# Patient Record
Sex: Female | Born: 1935
Health system: Southern US, Community
[De-identification: ages and names within clinical notes are randomized; demographics above are authoritative.]

## PROBLEM LIST (undated history)

## (undated) DIAGNOSIS — K219 Gastro-esophageal reflux disease without esophagitis: Secondary | ICD-10-CM

## (undated) DIAGNOSIS — C801 Malignant (primary) neoplasm, unspecified: Secondary | ICD-10-CM

## (undated) DIAGNOSIS — Z8619 Personal history of other infectious and parasitic diseases: Secondary | ICD-10-CM

## (undated) DIAGNOSIS — R0602 Shortness of breath: Secondary | ICD-10-CM

## (undated) DIAGNOSIS — Z8669 Personal history of other diseases of the nervous system and sense organs: Secondary | ICD-10-CM

## (undated) DIAGNOSIS — M2540 Effusion, unspecified joint: Secondary | ICD-10-CM

## (undated) DIAGNOSIS — M792 Neuralgia and neuritis, unspecified: Secondary | ICD-10-CM

## (undated) DIAGNOSIS — R51 Headache: Secondary | ICD-10-CM

## (undated) DIAGNOSIS — M2550 Pain in unspecified joint: Secondary | ICD-10-CM

## (undated) DIAGNOSIS — L853 Xerosis cutis: Secondary | ICD-10-CM

## (undated) DIAGNOSIS — M1990 Unspecified osteoarthritis, unspecified site: Secondary | ICD-10-CM

## (undated) DIAGNOSIS — G629 Polyneuropathy, unspecified: Secondary | ICD-10-CM

## (undated) DIAGNOSIS — T884XXA Failed or difficult intubation, initial encounter: Secondary | ICD-10-CM

## (undated) DIAGNOSIS — D649 Anemia, unspecified: Secondary | ICD-10-CM

## (undated) DIAGNOSIS — E079 Disorder of thyroid, unspecified: Secondary | ICD-10-CM

## (undated) DIAGNOSIS — Z9889 Other specified postprocedural states: Secondary | ICD-10-CM

## (undated) DIAGNOSIS — R112 Nausea with vomiting, unspecified: Secondary | ICD-10-CM

## (undated) DIAGNOSIS — I00 Rheumatic fever without heart involvement: Secondary | ICD-10-CM

## (undated) DIAGNOSIS — K589 Irritable bowel syndrome without diarrhea: Secondary | ICD-10-CM

## (undated) DIAGNOSIS — I341 Nonrheumatic mitral (valve) prolapse: Secondary | ICD-10-CM

## (undated) DIAGNOSIS — R011 Cardiac murmur, unspecified: Secondary | ICD-10-CM

## (undated) HISTORY — DX: Cardiac murmur, unspecified: R01.1

## (undated) HISTORY — DX: Malignant (primary) neoplasm, unspecified: C80.1

## (undated) HISTORY — DX: Unspecified osteoarthritis, unspecified site: M19.90

## (undated) HISTORY — DX: Disorder of thyroid, unspecified: E07.9

## (undated) HISTORY — PX: TRAPEZIUM RESECTION: SHX2576

## (undated) HISTORY — DX: Gastro-esophageal reflux disease without esophagitis: K21.9

## (undated) HISTORY — PX: ESOPHAGOGASTRODUODENOSCOPY: SHX1529

## (undated) HISTORY — PX: COLONOSCOPY: SHX174

## (undated) HISTORY — PX: REVISION TOTAL KNEE ARTHROPLASTY: SUR1280

## (undated) HISTORY — PX: TUBAL LIGATION: SHX77

## (undated) HISTORY — PX: MANDIBLE FRACTURE SURGERY: SHX706

## (undated) HISTORY — PX: BREAST SURGERY: SHX581

---
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
## 1960-08-03 HISTORY — PX: CYSTECTOMY: SUR359

## 1960-08-03 HISTORY — PX: OTHER SURGICAL HISTORY: SHX169

## 1973-08-03 HISTORY — PX: OTHER SURGICAL HISTORY: SHX169

## 1973-08-03 HISTORY — PX: BREAST EXCISIONAL BIOPSY: SUR124

## 1982-08-03 HISTORY — PX: TEMPOROMANDIBULAR JOINT SURGERY: SHX35

## 1982-08-03 HISTORY — PX: DILATION AND CURETTAGE OF UTERUS: SHX78

## 1991-08-04 HISTORY — PX: SIGMOIDOSCOPY: SUR1295

## 1997-08-03 HISTORY — PX: OTHER SURGICAL HISTORY: SHX169

## 1998-02-18 ENCOUNTER — Other Ambulatory Visit: Admission: RE | Admit: 1998-02-18 | Discharge: 1998-02-18 | Payer: Self-pay | Admitting: *Deleted

## 1999-02-25 ENCOUNTER — Ambulatory Visit (HOSPITAL_BASED_OUTPATIENT_CLINIC_OR_DEPARTMENT_OTHER): Admission: RE | Admit: 1999-02-25 | Discharge: 1999-02-25 | Payer: Self-pay | Admitting: Orthopedic Surgery

## 2000-07-20 ENCOUNTER — Encounter: Payer: Self-pay | Admitting: *Deleted

## 2000-07-20 ENCOUNTER — Ambulatory Visit (HOSPITAL_COMMUNITY): Admission: RE | Admit: 2000-07-20 | Discharge: 2000-07-20 | Payer: Self-pay | Admitting: *Deleted

## 2000-08-03 HISTORY — PX: OTHER SURGICAL HISTORY: SHX169

## 2001-03-10 ENCOUNTER — Other Ambulatory Visit: Admission: RE | Admit: 2001-03-10 | Discharge: 2001-03-10 | Payer: Self-pay | Admitting: Family Medicine

## 2002-09-11 ENCOUNTER — Ambulatory Visit (HOSPITAL_COMMUNITY): Admission: RE | Admit: 2002-09-11 | Discharge: 2002-09-11 | Payer: Self-pay | Admitting: Gastroenterology

## 2002-09-11 ENCOUNTER — Encounter: Payer: Self-pay | Admitting: Gastroenterology

## 2003-06-13 ENCOUNTER — Ambulatory Visit (HOSPITAL_COMMUNITY): Admission: RE | Admit: 2003-06-13 | Discharge: 2003-06-13 | Payer: Self-pay | Admitting: Orthopedic Surgery

## 2003-06-21 ENCOUNTER — Ambulatory Visit (HOSPITAL_BASED_OUTPATIENT_CLINIC_OR_DEPARTMENT_OTHER): Admission: RE | Admit: 2003-06-21 | Discharge: 2003-06-21 | Payer: Self-pay | Admitting: Orthopedic Surgery

## 2004-03-17 ENCOUNTER — Ambulatory Visit (HOSPITAL_COMMUNITY): Admission: RE | Admit: 2004-03-17 | Discharge: 2004-03-17 | Payer: Self-pay | Admitting: Cardiovascular Disease

## 2004-06-02 ENCOUNTER — Ambulatory Visit (HOSPITAL_COMMUNITY): Admission: RE | Admit: 2004-06-02 | Discharge: 2004-06-02 | Payer: Self-pay | Admitting: Gastroenterology

## 2004-07-08 ENCOUNTER — Ambulatory Visit (HOSPITAL_COMMUNITY): Admission: RE | Admit: 2004-07-08 | Discharge: 2004-07-08 | Payer: Self-pay | Admitting: Gastroenterology

## 2004-07-08 ENCOUNTER — Ambulatory Visit: Payer: Self-pay | Admitting: Gastroenterology

## 2004-08-03 HISTORY — PX: THYROIDECTOMY: SHX17

## 2004-10-29 ENCOUNTER — Ambulatory Visit: Payer: Self-pay | Admitting: Otolaryngology

## 2004-11-20 ENCOUNTER — Ambulatory Visit: Payer: Self-pay | Admitting: Otolaryngology

## 2005-07-08 ENCOUNTER — Ambulatory Visit: Payer: Self-pay | Admitting: Gastroenterology

## 2006-08-03 HISTORY — PX: SQUAMOUS CELL CARCINOMA EXCISION: SHX2433

## 2006-08-03 HISTORY — PX: OTHER SURGICAL HISTORY: SHX169

## 2006-11-10 ENCOUNTER — Encounter: Payer: Self-pay | Admitting: Orthopaedic Surgery

## 2006-12-02 ENCOUNTER — Encounter: Payer: Self-pay | Admitting: Orthopaedic Surgery

## 2007-03-07 ENCOUNTER — Other Ambulatory Visit: Payer: Self-pay

## 2007-03-07 ENCOUNTER — Ambulatory Visit: Payer: Self-pay | Admitting: Unknown Physician Specialty

## 2007-03-10 ENCOUNTER — Ambulatory Visit: Payer: Self-pay | Admitting: Unknown Physician Specialty

## 2007-07-07 ENCOUNTER — Ambulatory Visit: Payer: Self-pay | Admitting: Otolaryngology

## 2007-08-04 HISTORY — PX: ABDOMINAL HYSTERECTOMY: SHX81

## 2007-08-08 ENCOUNTER — Ambulatory Visit: Payer: Self-pay | Admitting: Unknown Physician Specialty

## 2007-08-17 ENCOUNTER — Inpatient Hospital Stay: Payer: Self-pay | Admitting: Unknown Physician Specialty

## 2008-08-03 HISTORY — PX: BREAST EXCISIONAL BIOPSY: SUR124

## 2008-09-21 ENCOUNTER — Encounter: Admission: RE | Admit: 2008-09-21 | Discharge: 2008-09-21 | Payer: Self-pay | Admitting: General Surgery

## 2008-09-24 ENCOUNTER — Encounter (INDEPENDENT_AMBULATORY_CARE_PROVIDER_SITE_OTHER): Payer: Self-pay | Admitting: General Surgery

## 2008-09-24 ENCOUNTER — Ambulatory Visit (HOSPITAL_COMMUNITY): Admission: RE | Admit: 2008-09-24 | Discharge: 2008-09-24 | Payer: Self-pay | Admitting: General Surgery

## 2009-08-21 ENCOUNTER — Encounter: Payer: Self-pay | Admitting: Family Medicine

## 2009-09-03 ENCOUNTER — Encounter: Payer: Self-pay | Admitting: Family Medicine

## 2010-11-18 LAB — BASIC METABOLIC PANEL
BUN: 16 mg/dL (ref 6–23)
Calcium: 9.6 mg/dL (ref 8.4–10.5)
GFR calc non Af Amer: >60 mL/min (ref >60)
Glucose, Bld: 78 mg/dL (ref 70–99)
Sodium: 137 mEq/L (ref 135–145)

## 2010-11-18 LAB — CBC
Hemoglobin: 13.1 g/dL (ref 12.0–15.0)
Platelets: 193 10*3/uL (ref 150–400)
RDW: 12.6 % (ref 11.5–15.5)
WBC: 4.4 10*3/uL (ref 4.0–10.5)

## 2010-11-18 LAB — DIFFERENTIAL
Basophils Absolute: 0 10*3/uL (ref 0.0–0.1)
Lymphocytes Relative: 32 % (ref 12–46)
Lymphs Abs: 1.4 10*3/uL (ref 0.7–4.0)
Neutro Abs: 2.6 10*3/uL (ref 1.7–7.7)

## 2010-12-16 NOTE — Op Note (Signed)
NAME:  Wendy Patton, Wendy Patton              ACCOUNT NO.:  [id number]   MEDICAL RECORD NO.:  [id number]          PATIENT TYPE:  AMB   LOCATION:  DAY                          FACILITY:  [facility]   PHYSICIAN:  Lennie Muckle, MD      DATE OF BIRTH:  January 13, 1936   DATE OF PROCEDURE:  09/24/2008  DATE OF DISCHARGE:                               OPERATIVE REPORT   PREOPERATIVE DIAGNOSES:  Bloody nipple discharge and palpable mass, left  breast.   POSTOPERATIVE DIAGNOSES:  Bloody nipple discharge and palpable mass,  left breast.   PROCEDURE:  Excisional biopsy, left breast x2.   ASSISTANT:  None.    IV sedation and local anesthetic.   INDICATIONS FOR PROCEDURE:  Wendy Patton is a 75 year old female who had  spontaneous bloody nipple discharge on her left breast.  Imaging did not  reveal a mass, however ductogram did reveal a filling defect in the left  breast approximately 1 to 2.5 cm below the nipple.  She had a presumed  papilloma.  I discussed with her the low chance of having an associated  carcinoma.  We had also talked about performing excisional biopsy of the  palpable mass in the left breast.  Informed consent was obtained prior  to procedure.   DETAILS OF THE PROCEDURE:  Wendy Patton was identified in the preoperative  holding area.  Due to her previous surgeries for her TMJ, she was a  difficult airway.  We decided to perform the surgery under IV sedation  and local anesthetic.  She was taken to the operating room.  Once in the  supine position, IV sedation was initiated.  The left breast was prepped  and draped in the usual sterile fashion.  A time-out procedure  indicating patient and procedure was performed.  I used a solution of 20  mL of 1% lidocaine and 0.25 Marcaine with epinephrine.  This was used to  anesthetize the skin around the left nipple.  I was able to elicit the  spontaneous discharge around 4 to 5 o'clock.  I placed an incision  around the areola in this vicinity.  Using  electrocautery, I performed  excisional biopsy.  The specimen was marked with long lateral, short  superior, and 2 single short deep.  I then performed excisional biopsy  of the deeper mass at around 6 o'clock.  This was also oriented long  lateral, short superior.  The area was irrigated.  A small amount of  oozing was controlled with electrocautery.  Final inspection did not  reveal any bleeding.  I then closed the defect in layers using 3-0  Vicryl in the deeper tissues and the dermis, 4-0 Monocryl on  the skin.  Steri-Strips were placed as final dressing, __________  fluffs, and the patient was wrapped in an Ace wrap.  She was then taken  to the postanesthesia care unit in stable condition.  I will call her  with her final pathology once I have this.      Lennie Muckle, MD  Electronically Signed     ALA/MEDQ  D:  09/24/2008  T:  09/24/2008  Job:  [id number]   cc:   Lorie Phenix  Fax: [phone]   Chelsea Primus, M.D.

## 2010-12-19 NOTE — Op Note (Signed)
NAME:  Wendy Patton, Wendy Patton                        ACCOUNT NO.:  [id number]   MEDICAL RECORD NO.:  [id number]                   PATIENT TYPE:  AMB   LOCATION:  DSC                                  FACILITY:  [facility]   PHYSICIAN:  Katy Fitch. Naaman Plummer., M.D.          DATE OF BIRTH:  1936-07-16   DATE OF PROCEDURE:  06/21/2003  DATE OF DISCHARGE:                                 OPERATIVE REPORT   PREOPERATIVE DIAGNOSIS:  1. Eaton Stage IV CMC arthritis, right thumb carpometacarpal joint.  2. Chronic type 1Z collapse, right thumb with 40 degrees hyperextension at     right thumb metacarpophalangeal joint.  3. Positive bone scan with uptake overlying proximal carpal row with     clinical evidence of internal derangement of radiocarpal or ulnocarpal     articulation.   POSTOPERATIVE DIAGNOSES:  1. Eaton Stage IV CMC arthritis, right thumb carpometacarpal joint.  2. Chronic type 1Z collapse, right thumb with 40 degrees hyperextension at     right thumb metacarpophalangeal joint.  3. Positive bone scan with uptake overlying proximal carpal row with     clinical evidence of internal derangement of radiocarpal or ulnocarpal     articulation.  4. Arthroscopically proven chondromalacia of lunate and triquetrum, as well     as synovitis within the radiocarpal and ulnocarpal joints.   PROCEDURE:  1. Diagnostic arthroscopy, right wrist, confirming synovitis and grade 2     chondromalacia of radiolunate articulation and ulnar-sided synovitis.  2. Right trapezium excision and synovectomy of carpometacarpal joint.  3. Abductor pollicis longus suspensionplasty in the manner of Thompson.  4. Volar plate plication of right thumb metacarpophalangeal joint with     excision of ulnar sesamoid, and volar plate capsulodesis.   SURGEON:  Katy Fitch. Sypher, M.D.   ASSISTANT:  Marveen Reeks. Dasnoit, P.A.-C.   ANESTHESIA:  General via LMA.   SUPERVISING ANESTHESIOLOGIST:  Maren Beach, M.D.   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  She is status post reconstruction of her  left thumb CMC joint with trapezium excision and soft tissue  suspensionplasty in July 2000.  She is quite pleased with the pain relief  she experienced on the left side and requested a similar surgery on the  right.   During her preoperative screening examination, she was noted to have  significant wrist pain, loss of full palmar flexion of her wrist, and  advanced CMC arthrosis with a type 1Z collapse of her thumb.   A bone scan was obtained, looking for signs of osteoarthrosis of the  radiocarpal articulation, ulnocarpal articulation and a possible hook of  hamate fracture.  Her bone scan was remarkable for extreme uptake in the  region of her carpometacarpal joint at a site of known arthritis, and rather  remarkable uptake in the region  of her radiolunate articulation.   We recommended diagnostic arthroscopy of her wrist at this time prior to  proceeding with [facility] reconstruction.   DESCRIPTION OF PROCEDURE:  Wendy Patton was brought to the operating room  and placed in the supine position upon the operating table.  Following  induction of general anesthesia by LMA, the right arm was prepped with  Betadine soap solution and sterilely draped.  Ancef 1 g was administered as  an IV prophylactic antibiotic.  The right arm was exsanguinated with an  Esmarch bandage and arterial tourniquet on the proximal brachium inflated to  220 mmHg.   The procedure commenced with distraction of the right wrist in a tower  designed for wrist arthroscopy, utilizing metal finger traps on the index  and long fingers, and counter-traction on the forearm. Ten pounds traction  was applied.  The wrist joint was sounded with an 18 gauge needle, distended  with saline.  A 2.9 mm arthroscope was placed  with blunt technique through a  3-4 dorsal portal.   Diagnostic arthroscopy of the radiocarpal and ulnocarpal joints revealed  intact hyaline articular cartilage on the scaphoid, the scaphoid facet of  the radius, the lunate facet of the radius and most of the lunate. There was  a grade 2 chondromalacia on the ulnar aspect of the lunate. The triangular  fibrocartilage was intact. The volar radiocarpal ligaments were intact, the  ulnocarpal ligaments were intact, and moderate synovitis was noted along the  ulnocarpal articulation and peripheral triangular fibrocartilage.  There was  a large flap of degenerative cartilage noted on the dorsal surface of the  lunate, adjacent to the capsule reflection.   A 6-R portal was created and a 2.9 mm suction shaver was placed, followed by  debridement of the synovitis in the ulnocarpal joint, and debridement of the  lunate chondromalacia.  The scope was then placed in the 6-R portal and  extensive debridement of dorsal synovitis was accomplished.   It appeared that Ms. Staheli was beginning to develop early radiolunate  arthritis, and had significant synovitis as part of her painful  presentation.   The arthroscopic equipment was removed, followed by initiation of the thumb  reconstruction.  A curvilinear incision was fashioned in the manner of  Wagner, followed by exposure of the interval between the abductor pollicis  longus and extensor pollicis brevis tendons.  The radial sensory branches  were gently retracted.  Capsule of the [facility] joint was incised longitudinally  and the capsule reflected with the periosteum overlying the entire  trapezium.  The trapezium was morcellized with a 4 mm osteotome to remove  piecemeal with rongeurs.   A complete synovectomy of the [facility] joint was accomplished.  Drill holes were  created through the base of the thumb metacarpal at a 45 degrees angle, exiting the mid-portion of the articular surface proximally, from  the dorsal  surface of the metacarpal, 1 cm distal to the proximal articular surface.  The drill hole was enlarged with sequential drilling to 5/32nd diameter.  A  second drill hole was created across the base of the index metacarpal,  distal to the facet for articulation of the thumb metacarpal. This was  enlarged to 5/32nd with sequential drilling.  Then 80% of the dorsal slip of  the abductor pollicis longus was harvested at the musculotendinous junction,  and brought distally with a Carroll tendon passing forceps.  The tendon was  then placed through the drill hole at the base of the  metacarpal, from  dorsal distal to proximal palmar.  This was then brought through the hole in  the index metacarpal and approximately tensioned, creating a very  satisfactory suspensionplasty. The tendon tail was woven into the extensor  carpi radialis brevis with a 3 pass Pulvertaft weave, and corner sutures of  3-0 Ethibond.   The MP joint was noted to have 40 degrees of residual hyperextension,  following suspensionplasty. Therefore, a volar, Brunner zigzag incision was  used to expose the flexor sheath. The A1 pulley was released, and the  proximal synovium dissected.  The radial digital nerve was carefully  protected, as a rectangular block of the volar plate was excised, and the  ulnar sesamoid was excised.   A 2 mm absorbable anchor was placed in the neck of the metacarpal, after  drilling with a 1.8 mm pilot hole.  The anchor was placed without  difficulty, followed by repair of the distal volar plate to the anchor  point, creating a plication of the volar plate with a mattress suture of 2-0  fiber wire, followed by closure of the rectangular defect in the volar  plate, advancing the volar plate proximally with a second mattress suture of  2-0 fiber wire. The MP joint was stabilized in 20 degrees of flexion with a  nice, firm endpoint.   The wounds were then irrigated and closed with  intradermal 3-0 Prolene and  Steri-Strips.  Marcaine was infiltrated for postoperative analgesia. There  were no apparent complications.  Ms. Kalp tolerated surgery and anesthesia  well.  The patient was transferred to the recovery room with stable signs.   [facility] will [facility] for observation of her  vital signs and appropriate analgesics in the form of IV PCA morphine and IV  Dilaudid. She will be given Ancef 1 g IV q.8h. x24 hours, as a prophylactic  antibiotic.                                               Katy Fitch Naaman Plummer., M.D.    RVS/MEDQ  D:  06/21/2003  T:  06/22/2003  Job:  [id number]

## 2011-03-27 ENCOUNTER — Ambulatory Visit: Payer: Self-pay | Admitting: Family Medicine

## 2011-06-11 ENCOUNTER — Ambulatory Visit (INDEPENDENT_AMBULATORY_CARE_PROVIDER_SITE_OTHER): Payer: Self-pay | Admitting: General Surgery

## 2011-07-01 ENCOUNTER — Encounter (INDEPENDENT_AMBULATORY_CARE_PROVIDER_SITE_OTHER): Payer: Self-pay | Admitting: General Surgery

## 2011-07-01 ENCOUNTER — Ambulatory Visit (INDEPENDENT_AMBULATORY_CARE_PROVIDER_SITE_OTHER): Payer: Medicare Other | Admitting: General Surgery

## 2011-07-01 VITALS — BP 128/84 | HR 72 | Temp 97.2°F | Resp 16 | Ht 69.0 in | Wt 138.0 lb

## 2011-07-01 DIAGNOSIS — R1032 Left lower quadrant pain: Secondary | ICD-10-CM

## 2011-07-01 NOTE — Progress Notes (Signed)
Subjective:    Wendy Patton is a 75 y.o. female who was referred by [facility] for evaluation of left groin pain. Symptoms were first noted 2 years ago. Symptoms did not start at work. Pain is sharp, intermittent, no radiation, lasting 3 hours. Lump is not seen. Patient has no symptoms of  chronic constipation, chronic cough, difficulty urinating. Patient does not have previous hx of groin surgery.   Past Medical History  Diagnosis Date  . Arthritis   . Cancer   . COPD (chronic obstructive pulmonary disease)   . GERD (gastroesophageal reflux disease)   . Heart murmur   . Osteopenia   . Thyroid disease      The following portions of the patient's history were reviewed and updated as appropriate: allergies, current medications, past family history, past medical history, past social history, past surgical history and problem list.  Review of Systems Pertinent items are noted in HPI.     Objective:    General :  alert and cooperative  Neck:  no asymmetry, masses, or scars  Lungs:  clear to auscultation bilaterally  Heart:  regular rate and rhythm, S1, S2 normal, no murmur, click, rub or gallop  Abdomen: soft, non-tender; bowel sounds normal; no masses,  no organomegaly  Genitalia:   No scarring.  Hernia is not palpable on bilateral side with Valsalva.     Assessment:    Left groin pain.No evidence of inguinal or femoral hernia on exam with valsalva.     Plan:    Since I do not appreciate any hernia on exam.  This certainly could be a small inguinal or femoral hernia but since I do not feel one on exam,I recommended dynamic [place] with valsalva.  If there is any evidence of hernia on this exam, I would consider groin exploration.  She will f/u in 3 weeks after [place]. Follow up: 3 weeks.

## 2011-07-08 ENCOUNTER — Other Ambulatory Visit (INDEPENDENT_AMBULATORY_CARE_PROVIDER_SITE_OTHER): Payer: Self-pay

## 2011-07-08 DIAGNOSIS — R1030 Lower abdominal pain, unspecified: Secondary | ICD-10-CM

## 2011-07-13 ENCOUNTER — Ambulatory Visit
Admission: RE | Admit: 2011-07-13 | Discharge: 2011-07-13 | Disposition: A | Discharge status: Home / Self Care | Payer: Medicare Other | Source: Ambulatory Visit | Attending: General Surgery | Admitting: General Surgery

## 2011-07-13 DIAGNOSIS — R1030 Lower abdominal pain, unspecified: Secondary | ICD-10-CM

## 2011-07-22 ENCOUNTER — Telehealth (INDEPENDENT_AMBULATORY_CARE_PROVIDER_SITE_OTHER): Payer: Self-pay

## 2011-07-22 NOTE — Telephone Encounter (Signed)
Patient aware of U/S results, patient will follow up on prn basis.

## 2011-07-24 ENCOUNTER — Encounter (INDEPENDENT_AMBULATORY_CARE_PROVIDER_SITE_OTHER): Payer: Medicare Other | Admitting: General Surgery

## 2012-06-27 ENCOUNTER — Ambulatory Visit: Payer: Self-pay | Admitting: Neurology

## 2012-06-27 LAB — CREATININE, SERUM
Creatinine: 0.9 mg/dL (ref 0.60–1.30)
EGFR (African American): >60

## 2012-09-09 ENCOUNTER — Ambulatory Visit: Payer: Self-pay

## 2013-08-03 HISTORY — PX: BREAST EXCISIONAL BIOPSY: SUR124

## 2013-08-16 ENCOUNTER — Other Ambulatory Visit (HOSPITAL_COMMUNITY): Payer: Medicare Other

## 2013-08-28 ENCOUNTER — Other Ambulatory Visit: Payer: Self-pay | Admitting: Orthopaedic Surgery

## 2013-08-28 ENCOUNTER — Encounter (HOSPITAL_COMMUNITY): Payer: Self-pay | Admitting: Pharmacy Technician

## 2013-09-01 ENCOUNTER — Encounter (HOSPITAL_COMMUNITY): Payer: Self-pay

## 2013-09-01 ENCOUNTER — Other Ambulatory Visit: Payer: Self-pay | Admitting: Orthopaedic Surgery

## 2013-09-01 ENCOUNTER — Encounter (HOSPITAL_COMMUNITY)
Admission: RE | Admit: 2013-09-01 | Discharge: 2013-09-01 | Disposition: A | Discharge status: Home / Self Care | Payer: Medicare HMO | Source: Ambulatory Visit | Attending: Orthopaedic Surgery | Admitting: Orthopaedic Surgery

## 2013-09-01 ENCOUNTER — Ambulatory Visit (HOSPITAL_COMMUNITY)
Admission: RE | Admit: 2013-09-01 | Discharge: 2013-09-01 | Disposition: A | Discharge status: Home / Self Care | Payer: Medicare HMO | Source: Ambulatory Visit | Attending: Orthopaedic Surgery | Admitting: Orthopaedic Surgery

## 2013-09-01 DIAGNOSIS — J449 Chronic obstructive pulmonary disease, unspecified: Secondary | ICD-10-CM | POA: Insufficient documentation

## 2013-09-01 DIAGNOSIS — Z01810 Encounter for preprocedural cardiovascular examination: Secondary | ICD-10-CM | POA: Insufficient documentation

## 2013-09-01 DIAGNOSIS — Z01818 Encounter for other preprocedural examination: Secondary | ICD-10-CM | POA: Insufficient documentation

## 2013-09-01 DIAGNOSIS — J4489 Other specified chronic obstructive pulmonary disease: Secondary | ICD-10-CM | POA: Insufficient documentation

## 2013-09-01 HISTORY — DX: Personal history of other infectious and parasitic diseases: Z86.19

## 2013-09-01 HISTORY — DX: Effusion, unspecified joint: M25.40

## 2013-09-01 HISTORY — DX: Other specified postprocedural states: R11.2

## 2013-09-01 HISTORY — DX: Failed or difficult intubation, initial encounter: T88.4XXA

## 2013-09-01 HISTORY — DX: Xerosis cutis: L85.3

## 2013-09-01 HISTORY — DX: Personal history of other diseases of the nervous system and sense organs: Z86.69

## 2013-09-01 HISTORY — DX: Neuralgia and neuritis, unspecified: M79.2

## 2013-09-01 HISTORY — DX: Anemia, unspecified: D64.9

## 2013-09-01 HISTORY — DX: Rheumatic fever without heart involvement: I00

## 2013-09-01 HISTORY — DX: Pain in unspecified joint: M25.50

## 2013-09-01 HISTORY — DX: Polyneuropathy, unspecified: G62.9

## 2013-09-01 HISTORY — DX: Gastro-esophageal reflux disease without esophagitis: K21.9

## 2013-09-01 HISTORY — DX: Other specified postprocedural states: Z98.890

## 2013-09-01 HISTORY — DX: Irritable bowel syndrome, unspecified: K58.9

## 2013-09-01 HISTORY — DX: Shortness of breath: R06.02

## 2013-09-01 LAB — BASIC METABOLIC PANEL
BUN: 24 mg/dL — ABNORMAL HIGH (ref 6–23)
CALCIUM: 9.3 mg/dL (ref 8.4–10.5)
CO2: 27 mEq/L (ref 19–32)
Chloride: 98 mEq/L (ref 96–112)
Creatinine, Ser: 0.92 mg/dL (ref 0.50–1.10)
GFR calc Af Amer: 69 mL/min — ABNORMAL LOW (ref >90)
GFR, EST NON AFRICAN AMERICAN: 59 mL/min — AB (ref >90)
Glucose, Bld: 77 mg/dL (ref 70–99)
Potassium: 3.9 mEq/L (ref 3.7–5.3)
Sodium: 137 mEq/L (ref 137–147)

## 2013-09-01 LAB — CBC WITH DIFFERENTIAL/PLATELET
Basophils Absolute: 0 K/uL (ref 0.0–0.1)
Basophils Relative: 0 % (ref 0–1)
Eosinophils Absolute: 0.2 K/uL (ref 0.0–0.7)
Eosinophils Relative: 3 % (ref 0–5)
HCT: 39.8 % (ref 36.0–46.0)
Hemoglobin: 13.6 g/dL (ref 12.0–15.0)
Lymphocytes Relative: 32 % (ref 12–46)
Lymphs Abs: 1.8 K/uL (ref 0.7–4.0)
MCH: 29 pg (ref 26.0–34.0)
MCHC: 34.2 g/dL (ref 30.0–36.0)
MCV: 84.9 fL (ref 78.0–100.0)
Monocytes Absolute: 0.4 K/uL (ref 0.1–1.0)
Monocytes Relative: 7 % (ref 3–12)
Neutro Abs: 3.2 K/uL (ref 1.7–7.7)
Neutrophils Relative %: 58 % (ref 43–77)
Platelets: 233 K/uL (ref 150–400)
RBC: 4.69 MIL/uL (ref 3.87–5.11)
RDW: 14.3 % (ref 11.5–15.5)
WBC: 5.6 K/uL (ref 4.0–10.5)

## 2013-09-01 LAB — SURGICAL PCR SCREEN
MRSA, PCR: NEGATIVE
Staphylococcus aureus: POSITIVE — AB

## 2013-09-01 LAB — URINALYSIS, ROUTINE W REFLEX MICROSCOPIC
Bilirubin Urine: NEGATIVE
Glucose, UA: NEGATIVE mg/dL
Hgb urine dipstick: NEGATIVE
Ketones, ur: NEGATIVE mg/dL
Leukocytes, UA: NEGATIVE
Nitrite: NEGATIVE
Protein, ur: NEGATIVE mg/dL
Specific Gravity, Urine: 1.019 (ref 1.005–1.030)
Urobilinogen, UA: 0.2 mg/dL (ref 0.0–1.0)
pH: 6.5 (ref 5.0–8.0)

## 2013-09-01 LAB — APTT: aPTT: 34 s (ref 24–37)

## 2013-09-01 LAB — TYPE AND SCREEN
ABO/RH(D): A NEG
Antibody Screen: NEGATIVE

## 2013-09-01 LAB — PROTIME-INR
INR: 1.05 (ref 0.00–1.49)
Prothrombin Time: 13.5 s (ref 11.6–15.2)

## 2013-09-01 LAB — ABO/RH: ABO/RH(D): A NEG

## 2013-09-01 NOTE — Anesthesia Preprocedure Evaluation (Addendum)
Anesthesia Evaluation  Patient identified by MRN, date of birth, ID band Patient awake    Reviewed: Allergy & Precautions, H&P , NPO status , Patient's Chart, lab work & pertinent test results  History of Anesthesia Complications (+) PONV, DIFFICULT AIRWAY and history of anesthetic complications  Airway Mallampati: III    Mouth opening: Limited Mouth Opening  Dental  (+) Teeth Intact,    Pulmonary shortness of breath, COPD breath sounds clear to auscultation        Cardiovascular Rhythm:Regular Rate:Normal     Neuro/Psych    GI/Hepatic GERD-  Medicated and Controlled,  Endo/Other    Renal/GU      Musculoskeletal   Abdominal   Peds  Hematology   Anesthesia Other Findings   Reproductive/Obstetrics                         Anesthesia Physical Anesthesia Plan  ASA: III  Anesthesia Plan: Spinal   Post-op Pain Management:    Induction:   Airway Management Planned: Natural Airway  Additional Equipment:   Intra-op Plan:   Post-operative Plan:   Informed Consent: I have reviewed the patients History and Physical, chart, labs and discussed the procedure including the risks, benefits and alternatives for the proposed anesthesia with the patient or authorized representative who has indicated his/her understanding and acceptance.   Dental advisory given  Plan Discussed with: CRNA  Anesthesia Plan Comments: (See my anesthesia note regarding reflux and potential difficult intubation issues.  Myra Gianotti, PA-C)       Anesthesia Quick Evaluation

## 2013-09-01 NOTE — Progress Notes (Addendum)
Anesthesia PAT Evaluation:  Patient is a 78 year old female scheduled for left THA, anterior approach on 09/12/13 by Dr. Rhona Raider.  Case is posted for choice anesthesia.  History includes non-smoker, COPD, Rheumatic fever with history of murmur (no problem), GERD, anemia, IBS, skin cancer (SCC), chronic DOE, thyroidectomy with secondary hypothyroidism, post-operative N/V.  She has had at least six TMJ surgeries with replacement of fossa and condyles. She numbness along her lower mouth/chin and has muscle weakness on the left lower mouth/chin.  She is a potential for DIFFICULT INTUBATION because she has very limited mouth opening (approximately 1 1/2 finger breadths).  She may have had an awake intubation in the past--she's note sure.  She also has issues with laryngopharyngeal reflux. She is asymptomatic of her reflux.  She was actually initially being treated for what was thought to be asthma but later was found to be misdiagnosed. She is now on Aciphex and has to sleep with her bed elevated four inches. GI is Dr. Roselyn Reef in [place].  ENT is with [facility].  PCP is Dr. Margarita Rana with [facility].    She denies chest pain, SOB at rest, edema, significant palpitations, syncope, history of CHF.  She has not had an echo in years.  Her murmur is not often heard, and hasn't caused her any known difficulty. She does have chronic DOE with activities such as walking up stairs, but this is stable.  Exam shows a pleasant female in NAD.  Lungs clear, heart RRR.  I did not appreciated a murmur.  No carotid bruits or LE edema noted.  Limited mouth opening as above.  EKG on 09/01/13 showed NSR with occasional PACs, right BBB.  Preoperative labs noted.   CXR on 09/01/13 showed no acute abnormality noted.  I did discuss that due to her limited mouth opening there was a possibility of an awake intubation.  I would at least anticipate that a glidescope or fiberoptic scope would be needed.  LMA could be considered but  may not be the best option with herl laryngopharyngeal reflux.  She will take Aciphex that morning.  We also discussed the possibility of spinal anesthesia.  She is a first case (she requested due to concerns of hypoglycemia). She is aware if an awake intubation is needed then her case may start slightly later than the other first cases.  She feels comfortable with doing whatever method her anesthesiologist feels is the safest.  I also reviewed with anesthesiologist Dr. Tobias Alexander.  Definitive anesthesia plan on the day of surgery after evaluation by her assigned anesthesiologist.  I have requested her last anesthesia records from [facility] and [facility]. (Update: On 08/16/07 she had a hysterectomy at [facility]. Rapid sequence oral intubation was performed with successful intubation with a 6.0 ETT after two attempts using a Miller 3 blade, stylet, and tooth guard. Last anesthesia records from [facility] and [facility] are also on her chart. Staff at [facility] state that she was not intubated for her colonoscopy there.)  George Hugh [facility] Short Stay Center/Anesthesiology Phone [phone] 09/01/2013 5:45 PM

## 2013-09-01 NOTE — Progress Notes (Signed)
Saw a cardiologist about 73yrs ago  Stress test done about 22yrs ago  Denies ever having an echo or heart cath  Medical MD is Dr.Nancy Venia Minks  Denies Ekg or CXR in  Past yr

## 2013-09-01 NOTE — Pre-Procedure Instructions (Signed)
JANAN BOGIE  09/01/2013   Your procedure is scheduled on:  Tues, Feb 10 @ 7:30 AM  Report to [facility] Stay Entrance A  at 5:30 AM.  Call this number if you have problems the morning of surgery: [phone]   Remember:   Do not eat food or drink liquids after midnight.   Take these medicines the morning of surgery with A SIP OF WATER: Aciphex,Gabapentin(Neurontin)              Stop taking your Vit E. No Goody's,BC's,Aleve,Aspirin,Ibuprofen,Fish Oil,or any Herbal Medications   Do not wear jewelry, make-up or nail polish.  Do not wear lotions, powders, or perfumes. You may wear deodorant.  Do not shave 48 hours prior to surgery.   Do not bring valuables to the hospital.  [facility] is not responsible                  for any belongings or valuables.               Contacts, dentures or bridgework may not be worn into surgery.  Leave suitcase in the car. After surgery it may be brought to your room.  For patients admitted to the hospital, discharge time is determined by your                treatment team.                Special Instructions: Shower using CHG 2 nights before surgery and the night before surgery.  If you shower the day of surgery use CHG.  Use special wash - you have one bottle of CHG for all showers.  You should use approximately 1/3 of the bottle for each shower.   Please read over the following fact sheets that you were given: Pain Booklet, Coughing and Deep Breathing, Blood Transfusion Information, MRSA Information and Surgical Site Infection Prevention

## 2013-09-01 NOTE — Progress Notes (Addendum)
Larynx is irritated and acid is spilling into the lungs which then causes her to have trouble getting air

## 2013-09-05 ENCOUNTER — Other Ambulatory Visit (HOSPITAL_COMMUNITY): Payer: Medicare Other

## 2013-09-06 NOTE — H&P (Signed)
TOTAL HIP ADMISSION H&P  Patient is admitted for left total hip arthroplasty.  Subjective:  Chief Complaint: left hip pain  HPI: Wendy Patton, 78 y.o. female, has a history of pain and functional disability in the left hip(s) due to arthritis and patient has failed non-surgical conservative treatments for greater than 12 weeks to include NSAID's and/or analgesics, corticosteriod injections, flexibility and strengthening excercises, supervised PT with diminished ADL's post treatment and activity modification.  Onset of symptoms was gradual starting 6 years ago with gradually worsening course since that time.The patient noted no past surgery on the left hip(s).  Patient currently rates pain in the left hip at 9 out of 10 with activity. Patient has night pain, worsening of pain with activity and weight bearing, trendelenberg gait, pain that interfers with activities of daily living and pain with passive range of motion. Patient has evidence of subchondral cysts, subchondral sclerosis, periarticular osteophytes and joint space narrowing by imaging studies. This condition presents safety issues increasing the risk of falls. This patient has had previous cortisone injection.  There is no current active infection.  There are no active problems to display for this patient.  Past Medical History  Diagnosis Date  . Arthritis   . COPD (chronic obstructive pulmonary disease)   . Heart murmur   . Osteopenia   . Thyroid disease   . GERD (gastroesophageal reflux disease)     takes Aciphex daily  . PONV (postoperative nausea and vomiting)     TMJ surgery several times  . Rheumatic fever     as a child  . Shortness of breath     with exertion  . History of migraine     last one Aug 2014 and takes Imitrex daily as needed  . Neuralgia   . Neuropathy     takes gabapentin daily  . Joint pain   . Joint swelling   . IBS (irritable bowel syndrome)   . Anemia     after jaw surgery was on iron for  short period of time  . Cancer     squamous cell  . History of staph infection   . Dry skin   . Laryngopharyngeal reflux   . Difficult intubation     potential for difficult airway due to limited mouth opening    Past Surgical History  Procedure Laterality Date  . Tubal ligation    . Cystectomy  1962    vaginal cyst removal  . Cesarean section  1967  . Breat excision  1975    benign breast tumor excision  . Cervial polyps  1999  . Trapezium resection  2000, 2004    removal right & left hand trapezium  . Granuloma  2002    chin granuloma removed  . Squamous cell carcinoma excision  2008    left ankle/skin graft  . Uterine polyps  2008  . Abdominal hysterectomy  2009  . Breast surgery  1975, 2010    benign breast tumor  . Dilation and curettage of uterus  1984  . Thyroidectomy  2006    left  . Mandible fracture surgery  1980, 1984, 1986, 1993, 2002, 2004    prosthesis, condyles implants, fossa replaced  . Tonsillectomy and adenoidectomy  1944  . Vaginal cyst removed  1962  . Temporomandibular joint surgery  1984  . Sigmoidoscopy  1993  . Colonoscopy    . Esophagogastroduodenoscopy      No prescriptions prior to admission   Allergies  Allergen  Reactions  . Avelox [Moxifloxacin Hcl In Nacl]     C-diff  . Entex   . Nitrofurantoin Monohyd Macro   . Percocet [Oxycodone-Acetaminophen]   . Pulmicort Turbuhaler [Budesonide]   . Tavist-D [[employer]ber[employer]ons Dayhist-D]   . Tizanidine   . Witch Hazel   . Amoxicillin Rash  . Chlorhexidine Rash  . Clindamycin/Lincomycin Rash  . Doxycycline Rash  . Hydrocodone Rash    History  Substance Use Topics  . Smoking status: Never Smoker   . Smokeless tobacco: Not on file  . Alcohol Use: No    No family history on file.   Review of Systems  Constitutional: Negative.   HENT: Negative.   Eyes: Negative.   Respiratory: Negative.   Cardiovascular: Negative.   Gastrointestinal: Negative.   Genitourinary: Negative.    Musculoskeletal: Positive for joint pain.  Skin: Negative.   Neurological: Negative.   Endo/Heme/Allergies: Negative.   Psychiatric/Behavioral: Negative.     Objective:  Physical Exam  Constitutional: She appears well-developed.  HENT:  Head: Normocephalic.  Eyes: Pupils are equal, round, and reactive to light.  Neck: Normal range of motion.  Cardiovascular: Normal rate.   Respiratory: Effort normal.  GI: Soft.  Musculoskeletal:  Left hip exam.  Tender to rotation and any rotation is very painful.  Leg lengths equal.  Walks with an altered gait.  Good neurovascular status.  Neurological: She is alert.  Skin: Skin is warm.  Psychiatric: She has a normal mood and affect.    Vital signs in last 24 hours:    Labs:   Estimated body mass index is 20.37 kg/(m^2) as calculated from the following:   Height as of 07/01/11: 5\' 9"  (1.753 m).   Weight as of 07/01/11: 62.596 kg (138 lb).   Imaging Review Plain radiographs demonstrate severe degenerative joint disease of the left hip(s). The bone quality appears to be good for age and reported activity level.  Assessment/Plan:  End stage arthritis, left hip(s)  The patient history, physical examination, clinical judgement of the provider and imaging studies are consistent with end stage degenerative joint disease of the left hip(s) and total hip arthroplasty is deemed medically necessary. The treatment options including medical management, injection therapy, arthroscopy and arthroplasty were discussed at length. The risks and benefits of total hip arthroplasty were presented and reviewed. The risks due to aseptic loosening, infection, stiffness, dislocation/subluxation,  thromboembolic complications and other imponderables were discussed.  The patient acknowledged the explanation, agreed to proceed with the plan and consent was signed. Patient is being admitted for inpatient treatment for surgery, pain control, PT, OT, prophylactic  antibiotics, VTE prophylaxis, progressive ambulation and ADL's and discharge planning.The patient is planning to be discharged home with home health services

## 2013-09-11 MED ORDER — POVIDONE-IODINE 7.5 % EX SOLN
Freq: Once | CUTANEOUS | Status: DC
  Filled 2013-09-11: qty 118

## 2013-09-11 MED ORDER — LACTATED RINGERS IV SOLN
INTRAVENOUS | Status: DC
  Administered 2013-09-12 (×2): via INTRAVENOUS

## 2013-09-11 MED ORDER — VANCOMYCIN HCL IN DEXTROSE 1-5 GM/200ML-% IV SOLN
1000.0000 mg | INTRAVENOUS | Status: AC
  Administered 2013-09-12: 1000 mg via INTRAVENOUS
  Filled 2013-09-11: qty 200

## 2013-09-12 ENCOUNTER — Encounter (HOSPITAL_COMMUNITY)
Admission: RE | Disposition: A | Discharge status: Home Health | Payer: Self-pay | Source: Ambulatory Visit | Attending: Orthopaedic Surgery

## 2013-09-12 ENCOUNTER — Inpatient Hospital Stay (HOSPITAL_COMMUNITY): Payer: Medicare PPO | Admitting: Anesthesiology

## 2013-09-12 ENCOUNTER — Inpatient Hospital Stay (HOSPITAL_COMMUNITY): Payer: Medicare PPO

## 2013-09-12 ENCOUNTER — Encounter (HOSPITAL_COMMUNITY): Payer: Medicare PPO | Admitting: Vascular Surgery

## 2013-09-12 ENCOUNTER — Encounter (HOSPITAL_COMMUNITY): Payer: Self-pay | Admitting: *Deleted

## 2013-09-12 ENCOUNTER — Inpatient Hospital Stay (HOSPITAL_COMMUNITY)
Admission: RE | Admit: 2013-09-12 | Discharge: 2013-09-14 | DRG: 470 | Disposition: A | Discharge status: Home Health | Payer: Medicare PPO | Source: Ambulatory Visit | Attending: Orthopaedic Surgery | Admitting: Orthopaedic Surgery

## 2013-09-12 DIAGNOSIS — J387 Other diseases of larynx: Secondary | ICD-10-CM | POA: Diagnosis present

## 2013-09-12 DIAGNOSIS — K219 Gastro-esophageal reflux disease without esophagitis: Secondary | ICD-10-CM | POA: Diagnosis present

## 2013-09-12 DIAGNOSIS — Z889 Allergy status to unspecified drugs, medicaments and biological substances status: Secondary | ICD-10-CM

## 2013-09-12 DIAGNOSIS — M1610 Unilateral primary osteoarthritis, unspecified hip: Principal | ICD-10-CM | POA: Diagnosis present

## 2013-09-12 DIAGNOSIS — J4489 Other specified chronic obstructive pulmonary disease: Secondary | ICD-10-CM | POA: Diagnosis present

## 2013-09-12 DIAGNOSIS — G43909 Migraine, unspecified, not intractable, without status migrainosus: Secondary | ICD-10-CM | POA: Diagnosis present

## 2013-09-12 DIAGNOSIS — J449 Chronic obstructive pulmonary disease, unspecified: Secondary | ICD-10-CM | POA: Diagnosis present

## 2013-09-12 DIAGNOSIS — Z880 Allergy status to penicillin: Secondary | ICD-10-CM

## 2013-09-12 DIAGNOSIS — M169 Osteoarthritis of hip, unspecified: Principal | ICD-10-CM | POA: Diagnosis present

## 2013-09-12 DIAGNOSIS — K589 Irritable bowel syndrome without diarrhea: Secondary | ICD-10-CM | POA: Diagnosis present

## 2013-09-12 DIAGNOSIS — Z96649 Presence of unspecified artificial hip joint: Secondary | ICD-10-CM

## 2013-09-12 DIAGNOSIS — M949 Disorder of cartilage, unspecified: Secondary | ICD-10-CM

## 2013-09-12 DIAGNOSIS — G589 Mononeuropathy, unspecified: Secondary | ICD-10-CM | POA: Diagnosis present

## 2013-09-12 DIAGNOSIS — M899 Disorder of bone, unspecified: Secondary | ICD-10-CM | POA: Diagnosis present

## 2013-09-12 HISTORY — PX: TOTAL HIP ARTHROPLASTY: SHX124

## 2013-09-12 LAB — GLUCOSE, CAPILLARY: GLUCOSE-CAPILLARY: 84 mg/dL (ref 70–99)

## 2013-09-12 SURGERY — ARTHROPLASTY, HIP, TOTAL, ANTERIOR APPROACH
Anesthesia: Spinal | Site: Hip | Laterality: Left

## 2013-09-12 MED ORDER — HYDROMORPHONE HCL PF 1 MG/ML IJ SOLN
INTRAMUSCULAR | Status: AC
  Filled 2013-09-12: qty 1

## 2013-09-12 MED ORDER — FENTANYL CITRATE 0.05 MG/ML IJ SOLN
INTRAMUSCULAR | Status: DC | PRN
  Administered 2013-09-12: 50 ug via INTRAVENOUS

## 2013-09-12 MED ORDER — PHENYLEPHRINE 40 MCG/ML (10ML) SYRINGE FOR IV PUSH (FOR BLOOD PRESSURE SUPPORT)
PREFILLED_SYRINGE | INTRAVENOUS | Status: AC
  Filled 2013-09-12: qty 10

## 2013-09-12 MED ORDER — VANCOMYCIN HCL IN DEXTROSE 1-5 GM/200ML-% IV SOLN
1000.0000 mg | Freq: Two times a day (BID) | INTRAVENOUS | Status: AC
  Administered 2013-09-12: 1000 mg via INTRAVENOUS
  Filled 2013-09-12: qty 200

## 2013-09-12 MED ORDER — SUMATRIPTAN SUCCINATE 25 MG PO TABS
25.0000 mg | ORAL_TABLET | ORAL | Status: DC | PRN
  Filled 2013-09-12: qty 1

## 2013-09-12 MED ORDER — PROPOFOL 10 MG/ML IV BOLUS
INTRAVENOUS | Status: AC
  Filled 2013-09-12: qty 20

## 2013-09-12 MED ORDER — LIDOCAINE HCL (CARDIAC) 20 MG/ML IV SOLN
INTRAVENOUS | Status: AC
  Filled 2013-09-12: qty 5

## 2013-09-12 MED ORDER — PROPOFOL INFUSION 10 MG/ML OPTIME
INTRAVENOUS | Status: DC | PRN
  Administered 2013-09-12: 75 ug/kg/min via INTRAVENOUS

## 2013-09-12 MED ORDER — FENTANYL CITRATE 0.05 MG/ML IJ SOLN
INTRAMUSCULAR | Status: AC
  Filled 2013-09-12: qty 5

## 2013-09-12 MED ORDER — HYDROMORPHONE HCL 2 MG PO TABS
1.0000 mg | ORAL_TABLET | ORAL | Status: DC | PRN
  Administered 2013-09-12 – 2013-09-14 (×6): 1 mg via ORAL
  Filled 2013-09-12 (×7): qty 1

## 2013-09-12 MED ORDER — ONDANSETRON HCL 4 MG/2ML IJ SOLN
INTRAMUSCULAR | Status: AC
  Filled 2013-09-12: qty 2

## 2013-09-12 MED ORDER — TRANEXAMIC ACID 100 MG/ML IV SOLN
1000.0000 mg | INTRAVENOUS | Status: DC
  Administered 2013-09-12: 1000 mg via INTRAVENOUS

## 2013-09-12 MED ORDER — PANTOPRAZOLE SODIUM 40 MG PO TBEC
40.0000 mg | DELAYED_RELEASE_TABLET | Freq: Every day | ORAL | Status: DC
  Administered 2013-09-13 – 2013-09-14 (×2): 40 mg via ORAL
  Filled 2013-09-12 (×2): qty 1

## 2013-09-12 MED ORDER — METOCLOPRAMIDE HCL 5 MG/ML IJ SOLN: 5.0000 mg | Freq: Three times a day (TID) | INTRAMUSCULAR | Status: DC | PRN

## 2013-09-12 MED ORDER — FERROUS SULFATE 325 (65 FE) MG PO TABS
325.0000 mg | ORAL_TABLET | Freq: Two times a day (BID) | ORAL | Status: DC
  Administered 2013-09-12 – 2013-09-14 (×4): 325 mg via ORAL
  Filled 2013-09-12 (×6): qty 1

## 2013-09-12 MED ORDER — HYDROMORPHONE HCL PF 1 MG/ML IJ SOLN
0.2500 mg | INTRAMUSCULAR | Status: DC | PRN
  Administered 2013-09-12 (×2): 0.5 mg via INTRAVENOUS

## 2013-09-12 MED ORDER — PROMETHAZINE HCL 25 MG/ML IJ SOLN: 6.2500 mg | INTRAMUSCULAR | Status: DC | PRN

## 2013-09-12 MED ORDER — VITAMIN B-12 1000 MCG PO TABS
1000.0000 ug | ORAL_TABLET | Freq: Every day | ORAL | Status: DC
  Administered 2013-09-13 – 2013-09-14 (×2): 1000 ug via ORAL
  Filled 2013-09-12 (×3): qty 1

## 2013-09-12 MED ORDER — ROCURONIUM BROMIDE 50 MG/5ML IV SOLN
INTRAVENOUS | Status: AC
  Filled 2013-09-12: qty 1

## 2013-09-12 MED ORDER — MIDAZOLAM HCL 2 MG/2ML IJ SOLN
INTRAMUSCULAR | Status: AC
  Filled 2013-09-12: qty 2

## 2013-09-12 MED ORDER — DOCUSATE SODIUM 100 MG PO CAPS
100.0000 mg | ORAL_CAPSULE | Freq: Two times a day (BID) | ORAL | Status: DC
  Administered 2013-09-12 – 2013-09-14 (×4): 100 mg via ORAL
  Filled 2013-09-12 (×4): qty 1

## 2013-09-12 MED ORDER — CALCIUM CARBONATE 1250 (500 CA) MG PO TABS
1.0000 | ORAL_TABLET | Freq: Two times a day (BID) | ORAL | Status: DC
  Administered 2013-09-13 – 2013-09-14 (×3): 500 mg via ORAL
  Filled 2013-09-12 (×6): qty 1

## 2013-09-12 MED ORDER — ONDANSETRON HCL 4 MG PO TABS: 4.0000 mg | ORAL_TABLET | Freq: Four times a day (QID) | ORAL | Status: DC | PRN

## 2013-09-12 MED ORDER — OXYCODONE HCL 5 MG PO TABS: 5.0000 mg | ORAL_TABLET | Freq: Once | ORAL | Status: DC | PRN

## 2013-09-12 MED ORDER — DIPHENHYDRAMINE HCL 12.5 MG/5ML PO ELIX: 12.5000 mg | ORAL_SOLUTION | ORAL | Status: DC | PRN

## 2013-09-12 MED ORDER — PHENOL 1.4 % MT LIQD: 1.0000 | OROMUCOSAL | Status: DC | PRN

## 2013-09-12 MED ORDER — ONDANSETRON HCL 4 MG/2ML IJ SOLN: 4.0000 mg | Freq: Four times a day (QID) | INTRAMUSCULAR | Status: DC | PRN

## 2013-09-12 MED ORDER — FERROUS SULFATE 325 (65 FE) MG PO TABS
325.0000 mg | ORAL_TABLET | Freq: Three times a day (TID) | ORAL | Status: DC
  Filled 2013-09-12 (×3): qty 1

## 2013-09-12 MED ORDER — BUPIVACAINE HCL (PF) 0.75 % IJ SOLN
INTRAMUSCULAR | Status: DC | PRN
  Administered 2013-09-12: 12 mg via INTRATHECAL

## 2013-09-12 MED ORDER — OXYCODONE HCL 5 MG/5ML PO SOLN: 5.0000 mg | Freq: Once | ORAL | Status: DC | PRN

## 2013-09-12 MED ORDER — ALUM & MAG HYDROXIDE-SIMETH 200-200-20 MG/5ML PO SUSP
30.0000 mL | ORAL | Status: DC | PRN
Start: 2013-09-12 — End: 2013-09-14

## 2013-09-12 MED ORDER — ONDANSETRON HCL 4 MG/2ML IJ SOLN
INTRAMUSCULAR | Status: DC | PRN
Start: 2013-09-12 — End: 2013-09-12
  Administered 2013-09-12: 4 mg via INTRAVENOUS

## 2013-09-12 MED ORDER — ASPIRIN EC 325 MG PO TBEC
325.0000 mg | DELAYED_RELEASE_TABLET | Freq: Two times a day (BID) | ORAL | Status: DC
Start: 2013-09-12 — End: 2013-09-14
  Administered 2013-09-12 – 2013-09-14 (×4): 325 mg via ORAL
  Filled 2013-09-12 (×6): qty 1

## 2013-09-12 MED ORDER — VITAMIN D3 25 MCG (1000 UNIT) PO TABS
1000.0000 [IU] | ORAL_TABLET | Freq: Every day | ORAL | Status: DC
  Administered 2013-09-13 – 2013-09-14 (×2): 1000 [IU] via ORAL
  Filled 2013-09-12 (×3): qty 1

## 2013-09-12 MED ORDER — HYDROMORPHONE HCL PF 1 MG/ML IJ SOLN
0.5000 mg | INTRAMUSCULAR | Status: DC | PRN
  Administered 2013-09-12 – 2013-09-13 (×4): 0.5 mg via INTRAVENOUS
  Filled 2013-09-12 (×3): qty 1

## 2013-09-12 MED ORDER — GABAPENTIN 300 MG PO CAPS
300.0000 mg | ORAL_CAPSULE | Freq: Three times a day (TID) | ORAL | Status: DC
  Administered 2013-09-12 – 2013-09-14 (×6): 300 mg via ORAL
  Filled 2013-09-12 (×8): qty 1

## 2013-09-12 MED ORDER — 0.9 % SODIUM CHLORIDE (POUR BTL) OPTIME
TOPICAL | Status: DC | PRN
  Administered 2013-09-12: 1000 mL

## 2013-09-12 MED ORDER — MIDAZOLAM HCL 5 MG/5ML IJ SOLN
INTRAMUSCULAR | Status: DC | PRN
  Administered 2013-09-12: 1 mg via INTRAVENOUS

## 2013-09-12 MED ORDER — METHOCARBAMOL 500 MG PO TABS
500.0000 mg | ORAL_TABLET | Freq: Four times a day (QID) | ORAL | Status: DC | PRN
  Administered 2013-09-12 – 2013-09-14 (×6): 500 mg via ORAL
  Filled 2013-09-12 (×6): qty 1

## 2013-09-12 MED ORDER — ACETAMINOPHEN 650 MG RE SUPP: 650.0000 mg | Freq: Four times a day (QID) | RECTAL | Status: DC | PRN

## 2013-09-12 MED ORDER — TEMAZEPAM 15 MG PO CAPS
15.0000 mg | ORAL_CAPSULE | Freq: Every evening | ORAL | Status: DC | PRN
  Administered 2013-09-13 (×2): 15 mg via ORAL
  Filled 2013-09-12 (×2): qty 1

## 2013-09-12 MED ORDER — ESTRADIOL 1 MG PO TABS
1.0000 mg | ORAL_TABLET | Freq: Every day | ORAL | Status: DC
  Administered 2013-09-13 – 2013-09-14 (×2): 1 mg via ORAL
  Filled 2013-09-12 (×3): qty 1

## 2013-09-12 MED ORDER — MENTHOL 3 MG MT LOZG: 1.0000 | LOZENGE | OROMUCOSAL | Status: DC | PRN

## 2013-09-12 MED ORDER — METOCLOPRAMIDE HCL 5 MG PO TABS
5.0000 mg | ORAL_TABLET | Freq: Three times a day (TID) | ORAL | Status: DC | PRN
  Filled 2013-09-12: qty 2

## 2013-09-12 MED ORDER — DEXTROSE IN LACTATED RINGERS 5 % IV SOLN
75.0000 mL/h | INTRAVENOUS | Status: DC
  Administered 2013-09-12: 75 mL/h via INTRAVENOUS

## 2013-09-12 MED ORDER — EPHEDRINE SULFATE 50 MG/ML IJ SOLN
INTRAMUSCULAR | Status: DC | PRN
  Administered 2013-09-12: 5 mg via INTRAVENOUS
  Administered 2013-09-12: 10 mg via INTRAVENOUS

## 2013-09-12 MED ORDER — PHENYLEPHRINE HCL 10 MG/ML IJ SOLN
INTRAMUSCULAR | Status: DC | PRN
  Administered 2013-09-12 (×8): 80 ug via INTRAVENOUS
  Administered 2013-09-12: 120 ug via INTRAVENOUS

## 2013-09-12 MED ORDER — TRANEXAMIC ACID 100 MG/ML IV SOLN
1000.0000 mg | INTRAVENOUS | Status: DC
  Filled 2013-09-12: qty 10

## 2013-09-12 MED ORDER — ACETAMINOPHEN 325 MG PO TABS: 650.0000 mg | ORAL_TABLET | Freq: Four times a day (QID) | ORAL | Status: DC | PRN

## 2013-09-12 SURGICAL SUPPLY — 46 items
BLADE SAW SGTL 18X1.27X75 (BLADE) ×2 IMPLANT
BLADE SAW SGTL 18X1.27X75MM (BLADE) ×1
BLADE SURG ROTATE 9660 (MISCELLANEOUS) IMPLANT
CAPT HIP PF MOP ×3 IMPLANT
CELLS DAT CNTRL 66122 CELL SVR (MISCELLANEOUS) ×1 IMPLANT
COVER SURGICAL LIGHT HANDLE (MISCELLANEOUS) ×3 IMPLANT
DRAPE C-ARM 42X72 X-RAY (DRAPES) ×3 IMPLANT
DRAPE STERI IOBAN 125X83 (DRAPES) ×3 IMPLANT
DRAPE U-SHAPE 47X51 STRL (DRAPES) ×9 IMPLANT
DRSG AQUACEL AG ADV 3.5X10 (GAUZE/BANDAGES/DRESSINGS) ×3 IMPLANT
DURAPREP 26ML APPLICATOR (WOUND CARE) ×3 IMPLANT
ELECT BLADE 4.0 EZ CLEAN MEGAD (MISCELLANEOUS)
ELECT CAUTERY BLADE 6.4 (BLADE) ×3 IMPLANT
ELECT REM PT RETURN 9FT ADLT (ELECTROSURGICAL) ×3
ELECTRODE BLDE 4.0 EZ CLN MEGD (MISCELLANEOUS) IMPLANT
ELECTRODE REM PT RTRN 9FT ADLT (ELECTROSURGICAL) ×1 IMPLANT
FACESHIELD LNG OPTICON STERILE (SAFETY) ×6 IMPLANT
GLOVE BIO SURGEON STRL SZ8 (GLOVE) ×3 IMPLANT
GLOVE BIO SURGEON STRL SZ8.5 (GLOVE) ×3 IMPLANT
GLOVE BIOGEL PI IND STRL 8 (GLOVE) ×1 IMPLANT
GLOVE BIOGEL PI IND STRL 8.5 (GLOVE) ×1 IMPLANT
GLOVE BIOGEL PI INDICATOR 8 (GLOVE) ×2
GLOVE BIOGEL PI INDICATOR 8.5 (GLOVE) ×2
GOWN PREVENTION PLUS LG XLONG (DISPOSABLE) IMPLANT
GOWN STRL NON-REIN LRG LVL3 (GOWN DISPOSABLE) ×6 IMPLANT
GOWN STRL REIN XL XLG (GOWN DISPOSABLE) ×3 IMPLANT
KIT BASIN OR (CUSTOM PROCEDURE TRAY) ×3 IMPLANT
KIT ROOM TURNOVER OR (KITS) ×3 IMPLANT
MANIFOLD NEPTUNE II (INSTRUMENTS) ×3 IMPLANT
NS IRRIG 1000ML POUR BTL (IV SOLUTION) ×3 IMPLANT
PACK TOTAL JOINT (CUSTOM PROCEDURE TRAY) ×3 IMPLANT
PAD ARMBOARD 7.5X6 YLW CONV (MISCELLANEOUS) ×6 IMPLANT
RTRCTR WOUND ALEXIS 18CM MED (MISCELLANEOUS) ×3
STAPLER VISISTAT 35W (STAPLE) ×3 IMPLANT
SUT ETHIBOND NAB CT1 #1 30IN (SUTURE) ×6 IMPLANT
SUT VIC AB 0 CT1 27 (SUTURE)
SUT VIC AB 0 CT1 27XBRD ANBCTR (SUTURE) IMPLANT
SUT VIC AB 1 CT1 27 (SUTURE) ×2
SUT VIC AB 1 CT1 27XBRD ANBCTR (SUTURE) ×1 IMPLANT
SUT VIC AB 2-0 CT1 27 (SUTURE) ×2
SUT VIC AB 2-0 CT1 TAPERPNT 27 (SUTURE) ×1 IMPLANT
SUT VLOC 180 0 24IN GS25 (SUTURE) ×3 IMPLANT
TOWEL OR 17X24 6PK STRL BLUE (TOWEL DISPOSABLE) ×3 IMPLANT
TOWEL OR 17X26 10 PK STRL BLUE (TOWEL DISPOSABLE) ×6 IMPLANT
TRAY FOLEY CATH 14FR (SET/KITS/TRAYS/PACK) IMPLANT
WATER STERILE IRR 1000ML POUR (IV SOLUTION) ×6 IMPLANT

## 2013-09-12 NOTE — Plan of Care (Signed)
Problem: Consults Goal: Diagnosis- Total Joint Replacement Primary Total Hip Left     

## 2013-09-12 NOTE — Evaluation (Signed)
Physical Therapy Evaluation Patient Details Name: Wendy Patton MRN: [id number] DOB: 09-25-1935 Today's Date: 09/12/2013 Time: 9924-2683 PT Time Calculation (min): 37 min  PT Assessment / Plan / Recommendation History of Present Illness  s/p LTHA; of note, pt with bil wrist pain premorbidly  Clinical Impression  Pt is s/p THA resulting in the deficits listed below (see PT Problem List).  Pt will benefit from skilled PT to increase their independence and safety with mobility to allow discharge to the venue listed below.     Pt reported dizziness after transfers, steps, and time on [facility] -- she described it as feeling like her blood sugar was low; RN notified, and she advised [place] to give pt juice, which we did (2 cups apple juice), and pt reported she felt a bit better, and wanted to resume her visit with her preacher (and husband)     PT Assessment  Patient needs continued PT services    Follow Up Recommendations  Home health PT;Supervision/Assistance - 24 hour    Does the patient have the potential to tolerate intense rehabilitation      Barriers to Discharge        Equipment Recommendations  Rolling walker with 5" wheels;3in1 (PT)  If pt's bilateral wrist pain becomes intolerable with RW, will consider Bil platform RW or possibly [place] crutches   Recommendations for Other Services OT consult   Frequency 7X/week    Precautions / Restrictions Precautions Precautions: Fall Restrictions LLE Weight Bearing: Weight bearing as tolerated   Pertinent Vitals/Pain Noted grimace with movement, and pt reported being uncomfortable on BSC; When asked to rate her pain, she did not give a rating; patient repositioned for comfort  Pt reported dizziness after transfers, steps, and time on BSC; Vital signs were stable      Mobility  Bed Mobility Overal bed mobility: Needs Assistance Bed Mobility: Supine to Sit Supine to sit: Min assist General bed mobility comments: Cues for  technique and to self-monitor for activity tolerance Transfers Overall transfer level: Needs assistance Equipment used: Rolling walker (2 wheeled) Transfers: Sit to/from Stand Sit to Stand: Min assist General transfer comment: Cues for technique, safety and hand placement; Pt began to leak urine at initial stand, so took pivot steps to [facility] Ambulation/Gait Ambulation/Gait assistance: Min guard;+2 safety/equipment Ambulation Distance (Feet): 5 Feet (pivot steps bed to BSC, then BSC to recliner) Assistive device: Rolling walker (2 wheeled) Gait Pattern/deviations: Step-to pattern General Gait Details: cues for gait sequence, and to self-monitor for activity tolerance    Exercises     PT Diagnosis: Difficulty walking;Acute pain  PT Problem List: Decreased strength;Decreased range of motion;Decreased activity tolerance;Decreased balance;Decreased mobility;Decreased knowledge of use of DME;Decreased knowledge of precautions;Pain PT Treatment Interventions: DME instruction;Gait training;Stair training;Functional mobility training;Therapeutic activities;Therapeutic exercise;Patient/family education;Balance training     PT Goals(Current goals can be found in the care plan section) Acute Rehab PT Goals Patient Stated Goal: back to walk dogs and going on therapy dog visits; she also mentioned dancing PT Goal Formulation: With patient Time For Goal Achievement: 09/19/13 Potential to Achieve Goals: Good  Visit Information  Last PT Received On: 09/12/13 Assistance Needed: +1 History of Present Illness: s/p LTHA; of note, pt with bil wrist pain premorbidly       Prior [place] expects to be discharged to:: Private residence Living Arrangements: Spouse/significant other Available Help at Discharge: Family;Available 24 hours/day Type of Home: House Home Access: Stairs to enter [employer] of Steps: 3 (2+1  side door; front: 4 down from where she  parks, then 6) Entrance Stairs-Rails: None Home Layout: One level Home Equipment: None Prior Function Level of Independence: Independent Communication Communication: No difficulties    Cognition  Cognition Arousal/Alertness: Awake/alert Behavior During Therapy: WFL for tasks assessed/performed Overall Cognitive Status: Within Functional Limits for tasks assessed    Extremity/Trunk Assessment Upper Extremity Assessment Upper Extremity Assessment: Defer to OT evaluation (Reports bil wrist pain) Lower Extremity Assessment Lower Extremity Assessment: LLE deficits/detail LLE Deficits / Details: Grossly decr AROM and strength, limited by pain postop LLE: Unable to fully assess due to pain   Balance Balance Overall balance assessment: Needs assistance Sitting-balance support: Bilateral upper extremity supported;Feet supported Sitting balance-Leahy Scale: Fair Sitting balance - Comments: Dizzy initially in upright position, which subsided with time Standing balance support: Bilateral upper extremity supported Standing balance-Leahy Scale: Good  End of Session PT - End of Session Activity Tolerance: Patient tolerated treatment well;Patient limited by fatigue (quite lightheaded once in recliner) Patient left: in chair;with call bell/phone within reach;with family/visitor present (reclined) Nurse Communication: Mobility status;Other (comment) (Request to check pt's blood sugar, and assess pt's IV)  GP     Quin Hoop Wadley, [place]  09/12/2013, 5:03 PM

## 2013-09-12 NOTE — Anesthesia Procedure Notes (Signed)
Spinal  Patient location during procedure: OR Start time: 09/12/2013 7:30 AM End time: 09/12/2013 7:41 AM Staffing Anesthesiologist: Vanesa Renier Performed by: anesthesiologist  Preanesthetic Checklist Completed: patient identified, site marked, surgical consent, pre-op evaluation, timeout performed, IV checked, risks and benefits discussed and monitors and equipment checked Spinal Block Patient position: sitting Prep: Betadine and site prepped and draped Patient monitoring: heart rate, cardiac monitor, continuous pulse ox and blood pressure Approach: midline Location: L3-4 Injection technique: single-shot Needle Needle type: Tuohy  Needle gauge: 22 G Needle length: 5 cm Needle insertion depth: 2 cm Assessment Sensory level: T6 Additional Notes Tolerated well

## 2013-09-12 NOTE — Op Note (Signed)
PRE-OP DIAGNOSIS:  LEFT HIP DEGENERATIVE JOINT DISEASE POST-OP DIAGNOSIS: same PROCEDURE:  LEFT TOTAL HIP ARTHROPLASTY ANTERIOR APPROACH ANESTHESIA:  Spinal and MAC SURGEON:  Melrose Nakayama MD ASSISTANT:  Cleotis Nipper OPA-C and April Green RNFA   INDICATIONS FOR PROCEDURE:  The patient is a 78 y.o. female with a long history of a painful hip.  This has persisted despite multiple conservative measures.  The patient has persisted with pain and dysfunction making rest and activity difficult.  A total hip replacement is offered as surgical treatment.  Informed operative consent was obtained after discussion of possible complications including reaction to anesthesia, infection, neurovascular injury, dislocation, DVT, PE, and death.  The importance of the postoperative rehab program to optimize result was stressed with the patient.  SUMMARY OF FINDINGS AND PROCEDURE:  Under general anesthesia through a anterior approach an the Hana table a left THR was performed.  The patient had severe degenerative change and good bone quality.  We used DePuy components to replace the hip and these were size KLA 11 Corail femur capped with a +1 61mm hip ball.  On the acetabular side we used a size 50 Gription shell with a plus 4 neutral polyethylene liner.  We did use a hole eliminator.  Cleotis Nipper and April Green assisted throughout and were invaluable to the completion of the case in that he helped position and retract while I performed the procedure.  He also closed simultaneously to help minimize OR time.  I used fluoroscopy throughout the case to check position of components and leg lengths and read all these views myself.  DESCRIPTION OF PROCEDURE:  The patient was taken to the OR suite where general anesthetic was applied.  The patient was then positioned on the Hana table supine.  All bony prominences were appropriately padded.  Prep and drape was then performed in normal sterile fashion.  The patient was  given vancomycin preoperative antibiotic and an appropriate time out was performed.  We then took an anterior approach to the right hip.  Dissection was taken through adipose to the tensor fascia lata fascia.  This structure was incised longitudinally and we dissected in the intermuscular interval just medial to this muscle.  Cobra retractors were placed superior and inferior to the femoral neck superficial to the capsule.  A capsular incision was then made and the retractors were placed along the femoral neck.  Xray was brought in to get a good level for the femoral neck cut which was made with an oscillating saw and osteotome.  The femoral head was removed with a corkscrew.  The acetabulum was exposed and some labral tissues were excised. Reaming was taken to the inside wall of the pelvis and sequentially up to 1 mm smaller than the actual component.  A trial of components was done and then the aforementioned acetabular shell was placed in appropriate tilt and anteversion confirmed by fluoroscopy. The liner was placed along with the hole eliminator and attention was turned to the femur.  The leg was brought down and over into adduction and the elevator bar was used to raise the femur up gently in the wound.  The piriformis was released with care taken to preserve the obturator internus attachment and all of the posterior capsule. The femur was reamed and then broached to the appropriate size.  A trial reduction was done and the aforementioned head and neck assembly gave [place] the best stability in extension with external rotation.  Leg lengths were felt to be  about equal by fluoroscopic exam.  The trial components were removed and the wound irrigated.  We then placed the femoral component in appropriate anteversion.  The head was applied to a dry stem neck and the hip again reduced.  It was again stable in the aforementioned position.  The would was irrigated again followed by re-approximation of anterior capsule  with ethibond suture. Tensor fascia was repaired with V-loc suture  followed by subcutaneous closure with #O and #2 undyed vicryl.  Skin was closed with staples followed by a sterile dressing.  EBL and IOF can be obtained from anesthesia records.  DISPOSITION:  The patient was extubated in the OR and taken to PACU in stable condition to be admitted to the Orthopedic Surgery for appropriate post-op care to include perioperative antibiotics and DVT prophylaxis.

## 2013-09-12 NOTE — Transfer of Care (Signed)
Immediate Anesthesia Transfer of Care Note  Patient: Wendy Patton  Procedure(s) Performed: Procedure(s): LEFT TOTAL HIP ARTHROPLASTY ANTERIOR APPROACH (Left)  Patient Location: PACU  Anesthesia Type:Spinal  Level of Consciousness: awake, alert  and oriented  Airway & Oxygen Therapy: Patient Spontanous Breathing and Patient connected to face mask oxygen  Post-op Assessment: Report given to PACU RN, Post -op Vital signs reviewed and stable and Patient moving all extremities X 4  Post vital signs: Reviewed and stable  Complications: No apparent anesthesia complications

## 2013-09-12 NOTE — Anesthesia Postprocedure Evaluation (Signed)
  Anesthesia Post-op Note  Patient: Wendy Patton  Procedure(s) Performed: Procedure(s): LEFT TOTAL HIP ARTHROPLASTY ANTERIOR APPROACH (Left)  Patient Location: PACU  Anesthesia Type:Spinal  Level of Consciousness: awake and alert   Airway and Oxygen Therapy: Patient Spontanous Breathing  Post-op Pain: none  Post-op Assessment: Post-op Vital signs reviewed  Post-op Vital Signs: stable  Complications: No apparent anesthesia complications

## 2013-09-12 NOTE — Interval H&P Note (Signed)
History and Physical Interval Note:  09/12/2013 7:24 AM  Wendy Patton  has presented today for surgery, with the diagnosis of LEFT HIP DEGENERATIVE JOINT DISEASE  The various methods of treatment have been discussed with the patient and family. After consideration of risks, benefits and other options for treatment, the patient has consented to  Procedure(s): LEFT TOTAL HIP ARTHROPLASTY ANTERIOR APPROACH (Left) as a surgical intervention .  The patient's history has been reviewed, patient examined, no change in status, stable for surgery.  I have reviewed the patient's chart and labs.  Questions were answered to the patient's satisfaction.     Oniel Meleski G

## 2013-09-13 LAB — CBC
HCT: 36.5 % (ref 36.0–46.0)
Hemoglobin: 12.4 g/dL (ref 12.0–15.0)
MCH: 28.6 pg (ref 26.0–34.0)
MCHC: 34 g/dL (ref 30.0–36.0)
MCV: 84.1 fL (ref 78.0–100.0)
Platelets: 229 10*3/uL (ref 150–400)
RBC: 4.34 MIL/uL (ref 3.87–5.11)
RDW: 14 % (ref 11.5–15.5)
WBC: 7.5 10*3/uL (ref 4.0–10.5)

## 2013-09-13 LAB — BASIC METABOLIC PANEL
BUN: 15 mg/dL (ref 6–23)
CHLORIDE: 97 meq/L (ref 96–112)
CO2: 24 meq/L (ref 19–32)
Calcium: 8.9 mg/dL (ref 8.4–10.5)
Creatinine, Ser: 0.74 mg/dL (ref 0.50–1.10)
GFR calc Af Amer: >90 mL/min (ref >90)
GFR, EST NON AFRICAN AMERICAN: 81 mL/min — AB (ref >90)
Glucose, Bld: 128 mg/dL — ABNORMAL HIGH (ref 70–99)
POTASSIUM: 4.1 meq/L (ref 3.7–5.3)
SODIUM: 134 meq/L — AB (ref 137–147)

## 2013-09-13 NOTE — Progress Notes (Signed)
Subjective: 1 Day Post-Op Procedure(s) (LRB): LEFT TOTAL HIP ARTHROPLASTY ANTERIOR APPROACH (Left) No restrictions, may be weightbearing as tolerated left side. Plan is to go home tomorrow if cleared by therapy Activity level:  Weightbearing as tolerated Diet tolerance:  ok Voiding:  ok Patient reports pain as 2 on 0-10 scale.    Objective: Vital signs in last 24 hours: Temp:  [96 F (35.6 C)-98.5 F (36.9 C)] 98.5 F (36.9 C) (02/11 0628) Pulse Rate:  [52-76] 76 (02/11 0628) Resp:  [10-20] 16 (02/11 0628) BP: (91-158)/(51-76) 149/60 mmHg (02/11 0628) SpO2:  [97 %-100 %] 99 % (02/11 0628) Weight:  [64.864 kg (143 lb)] 64.864 kg (143 lb) (02/11 0300)  Labs:  Recent Labs  09/13/13 0430  HGB 12.4    Recent Labs  09/13/13 0430  WBC 7.5  RBC 4.34  HCT 36.5  PLT 229    Recent Labs  09/13/13 0430  NA 134*  K 4.1  CL 97  CO2 24  BUN 15  CREATININE 0.74  GLUCOSE 128*  CALCIUM 8.9   No results found for this basename: LABPT, INR,  in the last 72 hours  Physical Exam:  Neurologically intact ABD soft Neurovascular intact Sensation intact distally Intact pulses distally Dorsiflexion/Plantar flexion intact No cellulitis present Compartment soft  Assessment/Plan:  1 Day Post-Op Procedure(s) (LRB): LEFT TOTAL HIP ARTHROPLASTY ANTERIOR APPROACH (Left) Advance diet Up with therapy Plan for discharge tomorrow if cleared by therapy ASA 3251 pill twice a day x4 weeks. Continue incentive spirometry and SCDs.    Wendy Patton R 09/13/2013, 8:20 AM

## 2013-09-13 NOTE — Progress Notes (Signed)
Physical Therapy Treatment Patient Details Name: Wendy Patton MRN: [id number] DOB: 10/06/1935 Today's Date: 09/13/2013 Time: 1500-1520 PT Time Calculation (min): 20 min  PT Assessment / Plan / Recommendation  History of Present Illness s/p LTHA; of note, pt with bil wrist pain premorbidly   PT Comments   Good participation in therex -- reported each movement hurt less the more reps she performed; Patient needs to practice stairs next session.   On track for dc home tomorrow   Follow Up Recommendations  Home health PT;Supervision/Assistance - 24 hour     Does the patient have the potential to tolerate intense rehabilitation     Barriers to Discharge        Equipment Recommendations  Rolling walker with 5" wheels;3in1 (PT)    Recommendations for Other Services    Frequency 7X/week   Progress towards PT Goals Progress towards PT goals: Progressing toward goals  Plan Current plan remains appropriate    Precautions / Restrictions Precautions Precautions: Fall Restrictions LLE Weight Bearing: Weight bearing as tolerated   Pertinent Vitals/Pain 6/10 L hip with heel slides, but subsided quickly, and improved with reps patient repositioned for comfort     Mobility       Exercises Total Joint Exercises Ankle Circles/Pumps: AROM;Both;20 reps Gluteal Sets: AROM;Both;15 reps Towel Squeeze: AROM;Both;15 reps Heel Slides: AAROM;Left;15 reps Hip ABduction/ADduction: AAROM;Left;15 reps Bridges: AROM;Both;15 reps   PT Diagnosis:    PT Problem List:   PT Treatment Interventions:     PT Goals (current goals can now be found in the care plan section) Acute Rehab PT Goals Patient Stated Goal: back to walk dogs and going on therapy dog visits; she also mentioned dancing PT Goal Formulation: With patient Time For Goal Achievement: 09/19/13 Potential to Achieve Goals: Good  Visit Information  Last PT Received On: 09/13/13 Assistance Needed: +1 History of Present Illness:  s/p LTHA; of note, pt with bil wrist pain premorbidly    Subjective Data  Subjective: Exhausted late in the day; requesting to do therex and save stair training for next session Patient Stated Goal: back to walk dogs and going on therapy dog visits; she also mentioned dancing   Cognition  Cognition Arousal/Alertness: Awake/alert Behavior During Therapy: WFL for tasks assessed/performed Overall Cognitive Status: Within Functional Limits for tasks assessed    Balance     End of Session PT - End of Session Activity Tolerance: Patient tolerated treatment well Patient left: in bed;with call bell/phone within reach   [place], Amelia 09/13/2013, 9:57 PM

## 2013-09-13 NOTE — Progress Notes (Signed)
OT Cancellation Note  Patient Details Name: DYANI BABEL MRN: [id number] DOB: [birth year]   Cancelled Treatment:    Reason Eval/Treat Not Completed: Fatigue/lethargy limiting ability to participate.  Pt back in bed and too tired to practice tub bench transfer.  Will reattempt tomorrow if possible  Elantra Caprara 09/13/2013, 2:52 PM Lesle Chris, OTR/L [phone] 09/13/2013

## 2013-09-13 NOTE — Progress Notes (Signed)
Physical Therapy Treatment Patient Details Name: Wendy Patton MRN: [id number] DOB: 13-May-1936 Today's Date: 09/13/2013 Time: 1101-1130 PT Time Calculation (min): 29 min  PT Assessment / Plan / Recommendation  History of Present Illness s/p LTHA; of note, pt with bil wrist pain premorbidly   PT Comments   Good progress with mobiltiy  And activity tolerance; On track for dc home tomorrow  Follow Up Recommendations  Home health PT;Supervision/Assistance - 24 hour     Does the patient have the potential to tolerate intense rehabilitation     Barriers to Discharge        Equipment Recommendations  Rolling walker with 5" wheels;3in1 (PT)    Recommendations for Other Services    Frequency 7X/week   Progress towards PT Goals Progress towards PT goals: Progressing toward goals  Plan Current plan remains appropriate    Precautions / Restrictions Precautions Precautions: Fall Restrictions LLE Weight Bearing: Weight bearing as tolerated   Pertinent Vitals/Pain 6/10 pain with transitions; reports is relatively comfortable without moving patient repositioned for comfort     Mobility  Bed Mobility Overal bed mobility: Needs Assistance Bed Mobility: Supine to Sit Supine to sit: Min assist General bed mobility comments: Cues for technique and to self-monitor for activity tolerance; able to manage without physical assist Transfers Overall transfer level: Needs assistance Equipment used: Rolling walker (2 wheeled) Transfers: Sit to/from Stand Sit to Stand: Min guard General transfer comment: cues to extend LLE for comfort Ambulation/Gait Ambulation/Gait assistance: Min guard Ambulation Distance (Feet): 110 Feet Assistive device: Rolling walker (2 wheeled) Gait Pattern/deviations: Step-to pattern Gait velocity: decr General Gait Details: cues for gait sequence, and to self-monitor for activity tolerance    Exercises     PT Diagnosis:    PT Problem List:   PT Treatment  Interventions:     PT Goals (current goals can now be found in the care plan section) Acute Rehab PT Goals Patient Stated Goal: back to walk dogs and going on therapy dog visits; she also mentioned dancing PT Goal Formulation: With patient Time For Goal Achievement: 09/19/13 Potential to Achieve Goals: Good  Visit Information  Last PT Received On: 09/13/13 Assistance Needed: +1 History of Present Illness: s/p LTHA; of note, pt with bil wrist pain premorbidly    Subjective Data  Subjective: Has been up already Patient Stated Goal: back to walk dogs and going on therapy dog visits; she also mentioned dancing   Cognition  Cognition Arousal/Alertness: Awake/alert Behavior During Therapy: WFL for tasks assessed/performed Overall Cognitive Status: Within Functional Limits for tasks assessed    Balance     End of Session PT - End of Session Activity Tolerance: Patient tolerated treatment well Patient left: in chair;with call bell/phone within reach Nurse Communication: Mobility status   GP     Roney Marion [facility] [place], [place]  09/13/2013, 4:22 PM

## 2013-09-13 NOTE — Care Management Note (Signed)
CARE MANAGEMENT NOTE 09/13/2013  Patient:  Wendy Patton, Wendy Patton   Account Number:  [id number]  Date Initiated:  09/13/2013  Documentation initiated by:  Ricki Miller  Subjective/Objective Assessment:   78 yr old female s/p left total hip arthroplasty.     Action/Plan:   Case manager spoke with patient and family concerning home health and DME needs at discharge. Choice offered. Referral made to [facility].liasion.and 3in1 have been delivered.Patient requests a tub bench.Has family support at D/C.   Anticipated DC Date:  09/14/2013   Anticipated DC Plan:  [place] [facility]  CM consult      [place]   Choice offered to / List presented to:  C-1 Patient   DME arranged  WALKER - ROLLING  3-N-1  TUB BENCH      DME agency  [facility] arranged  [place].   Status of service:  Completed, signed off

## 2013-09-13 NOTE — Evaluation (Signed)
Occupational Therapy Evaluation Patient Details Name: Wendy Patton MRN: [id number] DOB: 11-27-1935 Today's Date: 09/13/2013 Time: 3016-0109 OT Time Calculation (min): 35 min  OT Assessment / Plan / Recommendation History of present illness s/p LTHA; of note, pt with bil wrist pain premorbidly   Clinical Impression   Pt was admitted for the above surgery.  She will benefit from skilled OT in acute to continue to educate and promote independence with bathroom transfers in addition to recommending tub DME.  Husband will assist with adls at home, and he is a CNA.      OT Assessment  Patient needs continued OT Services    Follow Up Recommendations  No OT follow up    Barriers to Discharge      Equipment Recommendations  3 in 1 bedside comode    Recommendations for Other Services    Frequency  Min 2X/week    Precautions / Restrictions Precautions Precautions: Fall Restrictions Weight Bearing Restrictions: Yes LLE Weight Bearing: Weight bearing as tolerated   Pertinent Vitals/Pain 8/10 LLE.  RN brought pain meds and repositioned in bed with ice    ADL  Grooming: Wash/dry hands;Wash/dry face;Supervision/safety Where Assessed - Grooming: Supported standing Upper Body Bathing: Supervision/safety Where Assessed - Upper Body Bathing: Supported standing Lower Body Bathing: Moderate assistance Where Assessed - Lower Body Bathing: Supported sit to stand Upper Body Dressing: Minimal assistance Where Assessed - Upper Body Dressing: Supported standing Lower Body Dressing: Maximal assistance Where Assessed - Lower Body Dressing: Supported sit to Lobbyist: Magazine features editor Method: Sit to Loss adjuster, chartered: Therapist, occupational and Hygiene: Supervision/safety Where Assessed - Best boy and Hygiene: Sit to stand from 3-in-1 or toilet Equipment Used: Rolling walker Transfers/Ambulation Related to  ADLs: ambulated to bathroom with min guard ADL Comments: Pt performed adl at sink, mostly standing.  She has a Secondary school teacher at home but husband will also help.  Showed photo of tub bench.  She would like to try this on next visit.  She would also benefit from 3:1 at home to use armrests to push up from    OT Diagnosis: Generalized weakness  OT Problem List: Decreased strength;Decreased activity tolerance;Decreased knowledge of use of DME or AE;Pain OT Treatment Interventions: Self-care/ADL training;DME and/or AE instruction;Patient/family education   OT Goals(Current goals can be found in the care plan section) Acute Rehab OT Goals Patient Stated Goal: back to walk dogs and going on therapy dog visits; she also mentioned dancing OT Goal Formulation: With patient Time For Goal Achievement: 09/20/13 Potential to Achieve Goals: Good ADL Goals Pt Will Transfer to Toilet: with supervision;bedside commode;ambulating Pt Will Perform Tub/Shower Transfer: with min assist;ambulating;tub bench  Visit Information  Last OT Received On: 09/13/13 Assistance Needed: +1 History of Present Illness: s/p LTHA; of note, pt with bil wrist pain premorbidly       Prior [place] expects to be discharged to:: Private residence Living Arrangements: Spouse/significant other Available Help at Discharge: Family;Available 24 hours/day Home Equipment: None Additional Comments: husband is a cna.  They have a reacher at home Prior Function Level of Independence: Independent Communication Communication: No difficulties         Vision/Perception     Cognition  Cognition Arousal/Alertness: Awake/alert Behavior During Therapy: WFL for tasks assessed/performed Overall Cognitive Status: Within Functional Limits for tasks assessed    Extremity/Trunk Assessment Upper Extremity Assessment Upper Extremity Assessment: Generalized weakness (c/o pulling muscle  in R shoulder and bil  wrists hurt)     Mobility Bed Mobility Sit to supine: Min assist General bed mobility comments: assist for LLE Transfers Transfers: Sit to/from Stand Sit to Stand: Min guard General transfer comment: cues to extend LLE for comfort     Exercise     Balance     End of Session OT - End of Session Activity Tolerance: Patient tolerated treatment well Patient left: in bed;with call bell/phone within reach;with family/visitor present;with nursing/sitter in room Nurse Communication: Patient requests pain meds  GO     Jimma Ortman 09/13/2013, 11:09 AM Lesle Chris, OTR/L [phone] 09/13/2013

## 2013-09-14 ENCOUNTER — Encounter (HOSPITAL_COMMUNITY): Payer: Self-pay | Admitting: Orthopaedic Surgery

## 2013-09-14 LAB — CBC
HCT: 34.1 % — ABNORMAL LOW (ref 36.0–46.0)
Hemoglobin: 11.5 g/dL — ABNORMAL LOW (ref 12.0–15.0)
MCH: 28.1 pg (ref 26.0–34.0)
MCHC: 33.7 g/dL (ref 30.0–36.0)
MCV: 83.4 fL (ref 78.0–100.0)
Platelets: 212 10*3/uL (ref 150–400)
RBC: 4.09 MIL/uL (ref 3.87–5.11)
RDW: 14.1 % (ref 11.5–15.5)
WBC: 7.2 10*3/uL (ref 4.0–10.5)

## 2013-09-14 MED ORDER — METHOCARBAMOL 500 MG PO TABS: 500.0000 mg | ORAL_TABLET | Freq: Four times a day (QID) | ORAL | Status: DC | PRN

## 2013-09-14 MED ORDER — TRAMADOL HCL 50 MG PO TABS: 50.0000 mg | ORAL_TABLET | Freq: Four times a day (QID) | ORAL | Status: DC | PRN

## 2013-09-14 MED ORDER — ASPIRIN 325 MG PO TBEC: 325.0000 mg | DELAYED_RELEASE_TABLET | Freq: Two times a day (BID) | ORAL | Status: DC

## 2013-09-14 NOTE — Progress Notes (Signed)
Occupational Therapy Treatment Patient Details Name: Wendy Patton MRN: [id number] DOB: 1936/06/06 Today's Date: 09/14/2013 Time: 1008-1050 OT Time Calculation (min): 42 min  OT Assessment / Plan / Recommendation  History of present illness s/p LTHA; of note, pt with bil wrist pain premorbidly   OT comments  Patient in bed with husband at her side upon arrival.  Engaged in tub bench transfer to include bed mobility, bed><recliner, ambulate to include sidesteps to simulate narrow bathroom.  Husband present and independent assisting PRN secondary to patient with occasional LLE pain with some mobility/activity.  Goals met/education completed, patient discharged from OT   Follow Up Recommendations  No OT follow up    Equipment Recommendations   (3 in 1 BSC delivered to room today, already has tub bench)    Progress towards OT Goals Goals met/education completed, patient discharged from [place] All goals met and education completed, patient discharged from OT services;Discharge plan remains appropriate    Precautions / Restrictions Precautions Precautions: Fall Restrictions Weight Bearing Restrictions: Yes LLE Weight Bearing: Weight bearing as tolerated   Pertinent Vitals/Pain 0/10 when not moving, up to 10/10 LLE pain with some mobility/activity.  Rest, repositioned and RN notified to provide pain medication    ADL  Tub/Shower Transfer: Performed;Minimal assistance Tub/Shower Transfer Method: Ambulating Tub/Shower Transfer Equipment: IT consultant Used: Rolling walker Transfers/Ambulation Related to ADLs: Assist with LLE management out of and back into bed due to severe pain.  Patient able to put LLE into and out of tub while seated on tub bench however with severe pain.  Performed sides steps with RW to access tub bench to simulate narrow bathroom ADL Comments: Patient reports that she has not taken pain medicine since last night only muscle relaxers.  Encouraged  patient to consider taking some type of pain medicine on a scheduled basis to keep pain at [place].  Patient reports, "it only hursts when I move".    OT Goals(current goals can now be found in the care plan section) Acute Rehab OT Goals Patient Stated Goal: back to walk dogs and going on therapy dog visits; she also mentioned dancing  Visit Information  Last OT Received On: 09/14/13 Assistance Needed: +1 History of Present Illness: s/p LTHA; of note, pt with bil wrist pain premorbidly    Prior [place] expects to be discharged to:: Private residence Living Arrangements: Spouse/significant other    Cognition  Cognition Arousal/Alertness: Awake/alert Behavior During Therapy: WFL for tasks assessed/performed Overall Cognitive Status: Within Functional Limits for tasks assessed    Mobility  Bed Mobility Overal bed mobility: Needs Assistance Bed Mobility: Supine to Sit Supine to sit: Supervision Sit to supine: Min assist General bed mobility comments: pt with incr difficulty advancing Lt LE to/off EOB; cues and educated on technique to hook long sheet around foot and use UEs.  Patient is limitied by pain. Transfers Overall transfer level: Needs assistance Equipment used: Rolling walker (2 wheeled) Transfers: Sit to/from Stand Sit to Stand: Min guard;From elevated surface (and non elevated surface.  Heavily relys on armrests) General transfer comment: pt with incr difficulty with sit to stand from lower surfaced bed; progressed to supervision with transfer from [facility] due to elevated surface and armrests; cues for hand placement and sequencing     End of Session OT - End of Session Equipment Utilized During Treatment: Rolling walker Activity Tolerance: Patient limited by pain Patient left: in bed;with family/visitor present;with call bell/phone within reach Nurse Communication:  Patient requests pain meds  GO     Wendy Patton, [place] 09/14/2013, 11:09  AM

## 2013-09-14 NOTE — Progress Notes (Signed)
Physical Therapy Treatment Patient Details Name: JAYLYNE BREESE MRN: [id number] DOB: [birth year] Today's Date: 09/14/2013 Time: 1062-6948 PT Time Calculation (min): 13 min  PT Assessment / Plan / Recommendation  History of Present Illness s/p LTHA; of note, pt with bil wrist pain premorbidly   PT Comments   Pt seen for additional session today to educate pt and husband on proper stair ambulation technique. Pt and husband given handout and educated thoroughly on proper technique. Pt and husband able to demo good teach back technique. Pt hopeful for D/C today. Will cont to follow until D/C.   Follow Up Recommendations  Home health PT;Supervision/Assistance - 24 hour     Does the patient have the potential to tolerate intense rehabilitation     Barriers to Discharge        Equipment Recommendations  Rolling walker with 5" wheels;3in1 (PT)    Recommendations for Other Services OT consult  Frequency 7X/week   Progress towards PT Goals Progress towards PT goals: Progressing toward goals  Plan Current plan remains appropriate    Precautions / Restrictions Precautions Precautions: Fall Restrictions Weight Bearing Restrictions: Yes LLE Weight Bearing: Weight bearing as tolerated   Pertinent Vitals/Pain 4-5/10 with activity.     Mobility  Bed Mobility Overal bed mobility: Needs Assistance Bed Mobility: Supine to Sit Supine to sit: Supervision Sit to supine: Min assist General bed mobility comments: not assesed Transfers Overall transfer level: Needs assistance Equipment used: Rolling walker (2 wheeled) Transfers: Sit to/from Stand Sit to Stand: Supervision General transfer comment: cues for hand placement and sequencing for transfers  Ambulation/Gait Ambulation/Gait assistance: Supervision Ambulation Distance (Feet): 10 Feet Assistive device: Rolling walker (2 wheeled) Gait Pattern/deviations: Antalgic;Decreased stance time - left;Decreased step length - right;Step-through  pattern;Narrow base of support Gait velocity: decreased; cues to incr Gait velocity interpretation: Below normal speed for age/gender General Gait Details: ambulated short distance from chair to steps to practice; pt fatigued from earlier walk  Stairs: Yes Stairs assistance: Min assist Stair Management: No rails;Backwards;With walker;Step to pattern Number of Stairs: 2 (x2) General stair comments: pt given demo and handout on technique; with first trail; PT educated pt and husband on technique including guard technique; 2nd trial; the husband (A) with stair ambulation and demo teachback technique with min cues          PT Diagnosis:    PT Problem List:   PT Treatment Interventions:     PT Goals (current goals can now be found in the care plan section) Acute Rehab PT Goals Patient Stated Goal: home today PT Goal Formulation: With patient Time For Goal Achievement: 09/19/13 Potential to Achieve Goals: Good  Visit Information  Last PT Received On: 09/14/13 Assistance Needed: +1 History of Present Illness: s/p LTHA; of note, pt with bil wrist pain premorbidly    Subjective Data  Subjective: husband present and both pt and husband ready for stair training Patient Stated Goal: home today   Cognition  Cognition Arousal/Alertness: Awake/alert Behavior During Therapy: WFL for tasks assessed/performed Overall Cognitive Status: Within Functional Limits for tasks assessed    Balance  Balance Overall balance assessment: Needs assistance Standing balance support: During functional activity;Bilateral upper extremity supported Standing balance-Leahy Scale: Fair Standing balance comment: bil UEs supported by RW  End of Session PT - End of Session Equipment Utilized During Treatment: Gait belt Activity Tolerance: Patient tolerated treatment well Patient left: in chair;with call bell/phone within reach;with family/visitor present Nurse Communication: Mobility status   GP  Elie Confer [place], [place] 09/14/2013, 1:51 PM

## 2013-09-14 NOTE — Discharge Summary (Signed)
Patient ID: ZOII KALIN MRN: [id number] DOB/AGE: [birth year] 78 y.o.  Admit date: 09/12/2013 Discharge date: 09/14/2013  Admission Diagnoses:  Active Problems:   S/P total hip arthroplasty   Discharge Diagnoses:  Same  Past Medical History  Diagnosis Date  . Arthritis   . COPD (chronic obstructive pulmonary disease)   . Heart murmur   . Osteopenia   . Thyroid disease   . GERD (gastroesophageal reflux disease)     takes Aciphex daily  . PONV (postoperative nausea and vomiting)     TMJ surgery several times  . Rheumatic fever     as a child  . Shortness of breath     with exertion  . History of migraine     last one Aug 2014 and takes Imitrex daily as needed  . Neuralgia   . Neuropathy     takes gabapentin daily  . Joint pain   . Joint swelling   . IBS (irritable bowel syndrome)   . Anemia     after jaw surgery was on iron for short period of time  . Cancer     squamous cell  . History of staph infection   . Dry skin   . Laryngopharyngeal reflux   . Difficult intubation     potential for difficult airway due to limited mouth opening    Surgeries: Procedure(s): LEFT TOTAL HIP ARTHROPLASTY ANTERIOR APPROACH on 09/12/2013   Consultants:    Discharged Condition: Improved  Hospital Course: KEELEE HANKERSON is an 78 y.o. female who was admitted 09/12/2013 for operative treatment of<principal problem not specified>. Patient has severe unremitting pain that affects sleep, daily activities, and work/hobbies. After pre-op clearance the patient was taken to the operating room on 09/12/2013 and underwent  Procedure(s): LEFT TOTAL HIP ARTHROPLASTY ANTERIOR APPROACH.    Patient was given perioperative antibiotics: Anti-infectives   Start     Dose/Rate Route Frequency Ordered Stop   09/12/13 1930  vancomycin (VANCOCIN) IVPB 1000 mg/200 mL premix     1,000 mg 200 mL/hr over 60 Minutes Intravenous Every 12 hours 09/12/13 1215 09/12/13 1936   09/12/13 0600  vancomycin  (VANCOCIN) IVPB 1000 mg/200 mL premix     1,000 mg 200 mL/hr over 60 Minutes Intravenous On call to O.R. 09/11/13 1419 09/12/13 0735       Patient was given sequential compression devices, early ambulation, and chemoprophylaxis to prevent DVT.  Patient benefited maximally from hospital stay and there were no complications.    Recent vital signs: Patient Vitals for the past 24 hrs:  BP Temp Temp src Pulse Resp SpO2  09/14/13 1127 - - - - 16 98 %  09/14/13 0800 - - - - 18 98 %  09/14/13 0546 134/65 mmHg 98.7 F (37.1 C) Oral 70 16 96 %  09/13/13 1946 143/56 mmHg 98.2 F (36.8 C) Oral 92 16 97 %  09/13/13 1333 148/51 mmHg 98.6 F (37 C) Oral 78 17 98 %  09/13/13 1200 - - - - 17 98 %     Recent laboratory studies:  Recent Labs  09/13/13 0430 09/14/13 0411  WBC 7.5 7.2  HGB 12.4 11.5*  HCT 36.5 34.1*  PLT 229 212  NA 134*  --   K 4.1  --   CL 97  --   CO2 24  --   BUN 15  --   CREATININE 0.74  --   GLUCOSE 128*  --   CALCIUM 8.9  --  Discharge Medications:     Medication List         ACIPHEX 20 MG tablet  Generic drug:  RABEprazole  Take 20-40 mg by mouth 2 (two) times daily. Takes 1 tablet in am and 2 tablets in pm     aspirin 325 MG EC tablet  Take 1 tablet (325 mg total) by mouth 2 (two) times daily after a meal.     CALCIUM 1200 PO  Take 1 tablet by mouth.     calcium carbonate 1250 MG tablet  Commonly known as:  OS-CAL - dosed in mg of elemental calcium  Take 1 tablet by mouth 2 (two) times daily with a meal.     cholecalciferol 1000 UNITS tablet  Commonly known as:  VITAMIN D  Take 1,000 Units by mouth daily.     estradiol 1 MG tablet  Commonly known as:  ESTRACE  Take 1 mg by mouth daily.     gabapentin 300 MG capsule  Commonly known as:  NEURONTIN  Take 300 mg by mouth 3 (three) times daily.     glucosamine-chondroitin 500-400 MG tablet  Take 2,000 tablets by mouth 3 (three) times daily.     methocarbamol 500 MG tablet  Commonly  known as:  ROBAXIN  Take 1 tablet (500 mg total) by mouth every 6 (six) hours as needed for muscle spasms.     SUMAtriptan 25 MG tablet  Commonly known as:  IMITREX  Take 25 mg by mouth every 2 (two) hours as needed for migraine.     traMADol 50 MG tablet  Commonly known as:  ULTRAM  Take 1 tablet (50 mg total) by mouth every 6 (six) hours as needed.     vitamin B-12 1000 MCG tablet  Commonly known as:  CYANOCOBALAMIN  Take 1,000 mcg by mouth daily.     vitamin E 1000 UNIT capsule  Take 1,000 Units by mouth daily.        Diagnostic Studies: Dg Chest 2 View  09/01/2013   CLINICAL DATA:  COPD, preoperative evaluation for hip replacement  EXAM: CHEST  2 VIEW  COMPARISON:  09/21/2008  FINDINGS: The cardiac shadow is within normal limits. A nodular density is again seen in the right upper lobe posteriorly stable from the previous CT examination. A calcified granuloma is noted in the left lung base also stable from the prior CT examination. No new focal abnormality is seen. Marland Kitchen  IMPRESSION: No acute abnormality noted.   Electronically Signed   By: Inez Catalina M.D.   On: 09/01/2013 16:16   Dg Hip Operative Left  09/12/2013   CLINICAL DATA:  Arthritis the left hip.  EXAM: OPERATIVE LEFT HIP  COMPARISON:  None.  FINDINGS: AP C-arm images demonstrate the patient has undergone left total hip prosthesis insertion. The prosthesis appears in excellent position in the AP projection with no fractures.  IMPRESSION: Satisfactory appearance of the left hip in the AP projection after total hip prosthesis insertion.  Signed rib   Electronically Signed   By: Rozetta Nunnery M.D.   On: 09/12/2013 09:34    Disposition: Final discharge disposition not confirmed      Discharge Orders   Future Orders Complete By Expires   Call MD / Call 911  As directed    Comments:     If you experience chest pain or shortness of breath, CALL 911 and be transported to the hospital emergency room.  If you develope a fever above  101 F,  pus (white drainage) or increased drainage or redness at the wound, or calf pain, call your surgeon's office.   Constipation Prevention  As directed    Comments:     Drink plenty of fluids.  Prune juice may be helpful.  You may use a stool softener, such as Colace (over the counter) 100 mg twice a day.  Use MiraLax (over the counter) for constipation as needed.   Diet - low sodium heart healthy  As directed    Increase activity slowly as tolerated  As directed       Follow-up Information   Follow up with DALLDORF,PETER G, MD. Call in 2 weeks.   Specialty:  Orthopedic Surgery   Contact information:   [place] [address] [phone]        Signed: Dione Housekeeper 09/14/2013, 11:44 AM

## 2013-09-14 NOTE — Discharge Instructions (Signed)
Continue ice and elevation. ASA 3251 pill twice a day x4 weeks. May change dressing as needed. Weightbearing as tolerated. Return to office in 2 weeks. [phone].  Home Health Physical therapy to be provided by [facility] professionals [phone]

## 2013-09-14 NOTE — Care Management Note (Signed)
09/14/13 [place], RN BSN Case Manager [phone] Case Manager received call stating patient was preoperatively setup with [facility], Case Manager notified [facility] and spoke with patient concerning same.

## 2013-09-14 NOTE — Progress Notes (Signed)
Subjective: 2 Days Post-Op Procedure(s) (LRB): LEFT TOTAL HIP ARTHROPLASTY ANTERIOR APPROACH (Left)  Activity level:  wbat Diet tolerance:  ok Voiding:  ok Patient reports pain as 1 on 0-10 scale.    Objective: Vital signs in last 24 hours: Temp:  [98.2 F (36.8 C)-98.7 F (37.1 C)] 98.7 F (37.1 C) (02/12 0546) Pulse Rate:  [70-92] 70 (02/12 0546) Resp:  [16-18] 16 (02/12 1127) BP: (134-148)/(51-65) 134/65 mmHg (02/12 0546) SpO2:  [96 %-98 %] 98 % (02/12 1127)  Labs:  Recent Labs  09/13/13 0430 09/14/13 0411  HGB 12.4 11.5*    Recent Labs  09/13/13 0430 09/14/13 0411  WBC 7.5 7.2  RBC 4.34 4.09  HCT 36.5 34.1*  PLT 229 212    Recent Labs  09/13/13 0430  NA 134*  K 4.1  CL 97  CO2 24  BUN 15  CREATININE 0.74  GLUCOSE 128*  CALCIUM 8.9   No results found for this basename: LABPT, INR,  in the last 72 hours  Physical Exam:  Neurologically intact ABD soft Neurovascular intact Sensation intact distally Intact pulses distally Dorsiflexion/Plantar flexion intact No cellulitis present Compartment soft  Assessment/Plan:  2 Days Post-Op Procedure(s) (LRB): LEFT TOTAL HIP ARTHROPLASTY ANTERIOR APPROACH (Left) Advance diet Up with therapy Discharge home with home health Continue ASA 3251 pill twice a day x4 weeks. Prescriptions are in chart. Return to office 2 weeks. [place] 09/14/2013, 11:39 AM

## 2013-09-14 NOTE — Progress Notes (Signed)
Physical Therapy Treatment Patient Details Name: Wendy Patton MRN: [id number] DOB: 12-Nov-1935 Today's Date: 09/14/2013 Time: 0093-8182 PT Time Calculation (min): 25 min  PT Assessment / Plan / Recommendation  History of Present Illness s/p LTHA; of note, pt with bil wrist pain premorbidly   PT Comments   Pt c/o having a "bad night" last night. C/o "burning" pain in Lt LE throughout the night. Was able to progress ambulation today, continues to have gt abnormalities secondary to pain. Will plan to address stair goal with husband present, RN notified. Plans to D/C home this afternoon, will see for an additional session prior to D/C.   Follow Up Recommendations  Home health PT;Supervision/Assistance - 24 hour     Does the patient have the potential to tolerate intense rehabilitation     Barriers to Discharge        Equipment Recommendations  Rolling walker with 5" wheels;3in1 (PT)    Recommendations for Other Services OT consult  Frequency 7X/week   Progress towards PT Goals Progress towards PT goals: Progressing toward goals  Plan Current plan remains appropriate    Precautions / Restrictions Precautions Precautions: Fall Restrictions Weight Bearing Restrictions: Yes LLE Weight Bearing: Weight bearing as tolerated   Pertinent Vitals/Pain 4/10 walking; 0/10 at rest; given ice pack     Mobility  Bed Mobility Overal bed mobility: Needs Assistance Bed Mobility: Supine to Sit Supine to sit: Supervision General bed mobility comments: pt with incr difficulty advancing Lt LE to/off EOB; cues and educated on technique to hook long sheet around foot and use UEs Transfers Overall transfer level: Needs assistance Equipment used: Rolling walker (2 wheeled) Transfers: Sit to/from Stand Sit to Stand: Min guard;From elevated surface (and non elevated surface) General transfer comment: pt with incr difficulty with sit to stand from lower surfaced bed; progressed to supervision with  transfer from [facility] due to elevated surface and armrests; cues for hand placement and sequencing  Ambulation/Gait Ambulation/Gait assistance: Min guard Ambulation Distance (Feet): 200 Feet Assistive device: Rolling walker (2 wheeled) Gait Pattern/deviations: Decreased stance time - left;Decreased step length - right;Antalgic;Narrow base of support Gait velocity: decreased; cues to incr Gait velocity interpretation: Below normal speed for age/gender General Gait Details: cues for gt sequencing and upright posture; pt looking down at feet throughout ambulation; (A) and demo to increase step length on rt LE  Stairs:  (will see when husband arrives for stair training )    Exercises Total Joint Exercises Quad Sets: AROM;Strengthening;Left;10 reps;Seated Heel Slides: AAROM;Left;10 reps;Seated Long Arc Quad: AROM;Strengthening;Left;10 reps;Seated Knee Flexion: AROM;5 reps;Left;Standing   PT Diagnosis:    PT Problem List:   PT Treatment Interventions:     PT Goals (current goals can now be found in the care plan section) Acute Rehab PT Goals Patient Stated Goal: back to walk dogs and going on therapy dog visits; she also mentioned dancing PT Goal Formulation: With patient Time For Goal Achievement: 09/19/13 Potential to Achieve Goals: Good  Visit Information  Last PT Received On: 09/14/13 Assistance Needed: +1 History of Present Illness: s/p LTHA; of note, pt with bil wrist pain premorbidly    Subjective Data  Subjective: "i had a horrible night. worst pain ive ever had. it was just burning." requesting to do steps in session with husband who is not present at the moment  Patient Stated Goal: back to walk dogs and going on therapy dog visits; she also mentioned dancing   Cognition  Cognition Arousal/Alertness: Awake/alert Behavior During Therapy: [facility]  for tasks assessed/performed Overall Cognitive Status: Within Functional Limits for tasks assessed    Balance  Balance Overall  balance assessment: Needs assistance Sitting-balance support: Feet supported;No upper extremity supported Sitting balance-Leahy Scale: Good Standing balance support: During functional activity;Bilateral upper extremity supported Standing balance-Leahy Scale: Fair  End of Session PT - End of Session Equipment Utilized During Treatment: Gait belt Activity Tolerance: Patient tolerated treatment well Patient left: in chair;with call bell/phone within reach Nurse Communication: Mobility status   GP     Gustavus Bryant, [place] 09/14/2013, 8:43 AM

## 2013-10-10 ENCOUNTER — Encounter: Payer: Self-pay | Admitting: Orthopaedic Surgery

## 2013-11-01 ENCOUNTER — Encounter: Payer: Self-pay | Admitting: Orthopaedic Surgery

## 2013-12-01 ENCOUNTER — Encounter: Payer: Self-pay | Admitting: Orthopaedic Surgery

## 2013-12-19 ENCOUNTER — Encounter (INDEPENDENT_AMBULATORY_CARE_PROVIDER_SITE_OTHER): Payer: Medicare Other | Admitting: General Surgery

## 2014-01-01 ENCOUNTER — Encounter (INDEPENDENT_AMBULATORY_CARE_PROVIDER_SITE_OTHER): Payer: Self-pay | Admitting: General Surgery

## 2014-01-01 ENCOUNTER — Ambulatory Visit (INDEPENDENT_AMBULATORY_CARE_PROVIDER_SITE_OTHER): Payer: 59 | Admitting: General Surgery

## 2014-01-01 VITALS — BP 130/80 | HR 60 | Temp 97.9°F | Resp 14 | Ht 69.0 in | Wt 142.6 lb

## 2014-01-01 DIAGNOSIS — N6452 Nipple discharge: Secondary | ICD-10-CM

## 2014-01-01 DIAGNOSIS — N6459 Other signs and symptoms in breast: Secondary | ICD-10-CM

## 2014-01-01 NOTE — Patient Instructions (Signed)
IF YOU ARE TAKING ASPIRIN, COUMADIN/WARFARIN, PLAVIX, OR OTHER BLOOD THINNER, PLEASE LET US KNOW IMMEDIATELY.  WE WILL NEED TO DISCUSS WITH THE PRESCRIBING PROVIDER IF THESE ARE SAFE TO STOP. IF THESE ARE NOT STOPPED AT THE APPROPRIATE TIME, THIS WILL RESULT IN A DELAY FOR YOUR SURGERY.  DO NOT TAKE THESE MEDICATIONS OR IBUPROFEN/NAPROXEN WITHIN A WEEK BEFORE SURGERY.   The main risks of surgery are bleeding, infection, damage to other structures, and seroma (accumulation of fluid) under the incision site(s).    These complications may lead to additional procedures such as drainage of seroma/infection.  If cancer is found, you may need other surgeries to obtain negative margins or to take more lymph nodes.   Most women do accumulate fluid in the breast cavity where the specimen was removed. We do not always have to drain this fluid.  If your breast is very tense, painful, or red, then we may need to numb the skin and use a needle to aspirate the fluid.  We do provide patients with a Breast Binder.  The purpose of this is to avoid the use of tape on the sensitive tissue of the breast and to provide some compression to minimize the risk of seroma.  If the binder is uncomfortable, you may find that a tank top with a built-in shelf bra or a loose sports bra works better for you.  I recommend wearing this around the clock for the first 1-2 weeks except in the shower.    You may remove your dressings and may shower 48 hours after surgery.    Many patients have some constipation in the week after surgery due to the narcotics and anesthesia.  You may need over the counter stool softeners or laxatives if you experience difficulty having bowel movements.    If the following occur, call our office at [phone]: If you have a fever over 101 or pain that is severe despite narcotics. If you have redness or drainage at the wound. If you develop persistent nausea or vomiting.  I will follow you back up in  1-4 weeks.    Please submit any paperwork about time off work/insurance forms to the front desk.      

## 2014-01-03 ENCOUNTER — Encounter (HOSPITAL_COMMUNITY): Payer: Self-pay | Admitting: Pharmacy Technician

## 2014-01-03 NOTE — Patient Instructions (Signed)
Wendy Patton  01/03/2014   Your procedure is scheduled on:  01/11/2014  0730-0830am  Report to [facility].  Follow the Signs to [place] at  0530      am  Call this number if you have problems the morning of surgery: [phone]   Remember:   Do not eat food or drink liquids after midnight.   Take these medicines the morning of surgery with A SIP OF WATER:    Do not wear jewelry, make-up or nail polish.  Do not wear lotions, powders, or perfumes. , deodorant   Do not shave 48 hours prior to surgery.   Do not bring valuables to the hospital.  Contacts, dentures or bridgework may not be worn into surgery.       Patients discharged the day of surgery will not be allowed to drive  home.  Name and phone number of your driver:     Please read over the following fact sheets that you were given: [facility] - Preparing for Surgery Before surgery, you can play an important role.  Because skin is not sterile, your skin needs to be as free of germs as possible.  You can reduce the number of germs on your skin by washing with CHG (chlorahexidine gluconate) soap before surgery.  CHG is an antiseptic cleaner which kills germs and bonds with the skin to continue killing germs even after washing. Please DO NOT use if you have an allergy to CHG or antibacterial soaps.  If your skin becomes reddened/irritated stop using the CHG and inform your nurse when you arrive at Short Stay. Do not shave (including legs and underarms) for at least 48 hours prior to the first CHG shower.  You may shave your face/neck. Please follow these instructions carefully:  1.  Shower with CHG Soap the night before surgery and the  morning of Surgery.  2.  If you choose to wash your hair, wash your hair first as usual with your  normal  shampoo.  3.  After you shampoo, rinse your hair and body thoroughly to remove the  shampoo.                           4.  Use CHG as you would any other liquid soap.  You  can apply chg directly  to the skin and wash                       Gently with a scrungie or clean washcloth.  5.  Apply the CHG Soap to your body ONLY FROM THE NECK DOWN.   Do not use on face/ open                           Wound or open sores. Avoid contact with eyes, ears mouth and genitals (private parts).                       Wash face,  Genitals (private parts) with your normal soap.             6.  Wash thoroughly, paying special attention to the area where your surgery  will be performed.  7.  Thoroughly rinse your body with warm water from the neck down.  8.  DO NOT shower/wash with your normal soap after using and rinsing off  the  CHG Soap.                9.  Pat yourself dry with a clean towel.            10.  Wear clean pajamas.            11.  Place clean sheets on your bed the night of your first shower and do not  sleep with pets. Day of Surgery : Do not apply any lotions/deodorants the morning of surgery.  Please wear clean clothes to the hospital/surgery center.  FAILURE TO FOLLOW THESE INSTRUCTIONS MAY RESULT IN THE CANCELLATION OF YOUR SURGERY PATIENT SIGNATURE_________________________________  NURSE SIGNATURE__________________________________  ________________________________________________________________________  coughing and deep breathing exercises, leg exercises

## 2014-01-04 ENCOUNTER — Encounter (HOSPITAL_COMMUNITY)
Admission: RE | Admit: 2014-01-04 | Discharge: 2014-01-04 | Disposition: A | Discharge status: Home / Self Care | Payer: Medicare PPO | Source: Ambulatory Visit | Attending: General Surgery | Admitting: General Surgery

## 2014-01-04 ENCOUNTER — Encounter (HOSPITAL_COMMUNITY): Payer: Self-pay

## 2014-01-04 DIAGNOSIS — Z01812 Encounter for preprocedural laboratory examination: Secondary | ICD-10-CM | POA: Insufficient documentation

## 2014-01-04 HISTORY — DX: Headache: R51

## 2014-01-04 LAB — CBC
HEMATOCRIT: 40.7 % (ref 36.0–46.0)
Hemoglobin: 13.3 g/dL (ref 12.0–15.0)
MCH: 26.2 pg (ref 26.0–34.0)
MCHC: 32.7 g/dL (ref 30.0–36.0)
MCV: 80.3 fL (ref 78.0–100.0)
Platelets: 269 10*3/uL (ref 150–400)
RBC: 5.07 MIL/uL (ref 3.87–5.11)
RDW: 14.7 % (ref 11.5–15.5)
WBC: 4.7 10*3/uL (ref 4.0–10.5)

## 2014-01-04 NOTE — Progress Notes (Signed)
Preop anesthesia consult done to due hx of small airway.  Completed by Dr Kalman Shan.

## 2014-01-04 NOTE — Anesthesia Preprocedure Evaluation (Addendum)
Anesthesia Evaluation  Patient identified by MRN, date of birth, ID band Patient awake    Reviewed: Allergy & Precautions, H&P , NPO status , Patient's Chart, lab work & pertinent test results  History of Anesthesia Complications (+) PONV and DIFFICULT AIRWAY  Airway Mallampati: IV TM Distance: >3 FB Neck ROM: Full  Mouth opening: Limited Mouth Opening  Dental  (+) Chipped, Dental Advisory Given Right upper front tooth chipped:   Pulmonary shortness of breath and with exertion, COPD Several TMJ surgeries breath sounds clear to auscultation  Pulmonary exam normal       Cardiovascular Exercise Tolerance: Good negative cardio ROS  Rhythm:Regular Rate:Normal  RBBB   Neuro/Psych negative neurological ROS  negative psych ROS   GI/Hepatic negative GI ROS, Neg liver ROS, GERD-  Medicated and Controlled,  Endo/Other  negative endocrine ROS  Renal/GU negative Renal ROS  negative genitourinary   Musculoskeletal negative musculoskeletal ROS (+)   Abdominal   Peds negative pediatric ROS (+)  Hematology negative hematology ROS (+)   Anesthesia Other Findings   Reproductive/Obstetrics negative OB ROS                        Anesthesia Physical Anesthesia Plan  ASA: III  Anesthesia Plan: General   Post-op Pain Management:    Induction: Intravenous  Airway Management Planned: LMA  Additional Equipment:   Intra-op Plan:   Post-operative Plan:   Informed Consent: I have reviewed the patients History and Physical, chart, labs and discussed the procedure including the risks, benefits and alternatives for the proposed anesthesia with the patient or authorized representative who has indicated his/her understanding and acceptance.   Dental advisory given  Plan Discussed with: CRNA and Surgeon  Anesthesia Plan Comments: (Discussed LMA vs AFOI with patient. Patient comfortable with either plan)       Anesthesia Quick Evaluation

## 2014-01-06 DIAGNOSIS — N6452 Nipple discharge: Secondary | ICD-10-CM | POA: Insufficient documentation

## 2014-01-06 NOTE — Progress Notes (Signed)
Chief Complaint  Patient presents with  . Nipple discharge    HISTORY: Pt is a 78 yo F who has had right nipple discharge on and off for 5-6 months.  It is clear in character.  She underwent mammogram/ultrasound, and no lesions were seen, but she had ductal ectasia behind the nipple.  The discharge resolved, but came back several months later.  Films were repeated and were negative, but this time a ductogram was performed. There was a fillign defect around 3x1 mm around 2.8 cm from nipple at 9 o'clock position.  This is suspected to be a intraductal papilloma.    Of note, she previously had bloody nipple discharge on the other breast and required surgery for that which was benign.  She has no family history of breast/ovarian/colon cancer.    Past Medical History  Diagnosis Date  . Arthritis   . COPD (chronic obstructive pulmonary disease)   . Heart murmur   . Osteopenia   . Thyroid disease   . GERD (gastroesophageal reflux disease)     takes Aciphex daily  . PONV (postoperative nausea and vomiting)     TMJ surgery several times  . Rheumatic fever     as a child  . Shortness of breath     with exertion  . History of migraine     last one Aug 2014 and takes Imitrex daily as needed  . Neuralgia   . Neuropathy     takes gabapentin daily  . Joint pain   . Joint swelling   . IBS (irritable bowel syndrome)   . Anemia     after jaw surgery was on iron for short period of time  . Cancer     squamous cell  . History of staph infection   . Dry skin   . Laryngopharyngeal reflux   . Difficult intubation     potential for difficult airway due to limited mouth opening  . Headache(784.0)     hx of migraines     Past Surgical History  Procedure Laterality Date  . Tubal ligation    . Cystectomy  1962    vaginal cyst removal  . Cesarean section  1967  . Breat excision  1975    benign breast tumor excision  . Cervial polyps  1999  . Trapezium resection  2000, 2004    removal right  & left hand trapezium  . Granuloma  2002    chin granuloma removed  . Squamous cell carcinoma excision  2008    left ankle/skin graft  . Uterine polyps  2008  . Abdominal hysterectomy  2009  . Breast surgery  1975, 2010    benign breast tumor  . Dilation and curettage of uterus  1984  . Thyroidectomy  2006    left  . Mandible fracture surgery  1980, 1984, 1986, 1993, 2002, 2004    prosthesis, condyles implants, fossa replaced  . Tonsillectomy and adenoidectomy  1944  . Vaginal cyst removed  1962  . Temporomandibular joint surgery  1984  . Sigmoidoscopy  1993  . Colonoscopy    . Esophagogastroduodenoscopy    . Total hip arthroplasty Left 09/12/2013    Procedure: LEFT TOTAL HIP ARTHROPLASTY ANTERIOR APPROACH;  Surgeon: [name], MD;  Location: [facility];  Service: Orthopedics;  Laterality: Left;    Current Outpatient Prescriptions  Medication Sig Dispense Refill  . ACIPHEX 20 MG tablet Take 20-40 mg by mouth 2 (two) times daily. Takes 2 tablet in  am and 1 tablets in pm      . cholecalciferol (VITAMIN D) 1000 UNITS tablet Take 1,000 Units by mouth daily.      Marland Kitchen estradiol (ESTRACE) 1 MG tablet Take 1 mg by mouth daily.       . SUMAtriptan (IMITREX) 25 MG tablet Take 25 mg by mouth every 2 (two) hours as needed for migraine.       . vitamin B-12 (CYANOCOBALAMIN) 1000 MCG tablet Take 1,000 mcg by mouth daily.      . vitamin E 1000 UNIT capsule Take 1,000 Units by mouth daily.        . Calcium Carb-Cholecalciferol (CALCIUM 600 + D PO) Take 1 tablet by mouth 2 (two) times daily.      Marland Kitchen gabapentin (NEURONTIN) 400 MG capsule Take 400 mg by mouth 3 (three) times daily.      . Glucosamine HCl 1500 MG TABS Take 2,000 mg by mouth daily.        No current facility-administered medications for this visit.     Allergies  Allergen Reactions  . Avelox [Moxifloxacin Hcl In Nacl]     C-diff  . Nitrofurantoin Monohyd Macro Hives  . Percocet [Oxycodone-Acetaminophen] Hives  . Pulmicort  Turbuhaler [Budesonide] Other (See Comments)    Blisters in mouth   . Amoxicillin Rash  . Chlorhexidine Rash  . Clindamycin/Lincomycin Rash  . Doxycycline Rash  . Entex Hives and Rash  . Hydrocodone Rash  . Tavist-D [Albertsons Dayhist-D] Rash  . Tizanidine Rash  . Witch Hazel Rash     No family history on file.   History   Social History  . Marital Status: Married    Spouse Name: N/A    Number of Children: N/A  . Years of Education: N/A   Social History Main Topics  . Smoking status: Never Smoker   . Smokeless tobacco: Never Used  . Alcohol Use: No  . Drug Use: No  . Sexual Activity: Yes    Birth Control/ Protection: Surgical   Other Topics Concern  . None   Social History Narrative  . None     REVIEW OF SYSTEMS - PERTINENT POSITIVES ONLY: 12 point review of systems negative other than HPI and PMH except for joint pain  EXAM: Filed Vitals:   01/01/14 1321  BP: 130/80  Pulse: 60  Temp: 97.9 F (36.6 C)  Resp: 14    Wt Readings from Last 3 Encounters:  01/04/14 140 lb (63.504 kg)  01/01/14 142 lb 9.6 oz (64.683 kg)  09/13/13 143 lb (64.864 kg)     Gen:  No acute distress.  Well nourished and well groomed.   Neurological: Alert and oriented to person, place, and time. Coordination normal.  Head: Normocephalic and atraumatic.  Eyes: Conjunctivae are normal. Pupils are equal, round, and reactive to light. No scleral icterus.  Neck: Normal range of motion. Neck supple. No tracheal deviation or thyromegaly present.  Cardiovascular: Normal rate, regular rhythm, normal heart sounds and intact distal pulses.  Exam reveals no gallop and no friction rub.  No murmur heard. Breast: clear discharge elicited with pressure on right breast.  No masses or LAD palpable.  No nipple retraction or skin dimpling.   Respiratory: Effort normal.  No respiratory distress. No chest wall tenderness. Breath sounds normal.  No wheezes, rales or rhonchi.  GI: Soft. Bowel sounds  are normal. The abdomen is soft and nontender.  There is no rebound and no guarding.  Musculoskeletal: Normal range of  motion. Extremities are nontender.  Lymphadenopathy: No cervical, preauricular, postauricular or axillary adenopathy is present Skin: Skin is warm and dry. No rash noted. No diaphoresis. No erythema. No pallor. No clubbing, cyanosis, or edema.   Psychiatric: Normal mood and affect. Behavior is normal. Judgment and thought content normal.    LABORATORY RESULTS: Available labs are reviewed   Recent Results (from the past 2160 hour(s))  CBC     Status: None   Collection Time    01/04/14  9:35 AM      Result Value Ref Range   WBC 4.7  4.0 - 10.5 K/uL   RBC 5.07  3.87 - 5.11 MIL/uL   Hemoglobin 13.3  12.0 - 15.0 g/dL   HCT 40.7  36.0 - 46.0 %   MCV 80.3  78.0 - 100.0 fL   MCH 26.2  26.0 - 34.0 pg   MCHC 32.7  30.0 - 36.0 g/dL   RDW 14.7  11.5 - 15.5 %   Platelets 269  150 - 400 K/uL     RADIOLOGY RESULTS: See E-Chart or I-Site for most recent results.  Images and reports are reviewed.  No results found.    ASSESSMENT AND PLAN: Nipple discharge This appears physiologic in color, but patient appears to have a papilloma and persistent discharge.  Will plan major duct excision.  Discussed surgery with patient.  Will plan outpt surgery.  Reviewed the process, risks, and benefits.    Reviewed post op recovery.       Milus Height MD Surgical Oncology[facility]      Visit Diagnoses: 1. Nipple discharge     Primary Care Physician: Margarita Rana, MD

## 2014-01-06 NOTE — Assessment & Plan Note (Signed)
This appears physiologic in color, but patient appears to have a papilloma and persistent discharge.  Will plan major duct excision.  Discussed surgery with patient.  Will plan outpt surgery.  Reviewed the process, risks, and benefits.    Reviewed post op recovery.

## 2014-01-11 ENCOUNTER — Ambulatory Visit (HOSPITAL_COMMUNITY): Payer: Medicare PPO | Admitting: Anesthesiology

## 2014-01-11 ENCOUNTER — Encounter (HOSPITAL_COMMUNITY): Payer: Self-pay | Admitting: *Deleted

## 2014-01-11 ENCOUNTER — Encounter (HOSPITAL_COMMUNITY): Payer: Medicare PPO | Admitting: Anesthesiology

## 2014-01-11 ENCOUNTER — Ambulatory Visit (HOSPITAL_COMMUNITY)
Admission: RE | Admit: 2014-01-11 | Discharge: 2014-01-11 | Disposition: A | Discharge status: Home / Self Care | Payer: Medicare PPO | Source: Ambulatory Visit | Attending: General Surgery | Admitting: General Surgery

## 2014-01-11 ENCOUNTER — Encounter (HOSPITAL_COMMUNITY)
Admission: RE | Disposition: A | Discharge status: Home / Self Care | Payer: Self-pay | Source: Ambulatory Visit | Attending: General Surgery

## 2014-01-11 DIAGNOSIS — D249 Benign neoplasm of unspecified breast: Secondary | ICD-10-CM | POA: Insufficient documentation

## 2014-01-11 DIAGNOSIS — G589 Mononeuropathy, unspecified: Secondary | ICD-10-CM | POA: Insufficient documentation

## 2014-01-11 DIAGNOSIS — Z8589 Personal history of malignant neoplasm of other organs and systems: Secondary | ICD-10-CM | POA: Insufficient documentation

## 2014-01-11 DIAGNOSIS — J4489 Other specified chronic obstructive pulmonary disease: Secondary | ICD-10-CM | POA: Insufficient documentation

## 2014-01-11 DIAGNOSIS — Z96649 Presence of unspecified artificial hip joint: Secondary | ICD-10-CM | POA: Insufficient documentation

## 2014-01-11 DIAGNOSIS — J449 Chronic obstructive pulmonary disease, unspecified: Secondary | ICD-10-CM | POA: Insufficient documentation

## 2014-01-11 DIAGNOSIS — M129 Arthropathy, unspecified: Secondary | ICD-10-CM | POA: Insufficient documentation

## 2014-01-11 DIAGNOSIS — G43909 Migraine, unspecified, not intractable, without status migrainosus: Secondary | ICD-10-CM | POA: Insufficient documentation

## 2014-01-11 DIAGNOSIS — K219 Gastro-esophageal reflux disease without esophagitis: Secondary | ICD-10-CM | POA: Insufficient documentation

## 2014-01-11 DIAGNOSIS — Z79899 Other long term (current) drug therapy: Secondary | ICD-10-CM | POA: Insufficient documentation

## 2014-01-11 DIAGNOSIS — N6019 Diffuse cystic mastopathy of unspecified breast: Secondary | ICD-10-CM

## 2014-01-11 DIAGNOSIS — N6089 Other benign mammary dysplasias of unspecified breast: Secondary | ICD-10-CM

## 2014-01-11 HISTORY — PX: BREAST DUCTAL SYSTEM EXCISION: SHX5242

## 2014-01-11 SURGERY — EXCISION DUCTAL SYSTEM BREAST
Anesthesia: General | Site: Breast | Laterality: Right

## 2014-01-11 MED ORDER — LIDOCAINE HCL (PF) 1 % IJ SOLN
INTRAMUSCULAR | Status: DC | PRN
  Administered 2014-01-11: 20 mL

## 2014-01-11 MED ORDER — SODIUM CHLORIDE 0.9 % IJ SOLN: 3.0000 mL | Freq: Two times a day (BID) | INTRAMUSCULAR | Status: DC

## 2014-01-11 MED ORDER — ONDANSETRON HCL 4 MG/2ML IJ SOLN
INTRAMUSCULAR | Status: AC
  Filled 2014-01-11: qty 2

## 2014-01-11 MED ORDER — BUPIVACAINE-EPINEPHRINE 0.25% -1:200000 IJ SOLN
INTRAMUSCULAR | Status: DC | PRN
  Administered 2014-01-11: 30 mL

## 2014-01-11 MED ORDER — VANCOMYCIN HCL IN DEXTROSE 1-5 GM/200ML-% IV SOLN
INTRAVENOUS | Status: AC
  Filled 2014-01-11: qty 200

## 2014-01-11 MED ORDER — FENTANYL CITRATE 0.05 MG/ML IJ SOLN
INTRAMUSCULAR | Status: DC | PRN
  Administered 2014-01-11: 100 ug via INTRAVENOUS

## 2014-01-11 MED ORDER — PROPOFOL 10 MG/ML IV BOLUS
INTRAVENOUS | Status: AC
  Filled 2014-01-11: qty 20

## 2014-01-11 MED ORDER — METOCLOPRAMIDE HCL 5 MG/ML IJ SOLN
INTRAMUSCULAR | Status: AC
  Filled 2014-01-11: qty 2

## 2014-01-11 MED ORDER — KETOROLAC TROMETHAMINE 15 MG/ML IJ SOLN
INTRAMUSCULAR | Status: AC
  Filled 2014-01-11: qty 1

## 2014-01-11 MED ORDER — LACTATED RINGERS IV SOLN
INTRAVENOUS | Status: DC | PRN
  Administered 2014-01-11: 07:00:00 via INTRAVENOUS

## 2014-01-11 MED ORDER — PROPOFOL 10 MG/ML IV BOLUS
INTRAVENOUS | Status: DC | PRN
  Administered 2014-01-11: 110 mg via INTRAVENOUS

## 2014-01-11 MED ORDER — DEXAMETHASONE SODIUM PHOSPHATE 10 MG/ML IJ SOLN
INTRAMUSCULAR | Status: AC
  Filled 2014-01-11: qty 1

## 2014-01-11 MED ORDER — FENTANYL CITRATE 0.05 MG/ML IJ SOLN: 25.0000 ug | INTRAMUSCULAR | Status: DC | PRN

## 2014-01-11 MED ORDER — LACTATED RINGERS IV SOLN: INTRAVENOUS | Status: DC

## 2014-01-11 MED ORDER — ONDANSETRON HCL 4 MG/2ML IJ SOLN
INTRAMUSCULAR | Status: DC | PRN
  Administered 2014-01-11: 4 mg via INTRAVENOUS

## 2014-01-11 MED ORDER — PHENYLEPHRINE HCL 10 MG/ML IJ SOLN
INTRAMUSCULAR | Status: DC | PRN
  Administered 2014-01-11: 60 ug via INTRAVENOUS

## 2014-01-11 MED ORDER — BUPIVACAINE-EPINEPHRINE (PF) 0.25% -1:200000 IJ SOLN
INTRAMUSCULAR | Status: AC
  Filled 2014-01-11: qty 30

## 2014-01-11 MED ORDER — LACTATED RINGERS IV SOLN
INTRAVENOUS | Status: DC
Start: 2014-01-11 — End: 2014-01-11

## 2014-01-11 MED ORDER — VANCOMYCIN HCL IN DEXTROSE 1-5 GM/200ML-% IV SOLN
1000.0000 mg | INTRAVENOUS | Status: AC
  Administered 2014-01-11: 1000 mg via INTRAVENOUS

## 2014-01-11 MED ORDER — KETOROLAC TROMETHAMINE 15 MG/ML IJ SOLN
15.0000 mg | Freq: Four times a day (QID) | INTRAMUSCULAR | Status: DC
  Administered 2014-01-11: 15 mg via INTRAVENOUS

## 2014-01-11 MED ORDER — SODIUM CHLORIDE 0.9 % IJ SOLN: 3.0000 mL | INTRAMUSCULAR | Status: DC | PRN

## 2014-01-11 MED ORDER — PHENYLEPHRINE 40 MCG/ML (10ML) SYRINGE FOR IV PUSH (FOR BLOOD PRESSURE SUPPORT)
PREFILLED_SYRINGE | INTRAVENOUS | Status: AC
  Filled 2014-01-11: qty 10

## 2014-01-11 MED ORDER — FENTANYL CITRATE 0.05 MG/ML IJ SOLN
INTRAMUSCULAR | Status: AC
  Filled 2014-01-11: qty 5

## 2014-01-11 MED ORDER — ACETAMINOPHEN 650 MG RE SUPP
650.0000 mg | RECTAL | Status: DC | PRN
  Filled 2014-01-11: qty 1

## 2014-01-11 MED ORDER — LIDOCAINE HCL 1 % IJ SOLN
INTRAMUSCULAR | Status: AC
  Filled 2014-01-11: qty 20

## 2014-01-11 MED ORDER — TRAMADOL HCL 50 MG PO TABS: 50.0000 mg | ORAL_TABLET | Freq: Four times a day (QID) | ORAL | Status: DC | PRN

## 2014-01-11 MED ORDER — SODIUM CHLORIDE 0.9 % IV SOLN: 250.0000 mL | INTRAVENOUS | Status: DC | PRN

## 2014-01-11 MED ORDER — METOCLOPRAMIDE HCL 5 MG/ML IJ SOLN
INTRAMUSCULAR | Status: DC | PRN
  Administered 2014-01-11: 10 mg via INTRAVENOUS

## 2014-01-11 MED ORDER — ACETAMINOPHEN 325 MG PO TABS: 650.0000 mg | ORAL_TABLET | ORAL | Status: DC | PRN

## 2014-01-11 SURGICAL SUPPLY — 49 items
BANDAGE ELASTIC 6 VELCRO ST LF (GAUZE/BANDAGES/DRESSINGS) IMPLANT
BINDER BREAST LRG (GAUZE/BANDAGES/DRESSINGS) ×3 IMPLANT
BLADE SURG 15 STRL LF DISP TIS (BLADE) ×1 IMPLANT
BLADE SURG 15 STRL SS (BLADE) ×2
BLADE SURG SZ10 CARB STEEL (BLADE) ×3 IMPLANT
BNDG COHESIVE 4X5 TAN STRL (GAUZE/BANDAGES/DRESSINGS) IMPLANT
BNDG GAUZE ELAST 4 BULKY (GAUZE/BANDAGES/DRESSINGS) IMPLANT
CANISTER SUCTION 2500CC (MISCELLANEOUS) IMPLANT
CLIP TI WIDE RED SMALL 6 (CLIP) IMPLANT
CONT SPEC 4OZ CLIKSEAL STRL BL (MISCELLANEOUS) IMPLANT
DECANTER SPIKE VIAL GLASS SM (MISCELLANEOUS) ×3 IMPLANT
DERMABOND ADVANCED (GAUZE/BANDAGES/DRESSINGS) ×2
DERMABOND ADVANCED .7 DNX12 (GAUZE/BANDAGES/DRESSINGS) ×1 IMPLANT
DRAIN CHANNEL RND F F (WOUND CARE) IMPLANT
ELECT REM PT RETURN 9FT ADLT (ELECTROSURGICAL) ×3
ELECTRODE REM PT RTRN 9FT ADLT (ELECTROSURGICAL) ×1 IMPLANT
EVACUATOR SILICONE 100CC (DRAIN) IMPLANT
GAUZE SPONGE 4X4 12PLY STRL (GAUZE/BANDAGES/DRESSINGS) IMPLANT
GAUZE SPONGE 4X4 16PLY XRAY LF (GAUZE/BANDAGES/DRESSINGS) IMPLANT
GLOVE BIO SURGEON STRL SZ 6 (GLOVE) ×6 IMPLANT
GLOVE BIOGEL PI IND STRL 6.5 (GLOVE) ×1 IMPLANT
GLOVE BIOGEL PI IND STRL 7.0 (GLOVE) ×1 IMPLANT
GLOVE BIOGEL PI INDICATOR 6.5 (GLOVE) ×2
GLOVE BIOGEL PI INDICATOR 7.0 (GLOVE) ×2
GLOVE INDICATOR 6.5 STRL GRN (GLOVE) ×6 IMPLANT
GOWN SPEC L4 XLG W/TWL (GOWN DISPOSABLE) ×3 IMPLANT
GOWN STRL REUS W/ TWL XL LVL3 (GOWN DISPOSABLE) IMPLANT
GOWN STRL REUS W/TWL 2XL LVL3 (GOWN DISPOSABLE) ×3 IMPLANT
GOWN STRL REUS W/TWL XL LVL3 (GOWN DISPOSABLE)
KIT BASIN OR (CUSTOM PROCEDURE TRAY) ×3 IMPLANT
MANIFOLD NEPTUNE II (INSTRUMENTS) ×3 IMPLANT
MARKER SKIN DUAL TIP RULER LAB (MISCELLANEOUS) IMPLANT
NEEDLE HYPO 22GX1.5 SAFETY (NEEDLE) IMPLANT
NEEDLE SPNL 22GX3.5 QUINCKE BK (NEEDLE) ×3 IMPLANT
NS IRRIG 1000ML POUR BTL (IV SOLUTION) ×3 IMPLANT
PACK GENERAL/GYN (CUSTOM PROCEDURE TRAY) ×3 IMPLANT
PACK UNIVERSAL I (CUSTOM PROCEDURE TRAY) ×3 IMPLANT
PAD ABD 8X10 STRL (GAUZE/BANDAGES/DRESSINGS) ×3 IMPLANT
SPONGE GAUZE 4X4 12PLY (GAUZE/BANDAGES/DRESSINGS) ×3 IMPLANT
SPONGE LAP 18X18 X RAY DECT (DISPOSABLE) IMPLANT
STAPLER VISISTAT 35W (STAPLE) IMPLANT
STOCKINETTE 8 INCH (MISCELLANEOUS) IMPLANT
SUT MNCRL AB 4-0 PS2 18 (SUTURE) ×3 IMPLANT
SUT VIC AB 3-0 SH 27 (SUTURE) ×2
SUT VIC AB 3-0 SH 27X BRD (SUTURE) ×1 IMPLANT
SUT VIC AB 3-0 SH 27XBRD (SUTURE) IMPLANT
SYR CONTROL 10ML LL (SYRINGE) ×3 IMPLANT
TOWEL OR 17X26 10 PK STRL BLUE (TOWEL DISPOSABLE) ×3 IMPLANT
TOWEL OR NON WOVEN STRL DISP B (DISPOSABLE) ×3 IMPLANT

## 2014-01-11 NOTE — H&P (View-Only) (Signed)
Chief Complaint  Patient presents with  . Nipple discharge    HISTORY: Pt is a 78 yo F who has had right nipple discharge on and off for 5-6 months.  It is clear in character.  She underwent mammogram/ultrasound, and no lesions were seen, but she had ductal ectasia behind the nipple.  The discharge resolved, but came back several months later.  Films were repeated and were negative, but this time a ductogram was performed. There was a fillign defect around 3x1 mm around 2.8 cm from nipple at 9 o'clock position.  This is suspected to be a intraductal papilloma.    Of note, she previously had bloody nipple discharge on the other breast and required surgery for that which was benign.  She has no family history of breast/ovarian/colon cancer.    Past Medical History  Diagnosis Date  . Arthritis   . COPD (chronic obstructive pulmonary disease)   . Heart murmur   . Osteopenia   . Thyroid disease   . GERD (gastroesophageal reflux disease)     takes Aciphex daily  . PONV (postoperative nausea and vomiting)     TMJ surgery several times  . Rheumatic fever     as a child  . Shortness of breath     with exertion  . History of migraine     last one Aug 2014 and takes Imitrex daily as needed  . Neuralgia   . Neuropathy     takes gabapentin daily  . Joint pain   . Joint swelling   . IBS (irritable bowel syndrome)   . Anemia     after jaw surgery was on iron for short period of time  . Cancer     squamous cell  . History of staph infection   . Dry skin   . Laryngopharyngeal reflux   . Difficult intubation     potential for difficult airway due to limited mouth opening  . Headache(784.0)     hx of migraines     Past Surgical History  Procedure Laterality Date  . Tubal ligation    . Cystectomy  1962    vaginal cyst removal  . Cesarean section  1967  . Breat excision  1975    benign breast tumor excision  . Cervial polyps  1999  . Trapezium resection  2000, 2004    removal right  & left hand trapezium  . Granuloma  2002    chin granuloma removed  . Squamous cell carcinoma excision  2008    left ankle/skin graft  . Uterine polyps  2008  . Abdominal hysterectomy  2009  . Breast surgery  1975, 2010    benign breast tumor  . Dilation and curettage of uterus  1984  . Thyroidectomy  2006    left  . Mandible fracture surgery  1980, 1984, 1986, 1993, 2002, 2004    prosthesis, condyles implants, fossa replaced  . Tonsillectomy and adenoidectomy  1944  . Vaginal cyst removed  1962  . Temporomandibular joint surgery  1984  . Sigmoidoscopy  1993  . Colonoscopy    . Esophagogastroduodenoscopy    . Total hip arthroplasty Left 09/12/2013    Procedure: LEFT TOTAL HIP ARTHROPLASTY ANTERIOR APPROACH;  Surgeon: [name], MD;  Location: [facility];  Service: Orthopedics;  Laterality: Left;    Current Outpatient Prescriptions  Medication Sig Dispense Refill  . ACIPHEX 20 MG tablet Take 20-40 mg by mouth 2 (two) times daily. Takes 2 tablet in  am and 1 tablets in pm      . cholecalciferol (VITAMIN D) 1000 UNITS tablet Take 1,000 Units by mouth daily.      Marland Kitchen estradiol (ESTRACE) 1 MG tablet Take 1 mg by mouth daily.       . SUMAtriptan (IMITREX) 25 MG tablet Take 25 mg by mouth every 2 (two) hours as needed for migraine.       . vitamin B-12 (CYANOCOBALAMIN) 1000 MCG tablet Take 1,000 mcg by mouth daily.      . vitamin E 1000 UNIT capsule Take 1,000 Units by mouth daily.        . Calcium Carb-Cholecalciferol (CALCIUM 600 + D PO) Take 1 tablet by mouth 2 (two) times daily.      Marland Kitchen gabapentin (NEURONTIN) 400 MG capsule Take 400 mg by mouth 3 (three) times daily.      . Glucosamine HCl 1500 MG TABS Take 2,000 mg by mouth daily.        No current facility-administered medications for this visit.     Allergies  Allergen Reactions  . Avelox [Moxifloxacin Hcl In Nacl]     C-diff  . Nitrofurantoin Monohyd Macro Hives  . Percocet [Oxycodone-Acetaminophen] Hives  . Pulmicort  Turbuhaler [Budesonide] Other (See Comments)    Blisters in mouth   . Amoxicillin Rash  . Chlorhexidine Rash  . Clindamycin/Lincomycin Rash  . Doxycycline Rash  . Entex Hives and Rash  . Hydrocodone Rash  . Tavist-D [Albertsons Dayhist-D] Rash  . Tizanidine Rash  . Witch Hazel Rash     No family history on file.   History   Social History  . Marital Status: Married    Spouse Name: N/A    Number of Children: N/A  . Years of Education: N/A   Social History Main Topics  . Smoking status: Never Smoker   . Smokeless tobacco: Never Used  . Alcohol Use: No  . Drug Use: No  . Sexual Activity: Yes    Birth Control/ Protection: Surgical   Other Topics Concern  . None   Social History Narrative  . None     REVIEW OF SYSTEMS - PERTINENT POSITIVES ONLY: 12 point review of systems negative other than HPI and PMH except for joint pain  EXAM: Filed Vitals:   01/01/14 1321  BP: 130/80  Pulse: 60  Temp: 97.9 F (36.6 C)  Resp: 14    Wt Readings from Last 3 Encounters:  01/04/14 140 lb (63.504 kg)  01/01/14 142 lb 9.6 oz (64.683 kg)  09/13/13 143 lb (64.864 kg)     Gen:  No acute distress.  Well nourished and well groomed.   Neurological: Alert and oriented to person, place, and time. Coordination normal.  Head: Normocephalic and atraumatic.  Eyes: Conjunctivae are normal. Pupils are equal, round, and reactive to light. No scleral icterus.  Neck: Normal range of motion. Neck supple. No tracheal deviation or thyromegaly present.  Cardiovascular: Normal rate, regular rhythm, normal heart sounds and intact distal pulses.  Exam reveals no gallop and no friction rub.  No murmur heard. Breast: clear discharge elicited with pressure on right breast.  No masses or LAD palpable.  No nipple retraction or skin dimpling.   Respiratory: Effort normal.  No respiratory distress. No chest wall tenderness. Breath sounds normal.  No wheezes, rales or rhonchi.  GI: Soft. Bowel sounds  are normal. The abdomen is soft and nontender.  There is no rebound and no guarding.  Musculoskeletal: Normal range of  motion. Extremities are nontender.  Lymphadenopathy: No cervical, preauricular, postauricular or axillary adenopathy is present Skin: Skin is warm and dry. No rash noted. No diaphoresis. No erythema. No pallor. No clubbing, cyanosis, or edema.   Psychiatric: Normal mood and affect. Behavior is normal. Judgment and thought content normal.    LABORATORY RESULTS: Available labs are reviewed   Recent Results (from the past 2160 hour(s))  CBC     Status: None   Collection Time    01/04/14  9:35 AM      Result Value Ref Range   WBC 4.7  4.0 - 10.5 K/uL   RBC 5.07  3.87 - 5.11 MIL/uL   Hemoglobin 13.3  12.0 - 15.0 g/dL   HCT 40.7  36.0 - 46.0 %   MCV 80.3  78.0 - 100.0 fL   MCH 26.2  26.0 - 34.0 pg   MCHC 32.7  30.0 - 36.0 g/dL   RDW 14.7  11.5 - 15.5 %   Platelets 269  150 - 400 K/uL     RADIOLOGY RESULTS: See E-Chart or I-Site for most recent results.  Images and reports are reviewed.  No results found.    ASSESSMENT AND PLAN: Nipple discharge This appears physiologic in color, but patient appears to have a papilloma and persistent discharge.  Will plan major duct excision.  Discussed surgery with patient.  Will plan outpt surgery.  Reviewed the process, risks, and benefits.    Reviewed post op recovery.       Milus Height MD Surgical Oncology, [facility]      Visit Diagnoses: 1. Nipple discharge     Primary Care Physician: Margarita Rana, MD

## 2014-01-11 NOTE — Progress Notes (Signed)
Notified OR of Dr. Marlowe Aschoff first case receiving Vancomycin.

## 2014-01-11 NOTE — Discharge Instructions (Signed)
[facility] °Office Phone Number [phone] ° °BREAST BIOPSY/ PARTIAL MASTECTOMY: POST OP INSTRUCTIONS ° °Always review your discharge instruction sheet given to you by the facility where your surgery was performed. ° °IF YOU HAVE DISABILITY OR FAMILY LEAVE FORMS, YOU MUST BRING THEM TO THE OFFICE FOR PROCESSING.  DO NOT GIVE THEM TO YOUR DOCTOR. ° °1. A prescription for pain medication may be given to you upon discharge.  Take your pain medication as prescribed, if needed.  If narcotic pain medicine is not needed, then you may take acetaminophen (Tylenol) or ibuprofen (Advil) as needed. °2. Take your usually prescribed medications unless otherwise directed °3. If you need a refill on your pain medication, please contact your pharmacy.  They will contact our office to request authorization.  Prescriptions will not be filled after 5pm or on week-ends. °4. You should eat very light the first 24 hours after surgery, such as soup, crackers, pudding, etc.  Resume your normal diet the day after surgery. °5. Most patients will experience some swelling and bruising in the breast.  Ice packs and a good support bra will help.  Swelling and bruising can take several days to resolve.  °6. It is common to experience some constipation if taking pain medication after surgery.  Increasing fluid intake and taking a stool softener will usually help or prevent this problem from occurring.  A mild laxative (Milk of Magnesia or Miralax) should be taken according to package directions if there are no bowel movements after 48 hours. °7. Unless discharge instructions indicate otherwise, you may remove your bandages 48 hours after surgery, and you may shower at that time.  You may have steri-strips (small skin tapes) in place directly over the incision.  These strips should be left on the skin for 7-10 days.   Any sutures or staples will be removed at the office during your follow-up visit. °8. ACTIVITIES:  You may resume  regular daily activities (gradually increasing) beginning the next day.  Wearing a good support bra or sports bra (or the breast binder) minimizes pain and swelling.  You may have sexual intercourse when it is comfortable. °a. You may drive when you no longer are taking prescription pain medication, you can comfortably wear a seatbelt, and you can safely maneuver your car and apply brakes. °b. RETURN TO WORK:  __________1 week_______________ °9. You should see your doctor in the office for a follow-up appointment approximately two weeks after your surgery.  Your doctor’s nurse will typically make your follow-up appointment when she calls you with your pathology report.  Expect your pathology report 2-3 business days after your surgery.  You may call to check if you do not hear from us after three days. ° ° °WHEN TO CALL YOUR DOCTOR: °1. Fever over 101.0 °2. Nausea and/or vomiting. °3. Extreme swelling or bruising. °4. Continued bleeding from incision. °5. Increased pain, redness, or drainage from the incision. ° °The clinic staff is available to answer your questions during regular business hours.  Please don’t hesitate to call and ask to speak to one of the nurses for clinical concerns.  If you have a medical emergency, go to the nearest emergency room or call 911.  A surgeon from [facility] is always on call at the hospital. ° °For further questions, please visit [email or web address]  ° °

## 2014-01-11 NOTE — Interval H&P Note (Signed)
History and Physical Interval Note:  01/11/2014 7:30 AM  Wendy Patton  has presented today for surgery, with the diagnosis of right nipple discharge  The various methods of treatment have been discussed with the patient and family. After consideration of risks, benefits and other options for treatment, the patient has consented to  Procedure(s): MAJOR DUCT EXCISION RIGHT BREAST (Right) as a surgical intervention .  The patient's history has been reviewed, patient examined, no change in status, stable for surgery.  I have reviewed the patient's chart and labs.  Questions were answered to the patient's satisfaction.     Gustie Bobb

## 2014-01-11 NOTE — Transfer of Care (Signed)
Immediate Anesthesia Transfer of Care Note  Patient: Wendy Patton  Procedure(s) Performed: Procedure(s): MAJOR DUCT EXCISION RIGHT BREAST (Right)  Patient Location: PACU  Anesthesia Type:General  Level of Consciousness: awake, alert , oriented and patient cooperative  Airway & Oxygen Therapy: Patient Spontanous Breathing and Patient connected to face mask oxygen  Post-op Assessment: Report given to PACU RN and Post -op Vital signs reviewed and stable  Post vital signs: Reviewed and stable  Complications: No apparent anesthesia complications

## 2014-01-11 NOTE — Anesthesia Postprocedure Evaluation (Signed)
  Anesthesia Post-op Note  Patient: Wendy Patton  Procedure(s) Performed: Procedure(s) (LRB): MAJOR DUCT EXCISION RIGHT BREAST (Right)  Patient Location: PACU  Anesthesia Type: General  Level of Consciousness: awake and alert   Airway and Oxygen Therapy: Patient Spontanous Breathing  Post-op Pain: mild  Post-op Assessment: Post-op Vital signs reviewed, Patient's Cardiovascular Status Stable, Respiratory Function Stable, Patent Airway and No signs of Nausea or vomiting  Last Vitals:  Filed Vitals:   01/11/14 0945  BP: 127/66  Pulse: 57  Temp: 36.2 C  Resp: 14    Post-op Vital Signs: stable   Complications: No apparent anesthesia complications

## 2014-01-11 NOTE — Op Note (Signed)
PRE-OPERATIVE DIAGNOSIS: right nipple discharge, abnormal ductogram  POST-OPERATIVE DIAGNOSIS:  Same  PROCEDURE:  Procedure(s): Right breast major duct excision  SURGEON:  Surgeon(s): Stark Klein, MD  ANESTHESIA:   local and general  DRAINS: none   LOCAL MEDICATIONS USED:  MARCAINE    and LIDOCAINE   SPECIMEN:  Source of Specimen:  right breast major duct excision  DISPOSITION OF SPECIMEN:  PATHOLOGY  COUNTS:  YES  DICTATION: .Dragon Dictation  PLAN OF CARE: Discharge to home after PACU  PATIENT DISPOSITION:  PACU - hemodynamically stable.  EBL:  Minimal  FINDINGS:  Duct terminated around 3 cm into breast   PROCEDURE:    Patient with probably area and taken to the operating room where she was placed supine on the operating room table. General anesthesia was induced.  Her right breast and chest were prepped and draped in sterile fashion. Technicare prep was used as the patient is allergic to chlorhexidine.  Timeout was performed according to the surgical safety checklist. When all was correct, we continued.  The right nipple was compressed in the location of the discharge was found. The #1 lacrimal duct probe was advanced into the breast easily through this duct. It terminated approximately 3 cm in. A lateral incision was made in the circumareolar incision after administration of local.  The tissue was taken off the back of the nipple with Metzenbaum scissors. The area with the probe was grasped with 2 Allis clamps. This was dissected out with a combination of cautery and sharp dissection. A small margin beyond where the probe truncated was taken. Once this was out the specimen was marked with sutures for orientation. The wound was inspected for hemostasis. The wound was also irrigated and reinspected for hemostasis.   The skin was then reapproximated using 3-0 Vicryl deep dermal and a ruptured sutures and 4-0 Monocryl running subcuticular suture. The bone was then cleaned,  dried, and dressed with Dermabond and soft sterile dressings. The patient was allowed to emerge from anesthesia and was taken to the PACU in stable condition. Needle, sponge, and instrument counts were correct x2.

## 2014-01-14 NOTE — Progress Notes (Signed)
Quick Note:  Please let patient know that pathology is benign. ______ 

## 2014-01-16 ENCOUNTER — Encounter (HOSPITAL_COMMUNITY): Payer: Self-pay | Admitting: General Surgery

## 2014-01-22 ENCOUNTER — Ambulatory Visit (INDEPENDENT_AMBULATORY_CARE_PROVIDER_SITE_OTHER): Payer: 59 | Admitting: General Surgery

## 2014-01-22 ENCOUNTER — Encounter (INDEPENDENT_AMBULATORY_CARE_PROVIDER_SITE_OTHER): Payer: Self-pay | Admitting: General Surgery

## 2014-01-22 VITALS — BP 126/74 | HR 69 | Temp 98.4°F | Ht 69.0 in | Wt 141.0 lb

## 2014-01-22 DIAGNOSIS — N6452 Nipple discharge: Secondary | ICD-10-CM

## 2014-01-22 DIAGNOSIS — N6459 Other signs and symptoms in breast: Secondary | ICD-10-CM

## 2014-01-22 NOTE — Progress Notes (Signed)
HISTORY: Patient is a 78 year old female who is status post major duct excision on the right for recurrent nipple discharge. She has been doing quite well. She states that she took 2 tramadol for pain and otherwise has taken Tylenol or nothing. She denies fevers and chills. She has not had any other health issues. She denies discomfort other than a little swelling.   EXAM: General:  Alert and oriented x3. No acute distress.  Incision:  healing well. No evidence of drainage or erythema. Small seroma.   PATHOLOGY: Diagnosis Breast, excision, right breast - INTRADUCTAL SCLEROSING PAPILLOMA WITH ASSOCIATED CALCIFICATIONS. - DUCT ECTASIA WITH ASSOCIATED CHRONIC INFLAMMATION. - BACKGROUND FIBROCYSTIC CHANGES INCLUDING APOCRINE METAPLASIA, USUAL DUCTAL HYPERPLASIA, DILATED CYST AND STROMAL FIBROSIS. - NO ATYPIA OR MALIGNANCY. - INKED MARGINS, NEGATIVE FOR ATYPIA OR MALIGNANCY.   ASSESSMENT AND PLAN:   Nipple discharge No evidence of surgical complications.   Path was papilloma  Follow up PRN.  Next mammogram is already scheduled.        Milus Height, MD Surgical Oncology, [facility], [place] Mart, MD Margarita Rana, MD

## 2014-01-22 NOTE — Patient Instructions (Signed)
Call for concerns.  Follow up PRN.

## 2014-01-22 NOTE — Assessment & Plan Note (Signed)
No evidence of surgical complications.   Path was papilloma  Follow up PRN.  Next mammogram is already scheduled.

## 2014-02-07 ENCOUNTER — Encounter (INDEPENDENT_AMBULATORY_CARE_PROVIDER_SITE_OTHER): Payer: Self-pay

## 2014-07-02 ENCOUNTER — Ambulatory Visit: Payer: Self-pay | Admitting: Gastroenterology

## 2014-10-11 ENCOUNTER — Ambulatory Visit: Payer: Self-pay | Admitting: Family Medicine

## 2014-10-16 ENCOUNTER — Encounter
Admit: 2014-10-16 | Disposition: A | Discharge status: Home / Self Care | Payer: Self-pay | Attending: Orthopaedic Surgery | Admitting: Orthopaedic Surgery

## 2014-11-02 ENCOUNTER — Encounter
Admit: 2014-11-02 | Disposition: A | Discharge status: Home / Self Care | Payer: Self-pay | Attending: Orthopaedic Surgery | Admitting: Orthopaedic Surgery

## 2015-01-10 ENCOUNTER — Ambulatory Visit (INDEPENDENT_AMBULATORY_CARE_PROVIDER_SITE_OTHER): Payer: Medicare PPO | Admitting: Urgent Care

## 2015-01-10 ENCOUNTER — Encounter: Payer: Self-pay | Admitting: Urgent Care

## 2015-01-10 VITALS — BP 144/82 | HR 66 | Temp 97.9°F | Ht 68.0 in | Wt 132.0 lb

## 2015-01-10 DIAGNOSIS — K219 Gastro-esophageal reflux disease without esophagitis: Secondary | ICD-10-CM | POA: Diagnosis not present

## 2015-01-10 MED ORDER — RABEPRAZOLE SODIUM 20 MG PO TBEC: 20.0000 mg | DELAYED_RELEASE_TABLET | Freq: Every day | ORAL | Status: DC

## 2015-01-10 NOTE — Assessment & Plan Note (Signed)
Well controlled on AcipHex 20 mg daily.  Hoarseness improved significantly. She will call she has any further problems otherwise will follow-up in 2 years.

## 2015-01-10 NOTE — Progress Notes (Signed)
Primary Care Physician: Margarita Rana, MD Primary Gastroenterologist:  Dr Allen Norris  Chief Complaint  Patient presents with  . Follow-up    GERD    HPI: Wendy Patton is a 79 y.o. female here for follow up of GERD.   She taking taking Aciphex 20mg  nightly.  Denies heartburn, indigestion, nausea, vomiting, dysphagia, odynophagia or anorexia.  Hoarseness has been better.  Denies abdominal pain.    Current Outpatient Prescriptions  Medication Sig Dispense Refill  . Calcium Carb-Cholecalciferol (CALCIUM 600 + D PO) Take 1 tablet by mouth 2 (two) times daily.    . cholecalciferol (VITAMIN D) 1000 UNITS tablet Take 1,000 Units by mouth daily.    Marland Kitchen estradiol (ESTRACE) 1 MG tablet Take 1 mg by mouth every other day.     . Glucosamine HCl 1500 MG TABS Take 2,000 mg by mouth daily.     . RABEprazole (ACIPHEX) 20 MG tablet Take 1 tablet (20 mg total) by mouth daily. Takes 2 tablet in am and 1 tablets in pm 31 tablet 11  . SUMAtriptan (IMITREX) 25 MG tablet Take 25 mg by mouth every 2 (two) hours as needed for migraine.     . vitamin B-12 (CYANOCOBALAMIN) 1000 MCG tablet Take 1,000 mcg by mouth daily.    . Vitamin D, Cholecalciferol, 1000 UNITS TABS Take 1 tablet by mouth daily.    . vitamin E 1000 UNIT capsule Take 1,000 Units by mouth daily.       No current facility-administered medications for this visit.    Allergies as of 01/10/2015 - Review Complete 01/10/2015  Allergen Reaction Noted  . Avelox [moxifloxacin hcl in nacl]  08/28/2013  . Neurontin [gabapentin] Diarrhea 01/10/2015  . Nitrofurantoin monohyd macro Hives 07/01/2011  . Percocet [oxycodone-acetaminophen] Hives 07/01/2011  . Pulmicort turbuhaler [budesonide] Other (See Comments) 07/01/2011  . Amoxicillin Rash 07/01/2011  . Chlorhexidine Rash 08/28/2013  . Ciprofloxacin hcl Rash 01/10/2015  . Clindamycin/lincomycin Rash 07/01/2011  . Doxycycline Rash 07/01/2011  . Entex Hives and Rash 07/01/2011  . Hydrocodone Rash  08/28/2013  . Tavist-d [albertsons dayhist-d] Rash 07/01/2011  . Tizanidine Rash 07/01/2011  . Witch hazel Rash 07/01/2011    Review of Systems: Gen: Denies any fever, chills, fatigue, weakness, malaise ENT: Negative for hoarseness, difficulty swallowing , nasal congestion CV: Denies chest pain, angina, palpitations, syncope, orthopnea, PND, peripheral edema, and claudication. Resp: Denies dyspnea at rest, dyspnea with exercise, cough, sputum, wheezing, coughing up blood, and pleurisy. GI: See HPI GU:  Negative for dysuria, hematuria, urinary incontinence, urinary frequency, nocturnal urination.  Endo: Negative for unusual weight change or sweats Derm: Denies jaundice, rash, itching, or unhealing ulcers.  Psych: Denies depression, anxiety, memory loss, suicidal ideation, hallucinations, paranoia, and confusion. Heme: Denies bruising, bleeding, and enlarged lymph nodes.   Physical Examination:  BP 144/82 mmHg  Pulse 66  Temp(Src) 97.9 F (36.6 C) (Oral)  Ht 5\' 8"  (1.727 m)  Wt 132 lb (59.875 kg)  BMI 20.08 kg/m2 Body mass index is 20.08 kg/(m^2). No LMP recorded. Patient has had a hysterectomy. General:   Alert,  Well-developed, well-nourished, pleasant and cooperative in NAD Head:  Normocephalic and atraumatic. Eyes:  Sclera clear, no icterus.   Conjunctiva pink. Mouth:  No deformity or lesions.  Oropharynx pink & moist. Neck:  Supple; no masses or thyromegaly. Heart:  Regular rate and rhythm; no murmurs, clicks, rubs,  or gallops. Abdomen:   Normal bowel sounds.  Soft, nontender and nondistended. No masses, hepatosplenomegaly or hernias noted. No  guarding or rebound tenderness.   Msk:  Symmetrical without gross deformities. Normal posture. Pulses:  Normal pulses noted. Extremities:  Without clubbing or edema. Neurologic:  Alert and  oriented x3;  grossly normal neurologically. Skin:  Intact without significant lesions or rashes. Cervical Nodes:  No significant cervical  adenopathy. Psych:  Alert and cooperative. Normal mood and affect.

## 2015-01-10 NOTE — Patient Instructions (Signed)
Continue AcipHex 20 mg daily Follow-up in 2 years or sooner if needed

## 2015-01-14 ENCOUNTER — Other Ambulatory Visit: Payer: Self-pay

## 2015-01-14 DIAGNOSIS — K219 Gastro-esophageal reflux disease without esophagitis: Secondary | ICD-10-CM

## 2015-01-14 MED ORDER — RABEPRAZOLE SODIUM 20 MG PO TBEC: 20.0000 mg | DELAYED_RELEASE_TABLET | Freq: Two times a day (BID) | ORAL | Status: DC

## 2015-01-15 ENCOUNTER — Other Ambulatory Visit: Payer: Self-pay

## 2015-01-15 DIAGNOSIS — K219 Gastro-esophageal reflux disease without esophagitis: Secondary | ICD-10-CM

## 2015-01-15 MED ORDER — RABEPRAZOLE SODIUM 20 MG PO TBEC: 20.0000 mg | DELAYED_RELEASE_TABLET | Freq: Two times a day (BID) | ORAL | Status: DC

## 2015-01-22 ENCOUNTER — Other Ambulatory Visit: Payer: Self-pay | Admitting: Family Medicine

## 2015-01-22 DIAGNOSIS — Z780 Asymptomatic menopausal state: Secondary | ICD-10-CM

## 2015-01-22 MED ORDER — ESTRADIOL 1 MG PO TABS: 1.0000 mg | ORAL_TABLET | ORAL | Status: DC

## 2015-02-06 ENCOUNTER — Telehealth: Payer: Self-pay

## 2015-02-06 NOTE — Telephone Encounter (Signed)
Left message to call back  

## 2015-02-06 NOTE — Telephone Encounter (Signed)
-----   Message from Carmon Ginsberg, [place] sent at 02/06/2015  1:06 PM EDT ----- Regarding: e mail question Let her know if her urinary sx are from lack of estrogen the phenazopyridine is ok. If there is an actual infection it may worsen/simmer and a urinalysis is appropriate to check it out.

## 2015-02-12 NOTE — Telephone Encounter (Signed)
Patient has been advised

## 2015-02-12 NOTE — Telephone Encounter (Signed)
LMTCB

## 2015-04-27 ENCOUNTER — Other Ambulatory Visit: Payer: Self-pay | Admitting: Family Medicine

## 2015-04-27 DIAGNOSIS — Z1239 Encounter for other screening for malignant neoplasm of breast: Secondary | ICD-10-CM

## 2015-05-01 ENCOUNTER — Encounter: Payer: Self-pay | Admitting: Family Medicine

## 2015-05-01 ENCOUNTER — Ambulatory Visit (INDEPENDENT_AMBULATORY_CARE_PROVIDER_SITE_OTHER): Payer: Medicare PPO | Admitting: Family Medicine

## 2015-05-01 VITALS — BP 92/68 | HR 68 | Temp 97.8°F | Resp 16 | Wt 137.8 lb

## 2015-05-01 DIAGNOSIS — Z8669 Personal history of other diseases of the nervous system and sense organs: Secondary | ICD-10-CM | POA: Diagnosis not present

## 2015-05-01 DIAGNOSIS — Z780 Asymptomatic menopausal state: Secondary | ICD-10-CM

## 2015-05-01 DIAGNOSIS — M940 Chondrocostal junction syndrome [Tietze]: Secondary | ICD-10-CM | POA: Insufficient documentation

## 2015-05-01 DIAGNOSIS — R350 Frequency of micturition: Secondary | ICD-10-CM | POA: Diagnosis not present

## 2015-05-01 DIAGNOSIS — I313 Pericardial effusion (noninflammatory): Secondary | ICD-10-CM | POA: Insufficient documentation

## 2015-05-01 DIAGNOSIS — K219 Gastro-esophageal reflux disease without esophagitis: Secondary | ICD-10-CM | POA: Insufficient documentation

## 2015-05-01 DIAGNOSIS — N952 Postmenopausal atrophic vaginitis: Secondary | ICD-10-CM

## 2015-05-01 DIAGNOSIS — M170 Bilateral primary osteoarthritis of knee: Secondary | ICD-10-CM | POA: Insufficient documentation

## 2015-05-01 DIAGNOSIS — Z1239 Encounter for other screening for malignant neoplasm of breast: Secondary | ICD-10-CM | POA: Diagnosis not present

## 2015-05-01 DIAGNOSIS — K589 Irritable bowel syndrome without diarrhea: Secondary | ICD-10-CM | POA: Insufficient documentation

## 2015-05-01 DIAGNOSIS — G629 Polyneuropathy, unspecified: Secondary | ICD-10-CM | POA: Insufficient documentation

## 2015-05-01 DIAGNOSIS — K529 Noninfective gastroenteritis and colitis, unspecified: Secondary | ICD-10-CM | POA: Insufficient documentation

## 2015-05-01 DIAGNOSIS — E7800 Pure hypercholesterolemia, unspecified: Secondary | ICD-10-CM | POA: Insufficient documentation

## 2015-05-01 DIAGNOSIS — IMO0002 Reserved for concepts with insufficient information to code with codable children: Secondary | ICD-10-CM | POA: Insufficient documentation

## 2015-05-01 DIAGNOSIS — I3139 Other pericardial effusion (noninflammatory): Secondary | ICD-10-CM | POA: Insufficient documentation

## 2015-05-01 DIAGNOSIS — N289 Disorder of kidney and ureter, unspecified: Secondary | ICD-10-CM | POA: Insufficient documentation

## 2015-05-01 LAB — POCT URINALYSIS DIPSTICK
BILIRUBIN UA: NEGATIVE
Glucose, UA: NEGATIVE
KETONES UA: NEGATIVE
Leukocytes, UA: NEGATIVE
Nitrite, UA: NEGATIVE
PH UA: 6
PROTEIN UA: NEGATIVE
RBC UA: NEGATIVE
SPEC GRAV UA: 1.01
Urobilinogen, UA: 0.2

## 2015-05-01 MED ORDER — ESTRADIOL 1 MG PO TABS: 1.0000 mg | ORAL_TABLET | Freq: Every day | ORAL | Status: DC

## 2015-05-01 NOTE — Patient Instructions (Signed)
We can consider a medication for overactive bladder if your symptoms do not improve with increased frequency of estradiol.

## 2015-05-01 NOTE — Progress Notes (Signed)
Subjective:     Patient ID: Wendy Patton, female   DOB: 20-Oct-1935, 79 y.o.   MRN: [id number]  HPI  Chief Complaint  Patient presents with  . Urinary Frequency    Patient comes in office with concerns of urinary frequency sine May. Patient states that she has noticed spasms of her urethra , frequency of urination and urgency.  States this has been ongoing for about 6 months. Reports since we decreased estradiol to every other day this has recurred. Has had prior urological evaluation and estrogen was recommended.   Review of Systems  Constitutional: Negative for fever and chills.  Genitourinary:       Denies urinary burning. Reports nocturia ever 2 hours.       Objective:   Physical Exam  Constitutional: She appears well-developed and well-nourished. No distress.       Assessment:    1. Urinary frequency - POCT urinalysis dipstick - estradiol (ESTRACE) 1 MG tablet; Take 1 tablet (1 mg total) by mouth daily.  Dispense: 90 tablet; Refill: 3  2. Atrophic vaginitis - estradiol (ESTRACE) 1 MG tablet; Take 1 tablet (1 mg total) by mouth daily.  Dispense: 90 tablet; Refill: 3  3. Breast cancer screening - MM DIGITAL SCREENING BILATERAL; Future  4. Menopause - estradiol (ESTRACE) 1 MG tablet; Take 1 tablet (1 mg total) by mouth daily.  Dispense: 90 tablet; Refill: 3    Plan:    Consider antispasmodic if not improved with daily dose of estradiol.

## 2015-06-12 ENCOUNTER — Other Ambulatory Visit: Payer: Self-pay | Admitting: *Deleted

## 2015-06-12 ENCOUNTER — Inpatient Hospital Stay
Admission: RE | Admit: 2015-06-12 | Discharge: 2015-06-12 | Disposition: A | Discharge status: Home / Self Care | Payer: Self-pay | Source: Ambulatory Visit | Attending: *Deleted | Admitting: *Deleted

## 2015-06-12 DIAGNOSIS — Z9289 Personal history of other medical treatment: Secondary | ICD-10-CM

## 2015-06-19 ENCOUNTER — Ambulatory Visit: Payer: Medicare PPO | Attending: Family Medicine

## 2015-07-04 ENCOUNTER — Ambulatory Visit
Admission: RE | Admit: 2015-07-04 | Discharge: 2015-07-04 | Disposition: A | Discharge status: Home / Self Care | Payer: Medicare PPO | Source: Ambulatory Visit | Attending: Family Medicine | Admitting: Family Medicine

## 2015-07-04 ENCOUNTER — Other Ambulatory Visit: Payer: Self-pay | Admitting: Family Medicine

## 2015-07-04 DIAGNOSIS — Z1239 Encounter for other screening for malignant neoplasm of breast: Secondary | ICD-10-CM

## 2015-07-04 DIAGNOSIS — Z1231 Encounter for screening mammogram for malignant neoplasm of breast: Secondary | ICD-10-CM | POA: Diagnosis present

## 2015-08-28 ENCOUNTER — Encounter: Payer: Self-pay | Admitting: Family Medicine

## 2015-11-07 ENCOUNTER — Encounter: Payer: Self-pay | Admitting: Family Medicine

## 2015-11-07 ENCOUNTER — Ambulatory Visit (INDEPENDENT_AMBULATORY_CARE_PROVIDER_SITE_OTHER): Payer: Medicare Other | Admitting: Family Medicine

## 2015-11-07 VITALS — BP 118/62 | HR 78 | Temp 98.2°F | Resp 16 | Wt 140.4 lb

## 2015-11-07 DIAGNOSIS — J0110 Acute frontal sinusitis, unspecified: Secondary | ICD-10-CM

## 2015-11-07 MED ORDER — CEFDINIR 300 MG PO CAPS: 300.0000 mg | ORAL_CAPSULE | Freq: Two times a day (BID) | ORAL | Status: DC

## 2015-11-07 NOTE — Patient Instructions (Signed)
Continue decongestants and expectorant like Mucinex.

## 2015-11-07 NOTE — Progress Notes (Signed)
Subjective:     Patient ID: Wendy Patton, female   DOB: [birth year], 80 y.o.   MRN: [id number]  HPI  Chief Complaint  Patient presents with  . Sinus Problem    Patient comes in office todya with concerns of sinus pain and pressure for the past 9 days. Associated symptoms include: fever yesterday high 100.6, congestion, headache, productive cough with yellow sputum and decreased hearing. Patient has tried taking otc: Aleve, Mucous relief, Benadryl, cough syrup, and nettie pot  Patient reports increased sinus pressure behind her eyes, purulent sinus drainage, post nasal drainage and accompanying cough. Accompanied by her husband today.   Review of Systems     Objective:   Physical Exam  Constitutional: She appears well-developed and well-nourished. No distress.  Ears: T.M's intact without inflammation Sinuses: mild frontal sinus tenderness Throat: no tonsillar enlargement or exudate Neck: no cervical adenopathy Lungs: clear     Assessment:    1. Acute frontal sinusitis, recurrence not specified - cefdinir (OMNICEF) 300 MG capsule; Take 1 capsule (300 mg total) by mouth 2 (two) times daily.  Dispense: 20 capsule; Refill: 0    Plan:    Continue decongestants and expectorants. Monitor for rash and continue Benadryl.

## 2015-11-26 ENCOUNTER — Encounter: Payer: Self-pay | Admitting: Family Medicine

## 2015-11-26 ENCOUNTER — Ambulatory Visit (INDEPENDENT_AMBULATORY_CARE_PROVIDER_SITE_OTHER): Payer: Medicare Other | Admitting: Family Medicine

## 2015-11-26 VITALS — BP 112/66 | HR 68 | Temp 97.9°F | Resp 16 | Wt 139.6 lb

## 2015-11-26 DIAGNOSIS — H6983 Other specified disorders of Eustachian tube, bilateral: Secondary | ICD-10-CM | POA: Diagnosis not present

## 2015-11-26 DIAGNOSIS — R05 Cough: Secondary | ICD-10-CM

## 2015-11-26 DIAGNOSIS — R0981 Nasal congestion: Secondary | ICD-10-CM

## 2015-11-26 DIAGNOSIS — R059 Cough, unspecified: Secondary | ICD-10-CM

## 2015-11-26 MED ORDER — BENZONATATE 100 MG PO CAPS: 100.0000 mg | ORAL_CAPSULE | Freq: Three times a day (TID) | ORAL | Status: DC | PRN

## 2015-11-26 MED ORDER — FLUTICASONE PROPIONATE 50 MCG/ACT NA SUSP: 2.0000 | Freq: Every day | NASAL | Status: DC

## 2015-11-26 NOTE — Patient Instructions (Signed)
If not improving call for ENT referral.

## 2015-11-26 NOTE — Progress Notes (Signed)
Subjective:     Patient ID: Wendy Patton, female   DOB: [birth year], 80 y.o.   MRN: [id number]  HPI  Chief Complaint  Patient presents with  . Sinusitis    Patient returns to office for follow up, patient was last seen on 11/07/15 and prescribed Omnicef 300mg . Patient reports that she no longer has fever but states that she has cough, headache, fatigue and congestion. Patient has been using her nettie pot for relief. Patient reports that on 4/19 she broke out in hives on her upper torso and head, she states that she was finished with antibiotic by the time he developed hives.   Reports she continues to have frontal sinus pressure associated with PND and cough at night: "My ears are gurgling." Drainage is clear and Neti pot irrigations are clear as well. No fever. States she tolerated cefdinir and believes the hives were unrelated as resolved easily with Benadryl. Reports she does not have environmental allergies confirmed by prior testing.   Review of Systems     Objective:   Physical Exam  Constitutional: She appears well-developed and well-nourished. No distress.  Ears: T.M's intact without inflammation Sinuses: mild frontal sinus tenderness Throat: not examined Neck: no cervical adenopathy Lungs: clear     Assessment:    1. Eustachian tube dysfunction, bilatera - fluticasone (FLONASE) 50 MCG/ACT nasal spray; Place 2 sprays into both nostrils daily.  Dispense: 16 g; Refill: 1  2. Sinus congestio - fluticasone (FLONASE) 50 MCG/ACT nasal spray; Place 2 sprays into both nostrils daily.  Dispense: 16 g; Refill: 1  3. Cough - benzonatate (TESSALON) 100 MG capsule; Take 1 capsule (100 mg total) by mouth 3 (three) times daily as needed for cough.  Dispense: 21 capsule; Refill: 0    Plan:    Will refer to ENT if not improving

## 2015-11-29 ENCOUNTER — Other Ambulatory Visit: Payer: Self-pay | Admitting: Family Medicine

## 2015-11-29 DIAGNOSIS — Z8669 Personal history of other diseases of the nervous system and sense organs: Secondary | ICD-10-CM

## 2015-11-29 MED ORDER — SUMATRIPTAN SUCCINATE 25 MG PO TABS: 25.0000 mg | ORAL_TABLET | ORAL | Status: DC | PRN

## 2016-01-08 ENCOUNTER — Encounter: Payer: Self-pay | Admitting: Family Medicine

## 2016-01-08 ENCOUNTER — Ambulatory Visit (INDEPENDENT_AMBULATORY_CARE_PROVIDER_SITE_OTHER): Payer: Medicare Other | Admitting: Family Medicine

## 2016-01-08 VITALS — BP 110/68 | HR 68 | Temp 98.0°F | Resp 16 | Wt 139.0 lb

## 2016-01-08 DIAGNOSIS — R21 Rash and other nonspecific skin eruption: Secondary | ICD-10-CM | POA: Diagnosis not present

## 2016-01-08 MED ORDER — HYDROXYZINE HCL 25 MG PO TABS: 25.0000 mg | ORAL_TABLET | Freq: Three times a day (TID) | ORAL | Status: DC | PRN

## 2016-01-08 NOTE — Patient Instructions (Addendum)
Discussed use of Claritin one daily and Zantac 150 mg. Daily. If not using hydroxyzine, continue with oral Benadryl.

## 2016-01-08 NOTE — Progress Notes (Signed)
Subjective:     Patient ID: ESVEIDY BON, female   DOB: 1936/01/17, 80 y.o.   MRN: [id number]  HPI  Chief Complaint  Patient presents with  . Urticaria    Patient comes in office today with concerns of hives. Patient states that on Sunday 6/4 she broke out in hives on both arms, she describes skin as itchy and red. Patient reports that she has been using a new facial product but has not been applying to her arms. Patient has ebeen using otc Hydrocortisone cream and Benadryl with no relief.   States she first saw a red bump then the intensely itchy rash became more confluent (right greater than left) affecting both hands and up to her mid right upper arm. Has been using topical hydrocortisone and oral Benadryl with mild improvement. States she has pulled up weeds but has not been exposed to fertilizers or sprays. Hx of multiple drug sensitivities. Reports she has a follow up with her dermatologist tomorrow for a basal cell carcinoma.    Review of Systems  Respiratory: Negative for shortness of breath and wheezing.        Objective:   Physical Exam  Constitutional: She appears well-developed and well-nourished. No distress.  Pulmonary/Chest: Breath sounds normal.  Skin:  Confluent rash on hands. Right arm with confluent rash to her mid upper arm. Underlying white appearing papular lesions on her right distal forearm patient states have been chronic. Left arm with small area of confluent rash on her forearm.       Assessment:    1. Rash due to allergy: Does not appear consistent with contact dermatitis or cellulitis. - hydrOXYzine (ATARAX/VISTARIL) 25 MG tablet; Take 1 tablet (25 mg total) by mouth 3 (three) times daily as needed (as needed for itching/rash).  Dispense: 21 tablet; Refill: 0    Plan:    Add Claritin and Zantac 150 mg. Along with either Benadryl or hydroxyzine.

## 2016-01-09 ENCOUNTER — Ambulatory Visit: Payer: Medicare Other | Admitting: Family Medicine

## 2016-01-13 ENCOUNTER — Ambulatory Visit (INDEPENDENT_AMBULATORY_CARE_PROVIDER_SITE_OTHER): Payer: Medicare Other | Admitting: Family Medicine

## 2016-01-13 VITALS — BP 126/80 | HR 76 | Temp 97.6°F | Resp 16 | Ht 68.0 in | Wt 139.4 lb

## 2016-01-13 DIAGNOSIS — K219 Gastro-esophageal reflux disease without esophagitis: Secondary | ICD-10-CM | POA: Diagnosis not present

## 2016-01-13 DIAGNOSIS — Z8669 Personal history of other diseases of the nervous system and sense organs: Secondary | ICD-10-CM

## 2016-01-13 DIAGNOSIS — Z0000 Encounter for general adult medical examination without abnormal findings: Secondary | ICD-10-CM | POA: Diagnosis not present

## 2016-01-13 DIAGNOSIS — M170 Bilateral primary osteoarthritis of knee: Secondary | ICD-10-CM

## 2016-01-13 DIAGNOSIS — E7800 Pure hypercholesterolemia, unspecified: Secondary | ICD-10-CM | POA: Diagnosis not present

## 2016-01-13 DIAGNOSIS — Z23 Encounter for immunization: Secondary | ICD-10-CM | POA: Diagnosis not present

## 2016-01-13 DIAGNOSIS — I341 Nonrheumatic mitral (valve) prolapse: Secondary | ICD-10-CM | POA: Diagnosis not present

## 2016-01-13 MED ORDER — RABEPRAZOLE SODIUM 20 MG PO TBEC: 20.0000 mg | DELAYED_RELEASE_TABLET | Freq: Every day | ORAL | Status: DC

## 2016-01-13 NOTE — Progress Notes (Addendum)
Subjective:     Patient ID: Wendy Patton, female   DOB: November 30, 1935, 80 y.o.   MRN: [id number]  HPI  Chief Complaint  Patient presents with  . Medicare Wellness  States allergic skin reaction subsided with treatment provided at office visit last week. She saw her dermatologist last week, Dr. Darrick Huntsman and had a basal cell cancer removed below her left breast.   Review of Systems General: Feeling well, Last Tdap in 2016. HEENT: regular dental visits ([place]), and eye exams [place]. Cardiovascular: no chest pain, shortness of breath (except going up stairs carrying a load) or palpitations GI: no heartburn controlled with medication. Does not wish to f/u with Dr. Allen Norris and wishes [place] to rx medication. No change in bowel habits or blood in the stool GU:  no change in bladder habits, estrogen replacement has resolved her frequent dysuria/cystitis. Normal mammogram 07/2015. Neuro: no change in memory, defers cognitive screen Psychiatric: not depressed Musculoskeletal: States prior left hip arthroplasty and significant degenerative changes in her knees contributes to tripping and falls. Followed by orthopedic, Dr. Novella Olive. Does not use or wish an assistive device. Normal bone density 2016.    Objective:   Physical Exam  Constitutional: She appears well-developed and well-nourished. No distress.  Eyes: pupils with diminished reactivity to light (hx of cataracts),, EOMI Neck: no thyromegaly, tenderness or nodules, no cervical adenopathy, no carotid burits ENT: TM's intact without inflammation; No tonsillar enlargement or exudate, Lungs: Clear Heart : RRR with 2/6  systolic murmur. Abd: bowel sounds present, soft, non-tender, no organomegaly Extremities: no edema,      Assessment:    1. Annual physical exam - Comprehensive metabolic panel  2. Hypercholesteremia - Lipid panel  3. Need for tetanus booster - Td : Tetanus/diphtheria >7yo Preservative  free  4. Gastroesophageal  reflux disease, esophagitis presence not specified - RABEprazole (ACIPHEX) 20 MG tablet; Take 1 tablet (20 mg total) by mouth daily. 1 tablet in pm  Dispense: 90 tablet; Refill: 3  5. Mitral valve prolapse: monitor for further sx  6. History of migraine headaches: quiescent  7. Primary osteoarthritis of both knees: per orthopedics    Plan:    Further f/u pending lab work.

## 2016-01-13 NOTE — Patient Instructions (Addendum)
We will call you with the lab results. Mammogram in December.

## 2016-01-16 LAB — COMPREHENSIVE METABOLIC PANEL
A/G RATIO: 1.5 (ref 1.2–2.2)
ALBUMIN: 4.1 g/dL (ref 3.5–4.8)
ALT: 11 IU/L (ref 0–32)
AST: 18 IU/L (ref 0–40)
Alkaline Phosphatase: 52 IU/L (ref 39–117)
BUN / CREAT RATIO: 17 (ref 12–28)
BUN: 16 mg/dL (ref 8–27)
Bilirubin Total: 0.3 mg/dL (ref 0.0–1.2)
CALCIUM: 9.6 mg/dL (ref 8.7–10.3)
CO2: 24 mmol/L (ref 18–29)
Chloride: 97 mmol/L (ref 96–106)
Creatinine, Ser: 0.92 mg/dL (ref 0.57–1.00)
GFR, EST AFRICAN AMERICAN: 69 mL/min/{1.73_m2} (ref >59)
GFR, EST NON AFRICAN AMERICAN: 60 mL/min/{1.73_m2} (ref >59)
GLUCOSE: 80 mg/dL (ref 65–99)
Globulin, Total: 2.8 g/dL (ref 1.5–4.5)
Potassium: 4.6 mmol/L (ref 3.5–5.2)
Sodium: 137 mmol/L (ref 134–144)
TOTAL PROTEIN: 6.9 g/dL (ref 6.0–8.5)

## 2016-01-16 LAB — LIPID PANEL
CHOL/HDL RATIO: 2.8 ratio (ref 0.0–4.4)
Cholesterol, Total: 234 mg/dL — ABNORMAL HIGH (ref 100–199)
HDL: 84 mg/dL (ref >39)
LDL CALC: 135 mg/dL — AB (ref 0–99)
Triglycerides: 73 mg/dL (ref 0–149)
VLDL CHOLESTEROL CAL: 15 mg/dL (ref 5–40)

## 2016-01-27 ENCOUNTER — Ambulatory Visit (INDEPENDENT_AMBULATORY_CARE_PROVIDER_SITE_OTHER): Payer: Medicare Other | Admitting: Family Medicine

## 2016-01-27 ENCOUNTER — Encounter: Payer: Self-pay | Admitting: Family Medicine

## 2016-01-27 VITALS — BP 138/66 | HR 60 | Temp 98.0°F | Resp 16 | Wt 138.0 lb

## 2016-01-27 DIAGNOSIS — H6091 Unspecified otitis externa, right ear: Secondary | ICD-10-CM | POA: Diagnosis not present

## 2016-01-27 MED ORDER — HYDROCORTISONE-ACETIC ACID 1-2 % OT SOLN: 4.0000 [drp] | Freq: Four times a day (QID) | OTIC | Status: DC

## 2016-01-27 NOTE — Progress Notes (Signed)
Subjective:     Patient ID: Wendy Patton, female   DOB: April 28, 1936, 80 y.o.   MRN: [id number]  HPI  Chief Complaint  Patient presents with  . Ear Fullness    right ear   States that 6/21 she noticed she could not hear well out of her right ear. Has used ear wax removal, alcohol, and hydrogen peroxide. Noticed a little bloody drainage after she used the hydrogen peroxide. Today her ear feels normal with no further drainage. Reports due to hx of TMJ surgery she was concerned about implant rejection.   Review of Systems     Objective:   Physical Exam  Constitutional: She appears well-developed and well-nourished. No distress.  HENT:  Left Ear: Tympanic membrane and ear canal normal.  Right ear canal base appears inflamed with small amount of cerumen noted. No tragal tenderness       Assessment:    1. External otitis of right ear - acetic acid-hydrocortisone (VOSOL-HC) otic solution; Place 4 drops into the right ear 4 (four) times daily.  Dispense: 10 mL; Refill: 0    Plan:    Call if she wishes ENT referral.

## 2016-01-27 NOTE — Patient Instructions (Signed)
Let me know if you wish referral to ENT.

## 2016-02-17 ENCOUNTER — Other Ambulatory Visit: Payer: Self-pay | Admitting: Family Medicine

## 2016-02-17 DIAGNOSIS — Z780 Asymptomatic menopausal state: Secondary | ICD-10-CM

## 2016-02-17 DIAGNOSIS — N952 Postmenopausal atrophic vaginitis: Secondary | ICD-10-CM

## 2016-02-17 DIAGNOSIS — R350 Frequency of micturition: Secondary | ICD-10-CM

## 2016-02-17 MED ORDER — ESTRADIOL 1 MG PO TABS: 1.0000 mg | ORAL_TABLET | Freq: Every day | ORAL | Status: DC

## 2016-05-01 ENCOUNTER — Encounter: Payer: Self-pay | Admitting: Family Medicine

## 2016-05-01 ENCOUNTER — Ambulatory Visit (INDEPENDENT_AMBULATORY_CARE_PROVIDER_SITE_OTHER): Payer: Medicare Other | Admitting: Family Medicine

## 2016-05-01 VITALS — BP 122/52 | HR 77 | Temp 97.9°F | Resp 16 | Wt 141.4 lb

## 2016-05-01 DIAGNOSIS — R609 Edema, unspecified: Secondary | ICD-10-CM

## 2016-05-01 NOTE — Patient Instructions (Signed)
Will come in with her bp machine and compare blood pressures. Trial off Voltaren gel and see if swelling improves.

## 2016-05-01 NOTE — Progress Notes (Signed)
Subjective:     Patient ID: Wendy Patton, female   DOB: 12/29/1935, 80 y.o.   MRN: [id number]  HPI  Chief Complaint  Patient presents with  . Leg Swelling    Patient comes in office today with concerns of swelling to her left leg for the past two months, patient states that swelling is mostly in PM only. Patient reports that she has noticed an increase in her blood pressure, with recent reading at home 174/80  States she has been using Voltaren Gel on her knees. Reports swelling goes down at night but notices it mildly on the right as well.   Review of Systems     Objective:   Physical Exam  Constitutional: She appears well-developed and well-nourished. No distress.  Cardiovascular:  Pulses:      Dorsalis pedis pulses are 2+ on the right side, and 2+ on the left side.       Posterior tibial pulses are 2+ on the right side, and 2+ on the left side.  Musculoskeletal: She exhibits no edema (of lower extremties).       Assessment:    1. Edema, unspecified type     Plan:    Trial off Voltaren and office bp check with home machine. Consider renal profile if not improving.

## 2016-05-25 ENCOUNTER — Other Ambulatory Visit: Payer: Self-pay | Admitting: Family Medicine

## 2016-05-25 DIAGNOSIS — Z1231 Encounter for screening mammogram for malignant neoplasm of breast: Secondary | ICD-10-CM

## 2016-07-01 ENCOUNTER — Telehealth: Payer: Self-pay | Admitting: Family Medicine

## 2016-07-01 ENCOUNTER — Other Ambulatory Visit: Payer: Self-pay | Admitting: Family Medicine

## 2016-07-01 NOTE — Telephone Encounter (Signed)
Discussed taking 1/2 of Estradiol 1 mg. With phone f/u in 4 weeks.

## 2016-07-01 NOTE — Telephone Encounter (Signed)
Pt stated she was returning Bob's call. Thanks TNP

## 2016-07-06 ENCOUNTER — Ambulatory Visit
Admission: RE | Admit: 2016-07-06 | Discharge: 2016-07-06 | Disposition: A | Discharge status: Home / Self Care | Payer: Medicare Other | Source: Ambulatory Visit | Attending: Family Medicine | Admitting: Family Medicine

## 2016-07-06 DIAGNOSIS — Z1231 Encounter for screening mammogram for malignant neoplasm of breast: Secondary | ICD-10-CM | POA: Insufficient documentation

## 2016-07-09 ENCOUNTER — Encounter: Payer: Self-pay | Admitting: *Deleted

## 2016-07-13 NOTE — H&P (Signed)
See scanned note.

## 2016-07-15 ENCOUNTER — Ambulatory Visit: Payer: Medicare Other | Admitting: Certified Registered Nurse Anesthetist

## 2016-07-15 ENCOUNTER — Ambulatory Visit
Admission: RE | Admit: 2016-07-15 | Discharge: 2016-07-15 | Disposition: A | Discharge status: Home / Self Care | Payer: Medicare Other | Source: Ambulatory Visit | Attending: Ophthalmology | Admitting: Ophthalmology

## 2016-07-15 ENCOUNTER — Encounter
Admission: RE | Disposition: A | Discharge status: Home / Self Care | Payer: Self-pay | Source: Ambulatory Visit | Attending: Ophthalmology

## 2016-07-15 ENCOUNTER — Encounter: Payer: Self-pay | Admitting: *Deleted

## 2016-07-15 DIAGNOSIS — H2511 Age-related nuclear cataract, right eye: Secondary | ICD-10-CM | POA: Diagnosis present

## 2016-07-15 DIAGNOSIS — K219 Gastro-esophageal reflux disease without esophagitis: Secondary | ICD-10-CM | POA: Insufficient documentation

## 2016-07-15 DIAGNOSIS — J449 Chronic obstructive pulmonary disease, unspecified: Secondary | ICD-10-CM | POA: Insufficient documentation

## 2016-07-15 DIAGNOSIS — M1990 Unspecified osteoarthritis, unspecified site: Secondary | ICD-10-CM | POA: Diagnosis not present

## 2016-07-15 DIAGNOSIS — D649 Anemia, unspecified: Secondary | ICD-10-CM | POA: Diagnosis not present

## 2016-07-15 DIAGNOSIS — Z79899 Other long term (current) drug therapy: Secondary | ICD-10-CM | POA: Insufficient documentation

## 2016-07-15 DIAGNOSIS — R51 Headache: Secondary | ICD-10-CM | POA: Insufficient documentation

## 2016-07-15 DIAGNOSIS — G709 Myoneural disorder, unspecified: Secondary | ICD-10-CM | POA: Diagnosis not present

## 2016-07-15 HISTORY — PX: CATARACT EXTRACTION W/PHACO: SHX586

## 2016-07-15 HISTORY — DX: Nonrheumatic mitral (valve) prolapse: I34.1

## 2016-07-15 SURGERY — PHACOEMULSIFICATION, CATARACT, WITH IOL INSERTION
Anesthesia: Monitor Anesthesia Care | Site: Eye | Laterality: Right | Wound class: Clean

## 2016-07-15 MED ORDER — NA CHONDROIT SULF-NA HYALURON 40-17 MG/ML IO SOLN
INTRAOCULAR | Status: DC | PRN
  Administered 2016-07-15: 1 mL via INTRAOCULAR

## 2016-07-15 MED ORDER — MOXIFLOXACIN HCL 0.5 % OP SOLN
OPHTHALMIC | Status: AC
  Filled 2016-07-15: qty 3

## 2016-07-15 MED ORDER — CYCLOPENTOLATE HCL 2 % OP SOLN
OPHTHALMIC | Status: AC
  Filled 2016-07-15: qty 2

## 2016-07-15 MED ORDER — PHENYLEPHRINE HCL 10 % OP SOLN
OPHTHALMIC | Status: AC
  Filled 2016-07-15: qty 5

## 2016-07-15 MED ORDER — MIDAZOLAM HCL 2 MG/2ML IJ SOLN
INTRAMUSCULAR | Status: DC | PRN
  Administered 2016-07-15 (×2): 0.5 mg via INTRAVENOUS

## 2016-07-15 MED ORDER — SODIUM CHLORIDE 0.9 % IV SOLN
INTRAVENOUS | Status: DC
Start: 2016-07-15 — End: 2016-07-15
  Administered 2016-07-15: 10:00:00 via INTRAVENOUS

## 2016-07-15 MED ORDER — ALFENTANIL 500 MCG/ML IJ INJ
INJECTION | INTRAMUSCULAR | Status: DC | PRN
  Administered 2016-07-15: 350 ug via INTRAVENOUS

## 2016-07-15 MED ORDER — MOXIFLOXACIN HCL 0.5 % OP SOLN
1.0000 [drp] | OPHTHALMIC | Status: AC
  Administered 2016-07-15 (×3): 1 [drp] via OPHTHALMIC

## 2016-07-15 MED ORDER — POVIDONE-IODINE 5 % OP SOLN
OPHTHALMIC | Status: DC | PRN
  Administered 2016-07-15: 1 via OPHTHALMIC

## 2016-07-15 MED ORDER — EPINEPHRINE PF 1 MG/ML IJ SOLN
INTRAOCULAR | Status: DC | PRN
  Administered 2016-07-15: 200 mL via OPHTHALMIC

## 2016-07-15 MED ORDER — LIDOCAINE HCL (PF) 4 % IJ SOLN
INTRAMUSCULAR | Status: DC | PRN
  Administered 2016-07-15: 4 mL via OPHTHALMIC

## 2016-07-15 MED ORDER — PHENYLEPHRINE HCL 10 % OP SOLN
1.0000 [drp] | OPHTHALMIC | Status: AC
  Administered 2016-07-15 (×4): 1 [drp] via OPHTHALMIC

## 2016-07-15 MED ORDER — MOXIFLOXACIN HCL 0.5 % OP SOLN
OPHTHALMIC | Status: DC | PRN
  Administered 2016-07-15: 2 [drp] via OPHTHALMIC

## 2016-07-15 MED ORDER — LIDOCAINE HCL (PF) 4 % IJ SOLN
INTRAOCULAR | Status: DC | PRN
  Administered 2016-07-15: 4 mL via OPHTHALMIC

## 2016-07-15 MED ORDER — CARBACHOL 0.01 % IO SOLN
INTRAOCULAR | Status: DC | PRN
  Administered 2016-07-15: 0.5 mL via INTRAOCULAR

## 2016-07-15 MED ORDER — SCOPOLAMINE 1 MG/3DAYS TD PT72
1.0000 | MEDICATED_PATCH | Freq: Once | TRANSDERMAL | Status: DC
  Administered 2016-07-15: 1.5 mg via TRANSDERMAL

## 2016-07-15 MED ORDER — SCOPOLAMINE 1 MG/3DAYS TD PT72
MEDICATED_PATCH | TRANSDERMAL | Status: AC
  Filled 2016-07-15: qty 1

## 2016-07-15 MED ORDER — CYCLOPENTOLATE HCL 2 % OP SOLN
1.0000 [drp] | OPHTHALMIC | Status: DC
  Administered 2016-07-15 (×4): 1 [drp] via OPHTHALMIC

## 2016-07-15 MED ORDER — TETRACAINE HCL 0.5 % OP SOLN
OPHTHALMIC | Status: DC | PRN
  Administered 2016-07-15: 2 [drp] via OPHTHALMIC

## 2016-07-15 SURGICAL SUPPLY — 29 items

## 2016-07-15 NOTE — Op Note (Signed)
Date of Surgery: 07/15/2016 Date of Dictation: 07/15/2016 10:45 AM Pre-operative Diagnosis:  Nuclear Sclerotic Cataract right Eye Post-operative Diagnosis: same Procedure performed: Extra-capsular Cataract Extraction (ECCE) with placement of a posterior chamber intraocular lens (IOL) right Eye IOL:  Implant Name Type Inv. Item Serial No. Manufacturer Lot No. LRB No. Used  LENS IOL ACRYSOF IQ 21.0 - [id number] Intraocular Lens LENS IOL ACRYSOF IQ 21.0 [id number] ALCON   Right 1   Anesthesia: 2% Lidocaine and 4% Marcaine in a 50/50 mixture with 10 unites/ml of Hylenex given as a peribulbar Anesthesiologist: Anesthesiologist: Molli Barrows, MD CRNA: Demetrius Charity, CRNA Complications: none Estimated Blood Loss: less than 1 ml  Description of procedure:  The patient was given anesthesia and sedation via intravenous access. The patient was then prepped and draped in the usual fashion. A 25-gauge needle was bent for initiating the capsulorhexis. A 5-0 silk suture was placed through the conjunctiva superior and inferiorly to serve as bridle sutures. Hemostasis was obtained at the superior limbus using an eraser cautery. A partial thickness groove was made at the anterior surgical limbus with a 64 Beaver blade and this was dissected anteriorly with an [employer]aya. The anterior chamber was entered at 10 o'clock with a 1.0 mm paracentesis knife and through the lamellar dissection with a 2.6 mm Alcon keratome. Epi-Shugarcaine 0.5 CC [9 cc BSS Plus (Alcon), 3 cc 4% preservative-free lidocaine (Hospira) and 4 cc 1:1000 preservative-free, bisulfite-free epinephrine] was injected into the anterior chamber via the paracentesis tract. Epi-Shugarcaine 0.5 CC [9 cc BSS Plus (Alcon), 3 cc 4% preservative-free lidocaine (Hospira) and 4 cc 1:1000 preservative-free, bisulfite-free epinephrine] was injected into the anterior chamber via the paracentesis tract. DiscoVisc was injected to replace the aqueous  and a continuous tear curvilinear capsulorhexis was performed using a bent 25-gauge needle.  Balance salt on a syringe was used to perform hydro-dissection and phacoemulsification was carried out using a divide and conquer technique. Procedure(s) with comments: CATARACT EXTRACTION PHACO AND INTRAOCULAR LENS PLACEMENT ([facility]) (Right) - Lot # [id number] H [place]: 01:38.6 AP%: 25.0 CDE: 46.89. Irrigation/aspiration was used to remove the residual cortex and the capsular bag was inflated with DiscoVisc. The intraocular lens was inserted into the capsular bag using a pre-loaded UltraSert Delivery System. Irrigation/aspiration was used to remove the residual DiscoVisc. The wound was inflated with balanced salt and checked for leaks. None were found. Miostat was injected via the paracentesis track and 0.1 ml of Vigamox containing 1 mg of drug  was injected via the paracentesis track. The wound was checked for leaks again and none were found.   The bridal sutures were removed and two drops of Vigamox were placed on the eye. An eye shield was placed to protect the eye and the patient was discharged to the recovery area in good condition.   Devron Cohick MD

## 2016-07-15 NOTE — Anesthesia Procedure Notes (Signed)
Procedure Name: MAC Performed by: Willo Yoon Pre-anesthesia Checklist: Patient identified, Emergency Drugs available, Suction available, Patient being monitored and Timeout performed Oxygen Delivery Method: Nasal cannula       

## 2016-07-15 NOTE — Interval H&P Note (Signed)
History and Physical Interval Note:  07/15/2016 9:56 AM  Wendy Patton  has presented today for surgery, with the diagnosis of CATARACT  The various methods of treatment have been discussed with the patient and family. After consideration of risks, benefits and other options for treatment, the patient has consented to  Procedure(s): CATARACT EXTRACTION PHACO AND INTRAOCULAR LENS PLACEMENT ([place]) (Right) as a surgical intervention .  The patient's history has been reviewed, patient examined, no change in status, stable for surgery.  I have reviewed the patient's chart and labs.  Questions were answered to the patient's satisfaction.     Srijan Givan

## 2016-07-15 NOTE — Anesthesia Postprocedure Evaluation (Signed)
Anesthesia Post Note  Patient: Wendy Patton  Procedure(s) Performed: Procedure(s) (LRB): CATARACT EXTRACTION PHACO AND INTRAOCULAR LENS PLACEMENT ([facility]) (Right)  Patient location during evaluation: PACU Anesthesia Type: MAC Level of consciousness: oriented and awake and alert Pain management: satisfactory to patient Vital Signs Assessment: post-procedure vital signs reviewed and stable Respiratory status: respiratory function stable Cardiovascular status: stable Anesthetic complications: no    Last Vitals:  Vitals:   07/15/16 0911  BP: (!) 163/70  Pulse: 62  Resp: 16  Temp: (!) 35.6 C    Last Pain:  Vitals:   07/15/16 0911  TempSrc: Oral                 Blima Singer

## 2016-07-15 NOTE — Discharge Instructions (Signed)

## 2016-07-15 NOTE — Anesthesia Preprocedure Evaluation (Signed)
Anesthesia Evaluation  Patient identified by MRN, date of birth, ID band Patient awake    Reviewed: Allergy & Precautions, H&P , NPO status , Patient's Chart, lab work & pertinent test results, reviewed documented beta blocker date and time   History of Anesthesia Complications (+) PONV, DIFFICULT AIRWAY and history of anesthetic complications  Airway Mallampati: II   Neck ROM: full    Dental  (+) Poor Dentition   Pulmonary neg pulmonary ROS, shortness of breath, COPD,    Pulmonary exam normal        Cardiovascular negative cardio ROS Normal cardiovascular exam+ Valvular Problems/Murmurs  Rhythm:regular Rate:Normal     Neuro/Psych  Headaches,  Neuromuscular disease negative neurological ROS  negative psych ROS   GI/Hepatic negative GI ROS, Neg liver ROS, GERD  Medicated,  Endo/Other  negative endocrine ROS  Renal/GU Renal diseasenegative Renal ROS  negative genitourinary   Musculoskeletal   Abdominal   Peds  Hematology negative hematology ROS (+) anemia ,   Anesthesia Other Findings Past Medical History: No date: Anemia     Comment: after jaw surgery was on iron for short period              of time No date: Arthritis No date: Cancer ([place])     Comment: squamous cell No date: CKD (chronic kidney disease) No date: COPD (chronic obstructive pulmonary disease) (* No date: Difficult intubation     Comment: potential for difficult airway due to limited               mouth opening No date: Dry skin No date: GERD (gastroesophageal reflux disease)     Comment: takes Aciphex daily No date: Headache(784.0)     Comment: hx of migraines  No date: Heart murmur No date: History of migraine     Comment: last one Aug 2014 and takes Imitrex daily as               needed No date: History of staph infection No date: IBS (irritable bowel syndrome) No date: Joint pain No date: Joint swelling No date: Laryngopharyngeal  reflux No date: MVP (mitral valve prolapse) No date: Neuralgia No date: Neuropathy ([place])     Comment: takes gabapentin daily No date: Osteopenia No date: PONV (postoperative nausea and vomiting)     Comment: TMJ surgery several times No date: Rheumatic fever     Comment: as a child No date: Shortness of breath     Comment: with exertion No date: Thyroid disease Past Surgical History: 2009: ABDOMINAL HYSTERECTOMY 01/11/2014: BREAST DUCTAL SYSTEM EXCISION Right     Comment: Procedure: MAJOR DUCT EXCISION RIGHT BREAST;                Surgeon: Stark Klein, MD;  Location: [facility];                Service: General;  Laterality: Right; 2015: BREAST EXCISIONAL BIOPSY Right     Comment: neg 2010: BREAST EXCISIONAL BIOPSY Left     Comment: benign papilloma removed 1975: BREAST EXCISIONAL BIOPSY Left     Comment: neg 1975, 2010: BREAST SURGERY     Comment: benign breast tumor 1975: breat excision     Comment: benign breast tumor excision 1999: cervial polyps 1967: CESAREAN SECTION No date: COLONOSCOPY 1962: CYSTECTOMY     Comment: vaginal cyst removal 1984: DILATION AND CURETTAGE OF UTERUS No date: ESOPHAGOGASTRODUODENOSCOPY 2002: granuloma     Comment: chin granuloma removed 1980, 1984, 1986, 1993, 2002, 2004: MANDIBLE  FRACTURE SURGERY     Comment: prosthesis, condyles implants, fossa replaced 1993: SIGMOIDOSCOPY 2008: SQUAMOUS CELL CARCINOMA EXCISION     Comment: left ankle/skin graft 1984: [place] SURGERY 2006: THYROIDECTOMY     Comment: left 1944: TONSILLECTOMY AND ADENOIDECTOMY 09/12/2013: TOTAL HIP ARTHROPLASTY Left     Comment: Procedure: LEFT TOTAL HIP ARTHROPLASTY               ANTERIOR APPROACH;  Surgeon: [name],               MD;  Location: [facility];  Service: Orthopedics;                Laterality: Left; 2000, 2004: TRAPEZIUM RESECTION     Comment: removal right & left hand trapezium No date: TUBAL LIGATION 2008: uterine polyps 1962:  vaginal cyst removed BMI    Body Mass Index:  19.94 kg/m     Reproductive/Obstetrics negative OB ROS                             Anesthesia Physical Anesthesia Plan  ASA: III  Anesthesia Plan: MAC   Post-op Pain Management:    Induction:   Airway Management Planned:   Additional Equipment:   Intra-op Plan:   Post-operative Plan:   Informed Consent: I have reviewed the patients History and Physical, chart, labs and discussed the procedure including the risks, benefits and alternatives for the proposed anesthesia with the patient or authorized representative who has indicated his/her understanding and acceptance.   Dental Advisory Given  Plan Discussed with: CRNA  Anesthesia Plan Comments:         Anesthesia Quick Evaluation

## 2016-07-15 NOTE — Transfer of Care (Signed)
Immediate Anesthesia Transfer of Care Note  Patient: Wendy Patton  Procedure(s) Performed: Procedure(s) with comments: CATARACT EXTRACTION PHACO AND INTRAOCULAR LENS PLACEMENT ([facility]) (Right) - Lot # [id number] H [place]: 01:38.6 AP%: 25.0 CDE: 46.89  Patient Location: PACU  Anesthesia Type:MAC  Level of Consciousness: awake, alert  and oriented  Airway & Oxygen Therapy: Patient Spontanous Breathing  Post-op Assessment: Report given to RN and Post -op Vital signs reviewed and stable  Post vital signs: Reviewed and stable  Last Vitals:  Vitals:   07/15/16 0911  BP: (!) 163/70  Pulse: 62  Resp: 16  Temp: (!) 35.6 C    Last Pain:  Vitals:   07/15/16 0911  TempSrc: Oral         Complications: No apparent anesthesia complications

## 2016-07-16 ENCOUNTER — Encounter: Payer: Self-pay | Admitting: Ophthalmology

## 2016-08-31 ENCOUNTER — Encounter: Payer: Self-pay | Admitting: *Deleted

## 2016-09-01 NOTE — H&P (Signed)
See scanned note.

## 2016-09-02 ENCOUNTER — Ambulatory Visit: Payer: Medicare Other | Admitting: Anesthesiology

## 2016-09-02 ENCOUNTER — Encounter
Admission: RE | Disposition: A | Discharge status: Home / Self Care | Payer: Self-pay | Source: Ambulatory Visit | Attending: Ophthalmology

## 2016-09-02 ENCOUNTER — Ambulatory Visit
Admission: RE | Admit: 2016-09-02 | Discharge: 2016-09-02 | Disposition: A | Discharge status: Home / Self Care | Payer: Medicare Other | Source: Ambulatory Visit | Attending: Ophthalmology | Admitting: Ophthalmology

## 2016-09-02 ENCOUNTER — Encounter: Payer: Self-pay | Admitting: *Deleted

## 2016-09-02 DIAGNOSIS — J449 Chronic obstructive pulmonary disease, unspecified: Secondary | ICD-10-CM | POA: Insufficient documentation

## 2016-09-02 DIAGNOSIS — E1136 Type 2 diabetes mellitus with diabetic cataract: Secondary | ICD-10-CM | POA: Diagnosis not present

## 2016-09-02 DIAGNOSIS — G709 Myoneural disorder, unspecified: Secondary | ICD-10-CM | POA: Diagnosis not present

## 2016-09-02 DIAGNOSIS — K219 Gastro-esophageal reflux disease without esophagitis: Secondary | ICD-10-CM | POA: Diagnosis not present

## 2016-09-02 DIAGNOSIS — I10 Essential (primary) hypertension: Secondary | ICD-10-CM | POA: Insufficient documentation

## 2016-09-02 DIAGNOSIS — D649 Anemia, unspecified: Secondary | ICD-10-CM | POA: Diagnosis not present

## 2016-09-02 DIAGNOSIS — R51 Headache: Secondary | ICD-10-CM | POA: Insufficient documentation

## 2016-09-02 DIAGNOSIS — Z79899 Other long term (current) drug therapy: Secondary | ICD-10-CM | POA: Diagnosis not present

## 2016-09-02 HISTORY — PX: CATARACT EXTRACTION W/PHACO: SHX586

## 2016-09-02 SURGERY — PHACOEMULSIFICATION, CATARACT, WITH IOL INSERTION
Anesthesia: Monitor Anesthesia Care | Site: Eye | Laterality: Left | Wound class: Clean

## 2016-09-02 MED ORDER — CYCLOPENTOLATE HCL 2 % OP SOLN
OPHTHALMIC | Status: AC
  Administered 2016-09-02: 1 [drp] via OPHTHALMIC
  Filled 2016-09-02: qty 2

## 2016-09-02 MED ORDER — EPINEPHRINE PF 1 MG/ML IJ SOLN
INTRAMUSCULAR | Status: AC
  Filled 2016-09-02: qty 2

## 2016-09-02 MED ORDER — LIDOCAINE HCL (PF) 4 % IJ SOLN
INTRAMUSCULAR | Status: DC | PRN
  Administered 2016-09-02: 9 mL via OPHTHALMIC

## 2016-09-02 MED ORDER — CEFUROXIME OPHTHALMIC INJECTION 1 MG/0.1 ML
INJECTION | OPHTHALMIC | Status: AC
  Filled 2016-09-02: qty 0.1

## 2016-09-02 MED ORDER — MIDAZOLAM HCL 2 MG/2ML IJ SOLN
INTRAMUSCULAR | Status: DC | PRN
  Administered 2016-09-02: 0.5 mg via INTRAVENOUS

## 2016-09-02 MED ORDER — CEFUROXIME OPHTHALMIC INJECTION 1 MG/0.1 ML
INJECTION | OPHTHALMIC | Status: DC | PRN
  Administered 2016-09-02: 0.1 mL via INTRACAMERAL

## 2016-09-02 MED ORDER — POVIDONE-IODINE 5 % OP SOLN
OPHTHALMIC | Status: DC | PRN
  Administered 2016-09-02: 1 via OPHTHALMIC

## 2016-09-02 MED ORDER — MOXIFLOXACIN HCL 0.5 % OP SOLN
OPHTHALMIC | Status: AC
  Administered 2016-09-02: 1 [drp] via OPHTHALMIC
  Filled 2016-09-02: qty 3

## 2016-09-02 MED ORDER — CYCLOPENTOLATE HCL 2 % OP SOLN: 1.0000 [drp] | OPHTHALMIC | Status: AC

## 2016-09-02 MED ORDER — MOXIFLOXACIN HCL 0.5 % OP SOLN: 1.0000 [drp] | OPHTHALMIC | Status: AC

## 2016-09-02 MED ORDER — CYCLOPENTOLATE HCL 2 % OP SOLN
1.0000 [drp] | OPHTHALMIC | Status: AC
  Administered 2016-09-02 (×4): 1 [drp] via OPHTHALMIC

## 2016-09-02 MED ORDER — POVIDONE-IODINE 5 % OP SOLN
OPHTHALMIC | Status: AC
  Filled 2016-09-02: qty 30

## 2016-09-02 MED ORDER — TETRACAINE HCL 0.5 % OP SOLN
OPHTHALMIC | Status: DC | PRN
  Administered 2016-09-02: 1 [drp] via OPHTHALMIC

## 2016-09-02 MED ORDER — MOXIFLOXACIN HCL 0.5 % OP SOLN
OPHTHALMIC | Status: DC | PRN
  Administered 2016-09-02: 1 [drp] via OPHTHALMIC

## 2016-09-02 MED ORDER — NA CHONDROIT SULF-NA HYALURON 40-17 MG/ML IO SOLN
INTRAOCULAR | Status: AC
  Filled 2016-09-02: qty 1

## 2016-09-02 MED ORDER — PHENYLEPHRINE HCL 10 % OP SOLN
OPHTHALMIC | Status: AC
  Filled 2016-09-02: qty 5

## 2016-09-02 MED ORDER — EPINEPHRINE PF 1 MG/ML IJ SOLN
INTRAOCULAR | Status: DC | PRN
  Administered 2016-09-02: 1 mL via OPHTHALMIC

## 2016-09-02 MED ORDER — MOXIFLOXACIN HCL 0.5 % OP SOLN
1.0000 [drp] | OPHTHALMIC | Status: AC
  Administered 2016-09-02 (×3): 1 [drp] via OPHTHALMIC

## 2016-09-02 MED ORDER — PHENYLEPHRINE HCL 10 % OP SOLN
1.0000 [drp] | OPHTHALMIC | Status: AC
  Administered 2016-09-02 (×4): 1 [drp] via OPHTHALMIC

## 2016-09-02 MED ORDER — HYALURONIDASE HUMAN 150 UNIT/ML IJ SOLN
INTRAMUSCULAR | Status: AC
  Filled 2016-09-02: qty 1

## 2016-09-02 MED ORDER — SODIUM CHLORIDE 0.9 % IV SOLN
INTRAVENOUS | Status: DC
  Administered 2016-09-02 (×2): via INTRAVENOUS

## 2016-09-02 MED ORDER — CARBACHOL 0.01 % IO SOLN
INTRAOCULAR | Status: DC | PRN
  Administered 2016-09-02: 0.5 mL via INTRAOCULAR

## 2016-09-02 MED ORDER — ALFENTANIL 500 MCG/ML IJ INJ
INJECTION | INTRAMUSCULAR | Status: DC | PRN
  Administered 2016-09-02 (×2): 250 ug via INTRAVENOUS

## 2016-09-02 MED ORDER — LIDOCAINE HCL (PF) 4 % IJ SOLN
INTRAMUSCULAR | Status: AC
  Filled 2016-09-02: qty 5

## 2016-09-02 MED ORDER — MIDAZOLAM HCL 2 MG/2ML IJ SOLN
INTRAMUSCULAR | Status: AC
  Filled 2016-09-02: qty 2

## 2016-09-02 MED ORDER — BUPIVACAINE HCL (PF) 0.75 % IJ SOLN
INTRAMUSCULAR | Status: AC
  Filled 2016-09-02: qty 10

## 2016-09-02 MED ORDER — LIDOCAINE HCL (PF) 4 % IJ SOLN
INTRAOCULAR | Status: DC | PRN
  Administered 2016-09-02: 4 mL via OPHTHALMIC

## 2016-09-02 MED ORDER — TETRACAINE HCL 0.5 % OP SOLN
OPHTHALMIC | Status: AC
  Filled 2016-09-02: qty 2

## 2016-09-02 MED ORDER — NA CHONDROIT SULF-NA HYALURON 40-17 MG/ML IO SOLN
INTRAOCULAR | Status: DC | PRN
  Administered 2016-09-02: 1 mL via INTRAOCULAR

## 2016-09-02 SURGICAL SUPPLY — 29 items

## 2016-09-02 NOTE — Interval H&P Note (Signed)
History and Physical Interval Note:  09/02/2016 7:32 AM  Wendy Patton  has presented today for surgery, with the diagnosis of Nuclear Sclerotic Cataract Left Eye  The various methods of treatment have been discussed with the patient and family. After consideration of risks, benefits and other options for treatment, the patient has consented to  Procedure(s) with comments: CATARACT EXTRACTION PHACO AND INTRAOCULAR LENS PLACEMENT ([facility]) (Left) - [place] AP% CDE Fluid pack lot # [id number] as a surgical intervention .  The patient's history has been reviewed, patient examined, no change in status, stable for surgery.  I have reviewed the patient's chart and labs.  Questions were answered to the patient's satisfaction.     Flem Enderle

## 2016-09-02 NOTE — Anesthesia Post-op Follow-up Note (Cosign Needed)
Anesthesia QCDR form completed.        

## 2016-09-02 NOTE — Interval H&P Note (Signed)
History and Physical Interval Note:  09/02/2016 9:08 AM  Wendy Patton  has presented today for surgery, with the diagnosis of Nuclear Sclerotic Cataract Left Eye  The various methods of treatment have been discussed with the patient and family. After consideration of risks, benefits and other options for treatment, the patient has consented to  Procedure(s) with comments: CATARACT EXTRACTION PHACO AND INTRAOCULAR LENS PLACEMENT ([facility]) (Left) - [place] AP% CDE Fluid pack lot # [id number] as a surgical intervention .  The patient's history has been reviewed, patient examined, no change in status, stable for surgery.  I have reviewed the patient's chart and labs.  Questions were answered to the patient's satisfaction.     [name]

## 2016-09-02 NOTE — Anesthesia Preprocedure Evaluation (Signed)
Anesthesia Evaluation  Patient identified by MRN, date of birth, ID band Patient awake    Reviewed: Allergy & Precautions, H&P , NPO status , Patient's Chart, lab work & pertinent test results, reviewed documented beta blocker date and time   History of Anesthesia Complications (+) PONV, DIFFICULT AIRWAY and history of anesthetic complications  Airway Mallampati: II  TM Distance: >3 FB Neck ROM: full    Dental no notable dental hx. (+) Teeth Intact   Pulmonary neg pulmonary ROS, shortness of breath and with exertion, COPD,    Pulmonary exam normal breath sounds clear to auscultation       Cardiovascular Exercise Tolerance: Good hypertension, negative cardio ROS Normal cardiovascular exam+ Valvular Problems/Murmurs MVP  Rhythm:regular Rate:Normal     Neuro/Psych  Headaches,  Neuromuscular disease negative neurological ROS  negative psych ROS   GI/Hepatic negative GI ROS, Neg liver ROS, GERD  Medicated,  Endo/Other  negative endocrine ROSdiabetes  Renal/GU CRFRenal diseasenegative Renal ROS  negative genitourinary   Musculoskeletal   Abdominal   Peds  Hematology negative hematology ROS (+) anemia ,   Anesthesia Other Findings   Reproductive/Obstetrics negative OB ROS                             Anesthesia Physical Anesthesia Plan  ASA: IV  Anesthesia Plan: MAC   Post-op Pain Management:    Induction:   Airway Management Planned:   Additional Equipment:   Intra-op Plan:   Post-operative Plan:   Informed Consent: I have reviewed the patients History and Physical, chart, labs and discussed the procedure including the risks, benefits and alternatives for the proposed anesthesia with the patient or authorized representative who has indicated his/her understanding and acceptance.     Plan Discussed with: CRNA  Anesthesia Plan Comments:         Anesthesia Quick  Evaluation

## 2016-09-02 NOTE — Transfer of Care (Signed)
Immediate Anesthesia Transfer of Care Note  Patient: Wendy Patton  Procedure(s) Performed: Procedure(s) with comments: CATARACT EXTRACTION PHACO AND INTRAOCULAR LENS PLACEMENT (IOC) (Left) - [place] 2:05.3 AP% 24.9 CDE 51.84 Fluid pack lot # [id number]  Patient Location: PACU and Short Stay  Anesthesia Type:MAC  Level of Consciousness: awake, oriented and patient cooperative  Airway & Oxygen Therapy: Patient Spontanous Breathing  Post-op Assessment: Report given to RN and Post -op Vital signs reviewed and stable  Post vital signs: Reviewed and stable  Last Vitals:  Vitals:   09/02/16 0746  BP: (!) 163/81  Pulse: 72  Resp: 20  Temp: 36.3 C    Last Pain:  Vitals:   09/02/16 0746  TempSrc: Tympanic         Complications: No apparent anesthesia complications

## 2016-09-02 NOTE — Discharge Instructions (Signed)
Eye Surgery Discharge Instructions  Expect mild scratchy sensation or mild soreness. DO NOT RUB YOUR EYE!  The day of surgery:  Minimal physical activity, but bed rest is not required  No reading, computer work, or close hand work  No bending, lifting, or straining.  May watch TV  For 24 hours:  No driving, legal decisions, or alcoholic beverages  Safety precautions  Eat anything you prefer: It is better to start with liquids, then soup then solid foods.  _____ Eye patch should be worn until postoperative exam tomorrow.  ____ Solar shield eyeglasses should be worn for comfort in the sunlight/patch while sleeping  Resume all regular medications including aspirin or Coumadin if these were discontinued prior to surgery. You may shower, bathe, shave, or wash your hair. Tylenol may be taken for mild discomfort.  Call your doctor if you experience significant pain, nausea, or vomiting, fever > 101 or other signs of infection. [phone] or [phone] Specific instructions:  Follow-up Information    Ladavion Savitz, MD Follow up.   Specialty:  Ophthalmology Why:  February 1 at 10:40am Contact information: [address]   [place] [address] [phone]

## 2016-09-02 NOTE — OR Nursing (Signed)
IV removed without difficulty. No edema or redness.

## 2016-09-02 NOTE — Op Note (Signed)
Date of Surgery: 09/02/2016 Date of Dictation: 09/02/2016 9:53 AM Pre-operative Diagnosis:  Nuclear Sclerotic Cataract left Eye Post-operative Diagnosis: same Procedure performed: Extra-capsular Cataract Extraction (ECCE) with placement of a posterior chamber intraocular lens (IOL) left Eye IOL:  Implant Name Type Inv. Item Serial No. Manufacturer Lot No. LRB No. Used  LENS IOL ACRYSOF IQ 21.0 - [id number] Intraocular Lens LENS IOL ACRYSOF IQ 21.0 [id number] ALCON   Left 1   Anesthesia: 2% Lidocaine and 4% Marcaine in a 50/50 mixture with 10 unites/ml of Hylenex given as a peribulbar Anesthesiologist: Anesthesiologist: Molli Barrows, MD CRNA: Courtney Paris, CRNA Complications: none Estimated Blood Loss: less than 1 ml  Description of procedure:  The patient was given anesthesia and sedation via intravenous access. The patient was then prepped and draped in the usual fashion. A 25-gauge needle was bent for initiating the capsulorhexis. A 5-0 silk suture was placed through the conjunctiva superior and inferiorly to serve as bridle sutures. Hemostasis was obtained at the superior limbus using an eraser cautery. A partial thickness groove was made at the anterior surgical limbus with a 64 Beaver blade and this was dissected anteriorly with an [employer]aya. The anterior chamber was entered at 10 o'clock with a 1.0 mm paracentesis knife and through the lamellar dissection with a 2.6 mm Alcon keratome. Epi-Shugarcaine 0.5 CC [9 cc BSS Plus (Alcon), 3 cc 4% preservative-free lidocaine (Hospira) and 4 cc 1:1000 preservative-free, bisulfite-free epinephrine] was injected into the anterior chamber via the paracentesis tract. Epi-Shugarcaine 0.5 CC [9 cc BSS Plus (Alcon), 3 cc 4% preservative-free lidocaine (Hospira) and 4 cc 1:1000 preservative-free, bisulfite-free epinephrine] was injected into the anterior chamber via the paracentesis tract. DiscoVisc was injected to replace the aqueous and a  continuous tear curvilinear capsulorhexis was performed using a bent 25-gauge needle.  Balance salt on a syringe was used to perform hydro-dissection and phacoemulsification was carried out using a divide and conquer technique. Procedure(s) with comments: CATARACT EXTRACTION PHACO AND INTRAOCULAR LENS PLACEMENT (IOC) (Left) - [place] 2:05.3 AP% 24.9 CDE 51.84 Fluid pack lot # [id number]. Irrigation/aspiration was used to remove the residual cortex and the capsular bag was inflated with DiscoVisc. The intraocular lens was inserted into the capsular bag using a pre-loaded UltraSert Delivery System. Irrigation/aspiration was used to remove the residual DiscoVisc. The wound was inflated with balanced salt and checked for leaks. None were found. Miostat was injected via the paracentesis track and 0.1 ml of cefuroxime containing 1 mg of drug  was injected via the paracentesis track. The wound was checked for leaks again and none were found.   The bridal sutures were removed and two drops of Vigamox were placed on the eye. An eye shield was placed to protect the eye and the patient was discharged to the recovery area in good condition.   Shrita Thien MD

## 2016-09-03 NOTE — Anesthesia Postprocedure Evaluation (Signed)
Anesthesia Post Note  Patient: Wendy Patton  Procedure(s) Performed: Procedure(s) (LRB): CATARACT EXTRACTION PHACO AND INTRAOCULAR LENS PLACEMENT (IOC) (Left)  Patient location during evaluation: PACU Anesthesia Type: MAC Level of consciousness: awake and alert Pain management: pain level controlled Vital Signs Assessment: post-procedure vital signs reviewed and stable Respiratory status: spontaneous breathing, nonlabored ventilation, respiratory function stable and patient connected to nasal cannula oxygen Cardiovascular status: stable and blood pressure returned to baseline Anesthetic complications: no     Last Vitals:  Vitals:   09/02/16 0955 09/02/16 1008  BP: 133/73 (!) 152/64  Pulse: (!) 56   Resp:    Temp: 36.4 C     Last Pain:  Vitals:   09/02/16 0955  TempSrc: Tympanic                 Molli Barrows

## 2016-09-09 ENCOUNTER — Telehealth: Payer: Self-pay | Admitting: Family Medicine

## 2016-09-09 NOTE — Telephone Encounter (Signed)
Pt would like Bob to return her call about the prior authorization for estradiol (ESTRACE) 1 MG tablet. Thanks TNP

## 2016-09-09 NOTE — Telephone Encounter (Signed)
Will bring in paperwork related to pre-authorization of estradiol.

## 2016-09-22 ENCOUNTER — Other Ambulatory Visit: Payer: Self-pay | Admitting: Family Medicine

## 2016-09-22 ENCOUNTER — Encounter: Payer: Self-pay | Admitting: Family Medicine

## 2016-09-22 DIAGNOSIS — N952 Postmenopausal atrophic vaginitis: Secondary | ICD-10-CM | POA: Insufficient documentation

## 2016-09-22 DIAGNOSIS — N951 Menopausal and female climacteric states: Secondary | ICD-10-CM | POA: Insufficient documentation

## 2016-11-05 ENCOUNTER — Encounter: Payer: Self-pay | Admitting: Family Medicine

## 2016-11-05 ENCOUNTER — Ambulatory Visit (INDEPENDENT_AMBULATORY_CARE_PROVIDER_SITE_OTHER): Payer: Medicare Other | Admitting: Family Medicine

## 2016-11-05 VITALS — BP 128/72 | HR 73 | Temp 98.4°F | Resp 16 | Ht 69.0 in | Wt 142.8 lb

## 2016-11-05 DIAGNOSIS — Z131 Encounter for screening for diabetes mellitus: Secondary | ICD-10-CM

## 2016-11-05 DIAGNOSIS — E7800 Pure hypercholesterolemia, unspecified: Secondary | ICD-10-CM | POA: Diagnosis not present

## 2016-11-05 DIAGNOSIS — Z8739 Personal history of other diseases of the musculoskeletal system and connective tissue: Secondary | ICD-10-CM

## 2016-11-05 DIAGNOSIS — Q809 Congenital ichthyosis, unspecified: Secondary | ICD-10-CM | POA: Insufficient documentation

## 2016-11-05 DIAGNOSIS — Z0000 Encounter for general adult medical examination without abnormal findings: Secondary | ICD-10-CM | POA: Diagnosis not present

## 2016-11-05 NOTE — Progress Notes (Addendum)
Subjective:     Patient ID: Wendy Patton, female   DOB: [birth year], 81 y.o.   MRN: [id number]  HPI  Chief Complaint  Patient presents with  . Annual Exam    Patient comes in office today for her annual physical, patient states that it was reccomended by her health insurance. Patient reports she is feeling very well today, following a well balanced diet, exercising 4-5x a week and sleeping on average 10hrs a night. Patients last reported Td 01/13/16, Tdap 07/02/05, Prevnar 08/20/14, Pneumo 05/09/09, Flu 04/03/16, BMD 10/11/14, Colonoscopy and Mammogran 07/06/16.   Defers BMD. Continues to stay active, exercising and is learning to play the piano again. Current consultants include: Dr. Thomasene Ripple, ophthalmology; Dr. Thereasa Parkin, dentist, Dr. Darrick Huntsman, dermatology; Dr. Allen Norris, G.I., and Dr. Novella Olive, orthopedics.   Review of Systems General: Feeling well HEENT: regular dental visits and recent cataract surgery. Cardiovascular: no chest pain, shortness of breath, or palpitations. Hx of MVP/murmur GI: no heartburn, no change in bowel habits or blood in the stool.Has laryngotracheal reflux which provokes s.o.b so remains on PPI. GU: nocturia x 1, no change in bladder habits. Currently takes Estrace 4 days a weeks for atrophic vaginitis and hot flushes.  Neuro: no change in memory Psychiatric: not depressed Musculoskeletal: chronic TMJ pain and knee pains (going from sit to stand) which have had prior evaluations and treatment. Last BMD 2016-normal. Prior hx of osteopenia.    Objective:   Physical Exam  Constitutional: She appears well-developed and well-nourished. No distress.  Eyes: Pupils equal with diminished reactivity to light, EOMI Neck: no thyromegaly, tenderness or nodules, carotids palpable without bruits ENT: TM's intact without inflammation; Tonsils absent; oral excursion limited due to TMJ arthritis. Lungs: Clear Breasts: defers exam Heart : RRR wth 3/6 systolic murmur Abd: bowel sounds  present, soft, non-tender, no organomegaly Extremities: no edema, Skin: dry/scaling/ichthyotic Neuro: defers cognitive screen     Assessment:    1. Medicare annual wellness visit, subsequent  2. Hypercholesteremia - Lipid panel  3. Screening for diabetes mellitus - Comprehensive metabolic panel  4. H/O osteopenia - VITAMIN D 25 Hydroxy (Vit-D Deficiency, Fractures)    Plan:   Further f/u pending lab work.

## 2016-11-05 NOTE — Patient Instructions (Signed)
We will call you with the lab results. 

## 2016-11-12 ENCOUNTER — Telehealth: Payer: Self-pay | Admitting: Family Medicine

## 2016-11-12 LAB — LIPID PANEL
CHOL/HDL RATIO: 2.9 ratio (ref 0.0–4.4)
CHOLESTEROL TOTAL: 280 mg/dL — AB (ref 100–199)
HDL: 97 mg/dL (ref >39)
LDL Calculated: 165 mg/dL — ABNORMAL HIGH (ref 0–99)
TRIGLYCERIDES: 89 mg/dL (ref 0–149)
VLDL Cholesterol Cal: 18 mg/dL (ref 5–40)

## 2016-11-12 LAB — COMPREHENSIVE METABOLIC PANEL
A/G RATIO: 1.6 (ref 1.2–2.2)
ALBUMIN: 4.5 g/dL (ref 3.5–4.8)
ALT: 12 IU/L (ref 0–32)
AST: 22 IU/L (ref 0–40)
Alkaline Phosphatase: 51 IU/L (ref 39–117)
BUN / CREAT RATIO: 20 (ref 12–28)
BUN: 16 mg/dL (ref 8–27)
Bilirubin Total: 0.4 mg/dL (ref 0.0–1.2)
CALCIUM: 9.9 mg/dL (ref 8.7–10.3)
CO2: 25 mmol/L (ref 18–29)
Chloride: 97 mmol/L (ref 96–106)
Creatinine, Ser: 0.82 mg/dL (ref 0.57–1.00)
GFR, EST AFRICAN AMERICAN: 79 mL/min/{1.73_m2} (ref >59)
GFR, EST NON AFRICAN AMERICAN: 69 mL/min/{1.73_m2} (ref >59)
Globulin, Total: 2.9 g/dL (ref 1.5–4.5)
Glucose: 86 mg/dL (ref 65–99)
POTASSIUM: 4.9 mmol/L (ref 3.5–5.2)
Sodium: 138 mmol/L (ref 134–144)
TOTAL PROTEIN: 7.4 g/dL (ref 6.0–8.5)

## 2016-11-12 LAB — VITAMIN D 25 HYDROXY (VIT D DEFICIENCY, FRACTURES): Vit D, 25-Hydroxy: 40.8 ng/mL (ref 30.0–100.0)

## 2016-11-12 NOTE — Telephone Encounter (Signed)
Pt is returning call.  CB#[phone]/MW °

## 2016-11-13 NOTE — Telephone Encounter (Signed)
Patient was returning [place] call from 4/11 in regards to lab report. [id number]

## 2016-11-13 NOTE — Telephone Encounter (Signed)
Patient was advised she states that she does not want to start a cholesterol lowering drug at this time. KW

## 2017-02-04 ENCOUNTER — Ambulatory Visit (INDEPENDENT_AMBULATORY_CARE_PROVIDER_SITE_OTHER): Payer: Medicare Other | Admitting: Family Medicine

## 2017-02-04 VITALS — BP 108/62 | HR 80 | Temp 98.1°F | Resp 16 | Wt 143.0 lb

## 2017-02-04 DIAGNOSIS — R609 Edema, unspecified: Secondary | ICD-10-CM | POA: Diagnosis not present

## 2017-02-04 NOTE — Patient Instructions (Signed)
Consider leg elevation when leg becomes swollen. We can also add a fluid pill as needed or add pressure stockings.

## 2017-02-04 NOTE — Progress Notes (Signed)
Subjective:     Patient ID: Wendy Patton, female   DOB: 1936/03/12, 81 y.o.   MRN: [id number]  HPI  Chief Complaint  Patient presents with  . Foot Swelling    Patient noticed since beginning of June she has had left foot swelling. Worse in the evening and is better in the mornings. No pain or redness. Patient does not recall any injury to the foot. Gradually getting worse, swelling comes on daily.     Review of Systems     Objective:   Physical Exam  Constitutional: She appears well-developed and well-nourished. No distress.  Cardiovascular:  Pulses:      Dorsalis pedis pulses are 2+ on the left side.       Posterior tibial pulses are 2+ on the left side.  Musculoskeletal: Edema: of left lower extremity on presentation.  Skin:  Superficial LLE varicosities noted.       Assessment:    1. Dependent edema: suspect due to incompetent venous valve    Plan:    Reassured not a DVT. Discussed elevation ( which she prefers) pressure stockings, diuretic prn, and vascular referral.

## 2017-03-09 ENCOUNTER — Encounter: Payer: Self-pay | Admitting: Family Medicine

## 2017-03-09 ENCOUNTER — Ambulatory Visit (INDEPENDENT_AMBULATORY_CARE_PROVIDER_SITE_OTHER): Payer: Medicare Other | Admitting: Family Medicine

## 2017-03-09 VITALS — BP 140/64 | HR 74 | Temp 98.2°F | Resp 16 | Wt 144.0 lb

## 2017-03-09 DIAGNOSIS — L858 Other specified epidermal thickening: Secondary | ICD-10-CM | POA: Diagnosis not present

## 2017-03-09 DIAGNOSIS — L821 Other seborrheic keratosis: Secondary | ICD-10-CM

## 2017-03-09 NOTE — Patient Instructions (Signed)
If your skin does not improve do follow up with Dr. Kellie Moor.

## 2017-03-09 NOTE — Progress Notes (Signed)
Subjective:     Patient ID: Wendy Patton, female   DOB: 05-Aug-1935, 81 y.o.   MRN: [id number]  HPI  Chief Complaint  Patient presents with  . Skin Problem    Patient comes in office today with concerns of a raised bump on the back of her right ankle that has been present for the past 2 weeks. Patient would also like to address mole on her left arm that she describes as painful to the touch and achy, patient states that in the past month she has had changes around skin and is now raised.   Has a hx of ichthyosis and SCC of her left lower leg followed by Dr. Kellie Moor.   Review of Systems     Objective:   Physical Exam  Constitutional: She appears well-developed and well-nourished. No distress.  Skin:  Left upper arm with 2 cm. well circumscribed, tan, slightly raised (with stuck on appearance) lesion c/w seb. Keratosis. Right posterior lower leg with raised inflammatory area with central cutaneous horn. Frozen with Cryopen x 45 seconds.       Assessment:    1. Cutaneous horn - Cryotherapy/destruction benign or premalignant lesion  2. Seborrheic keratosis     Plan:   F/u with dermatology if not improving.

## 2017-05-28 ENCOUNTER — Ambulatory Visit (INDEPENDENT_AMBULATORY_CARE_PROVIDER_SITE_OTHER): Payer: Medicare Other | Admitting: Family Medicine

## 2017-05-28 VITALS — BP 122/74 | HR 76 | Temp 97.4°F | Resp 14 | Wt 141.6 lb

## 2017-05-28 DIAGNOSIS — H6122 Impacted cerumen, left ear: Secondary | ICD-10-CM | POA: Diagnosis not present

## 2017-05-28 NOTE — Progress Notes (Signed)
Subjective:     Patient ID: Wendy Patton, female   DOB: October 11, 1935, 81 y.o.   MRN: [id number]  HPI  Chief Complaint  Patient presents with  . Ear Fullness    Patient states that her left ear feels stopped up since Tuesday 05/25/17. No pain, no drainage, no ringing, no fever. She has been using OTC drops andremedies with no relief. Sensation feels like it isgetting worse. No dizziness present.  States we have removed wax from her ear canals in the past.   Review of Systems     Objective:   Physical Exam  Constitutional: She appears well-developed and well-nourished. No distress.  HENT:  Right ear canal patent. Left ear canal blocked by cerumen Bilateral ear canals are small (hx of TMJ surgery) After irrigation per Wendy Patton:  Small amount of debris removed. Small canals/residual irrigation fluid prevent good visualization of her TM though no longer see impaction. Patient unsure of improvement due to water residual.     Assessment:    1. Impacted cerumen of left ear - EAR CERUMEN REMOVAL    Plan:    Follow up prn.

## 2017-05-28 NOTE — Patient Instructions (Signed)
Call if hearing not improving.

## 2017-05-31 ENCOUNTER — Other Ambulatory Visit: Payer: Self-pay | Admitting: Family Medicine

## 2017-05-31 DIAGNOSIS — Z1231 Encounter for screening mammogram for malignant neoplasm of breast: Secondary | ICD-10-CM

## 2017-06-02 ENCOUNTER — Other Ambulatory Visit: Payer: Self-pay | Admitting: Family Medicine

## 2017-06-02 DIAGNOSIS — N952 Postmenopausal atrophic vaginitis: Secondary | ICD-10-CM

## 2017-06-02 DIAGNOSIS — R350 Frequency of micturition: Secondary | ICD-10-CM

## 2017-06-02 DIAGNOSIS — Z780 Asymptomatic menopausal state: Secondary | ICD-10-CM

## 2017-06-13 IMAGING — MG MM DIGITAL SCREENING BILAT W/ TOMO W/ CAD
9 of 12 series · 9 of 28 positions shown · non-contrast
Comparison: Previous exam(s).

CLINICAL DATA: Screening.

EXAM:
2D DIGITAL SCREENING BILATERAL MAMMOGRAM WITH CAD AND ADJUNCT TOMO

[L MLO]
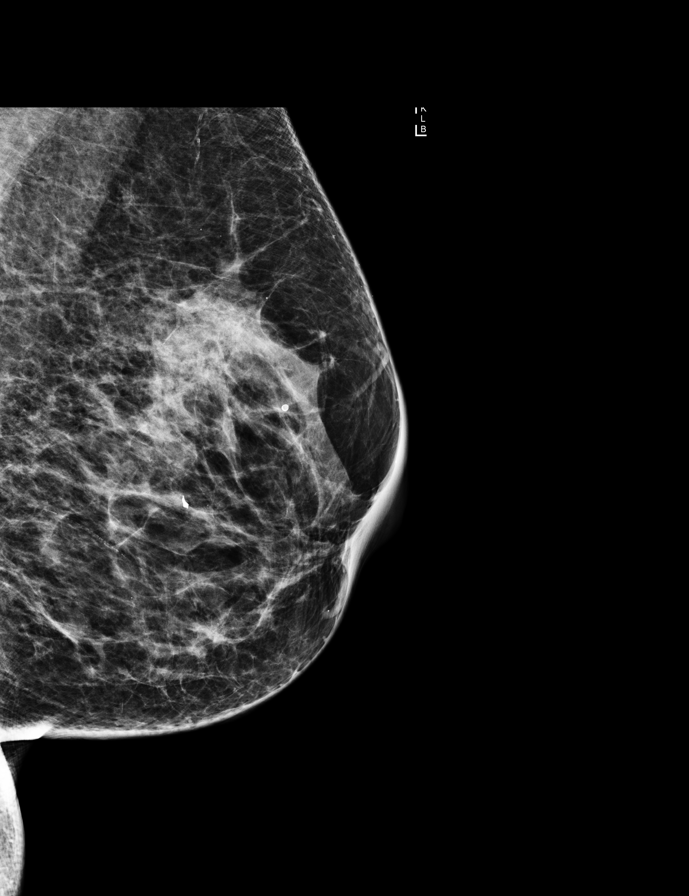

[R CC synth-2D]
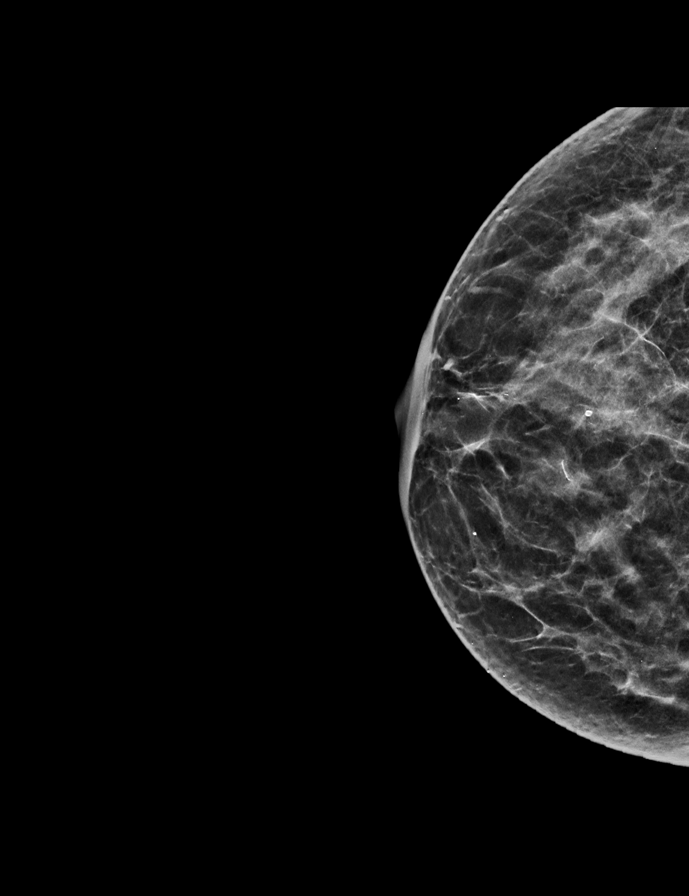

[R MLO synth-2D]
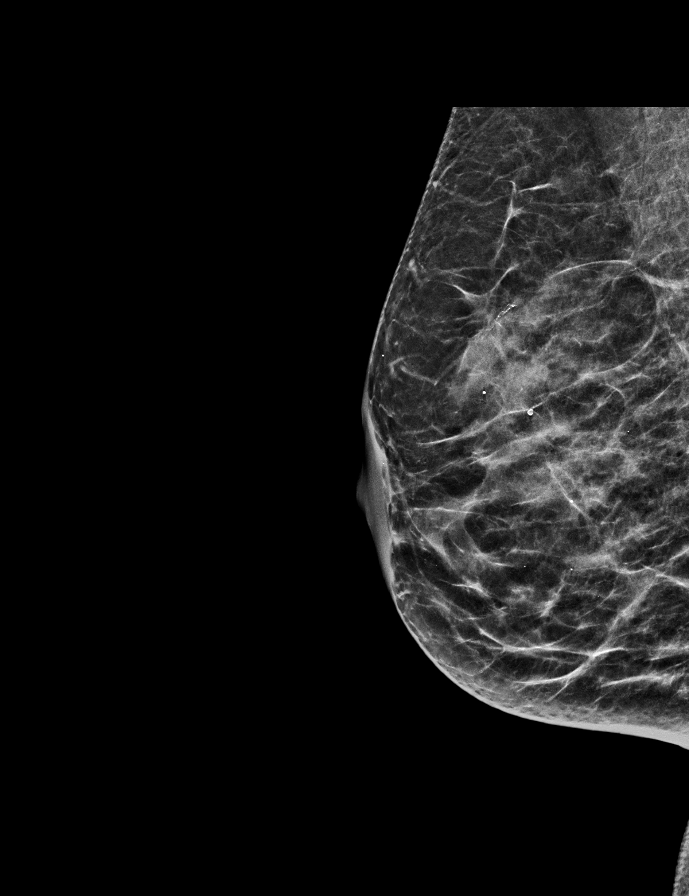

[L CC]
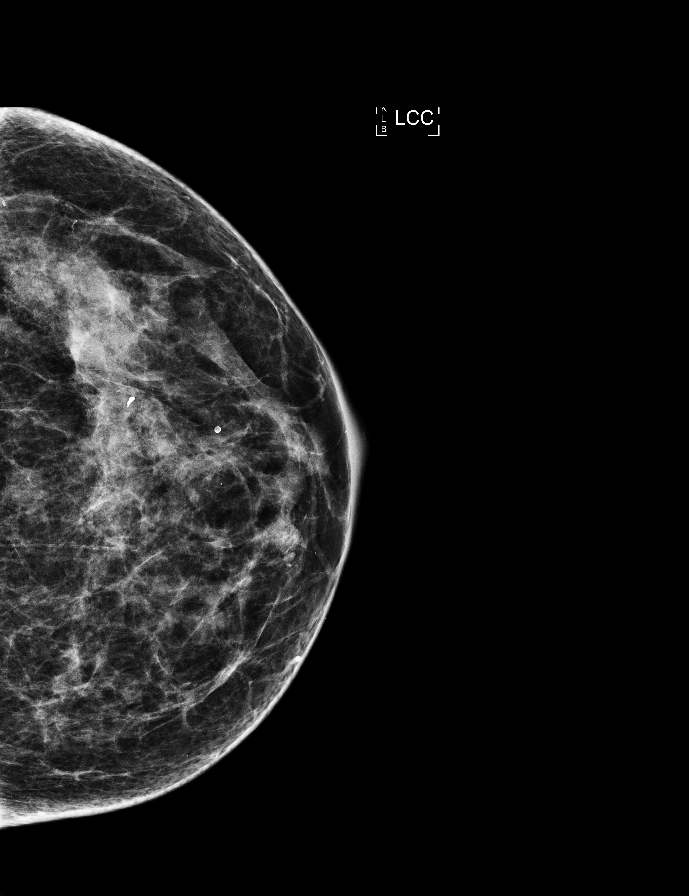

[L CC synth-2D]
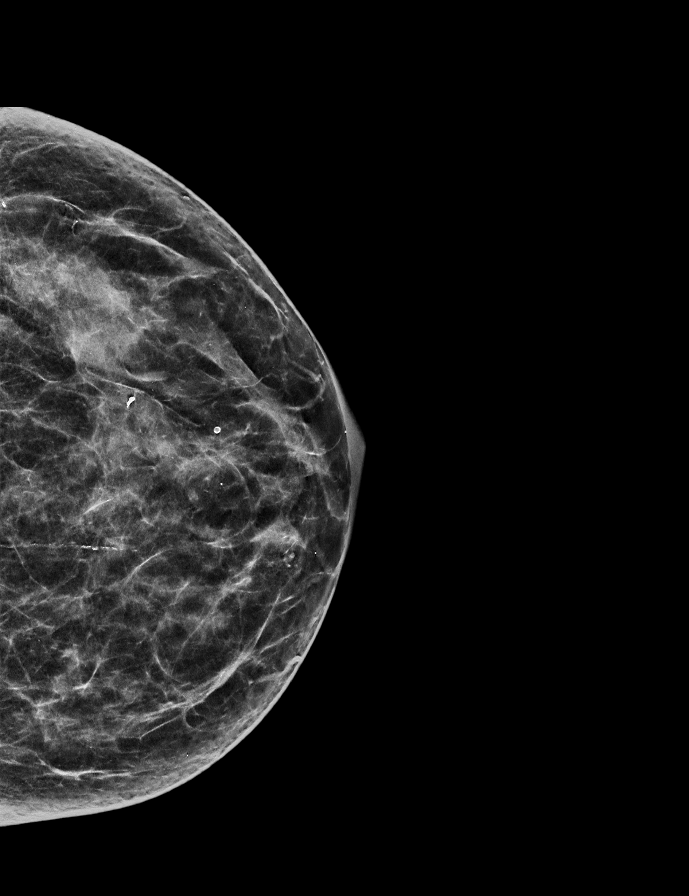

[R CC]
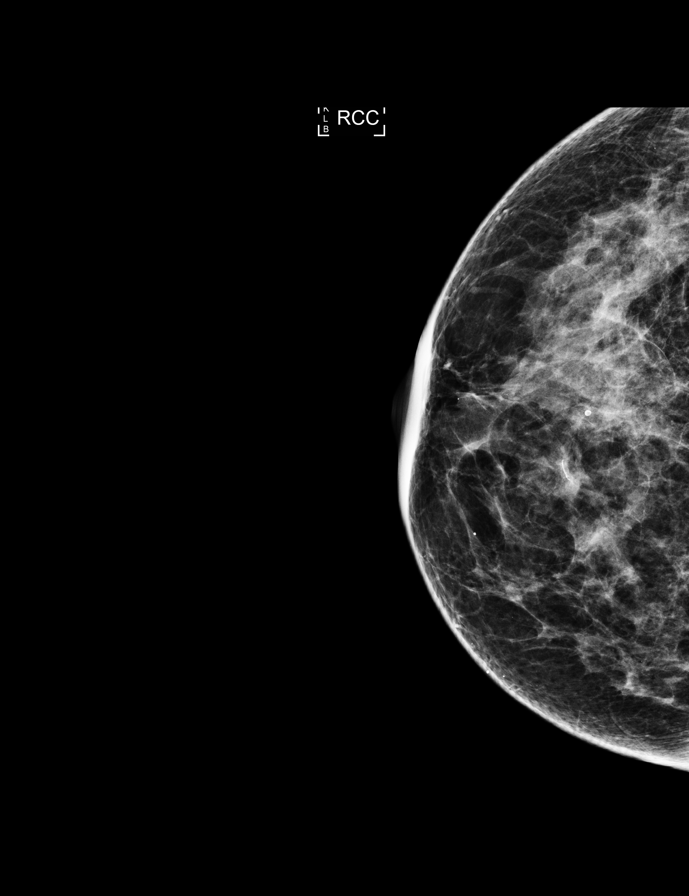

[R MLO]
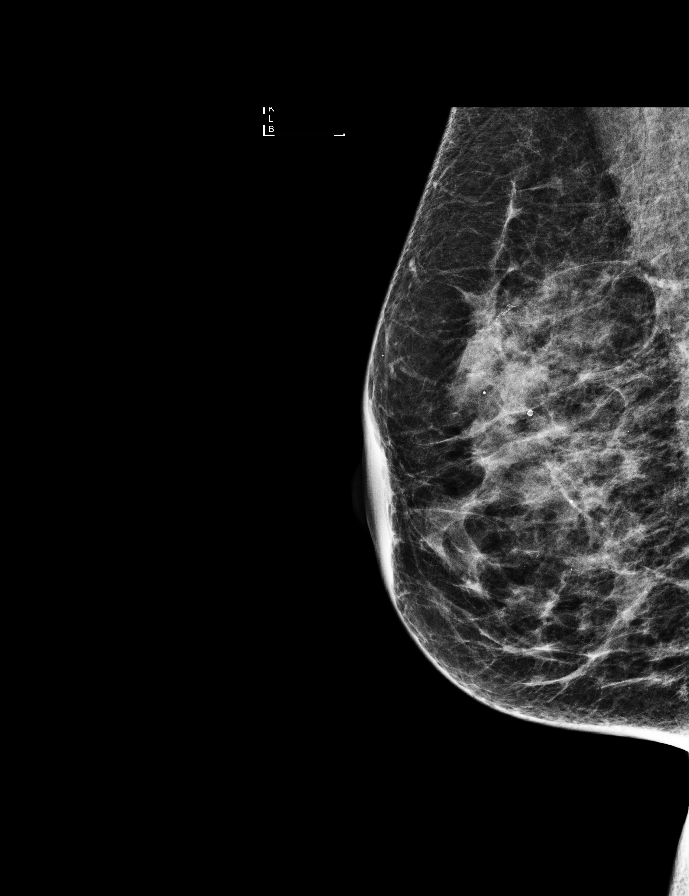

[L MLO synth-2D]
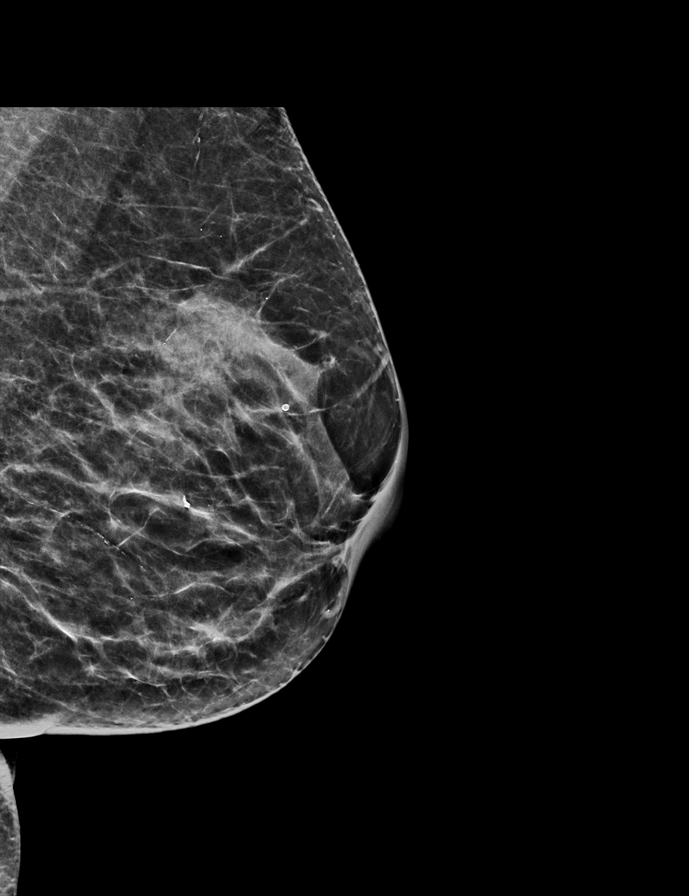

[R CC tomo · tomo slice 27/54.0]
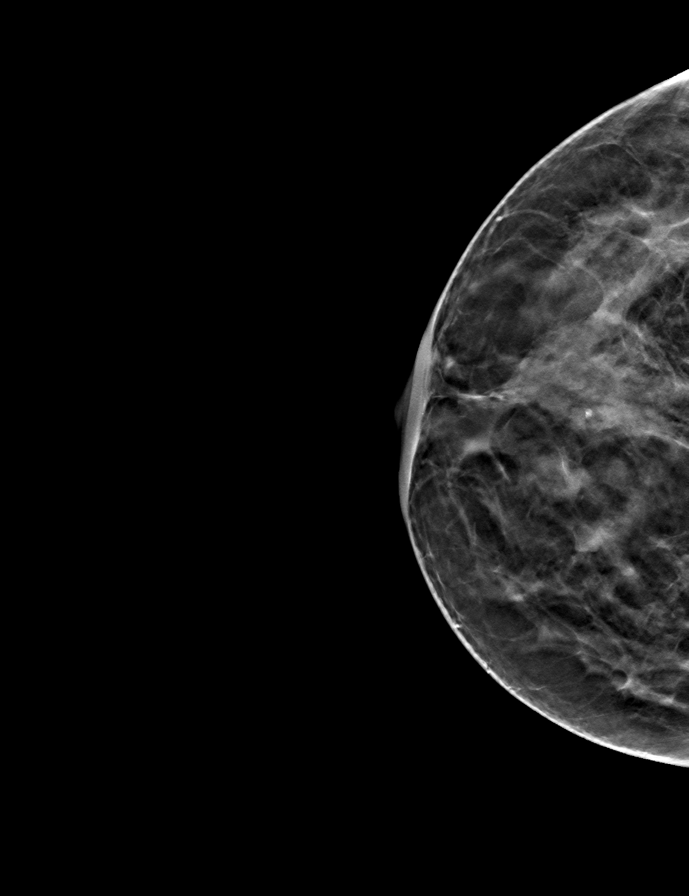

[9 of 28 positions shown; findings below may reference images not displayed]

ACR Breast Density Category c: The breast tissue is heterogeneously
dense, which may obscure small masses.
FINDINGS: There are no findings suspicious for malignancy. Images were
processed with CAD.
IMPRESSION: No mammographic evidence of malignancy. A result letter of this
screening mammogram will be mailed directly to the patient.

RECOMMENDATION:
Screening mammogram in one year. (Code:[id number]-[id number])

BI-RADS CATEGORY  1: Negative.

## 2017-07-23 ENCOUNTER — Encounter: Payer: Self-pay | Admitting: Family Medicine

## 2017-07-23 ENCOUNTER — Ambulatory Visit: Payer: Medicare Other | Admitting: Family Medicine

## 2017-07-23 VITALS — BP 140/70 | HR 73 | Temp 98.1°F | Resp 16 | Wt 143.6 lb

## 2017-07-23 DIAGNOSIS — H6122 Impacted cerumen, left ear: Secondary | ICD-10-CM | POA: Diagnosis not present

## 2017-07-23 MED ORDER — NEOMYCIN-POLYMYXIN-HC 3.5-10000-1 OT SOLN: OTIC | 0 refills | Status: DC

## 2017-07-23 NOTE — Patient Instructions (Signed)
Let us know if new symptoms or not improving. Use the antibiotic drops if your ear starts hurting or blocking up again.

## 2017-07-23 NOTE — Progress Notes (Signed)
Subjective:     Patient ID: Wendy Patton, female   DOB: 04-03-1936, 81 y.o.   MRN: [id number] Chief Complaint  Patient presents with  . Cerumen Impaction    Patient comes in office today with concerns of ear impaction on the left side for the past two days. Patient states that she has tried otc ear wax softner but it has only given her little relief, patient reports hearing popping sound in ear.    HPI Describes mild discomfort and decreased hearing.  Review of Systems     Objective:   Physical Exam  Constitutional: She appears well-developed and well-nourished. No distress.  HENT:  R ear canal patent with small amount of wax. Ear canals are small bilaterally due to prior TMJ surgery. Left ear canal with small os with question of possible wax plug. After irrigation per Kat: Ear canal appears more patent though still difficult to visualize the TM. Patient reports improvement in her sx.       Assessment:    1. Cerumen debris on tympanic membrane, left - neomycin-polymyxin-hydrocortisone (CORTISPORIN) OTIC solution; 4 drops in the affected ear 3-4 x day  Dispense: 10 mL; Refill: 0    Plan:    Will start abx if ear canal does not continue to feel better.

## 2017-08-04 ENCOUNTER — Ambulatory Visit: Payer: Medicare Other | Admitting: Family Medicine

## 2017-08-04 ENCOUNTER — Encounter: Payer: Self-pay | Admitting: Family Medicine

## 2017-08-04 VITALS — BP 138/74 | HR 80 | Temp 98.2°F | Resp 15 | Wt 140.8 lb

## 2017-08-04 DIAGNOSIS — H6122 Impacted cerumen, left ear: Secondary | ICD-10-CM

## 2017-08-04 NOTE — Progress Notes (Signed)
Subjective:     Patient ID: Wendy Patton, female   DOB: Jul 05, 1936, 82 y.o.   MRN: [id number] Chief Complaint  Patient presents with  . Follow-up    Patient returns to office today for follow up visit form 07/23/17, patient states that ear has improved but would like for it to be looked at again.    HPI States she used rx ear drops for 3 days after ear irrigation 07/23/17.  Review of Systems     Objective:   Physical Exam  Constitutional: She appears well-developed and well-nourished. No distress.  HENT:  R ear canal patent, TM intact L ear canal patent but ear canal deep and TM not well visualized. No drainage or tragal tenderness.       Assessment:    1. Cerumen debris on tympanic membrane, left: resolved.    Plan:    Discussed ENT evaluation if further sx.

## 2017-08-04 NOTE — Patient Instructions (Signed)
Do see your ENT doctor if your ear continues to give you worries.

## 2017-08-09 ENCOUNTER — Ambulatory Visit
Admission: RE | Admit: 2017-08-09 | Discharge: 2017-08-09 | Disposition: A | Discharge status: Home / Self Care | Payer: Medicare Other | Source: Ambulatory Visit | Attending: Family Medicine | Admitting: Family Medicine

## 2017-08-09 DIAGNOSIS — Z1231 Encounter for screening mammogram for malignant neoplasm of breast: Secondary | ICD-10-CM | POA: Diagnosis present

## 2017-08-10 ENCOUNTER — Other Ambulatory Visit: Payer: Self-pay | Admitting: Family Medicine

## 2017-08-10 DIAGNOSIS — N6310 Unspecified lump in the right breast, unspecified quadrant: Secondary | ICD-10-CM

## 2017-08-10 DIAGNOSIS — R928 Other abnormal and inconclusive findings on diagnostic imaging of breast: Secondary | ICD-10-CM

## 2017-08-19 ENCOUNTER — Ambulatory Visit
Admission: RE | Admit: 2017-08-19 | Discharge: 2017-08-19 | Disposition: A | Discharge status: Home / Self Care | Payer: Medicare Other | Source: Ambulatory Visit | Attending: Family Medicine | Admitting: Family Medicine

## 2017-08-19 DIAGNOSIS — R928 Other abnormal and inconclusive findings on diagnostic imaging of breast: Secondary | ICD-10-CM | POA: Diagnosis not present

## 2017-08-19 DIAGNOSIS — N6310 Unspecified lump in the right breast, unspecified quadrant: Secondary | ICD-10-CM | POA: Insufficient documentation

## 2017-09-01 ENCOUNTER — Other Ambulatory Visit: Payer: Self-pay | Admitting: Family Medicine

## 2017-09-01 DIAGNOSIS — N952 Postmenopausal atrophic vaginitis: Secondary | ICD-10-CM

## 2017-09-01 DIAGNOSIS — Z780 Asymptomatic menopausal state: Secondary | ICD-10-CM

## 2017-09-01 DIAGNOSIS — R350 Frequency of micturition: Secondary | ICD-10-CM

## 2017-09-01 MED ORDER — ESTRADIOL 1 MG PO TABS: 1.0000 mg | ORAL_TABLET | ORAL | 3 refills | Status: DC

## 2017-10-10 DIAGNOSIS — R0602 Shortness of breath: Secondary | ICD-10-CM | POA: Insufficient documentation

## 2017-10-10 NOTE — Progress Notes (Signed)
Cardiology Office Note  Date:  10/12/2017   ID:  Wendy Patton, DOB 11-12-1935, MRN [id number]  PCP:  Carmon Ginsberg, [place]   Chief Complaint  Patient presents with  . Other    Sob and syncope/dizziness. Meds reviewed verbally with pt.    HPI:  [name] is a 82 year old woman with past medical history of GERD Hyperlipidemia ?MVP OA of knees migraines SOB dating back >6 years chostrochtisis previous on prednieine Dependant edema Referred by Dr. Tami Ribas for SOB on exertion  Recently seen by ear nose throat for laryngeal pharyngeal reflux, GERD at that time reported having shortness of breath, Previously seen by pulmonary in the past, felt symptoms secondary to reflux Now reports severe shortness of breath with exertion, no stridor or wheezing Referred to our office by Dr. Mali McQueen  Orthostatics done in the office today blood pressure with no change ranging in the 150 range heart rate 60-70  "tired" all of the time, more than normal 6 months of SOB Prior to that did not feel short of breath Particularly having symptoms when she climbs stairs with groceries, feels short of breath  On flat surfaces able to go longer Does not feel that this is a chronic issue only more recently Active but no regular exercise program No chest pain  Previously with leg edema, none recently Was only left leg, mild around the ankle Attributes this to a venous issue  Lab work reviewed with her in detail Total chol 280. LDL 165 Previously total chol 234  CT chest 2010 Mild Cardiomegaly accentuated by pectus excavatum deformity. Trace pericardial fluid  Images reviewed with her in detail and no carotid or aortic atherosclerosis No coronary calcifications  EKG personally reviewed by myself on todays visit Shows normal sinus rhythm rate 63 bpm right bundle branch block no significant ST-T wave changes   PMH:   has a past medical history of Anemia, Arthritis, Cancer ([place]), CKD  (chronic kidney disease), COPD (chronic obstructive pulmonary disease) (HCC), Difficult intubation, Dry skin, GERD (gastroesophageal reflux disease), Headache(784.0), Heart murmur, History of migraine, History of staph infection, IBS (irritable bowel syndrome), Joint pain, Joint swelling, Laryngopharyngeal reflux, MVP (mitral valve prolapse), Neuralgia, Neuropathy, Osteopenia, PONV (postoperative nausea and vomiting), Rheumatic fever, Shortness of breath, and Thyroid disease.  PSH:    Past Surgical History:  Procedure Laterality Date  . ABDOMINAL HYSTERECTOMY  2009  . BREAST DUCTAL SYSTEM EXCISION Right 01/11/2014   Procedure: MAJOR DUCT EXCISION RIGHT BREAST;  Surgeon: Stark Klein, MD;  Location: [facility];  Service: General;  Laterality: Right;  . BREAST EXCISIONAL BIOPSY Right 2015   neg  . BREAST EXCISIONAL BIOPSY Left 2010   benign papilloma removed  . BREAST EXCISIONAL BIOPSY Left 1975   neg  . BREAST SURGERY  1975, 2010   benign breast tumor  . breat excision  1975   benign breast tumor excision  . CATARACT EXTRACTION W/PHACO Right 07/15/2016   Procedure: CATARACT EXTRACTION PHACO AND INTRAOCULAR LENS PLACEMENT (IOC);  Surgeon: Estill Cotta, MD;  Location: [facility];  Service: Ophthalmology;  Laterality: Right;  Lot # [id number] H [place]: 01:38.6 AP%: 25.0 CDE: 46.89  . CATARACT EXTRACTION W/PHACO Left 09/02/2016   Procedure: CATARACT EXTRACTION PHACO AND INTRAOCULAR LENS PLACEMENT (IOC);  Surgeon: Estill Cotta, MD;  Location: [facility];  Service: Ophthalmology;  Laterality: Left;  [place] 2:05.3 AP% 24.9 CDE 51.84 Fluid pack lot # [id number]  . cervial polyps  1999  . [place]  . COLONOSCOPY    .  CYSTECTOMY  1962   vaginal cyst removal  . DILATION AND CURETTAGE OF UTERUS  1984  . ESOPHAGOGASTRODUODENOSCOPY    . granuloma  2002   chin granuloma removed  . [place], 2002, 2004   prosthesis, condyles implants, fossa replaced  .  SIGMOIDOSCOPY  1993  . SQUAMOUS CELL CARCINOMA EXCISION  2008   left ankle/skin graft  . [place]  . THYROIDECTOMY  2006   left  . TONSILLECTOMY AND ADENOIDECTOMY  1944  . TOTAL HIP ARTHROPLASTY Left 09/12/2013   Procedure: LEFT TOTAL HIP ARTHROPLASTY ANTERIOR APPROACH;  Surgeon: [name], MD;  Location: [place];  Service: Orthopedics;  Laterality: Left;  . TRAPEZIUM RESECTION  2000, 2004   removal right & left hand trapezium  . TUBAL LIGATION    . uterine polyps  2008  . vaginal cyst removed  1962    Current Outpatient Medications  Medication Sig Dispense Refill  . Calcium Carb-Cholecalciferol (CALCIUM 600 + D PO) Take 1 tablet by mouth 2 (two) times daily.    . carboxymethylcellulose (REFRESH PLUS) 0.5 % SOLN Place 1 drop into both eyes daily.    . Cholecalciferol (VITAMIN D) 2000 units tablet Take 2,000 Units by mouth daily.    . cycloSPORINE (RESTASIS) 0.05 % ophthalmic emulsion 1 drop 2 (two) times daily.    Marland Kitchen estradiol (ESTRACE) 1 MG tablet Take 1 tablet (1 mg total) by mouth 4 (four) times a week. 48 tablet 3  . Glucosamine HCl 1000 MG TABS Take 1,000 mg by mouth daily.    Marland Kitchen neomycin-polymyxin-hydrocortisone (CORTISPORIN) 3.5-10000-1 OTIC suspension instill 4 drops into affected ear three to four times a day  0  . pantoprazole (PROTONIX) 40 MG tablet Take 40 mg by mouth daily with supper.     . vitamin B-12 (CYANOCOBALAMIN) 1000 MCG tablet Take 1,000 mcg by mouth daily.    . vitamin E 1000 UNIT capsule Take 1,000 Units by mouth daily.       No current facility-administered medications for this visit.      Allergies:   Avelox [moxifloxacin]; Neurontin [gabapentin]; Nitrofurantoin monohyd macro; Percocet [oxycodone-acetaminophen]; Pulmicort turbuhaler [budesonide]; Amoxicillin; Chlorhexidine; Ciprofloxacin hcl; Clindamycin/lincomycin; Doxycycline; Entex; Hydrocodone; Tavist-d [albertsons dayhist-d]; Tizanidine; and Witch hazel   Social  History:  The patient  reports that  has never smoked. she has never used smokeless tobacco. She reports that she does not drink alcohol or use drugs.   Family History:   family history includes Heart attack in her maternal grandmother.    Review of Systems: Review of Systems  Constitutional: Negative.   Respiratory: Positive for shortness of breath.   Cardiovascular: Negative.   Gastrointestinal: Negative.   Musculoskeletal: Negative.   Neurological: Negative.   Psychiatric/Behavioral: Negative.   All other systems reviewed and are negative.    PHYSICAL EXAM: VS:  BP (!) 150/88 (BP Location: Left Arm, Patient Position: Sitting, Cuff Size: Normal)   Pulse 63   Ht 5\' 8"  (1.727 m)   Wt 140 lb (63.5 kg)   BMI 21.29 kg/m  , BMI Body mass index is 21.29 kg/m. GEN: Well nourished, well developed, in no acute distress  HEENT: normal  Neck: no JVD, carotid bruits, or masses Cardiac: RRR; no murmurs, rubs, or gallops,no edema  Respiratory:  clear to auscultation bilaterally, normal work of breathing GI: soft, nontender, nondistended, + BS MS: no deformity or atrophy  Skin: warm and dry, no rash Neuro:  Strength  and sensation are intact Psych: euthymic mood, full affect    Recent Labs: 11/11/2016: ALT 12; BUN 16; Creatinine, Ser 0.82; Potassium 4.9; Sodium 138    Lipid Panel Lab Results  Component Value Date   CHOL 280 (H) 11/11/2016   HDL 97 11/11/2016   LDLCALC 165 (H) 11/11/2016   TRIG 89 11/11/2016      Wt Readings from Last 3 Encounters:  10/12/17 140 lb (63.5 kg)  08/04/17 140 lb 12.8 oz (63.9 kg)  07/23/17 143 lb 9.6 oz (65.1 kg)       ASSESSMENT AND PLAN:  SOB (shortness of breath) - Plan: EKG 12-Lead, ECHOCARDIOGRAM COMPLETE Etiology unclear, History of pectus excavatum on CT scan 2010, unclear if this is contributing though less likely Mitral valve prolapse history but no murmur on exam, History of pericardial effusion seen on CT scan 2010 with no  follow-up, Blood pressure high, she reports elevated heart rate on exertion We have ordered echocardiogram for the above We did discuss stress testing on treadmill potentially to monitor heart rate and blood pressure with exertion  will wait until after echocardiogram to decide.   Mitral valve prolapse No significant murmur on exam, echocardiogram pending  Hypercholesteremia Cholesterol is markedly elevated Significant time spent discussing her CT scan 2010 showing no significant plaque anywhere I am inclined not to treat with statin as is she  Lower extremity edema Dependent edema, not a factor on today's visit  Pectus excavatum Likely asymptomatic Seen on CT scan She was not aware  Disposition:   F/U as needed We will call her with the results of her echocardiogram   Total encounter time more than 45 minutes  Greater than 50% was spent in counseling and coordination of care with the patient   Orders Placed This Encounter  Procedures  . EKG 12-Lead  . ECHOCARDIOGRAM COMPLETE     Signed, Esmond Plants, M.D., Ph.D. 10/12/2017  [place], [place]

## 2017-10-12 ENCOUNTER — Ambulatory Visit: Payer: Medicare Other | Admitting: Cardiovascular Disease

## 2017-10-12 ENCOUNTER — Encounter: Payer: Self-pay | Admitting: Cardiovascular Disease

## 2017-10-12 VITALS — BP 150/88 | HR 63 | Ht 68.0 in | Wt 140.0 lb

## 2017-10-12 DIAGNOSIS — R0602 Shortness of breath: Secondary | ICD-10-CM

## 2017-10-12 DIAGNOSIS — Q676 Pectus excavatum: Secondary | ICD-10-CM | POA: Diagnosis not present

## 2017-10-12 DIAGNOSIS — E7800 Pure hypercholesterolemia, unspecified: Secondary | ICD-10-CM | POA: Diagnosis not present

## 2017-10-12 DIAGNOSIS — R600 Localized edema: Secondary | ICD-10-CM | POA: Diagnosis not present

## 2017-10-12 DIAGNOSIS — I341 Nonrheumatic mitral (valve) prolapse: Secondary | ICD-10-CM | POA: Diagnosis not present

## 2017-10-12 NOTE — Patient Instructions (Signed)
Medication Instructions:   No medication changes made  Labwork:  No new labs needed  Testing/Procedures:  We will order an echocardiogram for shortness of breath, MVP   Follow-Up: It was a pleasure seeing you in the office today. Please call us if you have new issues that need to be addressed before your next appt.  [phone]  Your physician wants you to follow-up in:  As needed  If you need a refill on your cardiac medications before your next appointment, please call your pharmacy.  For educational health videos Log in to : [email or web address] Or : [email or web address], password : [email or web address]

## 2017-10-13 ENCOUNTER — Telehealth: Payer: Self-pay | Admitting: Family Medicine

## 2017-10-13 NOTE — Telephone Encounter (Signed)
Patient's ear is stopped up again and cant hear.  She said the episodes are becoming more frequent.   She would like to talk to [facility] regarding a referral to an ENT please.

## 2017-10-14 ENCOUNTER — Other Ambulatory Visit: Payer: Self-pay | Admitting: Family Medicine

## 2017-10-14 DIAGNOSIS — H9190 Unspecified hearing loss, unspecified ear: Secondary | ICD-10-CM

## 2017-10-14 NOTE — Telephone Encounter (Signed)
Will self -refer as already established with ENT, Dr. Tami Ribas. Will call if still needs a referral.

## 2017-10-14 NOTE — Telephone Encounter (Signed)
Patient said she called Dr. Tami Ribas to make appt.and she cant be seen till April.  She wants [place] to schedule the appt. To see if she can get in sooner please.

## 2017-10-14 NOTE — Telephone Encounter (Signed)
Referral in progress. 

## 2017-10-28 ENCOUNTER — Other Ambulatory Visit: Payer: Self-pay

## 2017-10-28 ENCOUNTER — Ambulatory Visit (INDEPENDENT_AMBULATORY_CARE_PROVIDER_SITE_OTHER): Payer: Medicare Other

## 2017-10-28 DIAGNOSIS — R0602 Shortness of breath: Secondary | ICD-10-CM | POA: Diagnosis not present

## 2017-11-01 ENCOUNTER — Telehealth: Payer: Self-pay | Admitting: *Deleted

## 2017-11-01 NOTE — Telephone Encounter (Signed)
Reviewed results with patient and she reports that her blood pressures have been elevated. She reports 140-190's over 90-100's which is not her normal which was 90-60's. Reviewed results and recommendations and she wants to see what Dr. Rockey Situ recommends given her newly high blood pressure readings. Advised I would send this over to him and would be in touch. She was appreciative for the call with no further questions.

## 2017-11-01 NOTE — Telephone Encounter (Signed)
-----   Message from Minna Merritts, MD sent at 10/31/2017 10:24 AM EDT ----- Echo Normal EF No significant valve disease to explain sob Would consider lexiscan myoview to rule out blockages

## 2017-11-05 ENCOUNTER — Other Ambulatory Visit: Payer: Self-pay

## 2017-11-05 MED ORDER — AMLODIPINE BESYLATE 5 MG PO TABS: 5.0000 mg | ORAL_TABLET | Freq: Every day | ORAL | 5 refills | Status: DC

## 2017-11-05 NOTE — Telephone Encounter (Signed)
Left message on machine for patient to contact the office.   

## 2017-11-05 NOTE — Telephone Encounter (Signed)
Patient returning call.

## 2017-11-05 NOTE — Telephone Encounter (Signed)
For blood pressure would start amlodipine 5 mg daily Would monitor blood pressure for 1 week If it continues to run high more than 140 on a regular basis after 5 days Would consider increasing amlodipine up to 10 mg daily For shortness of breath she could consider stress testing, pharmacologic study

## 2017-11-05 NOTE — Telephone Encounter (Signed)
Reviewed recommendations with patient who verbalized understanding. She will monitor SOB sx and call if it does not improve. Prescription sent to [facility] per pt request.

## 2017-11-09 ENCOUNTER — Ambulatory Visit: Payer: Medicare Other | Admitting: Family Medicine

## 2017-11-16 ENCOUNTER — Encounter: Payer: Self-pay | Admitting: Family Medicine

## 2017-11-16 ENCOUNTER — Ambulatory Visit (INDEPENDENT_AMBULATORY_CARE_PROVIDER_SITE_OTHER): Payer: Medicare Other | Admitting: Family Medicine

## 2017-11-16 VITALS — BP 122/60 | HR 82 | Temp 98.0°F | Resp 16 | Ht 69.0 in | Wt 142.4 lb

## 2017-11-16 DIAGNOSIS — M170 Bilateral primary osteoarthritis of knee: Secondary | ICD-10-CM | POA: Diagnosis not present

## 2017-11-16 DIAGNOSIS — R5383 Other fatigue: Secondary | ICD-10-CM | POA: Diagnosis not present

## 2017-11-16 DIAGNOSIS — Z0000 Encounter for general adult medical examination without abnormal findings: Secondary | ICD-10-CM

## 2017-11-16 DIAGNOSIS — I10 Essential (primary) hypertension: Secondary | ICD-10-CM

## 2017-11-16 DIAGNOSIS — N952 Postmenopausal atrophic vaginitis: Secondary | ICD-10-CM | POA: Diagnosis not present

## 2017-11-16 DIAGNOSIS — I341 Nonrheumatic mitral (valve) prolapse: Secondary | ICD-10-CM

## 2017-11-16 DIAGNOSIS — N951 Menopausal and female climacteric states: Secondary | ICD-10-CM | POA: Diagnosis not present

## 2017-11-16 DIAGNOSIS — K219 Gastro-esophageal reflux disease without esophagitis: Secondary | ICD-10-CM

## 2017-11-16 DIAGNOSIS — E7800 Pure hypercholesterolemia, unspecified: Secondary | ICD-10-CM

## 2017-11-16 DIAGNOSIS — Z8739 Personal history of other diseases of the musculoskeletal system and connective tissue: Secondary | ICD-10-CM

## 2017-11-16 DIAGNOSIS — R0602 Shortness of breath: Secondary | ICD-10-CM

## 2017-11-16 NOTE — Patient Instructions (Signed)
We will call you with the lab results. 

## 2017-11-16 NOTE — Progress Notes (Signed)
Subjective:     Patient ID: Wendy Patton, female   DOB: 15-Jan-1936, 82 y.o.   MRN: [id number] Chief Complaint  Patient presents with  . Annual Exam    Patient comes in office today for her annual physical in general patient states that she feels well. Patient reports that she follows a well balanced diet, is staying active exercising 3-4x a week and sleeping on average 9hrs night. Patient last reported TD, 01/13/16, Zoster 10/21/16 Prevnar 08/20/14, Colonoscopy 07/02/14, BMD 10/11/14, Diagnostic mammo 08/19/17.    HPI Recent negative evaluation for S.O.B. By cardiology. Was started on medication for hypertension. Current providers: Dr. Rockey Situ, cardiology: Dr. Novella Olive, orthopedics; Dr. Kellie Moor, dermatology; Dr. George Ina, Ophthalmology; Dr.Wohl, G.I.; Dr. Thereasa Parkin, dentist, Dr. Tami Ribas, ENT.  Review of Systems General: Feeling well but generally fatigued. HEENT: regular dental visits and eye exams. States she was instructed per ENT to use vinegar/alcohol drops in her ears 2 x month to keep clear. Cardiovascular: Shortness of breath GI: Heartburn controlled with medication. GERD had been trigger for shortness of breath in the past. no change in bowel habits or blood in the stool Breasts: normal mammogram in January 2019. Reports self breast exam monthly with no concerns GU: nocturia x 2-3., no change in bladder habits with occasional leakage. She has resumed Kegel exercises which help. Continues on Estradiol 4 x week for hot flushes and atrophic vaginiitis Neuro: no change in memory reported. Psychiatric: not depressed Musculoskeletal: Chronic knee and jaw pain. Normal bone density 2016-defers exam. Remains on calcium and Vitamin D supplements. Skin: ichthyosis    Objective:   Physical Exam  Constitutional: She appears well-developed and well-nourished. No distress.   Neck: no thyromegaly, tenderness or nodules, no cervical adenopathy, no carotid bruits ENT: throat poorly visualized due to  inability to open well. Ear canals patent with intact T.M.'s without inflammation. Lungs: Clear Breasts: defers exam Heart : RRR with 2/6 systolic murmur  Abd: bowel sounds present, soft, non-tender, no organomegaly Extremities: no edema .     Assessment:    1. Medicare annual wellness visit, subsequent  2. Fatigue, unspecified type - T4, free - TSH - CBC with Differential/Platelet  3. Primary osteoarthritis of both knees: per orthopedics  4. Perimenopausal atrophic vaginitis: continue estradiol  5. SOB (shortness of breath: further evaluation pending lab work  6. Menopausal hot flushes: continue estradiol  7. Gastroesophageal reflux disease without esophagitis: continue PPI  8. Mitral valve prolapse  9. Essential hypertension - Comprehensive metabolic panel  10. Hypercholesteremia - Lipid panel  11. History of osteopenia - VITAMIN D 25 Hydroxy (Vit-D Deficiency, Fractures)    Plan:    Further f/u pending lab work. Provided with rx for Shingrix vaccine.

## 2017-11-18 LAB — COMPREHENSIVE METABOLIC PANEL
ALT: 13 IU/L (ref 0–32)
AST: 19 IU/L (ref 0–40)
Albumin/Globulin Ratio: 1.7 (ref 1.2–2.2)
Albumin: 4.1 g/dL (ref 3.5–4.8)
Alkaline Phosphatase: 48 IU/L (ref 39–117)
BUN / CREAT RATIO: 23 (ref 12–28)
BUN: 17 mg/dL (ref 8–27)
Bilirubin Total: 0.4 mg/dL (ref 0.0–1.2)
CO2: 25 mmol/L (ref 20–29)
CREATININE: 0.75 mg/dL (ref 0.57–1.00)
Calcium: 9.9 mg/dL (ref 8.7–10.3)
Chloride: 101 mmol/L (ref 96–106)
GFR calc Af Amer: 88 mL/min/{1.73_m2} (ref >59)
GFR calc non Af Amer: 76 mL/min/{1.73_m2} (ref >59)
GLOBULIN, TOTAL: 2.4 g/dL (ref 1.5–4.5)
Glucose: 77 mg/dL (ref 65–99)
Potassium: 4.6 mmol/L (ref 3.5–5.2)
SODIUM: 139 mmol/L (ref 134–144)
Total Protein: 6.5 g/dL (ref 6.0–8.5)

## 2017-11-18 LAB — CBC WITH DIFFERENTIAL/PLATELET
Basophils Absolute: 0 10*3/uL (ref 0.0–0.2)
Basos: 1 %
EOS (ABSOLUTE): 0.2 10*3/uL (ref 0.0–0.4)
EOS: 4 %
HEMATOCRIT: 39.6 % (ref 34.0–46.6)
HEMOGLOBIN: 13.3 g/dL (ref 11.1–15.9)
IMMATURE GRANULOCYTES: 0 %
Immature Grans (Abs): 0 10*3/uL (ref 0.0–0.1)
LYMPHS: 31 %
Lymphocytes Absolute: 1.4 10*3/uL (ref 0.7–3.1)
MCH: 28.4 pg (ref 26.6–33.0)
MCHC: 33.6 g/dL (ref 31.5–35.7)
MCV: 85 fL (ref 79–97)
MONOCYTES: 7 %
Monocytes Absolute: 0.3 10*3/uL (ref 0.1–0.9)
Neutrophils Absolute: 2.6 10*3/uL (ref 1.4–7.0)
Neutrophils: 57 %
Platelets: 238 10*3/uL (ref 150–379)
RBC: 4.68 x10E6/uL (ref 3.77–5.28)
RDW: 14.2 % (ref 12.3–15.4)
WBC: 4.6 10*3/uL (ref 3.4–10.8)

## 2017-11-18 LAB — TSH: TSH: 3.16 u[IU]/mL (ref 0.450–4.500)

## 2017-11-18 LAB — LIPID PANEL
CHOL/HDL RATIO: 2.5 ratio (ref 0.0–4.4)
Cholesterol, Total: 234 mg/dL — ABNORMAL HIGH (ref 100–199)
HDL: 95 mg/dL (ref >39)
LDL CALC: 127 mg/dL — AB (ref 0–99)
TRIGLYCERIDES: 60 mg/dL (ref 0–149)
VLDL CHOLESTEROL CAL: 12 mg/dL (ref 5–40)

## 2017-11-18 LAB — T4, FREE: Free T4: 1.24 ng/dL (ref 0.82–1.77)

## 2017-11-18 LAB — VITAMIN D 25 HYDROXY (VIT D DEFICIENCY, FRACTURES): Vit D, 25-Hydroxy: 59.6 ng/mL (ref 30.0–100.0)

## 2017-11-22 ENCOUNTER — Telehealth: Payer: Self-pay | Admitting: Cardiovascular Disease

## 2017-11-22 NOTE — Telephone Encounter (Signed)
Pt c/o swelling: STAT is pt has developed SOB within 24 hours  1) How much weight have you gained and in what time span? No weight gain  2) If swelling, where is the swelling located? Bilateral feet  3) Are you currently taking a fluid pill? no  4) Are you currently SOB? yes  5) Do you have a log of your daily weights (if so, list)? no  6) Have you gained 3 pounds in a day or 5 pounds in a week? no  7) Have you traveled recently? no

## 2017-11-22 NOTE — Telephone Encounter (Signed)
Spoke with patient and she has had some noted swelling to her feet. She would like to know if she can decrease the amlodipine by 1/2. Reviewed that she could try that but she really needs to monitor her blood pressures as well. Instructed her to wear compression hose/socks and keep feet elevated when possible. She verbalized understanding of our conversation, agreement with plan, and had no further questions at this time.

## 2017-11-30 ENCOUNTER — Ambulatory Visit: Payer: Medicare Other | Admitting: Family Medicine

## 2017-11-30 ENCOUNTER — Encounter: Payer: Self-pay | Admitting: Family Medicine

## 2017-11-30 VITALS — BP 134/78 | HR 74 | Temp 98.2°F | Resp 14 | Ht 69.0 in | Wt 142.6 lb

## 2017-11-30 DIAGNOSIS — I10 Essential (primary) hypertension: Secondary | ICD-10-CM | POA: Diagnosis not present

## 2017-11-30 DIAGNOSIS — K219 Gastro-esophageal reflux disease without esophagitis: Secondary | ICD-10-CM

## 2017-11-30 MED ORDER — PANTOPRAZOLE SODIUM 20 MG PO TBEC: 20.0000 mg | DELAYED_RELEASE_TABLET | Freq: Two times a day (BID) | ORAL | 0 refills | Status: DC

## 2017-11-30 NOTE — Patient Instructions (Addendum)
Nurse bp checks with your bp cuff 1-2 x week. Let me know if the lower dose of Protonix works for you.

## 2017-11-30 NOTE — Progress Notes (Signed)
  Subjective:     Patient ID: Wendy Patton, female   DOB: [birth year], 82 y.o.   MRN: [id number] Chief Complaint  Patient presents with  . Follow-up  . Hypertension    Bp running high at home, currently not on any medicine due to it causing swelling   HPI States that increasing her Protonix to twice daily resolved her shortness of breath. She also is starting physical therapy per her orthopedic doctor for his knees to forestall need for TKR.  Review of Systems     Objective:   Physical Exam  Constitutional: She appears well-developed and well-nourished. No distress.       Assessment:    1. Gastroesophageal reflux disease without esophagitis: will try lower dose for control - pantoprazole (PROTONIX) 20 MG tablet; Take 1 tablet (20 mg total) by mouth 2 (two) times daily.  Dispense: 60 tablet; Refill: 0  2. Essential hypertension: normotensive off medication    Plan:    Nurse bp checks with her bp cuff for comparison. Will let me know if lower dose of Protonix keeps sx under control.

## 2017-12-08 ENCOUNTER — Telehealth: Payer: Self-pay

## 2017-12-08 ENCOUNTER — Other Ambulatory Visit: Payer: Self-pay | Admitting: Family Medicine

## 2017-12-08 ENCOUNTER — Telehealth: Payer: Self-pay | Admitting: Cardiovascular Disease

## 2017-12-08 DIAGNOSIS — K219 Gastro-esophageal reflux disease without esophagitis: Secondary | ICD-10-CM

## 2017-12-08 MED ORDER — PANTOPRAZOLE SODIUM 20 MG PO TBEC: 20.0000 mg | DELAYED_RELEASE_TABLET | Freq: Two times a day (BID) | ORAL | 5 refills | Status: DC

## 2017-12-08 NOTE — Telephone Encounter (Signed)
Pt would like a call back on advise we have her when she here last  Would like a call back  States she has some questions  Pt did not want to elaborate

## 2017-12-08 NOTE — Telephone Encounter (Signed)
Patient came in office today for nurse blood pressure check and to compare her at home cuff to our cuff in office. There was no variation between patients readings. With patients cuffou blood pressure read at 138/68 and our cuff in office was 134/68. Patient states that she has been having issues with being hoarse and having difficulty breathing. Patient states that this is related to her GERD, patient is currently taking half tablet in AM and other half in PM. Patient reports that she was previously taking a whole tablet in the PM. Patient request that we increase her Protonix to whole tablet in AM and in PM, she states that she had success before with symptoms when she was on whole table twice a day. KW

## 2017-12-08 NOTE — Telephone Encounter (Signed)
Protonix generic refilled.

## 2017-12-08 NOTE — Telephone Encounter (Signed)
Spoke with patient and she states that she went to see her primary care doctor and they determined her leg swelling was from the amlodipine. She states that when she previously called in that we had instructed her to wear compression hose/socks and elevate feet which she doesn't do because she is so active. Reviewed previous note and realized that it was not sent to provider. Apologized to her because Dr. Rockey Situ did not get to review that previous call note. She was appreciative for the call back and states that since being off the amlodipine her swelling has improved and now gone. She just wanted to let us know that she was doing better and since stopping that medication she has been good. She did mention that she has had some pulmonary and GI issues and that those are being managed. She had no further questions at this time.

## 2017-12-09 ENCOUNTER — Encounter: Payer: Self-pay | Admitting: Family Medicine

## 2018-01-04 ENCOUNTER — Encounter: Payer: Self-pay | Admitting: Family Medicine

## 2018-01-04 ENCOUNTER — Other Ambulatory Visit: Payer: Self-pay | Admitting: Family Medicine

## 2018-01-04 DIAGNOSIS — K219 Gastro-esophageal reflux disease without esophagitis: Secondary | ICD-10-CM

## 2018-01-04 MED ORDER — PANTOPRAZOLE SODIUM 40 MG PO TBEC: 40.0000 mg | DELAYED_RELEASE_TABLET | Freq: Two times a day (BID) | ORAL | 1 refills | Status: AC

## 2018-01-05 ENCOUNTER — Ambulatory Visit: Payer: Medicare Other | Admitting: Physical Therapy

## 2018-01-11 ENCOUNTER — Encounter: Payer: Self-pay | Admitting: Physical Therapy

## 2018-01-11 ENCOUNTER — Ambulatory Visit: Payer: Medicare Other | Attending: Orthopaedic Surgery | Admitting: Physical Therapy

## 2018-01-11 DIAGNOSIS — M6281 Muscle weakness (generalized): Secondary | ICD-10-CM | POA: Diagnosis present

## 2018-01-11 DIAGNOSIS — R262 Difficulty in walking, not elsewhere classified: Secondary | ICD-10-CM | POA: Diagnosis present

## 2018-01-11 NOTE — Therapy (Signed)
[facility] [address], [place], [address] Phone: [phone]   Fax:  [phone]  Physical Therapy Evaluation  Patient Details  Name: Wendy Patton MRN: [id number] Date of Birth: 1936/01/16 Referring Provider: Rush Landmark   Encounter Date: 01/11/2018  PT End of Session - 01/11/18 1140    Visit Number  1    Number of Visits  17    Date for PT Re-Evaluation  03/08/18    PT Start Time  1100    PT Stop Time  1200    PT Time Calculation (min)  60 min    Activity Tolerance  Patient tolerated treatment well    Behavior During Therapy  [facility] for tasks assessed/performed       Past Medical History:  Diagnosis Date  . Anemia    after jaw surgery was on iron for short period of time  . Arthritis   . Cancer (HCC)    squamous cell  . Difficult intubation    potential for difficult airway due to limited mouth opening  . Dry skin   . GERD (gastroesophageal reflux disease)    takes Aciphex daily  . Headache(784.0)    hx of migraines   . Heart murmur   . History of migraine    last one Aug 2014 and takes Imitrex daily as needed  . History of staph infection   . IBS (irritable bowel syndrome)   . Joint pain   . Joint swelling   . Laryngopharyngeal reflux   . MVP (mitral valve prolapse)   . Neuralgia   . Neuropathy    takes gabapentin daily  . PONV (postoperative nausea and vomiting)    TMJ surgery several times  . Rheumatic fever    as a child  . Shortness of breath    with exertion  . Thyroid disease     Past Surgical History:  Procedure Laterality Date  . ABDOMINAL HYSTERECTOMY  2009  . BREAST DUCTAL SYSTEM EXCISION Right 01/11/2014   Procedure: MAJOR DUCT EXCISION RIGHT BREAST;  Surgeon: Stark Klein, MD;  Location: [facility];  Service: General;  Laterality: Right;  . BREAST EXCISIONAL BIOPSY Right 2015   neg  . BREAST EXCISIONAL BIOPSY Left 2010   benign papilloma removed  . BREAST  EXCISIONAL BIOPSY Left 1975   neg  . BREAST SURGERY  1975, 2010   benign breast tumor  . breat excision  1975   benign breast tumor excision  . CATARACT EXTRACTION W/PHACO Right 07/15/2016   Procedure: CATARACT EXTRACTION PHACO AND INTRAOCULAR LENS PLACEMENT (IOC);  Surgeon: Estill Cotta, MD;  Location: [facility];  Service: Ophthalmology;  Laterality: Right;  Lot # [id number] H [place]: 01:38.6 AP%: 25.0 CDE: 46.89  . CATARACT EXTRACTION W/PHACO Left 09/02/2016   Procedure: CATARACT EXTRACTION PHACO AND INTRAOCULAR LENS PLACEMENT (IOC);  Surgeon: Estill Cotta, MD;  Location: [facility];  Service: Ophthalmology;  Laterality: Left;  [place] 2:05.3 AP% 24.9 CDE 51.84 Fluid pack lot # [id number]  . cervial polyps  1999  . [place]  . COLONOSCOPY    . CYSTECTOMY  1962   vaginal cyst removal  . DILATION AND CURETTAGE OF UTERUS  1984  . ESOPHAGOGASTRODUODENOSCOPY    . granuloma  2002   chin granuloma removed  . [place], 2002, 2004   prosthesis, condyles implants, fossa replaced  . SIGMOIDOSCOPY  1993  . SQUAMOUS CELL CARCINOMA EXCISION  2008   left ankle/skin graft  . [place]  . THYROIDECTOMY  2006   left  . TONSILLECTOMY AND ADENOIDECTOMY  1944  . TOTAL HIP ARTHROPLASTY Left 09/12/2013   Procedure: LEFT TOTAL HIP ARTHROPLASTY ANTERIOR APPROACH;  Surgeon: [name], MD;  Location: [facility];  Service: Orthopedics;  Laterality: Left;  . TRAPEZIUM RESECTION  2000, 2004   removal right & left hand trapezium  . TUBAL LIGATION    . uterine polyps  2008  . vaginal cyst removed  1962    There were no vitals filed for this visit.   Subjective Assessment - 01/11/18 1109    Subjective  bilat knee pain    Pertinent History  Patient is a 82 year old female that reports bilat knee pain d/t "having no cartilage behind the knee cap" that she does not want surgical intervention for. Patient reports difficulty  standing from a chair and reports she has had to modify her exercise regimen of yoga and floor exercises as bending the knees is painful. Patient reports no LOB or trouble balancing, but does admit she has had "a couple falls" over the past year, which she believes is d/t not clearing her L foot over small level changes. Patient reports she also thinks her "balance is off" d/t changes in her vestibular system d/t multiple jar sx, and she is seeing an ENT for this. Patient reports worst pain in the past week 6/10 best 0/10    Limitations  Sitting;Lifting;Standing;Walking    How long can you sit comfortably?  unlimited    How long can you stand comfortably?  unlimited    How long can you walk comfortably?  unlimited    Diagnostic tests  XRay "last month per patient report has no cartilage behind bilat patella"    Patient Stated Goals  Be able to stand from a chair without pain    Currently in Pain?  Yes    Pain Score  0-No pain    Pain Location  Knee    Pain Orientation  Right;Left    Pain Descriptors / Indicators  Sharp    Pain Type  Chronic pain    Pain Radiating Towards  none    Pain Onset  More than a month ago    Pain Frequency  Intermittent    Aggravating Factors   standing from seated postion, squatting, stair ambulation    Pain Relieving Factors  glucosamine, essential oils, heat    Effect of Pain on Daily Activities  Unable to stand from her chair in church choir, unable to complete regular exercise regmin without pain    Multiple Pain Sites  No         [facility] PT Assessment - 01/11/18 0001      Assessment   Medical Diagnosis  Bilat knee pain    Referring Provider  Rush Landmark    Onset Date/Surgical Date  08/13/13    Hand Dominance  Right    Prior Therapy  Yes      Balance Screen   Has the patient fallen in the past 6 months  Yes    How many times?  2    Has the patient had a decrease in activity level because of a fear of falling?   No    Is the patient reluctant to  leave their home because of a fear of falling?   No      Home Environment   Living Environment  Private residence    Living Arrangements  Spouse/significant other    Available Help at Discharge  Family    Type of [place] to enter    Entrance Stairs-Number of Steps  4    Entrance Stairs-Rails  Right    Home Layout  Two level    Alternate Level Stairs-Number of Steps  12    Alternate Level Stairs-Rails  Right      Prior Function   Level of Independence  Independent    Vocation  Retired    Leisure  Yoga, choir, therapy dog caretaker      Sensation   Light Touch  Appears Intact      ROM Bilat hip and knee PROM and AROM wnl bilat with soft tissue restriction of hip flexors with hip ext bilat  Strength Hip flex: 3+/5 Hip abd  :4+/5 bilat Hip add  :4+/5 bilat Hip Ext in prone: 4/5 bilat with knees at most possible flex possible before pain Hip IR: 4+/5 bilat Hip ER: 3+/5 bilat Knee flex 4/5 bilat Knee ext 5/5 bilat  Ankle DF 3+/5 bilat Ankle PF Unable to complete any SL heel raises on L and 3 full heel raises on R  Special Tests/ Other (+) Elys  (-) 90/90 bilat (-) FABER bilat (-) FADIR bilat (+) Obers bilat (-) Scour bilat (+) Thomas Test bilat BERG 48/56 with largest deficit in SLS positions with more deficit standing on LLE SLS R: 11sec L 4sec  Gait: Decreased heel strike and foot clearance on L side    Ther-Ex -Standing hip flexor stretch 30sec hold bilat; patient is unable to complete thomas stretch as the flexion at the knee is painful -Seated DF 10x with cuing for 3sec hold and eccentric lowering -SLR 10x bilat with cuing for eccentric control    . Marland Kitchen          Objective measurements completed on examination: See above findings.              PT Education - 01/11/18 1128    Education Details  Patient was educated on diagnosis, anatomy and pathology involved, prognosis, role of PT, and was given an HEP,  demonstrating exercise with proper form following verbal and tactile cues, and was given a paper hand out to continue exercise at home. Pt was educated on and agreed to plan of care.    Person(s) Educated  Patient    Methods  Explanation;Verbal cues;Tactile cues;Demonstration    Comprehension  Verbalized understanding;Returned demonstration;Verbal cues required;Tactile cues required       PT Short Term Goals - 01/11/18 1504      PT SHORT TERM GOAL #1   Title  Pt will be independent with HEP in order to improve strength and balance in order to decrease fall risk and improve function at home and work    Time  4    Period  Weeks    Status  New        PT Long Term Goals - 01/11/18 1504      PT LONG TERM GOAL #1   Title  Pt will improve BERG by at least 3 points in order to demonstrate clinically significant improvement in balance    Baseline  01/11/18 48    Time  8    Period  Weeks    Status  New      PT LONG TERM GOAL #2   Title  Pt will decrease  worst pain as reported on NPRS by at least 3 points in order to demonstrate clinically significant reduction in pain in order to transfer from sit to stand independently     Baseline  01/11/18: Worst pain 6/10 with STS transfers      Time  8    Period  Weeks    Status  New      PT LONG TERM GOAL #3   Title  Patient will demonstrate bilat 5/5 ankle DF in order to clear feet to prevent LOB and falls    Baseline  01/11/18: 3+/5 bilat with occasional L toe drag during ambulation    Time  8    Period  Weeks    Status  New             Plan - 01/11/18 2238    Clinical Impression Statement  Pt is a 82 year old female with Bilat knee DJD with current activity limitations in prolonged walking, squatting/kneeling, STS transfers, and stair negotiation secondary to impairments including B knee pain (L > R), decreased hip/knee strength, decreased postural stability/motor control, soft tissue tightness of bilat hip flexors, gait abnormalities  (decrease foot clearance L>R)and deficits in balance. The aforementioned impairments and activity limitations restrict the pt from full participation in singing in her church choir as she has increased pain with STS transfers, and in her current yoga and resistance exercise regimen.  Pt will benefit from skilled PT intervention to address the aforementioned impairments and activity limitation for best return to PLOF.    History and Personal Factors relevant to plan of care:  Hx of L hip replacement (2015); hx of falls    Clinical Presentation  Evolving    Clinical Presentation due to:  2 personal factors/comorbidities, 3 body systems/activity limitations/participation restrictions     Clinical Decision Making  Moderate    Rehab Potential  Good    Clinical Impairments Affecting Rehab Potential  (+) Active lifestyle, strong social support, motivation (-) age, chronicity of sx, pain in bilat LE, hx of falls    PT Frequency  2x / week    PT Duration  8 weeks    PT Treatment/Interventions  ADLs/Self Care Home Management;Electrical Stimulation;Therapeutic activities;Patient/family education;Therapeutic exercise;Iontophoresis 4mg /ml Dexamethasone;Aquatic Therapy;Gait training;Balance training;Passive range of motion;Neuromuscular re-education;Moist Heat;Stair training;Traction;Ultrasound;Cryotherapy;Functional mobility training;Manual techniques;Dry needling    PT Next Visit Plan  FOTO; 5xSTS, HEP review, ST techniques for hip flexors    PT Home Exercise Plan  Standing hip flexor stretch, seated DF, SLR    Consulted and Agree with Plan of Care  Patient       Patient will benefit from skilled therapeutic intervention in order to improve the following deficits and impairments:  Abnormal gait, Increased fascial restricitons, Improper body mechanics, Pain, Decreased coordination, Decreased mobility, Decreased activity tolerance, Decreased endurance, Decreased strength, Decreased range of motion, Decreased  balance, Difficulty walking, Impaired flexibility  Visit Diagnosis: Difficulty in walking, not elsewhere classified     Problem List Patient Active Problem List   Diagnosis Date Noted  . Pectus excavatum 10/12/2017  . 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 Perimenopausal atrophic vaginitis 09/22/2016  . Menopausal hot flushes 09/22/2016  . Mitral valve prolapse 01/13/2016  . Herniation of nucleus pulposus 05/01/2015  . Hypercholesteremia 05/01/2015  . Osteoarthritis of both knees 05/01/2015  . History of migraine headaches 05/01/2015  . Esophageal reflux 01/10/2015  . S/P total hip arthroplasty 09/12/2013    Shelton Silvas 01/11/2018, 10:47 PM  [facility] [address], [place], [address] Phone: [phone]   Fax:  [phone]  Name: Wendy Patton MRN: [id number] Date of Birth: 1935/10/28

## 2018-01-12 ENCOUNTER — Encounter: Payer: Medicare Other | Admitting: Physical Therapy

## 2018-01-13 ENCOUNTER — Ambulatory Visit: Payer: Medicare Other | Admitting: Physical Therapy

## 2018-01-13 ENCOUNTER — Encounter: Payer: Self-pay | Admitting: Physical Therapy

## 2018-01-13 DIAGNOSIS — R262 Difficulty in walking, not elsewhere classified: Secondary | ICD-10-CM | POA: Diagnosis not present

## 2018-01-13 NOTE — Therapy (Signed)
[facility] [address] [place], [address] Phone: [phone]   Fax:  [phone]  Physical Therapy Treatment  Patient Details  Name: Wendy Patton MRN: [id number] Date of Birth: April 24, 1936 Referring Provider: Rush Landmark   Encounter Date: 01/13/2018  PT End of Session - 01/13/18 1430    Visit Number  2    Number of Visits  17    Date for PT Re-Evaluation  03/08/18    PT Start Time  0215    PT Stop Time  0300    PT Time Calculation (min)  45 min    Activity Tolerance  Patient tolerated treatment well    Behavior During Therapy  [facility] for tasks assessed/performed       Past Medical History:  Diagnosis Date  . Anemia    after jaw surgery was on iron for short period of time  . Arthritis   . Cancer (HCC)    squamous cell  . Difficult intubation    potential for difficult airway due to limited mouth opening  . Dry skin   . GERD (gastroesophageal reflux disease)    takes Aciphex daily  . Headache(784.0)    hx of migraines   . Heart murmur   . History of migraine    last one Aug 2014 and takes Imitrex daily as needed  . History of staph infection   . IBS (irritable bowel syndrome)   . Joint pain   . Joint swelling   . Laryngopharyngeal reflux   . MVP (mitral valve prolapse)   . Neuralgia   . Neuropathy    takes gabapentin daily  . PONV (postoperative nausea and vomiting)    TMJ surgery several times  . Rheumatic fever    as a child  . Shortness of breath    with exertion  . Thyroid disease     Past Surgical History:  Procedure Laterality Date  . ABDOMINAL HYSTERECTOMY  2009  . BREAST DUCTAL SYSTEM EXCISION Right 01/11/2014   Procedure: MAJOR DUCT EXCISION RIGHT BREAST;  Surgeon: Stark Klein, MD;  [facility]: [facility];  Service: General;  Laterality: Right;  . BREAST EXCISIONAL BIOPSY Right 2015   neg  . BREAST EXCISIONAL BIOPSY Left 2010   benign papilloma removed  . BREAST  EXCISIONAL BIOPSY Left 1975   neg  . BREAST SURGERY  1975, 2010   benign breast tumor  . breat excision  1975   benign breast tumor excision  . CATARACT EXTRACTION W/PHACO Right 07/15/2016   Procedure: CATARACT EXTRACTION PHACO AND INTRAOCULAR LENS PLACEMENT (IOC);  Surgeon: Estill Cotta, MD;  Location: [facility];  Service: Ophthalmology;  Laterality: Right;  Lot # [id number] H [place]: 01:38.6 AP%: 25.0 CDE: 46.89  . CATARACT EXTRACTION W/PHACO Left 09/02/2016   Procedure: CATARACT EXTRACTION PHACO AND INTRAOCULAR LENS PLACEMENT (IOC);  Surgeon: Estill Cotta, MD;  Location: [facility];  Service: Ophthalmology;  Laterality: Left;  [place] 2:05.3 AP% 24.9 CDE 51.84 Fluid pack lot # [id number]  . cervial polyps  1999  . [place]  . COLONOSCOPY    . CYSTECTOMY  1962   vaginal cyst removal  . DILATION AND CURETTAGE OF UTERUS  1984  . ESOPHAGOGASTRODUODENOSCOPY    . granuloma  2002   chin granuloma removed  . [place], 2002, 2004   prosthesis, condyles implants, fossa replaced  . SIGMOIDOSCOPY  1993  . SQUAMOUS CELL CARCINOMA EXCISION  2008   left ankle/skin graft  . [place]  . THYROIDECTOMY  2006   left  . TONSILLECTOMY AND ADENOIDECTOMY  1944  . TOTAL HIP ARTHROPLASTY Left 09/12/2013   Procedure: LEFT TOTAL HIP ARTHROPLASTY ANTERIOR APPROACH;  Surgeon: [name], MD;  Location: [facility];  Service: Orthopedics;  Laterality: Left;  . TRAPEZIUM RESECTION  2000, 2004   removal right & left hand trapezium  . TUBAL LIGATION    . uterine polyps  2008  . vaginal cyst removed  1962    There were no vitals filed for this visit.  Subjective Assessment - 01/13/18 1427    Subjective  Patient reports she is having some pain in L knee "right on top of the knee cap on the inside of the LLE". Patient reports 4/10 pain in this knee. Patient reports no falls since eval. Reports compliance with HEP. No questions or  concerns at this time.     Pertinent History  Patient is a 82 year old female that reports bilat knee pain d/t "having no cartilage behind the knee cap" that she does not want surgical intervention for. Patient reports difficulty standing from a chair and reports she has had to modify her exercise regimen of yoga and floor exercises as bending the knees is painful. Patient reports no LOB or trouble balancing, but does admit she has had "a couple falls" over the past year, which she believes is d/t not clearing her L foot over small level changes. Patient reports she also thinks her "balance is off" d/t changes in her vestibular system d/t multiple jar sx, and she is seeing an ENT for this. Patient reports worst pain in the past week 6/10 best 0/10    Limitations  Sitting;Lifting;Standing;Walking    How long can you sit comfortably?  unlimited    How long can you stand comfortably?  unlimited    How long can you walk comfortably?  unlimited    Diagnostic tests  XRay "last month per patient report has no cartilage behind bilat patella"    Patient Stated Goals  Be able to stand from a chair without pain    Pain Onset  More than a month ago         Ther-Ex -Treadmill walking 4 min during hx intake 1.2 speed  -Seated DF 2x 10 (HEP review) with min cuing for hold at top -SLR 1x 10 each side, patient reports this is not enough challenge and that she is able to complete at home with 5lb weight  -SLR 3x 10/10/9 w/ 5# ankle weight, pt initially needing max cuing for eccentric control, with patient reporting exercise is "much more difficult with eccentric lowering". Min cuing to maintain knee ext  -Clamshell body wt 3x 10 each side, patient able to utilize 75% AROM with cuing to prevent hip rotation compensation and for eccentric control  -Seated marching 3x 10 with cuing for eccentric lowering -Thomas stretch 3x 30sec (patient reports standing stretch is painful to the knees) -Frog stretch 3x 30sec  hold                      PT Education - 01/13/18 1430    Education Details  Exercise form    Person(s) Educated  Patient    Methods  Explanation;Demonstration;Tactile cues;Verbal cues    Comprehension  Verbal cues required;Tactile cues required;Returned demonstration;Verbalized understanding       PT Short Term Goals - 01/11/18 1504  PT SHORT TERM GOAL #1   Title  Pt will be independent with HEP in order to improve strength and balance in order to decrease fall risk and improve function at home and work    Time  4    Period  Weeks    Status  New        PT Long Term Goals - 01/11/18 1504      PT LONG TERM GOAL #1   Title  Pt will improve BERG by at least 3 points in order to demonstrate clinically significant improvement in balance    Baseline  01/11/18 48    Time  8    Period  Weeks    Status  New      PT LONG TERM GOAL #2   Title  Pt will decrease worst pain as reported on NPRS by at least 3 points in order to demonstrate clinically significant reduction in pain in order to transfer from sit to stand independently     Baseline  01/11/18: Worst pain 6/10 with STS transfers      Time  8    Period  Weeks    Status  New      PT LONG TERM GOAL #3   Title  Patient will demonstrate bilat 5/5 ankle DF in order to clear feet to prevent LOB and falls    Baseline  01/11/18: 3+/5 bilat with occasional L toe drag during ambulation    Time  8    Period  Weeks    Status  New            Plan - 01/13/18 1454    Clinical Impression Statement  Patient tolerated therex progression well with no increased pain, only muscle fatigue requiring cuing for proper exercise form. PT educated pt on importance of strengthening lateral and stretching medial hip musculature for proper length/tension relationship here to prevent pain. PT utilized thomas stretch for hip flexors as patient is reporting some pain with standing stretch, PT gave patinet printout of this to add to  HEP.     Rehab Potential  Good    Clinical Impairments Affecting Rehab Potential  (+) Active lifestyle, strong social support, motivation (-) age, chronicity of sx, pain in bilat LE, hx of falls    PT Frequency  2x / week    PT Duration  8 weeks    PT Treatment/Interventions  ADLs/Self Care Home Management;Electrical Stimulation;Therapeutic activities;Patient/family education;Therapeutic exercise;Iontophoresis 4mg /ml Dexamethasone;Aquatic Therapy;Gait training;Balance training;Passive range of motion;Neuromuscular re-education;Moist Heat;Stair training;Traction;Ultrasound;Cryotherapy;Functional mobility training;Manual techniques;Dry needling    PT Next Visit Plan  FOTO; 5xSTS, HEP review, ST techniques for hip flexors    PT Home Exercise Plan  Frog stretch, thomas stretch, clamshell, seated DF, SLR    Consulted and Agree with Plan of Care  Patient       Patient will benefit from skilled therapeutic intervention in order to improve the following deficits and impairments:  Abnormal gait, Increased fascial restricitons, Improper body mechanics, Pain, Decreased coordination, Decreased mobility, Decreased activity tolerance, Decreased endurance, Decreased strength, Decreased range of motion, Decreased balance, Difficulty walking, Impaired flexibility  Visit Diagnosis: Difficulty in walking, not elsewhere classified     Problem List Patient Active Problem List   Diagnosis Date Noted  . Pectus excavatum 10/12/2017  . 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 Perimenopausal atrophic vaginitis 09/22/2016  . Menopausal hot flushes 09/22/2016  . Mitral valve prolapse 01/13/2016  . Herniation of nucleus pulposus 05/01/2015  . Hypercholesteremia 05/01/2015  .  Osteoarthritis of both knees 05/01/2015  . History of migraine headaches 05/01/2015  . Esophageal reflux 01/10/2015  . S/P total hip arthroplasty 09/12/2013   Shelton Silvas PT, DPT Shelton Silvas 01/13/2018, 2:57 PM  [facility] [address], [place], [address] Phone: [phone]   Fax:  [phone]  Name: Wendy Patton MRN: [id number] Date of Birth: 22-Jun-1936

## 2018-01-17 ENCOUNTER — Ambulatory Visit: Payer: Medicare Other | Admitting: Physical Therapy

## 2018-01-17 ENCOUNTER — Encounter: Payer: Medicare Other | Admitting: Physical Therapy

## 2018-01-17 ENCOUNTER — Encounter: Payer: Self-pay | Admitting: Physical Therapy

## 2018-01-17 DIAGNOSIS — M6281 Muscle weakness (generalized): Secondary | ICD-10-CM

## 2018-01-17 DIAGNOSIS — R262 Difficulty in walking, not elsewhere classified: Secondary | ICD-10-CM | POA: Diagnosis not present

## 2018-01-17 NOTE — Therapy (Signed)
[facility] [address] [place], [address] Phone: [phone]   Fax:  [phone]  Physical Therapy Treatment  Patient Details  Name: Wendy Patton MRN: [id number] Date of Birth: 1935-11-03 Referring Provider: Rush Landmark   Encounter Date: 01/17/2018  PT End of Session - 01/17/18 1618    Visit Number  3    Number of Visits  17    Date for PT Re-Evaluation  03/08/18    PT Start Time  0400    PT Stop Time  0415    PT Time Calculation (min)  15 min    Activity Tolerance  Patient tolerated treatment well    Behavior During Therapy  [facility] for tasks assessed/performed       Past Medical History:  Diagnosis Date  . Anemia    after jaw surgery was on iron for short period of time  . Arthritis   . Cancer (HCC)    squamous cell  . Difficult intubation    potential for difficult airway due to limited mouth opening  . Dry skin   . GERD (gastroesophageal reflux disease)    takes Aciphex daily  . Headache(784.0)    hx of migraines   . Heart murmur   . History of migraine    last one Aug 2014 and takes Imitrex daily as needed  . History of staph infection   . IBS (irritable bowel syndrome)   . Joint pain   . Joint swelling   . Laryngopharyngeal reflux   . MVP (mitral valve prolapse)   . Neuralgia   . Neuropathy    takes gabapentin daily  . PONV (postoperative nausea and vomiting)    TMJ surgery several times  . Rheumatic fever    as a child  . Shortness of breath    with exertion  . Thyroid disease     Past Surgical History:  Procedure Laterality Date  . ABDOMINAL HYSTERECTOMY  2009  . BREAST DUCTAL SYSTEM EXCISION Right 01/11/2014   Procedure: MAJOR DUCT EXCISION RIGHT BREAST;  Surgeon: Stark Klein, MD;  Location: [facility];  Service: General;  Laterality: Right;  . BREAST EXCISIONAL BIOPSY Right 2015   neg  . BREAST EXCISIONAL BIOPSY Left 2010   benign papilloma removed  . BREAST  EXCISIONAL BIOPSY Left 1975   neg  . BREAST SURGERY  1975, 2010   benign breast tumor  . breat excision  1975   benign breast tumor excision  . CATARACT EXTRACTION W/PHACO Right 07/15/2016   Procedure: CATARACT EXTRACTION PHACO AND INTRAOCULAR LENS PLACEMENT (IOC);  Surgeon: Estill Cotta, MD;  Location: [facility];  Service: Ophthalmology;  Laterality: Right;  Lot # [id number] H [place]: 01:38.6 AP%: 25.0 CDE: 46.89  . CATARACT EXTRACTION W/PHACO Left 09/02/2016   Procedure: CATARACT EXTRACTION PHACO AND INTRAOCULAR LENS PLACEMENT (IOC);  Surgeon: Estill Cotta, MD;  Location: [facility];  Service: Ophthalmology;  Laterality: Left;  [place] 2:05.3 AP% 24.9 CDE 51.84 Fluid pack lot # [id number]  . cervial polyps  1999  . [place]  . COLONOSCOPY    . CYSTECTOMY  1962   vaginal cyst removal  . DILATION AND CURETTAGE OF UTERUS  1984  . ESOPHAGOGASTRODUODENOSCOPY    . granuloma  2002   chin granuloma removed  . [place], 2002, 2004   prosthesis, condyles implants, fossa replaced  . SIGMOIDOSCOPY  1993  . SQUAMOUS CELL CARCINOMA EXCISION  2008   left ankle/skin graft  . [place]  . THYROIDECTOMY  2006   left  . TONSILLECTOMY AND ADENOIDECTOMY  1944  . TOTAL HIP ARTHROPLASTY Left 09/12/2013   Procedure: LEFT TOTAL HIP ARTHROPLASTY ANTERIOR APPROACH;  Surgeon: [name], MD;  Location: [facility];  Service: Orthopedics;  Laterality: Left;  . TRAPEZIUM RESECTION  2000, 2004   removal right & left hand trapezium  . TUBAL LIGATION    . uterine polyps  2008  . vaginal cyst removed  1962    There were no vitals filed for this visit.  Subjective Assessment - 01/17/18 1604    Subjective  Patient reports she has had minimal pain since last visit. Patient reports she is having some tightness at L groin, but that her stretching helps with this. Patient reports compliance with her HEP with no questions/concerns     Pertinent History  Patient is a 82 year old female that reports bilat knee pain d/t "having no cartilage behind the knee cap" that she does not want surgical intervention for. Patient reports difficulty standing from a chair and reports she has had to modify her exercise regimen of yoga and floor exercises as bending the knees is painful. Patient reports no LOB or trouble balancing, but does admit she has had "a couple falls" over the past year, which she believes is d/t not clearing her L foot over small level changes. Patient reports she also thinks her "balance is off" d/t changes in her vestibular system d/t multiple jar sx, and she is seeing an ENT for this. Patient reports worst pain in the past week 6/10 best 0/10    Limitations  Sitting;Lifting;Standing;Walking    How long can you sit comfortably?  unlimited    How long can you stand comfortably?  unlimited    How long can you walk comfortably?  unlimited    Diagnostic tests  XRay "last month per patient report has no cartilage behind bilat patella"    Patient Stated Goals  Be able to stand from a chair without pain    Pain Onset  More than a month ago          Ther-Ex -Treadmill walking 4 min during hx intake 1.2 speed  -Seated DF 3x 10 each with red theraband resistance from PT and min cuing for eccentric control (more difficult L>R for patient -SLR 3x 10 w/ 5# ankle weight with min cuing for eccentric control -Clamshell supine with red tband 3x 10 with min cuing needed initially for eccentric control -Standing hip abd 1x 12; 2x 10 with yellow tband with min cuing for eccentric control; more difficulty noted with L SLS during R concentric abd -Standing hip abd 1x 12;  2x 10 with yellow tband with min cuing for eccentric control and posture -SLS trials on R LE 3x 10 sec hold with hover hand support and wt shift to L 10sec hold x3                       PT Education - 01/17/18 1618    Education Details  exercise  form    Person(s) Educated  Patient    Methods  Explanation;Verbal cues    Comprehension  Verbal cues required;Verbalized understanding       PT Short Term Goals - 01/11/18 1504      PT SHORT TERM GOAL #1   Title  Pt will be independent with HEP in  order to improve strength and balance in order to decrease fall risk and improve function at home and work    Time  4    Period  Weeks    Status  New        PT Long Term Goals - 01/11/18 1504      PT LONG TERM GOAL #1   Title  Pt will improve BERG by at least 3 points in order to demonstrate clinically significant improvement in balance    Baseline  01/11/18 48    Time  8    Period  Weeks    Status  New      PT LONG TERM GOAL #2   Title  Pt will decrease worst pain as reported on NPRS by at least 3 points in order to demonstrate clinically significant reduction in pain in order to transfer from sit to stand independently     Baseline  01/11/18: Worst pain 6/10 with STS transfers      Time  8    Period  Weeks    Status  New      PT LONG TERM GOAL #3   Title  Patient will demonstrate bilat 5/5 ankle DF in order to clear feet to prevent LOB and falls    Baseline  01/11/18: 3+/5 bilat with occasional L toe drag during ambulation    Time  8    Period  Weeks    Status  New            Plan - 01/17/18 1637    Clinical Impression Statement  Patient tolerated therex progression well with no increased pain, requiring cuing for proper form and muscle activation. Patient rpeorts she "also works on her SLS, but feels as though her leg is going to give way". PT educated patient on the importance of hip strength/stabilization (especially lateral musculature) and its role on SLS stability.     Rehab Potential  Good    Clinical Impairments Affecting Rehab Potential  (+) Active lifestyle, strong social support, motivation (-) age, chronicity of sx, pain in bilat LE, hx of falls    PT Frequency  2x / week    PT Duration  8 weeks    PT  Treatment/Interventions  ADLs/Self Care Home Management;Electrical Stimulation;Therapeutic activities;Patient/family education;Therapeutic exercise;Iontophoresis 4mg /ml Dexamethasone;Aquatic Therapy;Gait training;Balance training;Passive range of motion;Neuromuscular re-education;Moist Heat;Stair training;Traction;Ultrasound;Cryotherapy;Functional mobility training;Manual techniques;Dry needling    PT Next Visit Plan  FOTO; 5xSTS, HEP review, ST techniques for hip flexors    PT Home Exercise Plan  Frog stretch, thomas stretch, clamshell, seated DF, SLR    Consulted and Agree with Plan of Care  Patient       Patient will benefit from skilled therapeutic intervention in order to improve the following deficits and impairments:  Abnormal gait, Increased fascial restricitons, Improper body mechanics, Pain, Decreased coordination, Decreased mobility, Decreased activity tolerance, Decreased endurance, Decreased strength, Decreased range of motion, Decreased balance, Difficulty walking, Impaired flexibility  Visit Diagnosis: Muscle weakness (generalized)  Difficulty in walking, not elsewhere classified     Problem List Patient Active Problem List   Diagnosis Date Noted  . Pectus excavatum 10/12/2017  . 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 Perimenopausal atrophic vaginitis 09/22/2016  . Menopausal hot flushes 09/22/2016  . Mitral valve prolapse 01/13/2016  . Herniation of nucleus pulposus 05/01/2015  . Hypercholesteremia 05/01/2015  . Osteoarthritis of both knees 05/01/2015  . History of migraine headaches 05/01/2015  . Esophageal reflux 01/10/2015  . S/P total hip arthroplasty  09/12/2013   Shelton Silvas PT, DPT Shelton Silvas 01/17/2018, 4:47 PM  [facility] [address] [place], [address] Phone: [phone]   Fax:  [phone]  Name: Wendy Patton MRN: [id number] Date of Birth: [birth year]

## 2018-01-19 ENCOUNTER — Encounter: Payer: Medicare Other | Admitting: Physical Therapy

## 2018-01-19 ENCOUNTER — Encounter: Payer: Self-pay | Admitting: Physical Therapy

## 2018-01-19 ENCOUNTER — Ambulatory Visit: Payer: Medicare Other | Admitting: Physical Therapy

## 2018-01-19 DIAGNOSIS — R262 Difficulty in walking, not elsewhere classified: Secondary | ICD-10-CM | POA: Diagnosis not present

## 2018-01-19 NOTE — Therapy (Signed)
[facility] [address], [place], [address] Phone: [phone]   Fax:  [phone]  Physical Therapy Treatment  Patient Details  Name: Wendy Patton MRN: [id number] Date of Birth: Apr 08, 1936 Referring Provider: Rush Landmark   Encounter Date: 01/19/2018    Past Medical History:  Diagnosis Date  . Anemia    after jaw surgery was on iron for short period of time  . Arthritis   . Cancer (HCC)    squamous cell  . Difficult intubation    potential for difficult airway due to limited mouth opening  . Dry skin   . GERD (gastroesophageal reflux disease)    takes Aciphex daily  . Headache(784.0)    hx of migraines   . Heart murmur   . History of migraine    last one Aug 2014 and takes Imitrex daily as needed  . History of staph infection   . IBS (irritable bowel syndrome)   . Joint pain   . Joint swelling   . Laryngopharyngeal reflux   . MVP (mitral valve prolapse)   . Neuralgia   . Neuropathy    takes gabapentin daily  . PONV (postoperative nausea and vomiting)    TMJ surgery several times  . Rheumatic fever    as a child  . Shortness of breath    with exertion  . Thyroid disease     Past Surgical History:  Procedure Laterality Date  . ABDOMINAL HYSTERECTOMY  2009  . BREAST DUCTAL SYSTEM EXCISION Right 01/11/2014   Procedure: MAJOR DUCT EXCISION RIGHT BREAST;  Surgeon: Stark Klein, MD;  Location: [facility];  Service: General;  Laterality: Right;  . BREAST EXCISIONAL BIOPSY Right 2015   neg  . BREAST EXCISIONAL BIOPSY Left 2010   benign papilloma removed  . BREAST EXCISIONAL BIOPSY Left 1975   neg  . BREAST SURGERY  1975, 2010   benign breast tumor  . breat excision  1975   benign breast tumor excision  . CATARACT EXTRACTION W/PHACO Right 07/15/2016   Procedure: CATARACT EXTRACTION PHACO AND INTRAOCULAR LENS PLACEMENT (IOC);  Surgeon: Estill Cotta, MD;  Location: [facility];   Service: Ophthalmology;  Laterality: Right;  Lot # [id number] H [place]: 01:38.6 AP%: 25.0 CDE: 46.89  . CATARACT EXTRACTION W/PHACO Left 09/02/2016   Procedure: CATARACT EXTRACTION PHACO AND INTRAOCULAR LENS PLACEMENT (IOC);  Surgeon: Estill Cotta, MD;  Location: [facility];  Service: Ophthalmology;  Laterality: Left;  [place] 2:05.3 AP% 24.9 CDE 51.84 Fluid pack lot # [id number]  . cervial polyps  1999  . [place]  . COLONOSCOPY    . CYSTECTOMY  1962   vaginal cyst removal  . DILATION AND CURETTAGE OF UTERUS  1984  . ESOPHAGOGASTRODUODENOSCOPY    . granuloma  2002   chin granuloma removed  . [place], 2002, 2004   prosthesis, condyles implants, fossa replaced  . SIGMOIDOSCOPY  1993  . SQUAMOUS CELL CARCINOMA EXCISION  2008   left ankle/skin graft  . [place]  . THYROIDECTOMY  2006   left  . TONSILLECTOMY AND ADENOIDECTOMY  1944  . TOTAL HIP ARTHROPLASTY Left 09/12/2013   Procedure: LEFT TOTAL HIP ARTHROPLASTY ANTERIOR APPROACH;  Surgeon: [name], MD;  Location: [facility];  Service: Orthopedics;  Laterality: Left;  . TRAPEZIUM RESECTION  2000, 2004   removal right & left hand trapezium  . TUBAL LIGATION    .  uterine polyps  2008  . vaginal cyst removed  1962    There were no vitals filed for this visit.  Subjective Assessment - 01/19/18 1354    Subjective  Patient reports she is having minimal pain today. Patient reports compliance with HEP with no questions or concerns.     Pertinent History  Patient is a 82 year old female that reports bilat knee pain d/t "having no cartilage behind the knee cap" that she does not want surgical intervention for. Patient reports difficulty standing from a chair and reports she has had to modify her exercise regimen of yoga and floor exercises as bending the knees is painful. Patient reports no LOB or trouble balancing, but does admit she has had "a couple falls" over  the past year, which she believes is d/t not clearing her L foot over small level changes. Patient reports she also thinks her "balance is off" d/t changes in her vestibular system d/t multiple jar sx, and she is seeing an ENT for this. Patient reports worst pain in the past week 6/10 best 0/10    Limitations  Sitting;Lifting;Standing;Walking    How long can you sit comfortably?  unlimited    How long can you stand comfortably?  unlimited    How long can you walk comfortably?  unlimited    Diagnostic tests  XRay "last month per patient report has no cartilage behind bilat patella"    Patient Stated Goals  Be able to stand from a chair without pain    Pain Onset  More than a month ago          Ther-Ex -Standing balance exercise with colored balance stones with PT calling out color for patient to tap foot on at random, requiring patient to reach with LE outside BOS with SLS. Patient demonstrates increased need for CGA/minA with SLS on LLE and utilizes high UE guard to maintain balance when pt reaches from stone to stone without foot placement back on ground. Patient is able to improve balance without assistance between trials.  -Cone walking balance activity with cuing for patient to clear foot over cone to increase time in SLS with dynamic gait tasks. PT providing CGA throughout activity for safety.  -Dynamic walking activity with random head turns R/L and up/down and with random "stop and turn" instructions. Patient is able to complete all dynamic walking tasks without LOB with decreased cadence needed to maintain balance -Mini squat activity with minimal knee flex as this causes pain to pt and max VC/TC and demo needed initially for proper form 2x 10; and on foam pad 2x 10 -Trials of SLS on flat ground 2x 10sec holds bilat; advancing to foam pad L: 3sec/9sec/14sec R: 8sec/10sec/15sec                        PT Short Term Goals - 01/11/18 1504      PT SHORT TERM GOAL #1    Title  Pt will be independent with HEP in order to improve strength and balance in order to decrease fall risk and improve function at home and work    Time  4    Period  Weeks    Status  New        PT Long Term Goals - 01/11/18 1504      PT LONG TERM GOAL #1   Title  Pt will improve BERG by at least 3 points in order to demonstrate clinically significant improvement  in balance    Baseline  01/11/18 48    Time  8    Period  Weeks    Status  New      PT LONG TERM GOAL #2   Title  Pt will decrease worst pain as reported on NPRS by at least 3 points in order to demonstrate clinically significant reduction in pain in order to transfer from sit to stand independently     Baseline  01/11/18: Worst pain 6/10 with STS transfers      Time  8    Period  Weeks    Status  New      PT LONG TERM GOAL #3   Title  Patient will demonstrate bilat 5/5 ankle DF in order to clear feet to prevent LOB and falls    Baseline  01/11/18: 3+/5 bilat with occasional L toe drag during ambulation    Time  8    Period  Weeks    Status  New              Patient will benefit from skilled therapeutic intervention in order to improve the following deficits and impairments:     Visit Diagnosis: No diagnosis found.     Problem List Patient Active Problem List   Diagnosis Date Noted  . Pectus excavatum 10/12/2017  . 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 Perimenopausal atrophic vaginitis 09/22/2016  . Menopausal hot flushes 09/22/2016  . Mitral valve prolapse 01/13/2016  . Herniation of nucleus pulposus 05/01/2015  . Hypercholesteremia 05/01/2015  . Osteoarthritis of both knees 05/01/2015  . History of migraine headaches 05/01/2015  . Esophageal reflux 01/10/2015  . S/P total hip arthroplasty 09/12/2013   Shelton Silvas PT, DPT Shelton Silvas 01/19/2018, 1:55 PM  [facility] [address] [place], [address] Phone:  [phone]   Fax:  [phone]  Name: KARMINA ZUFALL MRN: [id number] Date of Birth: 10/17/1935

## 2018-01-25 ENCOUNTER — Encounter: Payer: Self-pay | Admitting: Physical Therapy

## 2018-01-25 ENCOUNTER — Ambulatory Visit: Payer: Medicare Other | Admitting: Physical Therapy

## 2018-01-25 DIAGNOSIS — R262 Difficulty in walking, not elsewhere classified: Secondary | ICD-10-CM

## 2018-01-25 DIAGNOSIS — M6281 Muscle weakness (generalized): Secondary | ICD-10-CM

## 2018-01-25 NOTE — Therapy (Signed)
[facility] [address], [place], [address] Phone: [phone]   Fax:  [phone]  Physical Therapy Treatment  Patient Details  Name: Wendy Patton MRN: [id number] Date of Birth: [birth year] Referring Provider: Rush Landmark   Encounter Date: 01/25/2018  PT End of Session - 01/25/18 1125    Visit Number  4    Number of Visits  17    Date for PT Re-Evaluation  03/08/18    PT Start Time  1115    PT Stop Time  1200    PT Time Calculation (min)  45 min    Activity Tolerance  Patient tolerated treatment well    Behavior During Therapy  [facility] for tasks assessed/performed       Past Medical History:  Diagnosis Date  . Anemia    after jaw surgery was on iron for short period of time  . Arthritis   . Cancer (HCC)    squamous cell  . Difficult intubation    potential for difficult airway due to limited mouth opening  . Dry skin   . GERD (gastroesophageal reflux disease)    takes Aciphex daily  . Headache(784.0)    hx of migraines   . Heart murmur   . History of migraine    last one Aug 2014 and takes Imitrex daily as needed  . History of staph infection   . IBS (irritable bowel syndrome)   . Joint pain   . Joint swelling   . Laryngopharyngeal reflux   . MVP (mitral valve prolapse)   . Neuralgia   . Neuropathy    takes gabapentin daily  . PONV (postoperative nausea and vomiting)    TMJ surgery several times  . Rheumatic fever    as a child  . Shortness of breath    with exertion  . Thyroid disease     Past Surgical History:  Procedure Laterality Date  . ABDOMINAL HYSTERECTOMY  2009  . BREAST DUCTAL SYSTEM EXCISION Right 01/11/2014   Procedure: MAJOR DUCT EXCISION RIGHT BREAST;  Surgeon: Stark Klein, MD;  Location: [facility];  Service: General;  Laterality: Right;  . BREAST EXCISIONAL BIOPSY Right 2015   neg  . BREAST EXCISIONAL BIOPSY Left 2010   benign papilloma removed  . BREAST  EXCISIONAL BIOPSY Left 1975   neg  . BREAST SURGERY  1975, 2010   benign breast tumor  . breat excision  1975   benign breast tumor excision  . CATARACT EXTRACTION W/PHACO Right 07/15/2016   Procedure: CATARACT EXTRACTION PHACO AND INTRAOCULAR LENS PLACEMENT (IOC);  Surgeon: Estill Cotta, MD;  Location: [facility];  Service: Ophthalmology;  Laterality: Right;  Lot # [id number] H [place]: 01:38.6 AP%: 25.0 CDE: 46.89  . CATARACT EXTRACTION W/PHACO Left 09/02/2016   Procedure: CATARACT EXTRACTION PHACO AND INTRAOCULAR LENS PLACEMENT (IOC);  Surgeon: Estill Cotta, MD;  Location: [facility];  Service: Ophthalmology;  Laterality: Left;  [place] 2:05.3 AP% 24.9 CDE 51.84 Fluid pack lot # [id number]  . cervial polyps  1999  . [place]  . COLONOSCOPY    . CYSTECTOMY  1962   vaginal cyst removal  . DILATION AND CURETTAGE OF UTERUS  1984  . ESOPHAGOGASTRODUODENOSCOPY    . granuloma  2002   chin granuloma removed  . [place], 2002, 2004   prosthesis, condyles implants, fossa replaced  . SIGMOIDOSCOPY  1993  . SQUAMOUS CELL CARCINOMA EXCISION  2008   left ankle/skin graft  . [place]  . THYROIDECTOMY  2006   left  . TONSILLECTOMY AND ADENOIDECTOMY  1944  . TOTAL HIP ARTHROPLASTY Left 09/12/2013   Procedure: LEFT TOTAL HIP ARTHROPLASTY ANTERIOR APPROACH;  Surgeon: [name], MD;  Location: [facility];  Service: Orthopedics;  Laterality: Left;  . TRAPEZIUM RESECTION  2000, 2004   removal right & left hand trapezium  . TUBAL LIGATION    . uterine polyps  2008  . vaginal cyst removed  1962    There were no vitals filed for this visit.  Subjective Assessment - 01/25/18 1122    Subjective  Patient reports some increased bilat knee pain today up to 7/10. Patient reports she only has pain when bends her knees in WB. Patient reports she is noticing a dramatic difference in strength with HEP, especially clamshell  exercise, where she reports her L LE is "much weaker than the R"    Pertinent History  Patient is a 82 year old female that reports bilat knee pain d/t "having no cartilage behind the knee cap" that she does not want surgical intervention for. Patient reports difficulty standing from a chair and reports she has had to modify her exercise regimen of yoga and floor exercises as bending the knees is painful. Patient reports no LOB or trouble balancing, but does admit she has had "a couple falls" over the past year, which she believes is d/t not clearing her L foot over small level changes. Patient reports she also thinks her "balance is off" d/t changes in her vestibular system d/t multiple jar sx, and she is seeing an ENT for this. Patient reports worst pain in the past week 6/10 best 0/10    Limitations  Sitting;Lifting;Standing;Walking    How long can you sit comfortably?  unlimited    How long can you stand comfortably?  unlimited    How long can you walk comfortably?  unlimited    Diagnostic tests  XRay "last month per patient report has no cartilage behind bilat patella"    Patient Stated Goals  Be able to stand from a chair without pain    Pain Onset  More than a month ago          Ther-Ex -Treadmill walking 1.3 speed 4 min during hx intake (unbilled -Clamshell exercise 3x 10  -Prone hip ext with 2# ankle wts 2x 10 each side with cuing to prevent prevent rotation -SLR review -Standing thomas stretch review -Education on resistance vs stretching frequency    Manual -Thomas stretch with STM + trigger point release to rectus and adductors 22min each side -Elys stretch 3x 30sec each side -Obers stretch + hip ext 3x 30sec hold each side -Frog stretch with overpressure 30sec                    PT Education - 01/25/18 1125    Education Details  Exercise form    Person(s) Educated  Patient    Methods  Explanation;Demonstration;Tactile cues;Verbal cues    Comprehension   Verbal cues required;Tactile cues required;Returned demonstration;Verbalized understanding       PT Short Term Goals - 01/11/18 1504      PT SHORT TERM GOAL #1   Title  Pt will be independent with HEP in order to improve strength and balance in order to decrease fall risk and improve function at home and work    Time  4  Period  Weeks    Status  New        PT Long Term Goals - 01/11/18 1504      PT LONG TERM GOAL #1   Title  Pt will improve BERG by at least 3 points in order to demonstrate clinically significant improvement in balance    Baseline  01/11/18 48    Time  8    Period  Weeks    Status  New      PT LONG TERM GOAL #2   Title  Pt will decrease worst pain as reported on NPRS by at least 3 points in order to demonstrate clinically significant reduction in pain in order to transfer from sit to stand independently     Baseline  01/11/18: Worst pain 6/10 with STS transfers      Time  8    Period  Weeks    Status  New      PT LONG TERM GOAL #3   Title  Patient will demonstrate bilat 5/5 ankle DF in order to clear feet to prevent LOB and falls    Baseline  01/11/18: 3+/5 bilat with occasional L toe drag during ambulation    Time  8    Period  Weeks    Status  New            Plan - 01/25/18 1252    Clinical Impression Statement  PT utilized increased manual/stretching techniques d/t increased pain report from patient today. PT educated patient on resistance training, and completing strengthening exercises every other day (choosing 3 a day), and stretching 3x/ day. Patient verbalized understanding of all educaiton provided.     Rehab Potential  Good    Clinical Impairments Affecting Rehab Potential  (+) Active lifestyle, strong social support, motivation (-) age, chronicity of sx, pain in bilat LE, hx of falls    PT Frequency  2x / week    PT Duration  8 weeks    PT Treatment/Interventions  ADLs/Self Care Home Management;Electrical Stimulation;Therapeutic  activities;Patient/family education;Therapeutic exercise;Iontophoresis 4mg /ml Dexamethasone;Aquatic Therapy;Gait training;Balance training;Passive range of motion;Neuromuscular re-education;Moist Heat;Stair training;Traction;Ultrasound;Cryotherapy;Functional mobility training;Manual techniques;Dry needling    PT Next Visit Plan  FOTO; 5xSTS, HEP review, ST techniques for hip flexors    PT Home Exercise Plan  Frog stretch, thomas stretch, clamshell, seated DF, SLR    Consulted and Agree with Plan of Care  Patient       Patient will benefit from skilled therapeutic intervention in order to improve the following deficits and impairments:  Abnormal gait, Increased fascial restricitons, Improper body mechanics, Pain, Decreased coordination, Decreased mobility, Decreased activity tolerance, Decreased endurance, Decreased strength, Decreased range of motion, Decreased balance, Difficulty walking, Impaired flexibility  Visit Diagnosis: Difficulty in walking, not elsewhere classified  Muscle weakness (generalized)     Problem List Patient Active Problem List   Diagnosis Date Noted  . Pectus excavatum 10/12/2017  . 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 Perimenopausal atrophic vaginitis 09/22/2016  . Menopausal hot flushes 09/22/2016  . Mitral valve prolapse 01/13/2016  . Herniation of nucleus pulposus 05/01/2015  . Hypercholesteremia 05/01/2015  . Osteoarthritis of both knees 05/01/2015  . History of migraine headaches 05/01/2015  . Esophageal reflux 01/10/2015  . S/P total hip arthroplasty 09/12/2013   Shelton Silvas PT, DPT Shelton Silvas 01/25/2018, 12:57 PM  [facility] [address] [place], [address] Phone: [phone]   Fax:  [phone]  Name: Wendy Patton  MRN: [id number] Date of Birth: 1936-02-11

## 2018-01-27 ENCOUNTER — Encounter: Payer: Self-pay | Admitting: Physical Therapy

## 2018-01-27 ENCOUNTER — Ambulatory Visit: Payer: Medicare Other | Admitting: Physical Therapy

## 2018-01-27 DIAGNOSIS — R262 Difficulty in walking, not elsewhere classified: Secondary | ICD-10-CM

## 2018-01-27 DIAGNOSIS — M6281 Muscle weakness (generalized): Secondary | ICD-10-CM

## 2018-01-27 NOTE — Therapy (Signed)
[facility] [address], [place], [address] Phone: [phone]   Fax:  [phone]  Physical Therapy Treatment  Patient Details  Name: Wendy Patton MRN: [id number] Date of Birth: 1936-06-18 Referring Provider: Rush Landmark   Encounter Date: 01/27/2018    Past Medical History:  Diagnosis Date  . Anemia    after jaw surgery was on iron for short period of time  . Arthritis   . Cancer (HCC)    squamous cell  . Difficult intubation    potential for difficult airway due to limited mouth opening  . Dry skin   . GERD (gastroesophageal reflux disease)    takes Aciphex daily  . Headache(784.0)    hx of migraines   . Heart murmur   . History of migraine    last one Aug 2014 and takes Imitrex daily as needed  . History of staph infection   . IBS (irritable bowel syndrome)   . Joint pain   . Joint swelling   . Laryngopharyngeal reflux   . MVP (mitral valve prolapse)   . Neuralgia   . Neuropathy    takes gabapentin daily  . PONV (postoperative nausea and vomiting)    TMJ surgery several times  . Rheumatic fever    as a child  . Shortness of breath    with exertion  . Thyroid disease     Past Surgical History:  Procedure Laterality Date  . ABDOMINAL HYSTERECTOMY  2009  . BREAST DUCTAL SYSTEM EXCISION Right 01/11/2014   Procedure: MAJOR DUCT EXCISION RIGHT BREAST;  Surgeon: Stark Klein, MD;  [facility];  Service: General;  Laterality: Right;  . BREAST EXCISIONAL BIOPSY Right 2015   neg  . BREAST EXCISIONAL BIOPSY Left 2010   benign papilloma removed  . BREAST EXCISIONAL BIOPSY Left 1975   neg  . BREAST SURGERY  1975, 2010   benign breast tumor  . breat excision  1975   benign breast tumor excision  . CATARACT EXTRACTION W/PHACO Right 07/15/2016   Procedure: CATARACT EXTRACTION PHACO AND INTRAOCULAR LENS PLACEMENT (IOC);  Surgeon: Estill Cotta, MD;  Location: [facility];   Service: Ophthalmology;  Laterality: Right;  Lot # [id number] H [place]: 01:38.6 AP%: 25.0 CDE: 46.89  . CATARACT EXTRACTION W/PHACO Left 09/02/2016   Procedure: CATARACT EXTRACTION PHACO AND INTRAOCULAR LENS PLACEMENT (IOC);  Surgeon: Estill Cotta, MD;  Location: [facility];  Service: Ophthalmology;  Laterality: Left;  [place] 2:05.3 AP% 24.9 CDE 51.84 Fluid pack lot # [id number]  . cervial polyps  1999  . [place]  . COLONOSCOPY    . CYSTECTOMY  1962   vaginal cyst removal  . DILATION AND CURETTAGE OF UTERUS  1984  . ESOPHAGOGASTRODUODENOSCOPY    . granuloma  2002   chin granuloma removed  . [place], 2002, 2004   prosthesis, condyles implants, fossa replaced  . SIGMOIDOSCOPY  1993  . SQUAMOUS CELL CARCINOMA EXCISION  2008   left ankle/skin graft  . [place]  . THYROIDECTOMY  2006   left  . TONSILLECTOMY AND ADENOIDECTOMY  1944  . TOTAL HIP ARTHROPLASTY Left 09/12/2013   Procedure: LEFT TOTAL HIP ARTHROPLASTY ANTERIOR APPROACH;  Surgeon: [name], MD;  Location: [facility];  Service: Orthopedics;  Laterality: Left;  . TRAPEZIUM RESECTION  2000, 2004   removal right & left hand trapezium  . TUBAL LIGATION    .  uterine polyps  2008  . vaginal cyst removed  1962    There were no vitals filed for this visit.  Subjective Assessment - 01/27/18 1121    Subjective  Patient reports decreased pain from Tuesday's session. Patient reports 3/10 pain today in bilat knees. Patient reports compliance with HEP. No questions or concerns.     Pertinent History  Patient is a 82 year old female that reports bilat knee pain d/t "having no cartilage behind the knee cap" that she does not want surgical intervention for. Patient reports difficulty standing from a chair and reports she has had to modify her exercise regimen of yoga and floor exercises as bending the knees is painful. Patient reports no LOB or trouble balancing,  but does admit she has had "a couple falls" over the past year, which she believes is d/t not clearing her L foot over small level changes. Patient reports she also thinks her "balance is off" d/t changes in her vestibular system d/t multiple jar sx, and she is seeing an ENT for this. Patient reports worst pain in the past week 6/10 best 0/10    Limitations  Sitting;Lifting;Standing;Walking    How long can you sit comfortably?  unlimited    How long can you stand comfortably?  unlimited    How long can you walk comfortably?  unlimited    Diagnostic tests  XRay "last month per patient report has no cartilage behind bilat patella"    Patient Stated Goals  Be able to stand from a chair without pain    Pain Onset  More than a month ago         Ther-Ex -Treadmill walking 1.3 speed 4 min during hx intake (unbilled -Standing hip ext with yellow tband 3x 10 with cuing for 2sec hold in ext for glute contraction and eccentric control -Standing hip abd with yellow tband 3x 10 with cuing for posture and eccentric control -Bilat heel raises 10x with pt report of this exercise not being as challenging; SL heel raises 2x 10 each with min cuing for eccentric control  -Education to increase stretching duration to 45-60secs for long duration- low load stretching   Manual -Thomas stretch with STM + trigger point release to rectus and adductors 89min each side -Elys stretch 2x 30sec each side -Elys contract relax 10sex contract 20sec stretch -Obers stretch + hip ext 2x 30sec hold each side -Frog stretch with overpressure 30sec hold                          PT Short Term Goals - 01/11/18 1504      PT SHORT TERM GOAL #1   Title  Pt will be independent with HEP in order to improve strength and balance in order to decrease fall risk and improve function at home and work    Time  4    Period  Weeks    Status  New        PT Long Term Goals - 01/11/18 1504      PT LONG TERM GOAL #1    Title  Pt will improve BERG by at least 3 points in order to demonstrate clinically significant improvement in balance    Baseline  01/11/18 48    Time  8    Period  Weeks    Status  New      PT LONG TERM GOAL #2   Title  Pt will decrease worst pain  as reported on NPRS by at least 3 points in order to demonstrate clinically significant reduction in pain in order to transfer from sit to stand independently     Baseline  01/11/18: Worst pain 6/10 with STS transfers      Time  8    Period  Weeks    Status  New      PT LONG TERM GOAL #3   Title  Patient will demonstrate bilat 5/5 ankle DF in order to clear feet to prevent LOB and falls    Baseline  01/11/18: 3+/5 bilat with occasional L toe drag during ambulation    Time  8    Period  Weeks    Status  New              Patient will benefit from skilled therapeutic intervention in order to improve the following deficits and impairments:     Visit Diagnosis: No diagnosis found.     Problem List Patient Active Problem List   Diagnosis Date Noted  . Pectus excavatum 10/12/2017  . 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 Perimenopausal atrophic vaginitis 09/22/2016  . Menopausal hot flushes 09/22/2016  . Mitral valve prolapse 01/13/2016  . Herniation of nucleus pulposus 05/01/2015  . Hypercholesteremia 05/01/2015  . Osteoarthritis of both knees 05/01/2015  . History of migraine headaches 05/01/2015  . Esophageal reflux 01/10/2015  . S/P total hip arthroplasty 09/12/2013   Shelton Silvas PT, DPT Shelton Silvas 01/27/2018, 11:22 AM  [facility] [address] [place], [address] Phone: [phone]   Fax:  [phone]  Name: MARIJOSE CURINGTON MRN: [id number] Date of Birth: [birth year]

## 2018-01-31 ENCOUNTER — Encounter: Payer: Self-pay | Admitting: Physical Therapy

## 2018-01-31 ENCOUNTER — Ambulatory Visit: Payer: Medicare Other | Attending: Orthopaedic Surgery | Admitting: Physical Therapy

## 2018-01-31 DIAGNOSIS — R262 Difficulty in walking, not elsewhere classified: Secondary | ICD-10-CM | POA: Diagnosis present

## 2018-01-31 DIAGNOSIS — M6281 Muscle weakness (generalized): Secondary | ICD-10-CM | POA: Insufficient documentation

## 2018-01-31 NOTE — Therapy (Signed)
[facility] [address], [place], [address] Phone: [phone]   Fax:  [phone]  Physical Therapy Treatment  Patient Details  Name: Wendy Patton MRN: [id number] Date of Birth: 07/25/1936 Referring Provider: Rush Landmark   Encounter Date: 01/31/2018    Past Medical History:  Diagnosis Date  . Anemia    after jaw surgery was on iron for short period of time  . Arthritis   . Cancer (HCC)    squamous cell  . Difficult intubation    potential for difficult airway due to limited mouth opening  . Dry skin   . GERD (gastroesophageal reflux disease)    takes Aciphex daily  . Headache(784.0)    hx of migraines   . Heart murmur   . History of migraine    last one Aug 2014 and takes Imitrex daily as needed  . History of staph infection   . IBS (irritable bowel syndrome)   . Joint pain   . Joint swelling   . Laryngopharyngeal reflux   . MVP (mitral valve prolapse)   . Neuralgia   . Neuropathy    takes gabapentin daily  . PONV (postoperative nausea and vomiting)    TMJ surgery several times  . Rheumatic fever    as a child  . Shortness of breath    with exertion  . Thyroid disease     Past Surgical History:  Procedure Laterality Date  . ABDOMINAL HYSTERECTOMY  2009  . BREAST DUCTAL SYSTEM EXCISION Right 01/11/2014   Procedure: MAJOR DUCT EXCISION RIGHT BREAST;  Surgeon: Stark Klein, MD;  Location: [facility];  Service: General;  Laterality: Right;  . BREAST EXCISIONAL BIOPSY Right 2015   neg  . BREAST EXCISIONAL BIOPSY Left 2010   benign papilloma removed  . BREAST EXCISIONAL BIOPSY Left 1975   neg  . BREAST SURGERY  1975, 2010   benign breast tumor  . breat excision  1975   benign breast tumor excision  . CATARACT EXTRACTION W/PHACO Right 07/15/2016   Procedure: CATARACT EXTRACTION PHACO AND INTRAOCULAR LENS PLACEMENT (IOC);  Surgeon: Estill Cotta, MD;  Location: [facility];  Service:  Ophthalmology;  Laterality: Right;  Lot # [id number] H [place]: 01:38.6 AP%: 25.0 CDE: 46.89  . CATARACT EXTRACTION W/PHACO Left 09/02/2016   Procedure: CATARACT EXTRACTION PHACO AND INTRAOCULAR LENS PLACEMENT (IOC);  Surgeon: Estill Cotta, MD;  Location: [facility];  Service: Ophthalmology;  Laterality: Left;  [place] 2:05.3 AP% 24.9 CDE 51.84 Fluid pack lot # [id number]  . cervial polyps  1999  . [place]  . COLONOSCOPY    . CYSTECTOMY  1962   vaginal cyst removal  . DILATION AND CURETTAGE OF UTERUS  1984  . ESOPHAGOGASTRODUODENOSCOPY    . granuloma  2002   chin granuloma removed  . [place], 2002, 2004   prosthesis, condyles implants, fossa replaced  . SIGMOIDOSCOPY  1993  . SQUAMOUS CELL CARCINOMA EXCISION  2008   left ankle/skin graft  . [place]  . THYROIDECTOMY  2006   left  . TONSILLECTOMY AND ADENOIDECTOMY  1944  . TOTAL HIP ARTHROPLASTY Left 09/12/2013   Procedure: LEFT TOTAL HIP ARTHROPLASTY ANTERIOR APPROACH;  Surgeon: [name], MD;  Location: [facility];  Service: Orthopedics;  Laterality: Left;  . TRAPEZIUM RESECTION  2000, 2004   removal right & left hand trapezium  . TUBAL LIGATION    .  uterine polyps  2008  . vaginal cyst removed  1962    There were no vitals filed for this visit.  Subjective Assessment - 01/31/18 0824    Subjective  Patient reports she had some bilat knee pain over the weekend, but she thinks the stretches are helping, "progress is slow". Patient reports she has noticed with putting her pants on that she is able to "get her L LE up higher". Patient reports 5/10 pain in bilat knees with more pain in L knee. Reports compliance with her HEP with no questions or concerns.     Pertinent History  Patient is a 82 year old female that reports bilat knee pain d/t "having no cartilage behind the knee cap" that she does not want surgical intervention for. Patient reports difficulty  standing from a chair and reports she has had to modify her exercise regimen of yoga and floor exercises as bending the knees is painful. Patient reports no LOB or trouble balancing, but does admit she has had "a couple falls" over the past year, which she believes is d/t not clearing her L foot over small level changes. Patient reports she also thinks her "balance is off" d/t changes in her vestibular system d/t multiple jar sx, and she is seeing an ENT for this. Patient reports worst pain in the past week 6/10 best 0/10    Limitations  Sitting;Lifting;Standing;Walking    How long can you sit comfortably?  unlimited    How long can you stand comfortably?  unlimited    How long can you walk comfortably?  unlimited    Diagnostic tests  XRay "last month per patient report has no cartilage behind bilat patella"    Patient Stated Goals  Be able to stand from a chair without pain    Pain Onset  More than a month ago           Ther-Ex -Treadmill 1.3 speed during hx intake 4 min -Frog stretch 57min hold in suping -HEP review with education (see ESTIM)  Manual -Thomas stretch with STM +trigger point releaseto rectus and adductors 36min each side -Elys stretch 2x 30sec each side -Elys contract relax 10sex contract 20sec stretch -Obers stretch + hip ext 2x 30sec hold each side     ESTIM + heat pack HiVolt ESTIM 15 min at patient tolerated 240V increased to 250V through treatment at bilat hip flexor/knee extensors . Educated patient on benefits of modality, risks/benefits, and purpose of setting to decrease muscular tension as patient has OA pain with knee flexion; patient consents to treatment. With PT assessing patient tolerance throughout (decreasing intensity as needed), monitoring skin integrity, with decreased pain noted from patient. Following ESTIM +heat  patient had some remaining redness at bilat ant thigh, which she reports is normal for her skin, that 50% subsided before patient session  ended. PT educated patient to utilize ice if redness does not subside, and if she has any blistering or pain to seek medical advice. Utilized this time to review and re-inforce HEP to ensure patient understands modality treatment as the "short term" solution with long term benefits maintained through stretching.                      PT Short Term Goals - 01/11/18 1504      PT SHORT TERM GOAL #1   Title  Pt will be independent with HEP in order to improve strength and balance in order to decrease fall risk and  improve function at home and work    Time  4    Period  Weeks    Status  New        PT Long Term Goals - 01/11/18 1504      PT LONG TERM GOAL #1   Title  Pt will improve BERG by at least 3 points in order to demonstrate clinically significant improvement in balance    Baseline  01/11/18 48    Time  8    Period  Weeks    Status  New      PT LONG TERM GOAL #2   Title  Pt will decrease worst pain as reported on NPRS by at least 3 points in order to demonstrate clinically significant reduction in pain in order to transfer from sit to stand independently     Baseline  01/11/18: Worst pain 6/10 with STS transfers      Time  8    Period  Weeks    Status  New      PT LONG TERM GOAL #3   Title  Patient will demonstrate bilat 5/5 ankle DF in order to clear feet to prevent LOB and falls    Baseline  01/11/18: 3+/5 bilat with occasional L toe drag during ambulation    Time  8    Period  Weeks    Status  New              Patient will benefit from skilled therapeutic intervention in order to improve the following deficits and impairments:     Visit Diagnosis: No diagnosis found.     Problem List Patient Active Problem List   Diagnosis Date Noted  . Pectus excavatum 10/12/2017  . 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 Perimenopausal atrophic vaginitis 09/22/2016  . Menopausal hot flushes 09/22/2016  . Mitral valve prolapse 01/13/2016  . Herniation of  nucleus pulposus 05/01/2015  . Hypercholesteremia 05/01/2015  . Osteoarthritis of both knees 05/01/2015  . History of migraine headaches 05/01/2015  . Esophageal reflux 01/10/2015  . S/P total hip arthroplasty 09/12/2013   Shelton Silvas PT, DPT Shelton Silvas 01/31/2018, 8:25 AM  [facility] [address], [place], [address] Phone: [phone]   Fax:  [phone]  Name: Wendy Patton MRN: [id number] Date of Birth: April 30, 1936

## 2018-02-04 ENCOUNTER — Ambulatory Visit: Payer: Medicare Other

## 2018-02-04 DIAGNOSIS — M6281 Muscle weakness (generalized): Secondary | ICD-10-CM

## 2018-02-04 DIAGNOSIS — R262 Difficulty in walking, not elsewhere classified: Secondary | ICD-10-CM

## 2018-02-04 NOTE — Therapy (Signed)
[facility] [address] [place], [address] Phone: [phone]   Fax:  [phone]  Physical Therapy Treatment  Patient Details  Name: Wendy Patton MRN: [id number] Date of Birth: October 22, 1935 Referring Provider: Rush Landmark   Encounter Date: 02/04/2018  PT End of Session - 02/04/18 0933    Visit Number  7    Number of Visits  17    Date for PT Re-Evaluation  03/08/18    PT Start Time  0835    PT Stop Time  0915    PT Time Calculation (min)  40 min    Activity Tolerance  Patient tolerated treatment well    Behavior During Therapy  [facility] for tasks assessed/performed       Past Medical History:  Diagnosis Date  . Anemia    after jaw surgery was on iron for short period of time  . Arthritis   . Cancer (HCC)    squamous cell  . Difficult intubation    potential for difficult airway due to limited mouth opening  . Dry skin   . GERD (gastroesophageal reflux disease)    takes Aciphex daily  . Headache(784.0)    hx of migraines   . Heart murmur   . History of migraine    last one Aug 2014 and takes Imitrex daily as needed  . History of staph infection   . IBS (irritable bowel syndrome)   . Joint pain   . Joint swelling   . Laryngopharyngeal reflux   . MVP (mitral valve prolapse)   . Neuralgia   . Neuropathy    takes gabapentin daily  . PONV (postoperative nausea and vomiting)    TMJ surgery several times  . Rheumatic fever    as a child  . Shortness of breath    with exertion  . Thyroid disease     Past Surgical History:  Procedure Laterality Date  . ABDOMINAL HYSTERECTOMY  2009  . BREAST DUCTAL SYSTEM EXCISION Right 01/11/2014   Procedure: MAJOR DUCT EXCISION RIGHT BREAST;  Surgeon: Stark Klein, MD;  Location: [facility];  Service: General;  Laterality: Right;  . BREAST EXCISIONAL BIOPSY Right 2015   neg  . BREAST EXCISIONAL BIOPSY Left 2010   benign papilloma removed  . BREAST  EXCISIONAL BIOPSY Left 1975   neg  . BREAST SURGERY  1975, 2010   benign breast tumor  . breat excision  1975   benign breast tumor excision  . CATARACT EXTRACTION W/PHACO Right 07/15/2016   Procedure: CATARACT EXTRACTION PHACO AND INTRAOCULAR LENS PLACEMENT (IOC);  Surgeon: Estill Cotta, MD;  Location: [facility];  Service: Ophthalmology;  Laterality: Right;  Lot # [id number] H [place]: 01:38.6 AP%: 25.0 CDE: 46.89  . CATARACT EXTRACTION W/PHACO Left 09/02/2016   Procedure: CATARACT EXTRACTION PHACO AND INTRAOCULAR LENS PLACEMENT (IOC);  Surgeon: Estill Cotta, MD;  Location: [facility];  Service: Ophthalmology;  Laterality: Left;  [place] 2:05.3 AP% 24.9 CDE 51.84 Fluid pack lot # [id number]  . cervial polyps  1999  . [place]  . COLONOSCOPY    . CYSTECTOMY  1962   vaginal cyst removal  . DILATION AND CURETTAGE OF UTERUS  1984  . ESOPHAGOGASTRODUODENOSCOPY    . granuloma  2002   chin granuloma removed  . [place], 2002, 2004   prosthesis, condyles implants, fossa replaced  . SIGMOIDOSCOPY  1993  . SQUAMOUS CELL CARCINOMA EXCISION  2008   left ankle/skin graft  . [place]  . THYROIDECTOMY  2006   left  . TONSILLECTOMY AND ADENOIDECTOMY  1944  . TOTAL HIP ARTHROPLASTY Left 09/12/2013   Procedure: LEFT TOTAL HIP ARTHROPLASTY ANTERIOR APPROACH;  Surgeon: [name], MD;  Location: [facility];  Service: Orthopedics;  Laterality: Left;  . TRAPEZIUM RESECTION  2000, 2004   removal right & left hand trapezium  . TUBAL LIGATION    . uterine polyps  2008  . vaginal cyst removed  1962    There were no vitals filed for this visit.  Subjective Assessment - 02/04/18 0932    Subjective  Pt reports continued bilateral knee pain with steps and difficulty getting up out of chairs. She reports that she has been having some pain in her R hip with Marcello Moores stretch so she stopped performing that stretch. Otherwise she  believes that the stretches have been helpful.     Pertinent History  Patient is a 82 year old female that reports bilat knee pain d/t "having no cartilage behind the knee cap" that she does not want surgical intervention for. Patient reports difficulty standing from a chair and reports she has had to modify her exercise regimen of yoga and floor exercises as bending the knees is painful. Patient reports no LOB or trouble balancing, but does admit she has had "a couple falls" over the past year, which she believes is d/t not clearing her L foot over small level changes. Patient reports she also thinks her "balance is off" d/t changes in her vestibular system d/t multiple jar sx, and she is seeing an ENT for this. Patient reports worst pain in the past week 6/10 best 0/10    Limitations  Sitting;Lifting;Standing;Walking    How long can you sit comfortably?  unlimited    How long can you stand comfortably?  unlimited    How long can you walk comfortably?  unlimited    Diagnostic tests  XRay "last month per patient report has no cartilage behind bilat patella"    Patient Stated Goals  Be able to stand from a chair without pain    Currently in Pain?  No/denies Denies pain upon arrival         TREATMENT  Ther-Ex Treadmill 1.3 speed x 5:30 (unbilled); Frog stretch x 1 min hold in supine; Piriformis stretch 30s hold x 2 bilateral HS length assess and is WNL; Thomas stretch bilaterally x 30s hold; Attempted Ely stretch however pt reports significant anterior knee pain in this position so terminated; Palpation to quadriceps muscle is painless bilaterally; SLR x 15 bilateral; Hooklying bridges x 15 bilateral, initially painful so had pt elevated toes with improvement in pain but not full resolution. Added ball for adductor squeeze and pt reports full abolishment of pain and is able to complete without increase in pain; HEP review with education;  ESTIM HiVolt ESTIMx 15 min at patient  tolerated220 Vbilaterally for treatment. Educated patient on benefits of modality, risks/benefits, and purpose of setting to decrease muscular tension as patient has OA pain with knee flexion; patient consents to treatment with PT assessing patient tolerance intermittently (increasing/decreasing intensity as needed). Heat packs applied to bilateral quads for last 5 minutes of treatment. No increase in redness noted following removal of heat.                       PT Education - 02/04/18 0933    Education  Details  exercise form/technique    Person(s) Educated  Patient    Methods  Explanation    Comprehension  Verbalized understanding       PT Short Term Goals - 01/11/18 1504      PT SHORT TERM GOAL #1   Title  Pt will be independent with HEP in order to improve strength and balance in order to decrease fall risk and improve function at home and work    Time  4    Period  Weeks    Status  New        PT Long Term Goals - 01/11/18 1504      PT LONG TERM GOAL #1   Title  Pt will improve BERG by at least 3 points in order to demonstrate clinically significant improvement in balance    Baseline  01/11/18 48    Time  8    Period  Weeks    Status  New      PT LONG TERM GOAL #2   Title  Pt will decrease worst pain as reported on NPRS by at least 3 points in order to demonstrate clinically significant reduction in pain in order to transfer from sit to stand independently     Baseline  01/11/18: Worst pain 6/10 with STS transfers      Time  8    Period  Weeks    Status  New      PT LONG TERM GOAL #3   Title  Patient will demonstrate bilat 5/5 ankle DF in order to clear feet to prevent LOB and falls    Baseline  01/11/18: 3+/5 bilat with occasional L toe drag during ambulation    Time  8    Period  Weeks    Status  New            Plan - 02/04/18 0934    Clinical Impression Statement  PT utilized increased manual/stretching techniques as well as estim due to  reported benefit from these interventions. No increase in redness to bilateral LEs with heat although heat only utilized for 4-5 minutes during this session. PT educated patient on form/technique modification with exercises to continue strengthening in ways that do not increase her pain. Pt encouraged to continue her HEP and follow-up as scheduled.     Rehab Potential  Good    Clinical Impairments Affecting Rehab Potential  (+) Active lifestyle, strong social support, motivation (-) age, chronicity of sx, pain in bilat LE, hx of falls    PT Frequency  2x / week    PT Duration  8 weeks    PT Treatment/Interventions  ADLs/Self Care Home Management;Electrical Stimulation;Therapeutic activities;Patient/family education;Therapeutic exercise;Iontophoresis 4mg /ml Dexamethasone;Aquatic Therapy;Gait training;Balance training;Passive range of motion;Neuromuscular re-education;Moist Heat;Stair training;Traction;Ultrasound;Cryotherapy;Functional mobility training;Manual techniques;Dry needling    PT Next Visit Plan  HiVolt ESTIM + heat bilat hip add/knee ext MULITPLE LAYERS BETWEEN HEAT AND SKIN, ST techniques for hip flexors, LE strenghtening as tolerated    PT Home Exercise Plan  01/29/18: standing hip abd and ext; eval: Frog stretch, thomas stretch, clamshell, seated DF, SLR    Consulted and Agree with Plan of Care  Patient       Patient will benefit from skilled therapeutic intervention in order to improve the following deficits and impairments:  Abnormal gait, Increased fascial restricitons, Improper body mechanics, Pain, Decreased coordination, Decreased mobility, Decreased activity tolerance, Decreased endurance, Decreased strength, Decreased range of motion, Decreased balance, Difficulty walking, Impaired flexibility  Visit Diagnosis:  Difficulty in walking, not elsewhere classified  Muscle weakness (generalized)     Problem List Patient Active Problem List   Diagnosis Date Noted  . Pectus  excavatum 10/12/2017  . 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 Perimenopausal atrophic vaginitis 09/22/2016  . Menopausal hot flushes 09/22/2016  . Mitral valve prolapse 01/13/2016  . Herniation of nucleus pulposus 05/01/2015  . Hypercholesteremia 05/01/2015  . Osteoarthritis of both knees 05/01/2015  . History of migraine headaches 05/01/2015  . Esophageal reflux 01/10/2015  . S/P total hip arthroplasty 09/12/2013   Lyndel Safe Priseis Cratty PT, DPT, GCS  Marius Betts 02/04/2018, 9:44 AM  [facility] [address], [place], [address] Phone: [phone]   Fax:  [phone]  Name: NIAMH RADA MRN: [id number] Date of Birth: 08/12/1935

## 2018-02-11 ENCOUNTER — Encounter: Payer: Self-pay | Admitting: Physical Therapy

## 2018-02-11 ENCOUNTER — Ambulatory Visit: Payer: Medicare Other | Admitting: Physical Therapy

## 2018-02-11 DIAGNOSIS — M6281 Muscle weakness (generalized): Secondary | ICD-10-CM

## 2018-02-11 DIAGNOSIS — R262 Difficulty in walking, not elsewhere classified: Secondary | ICD-10-CM | POA: Diagnosis not present

## 2018-02-11 NOTE — Therapy (Signed)
[facility] [address], [place], [address] Phone: [phone]   Fax:  [phone]  Physical Therapy Treatment  Patient Details  Name: Wendy Patton MRN: [id number] Date of Birth: 22-Mar-1936 Referring Provider: Rush Landmark   Encounter Date: 02/11/2018  PT End of Session - 02/11/18 0811    Visit Number  8    Number of Visits  17    Date for PT Re-Evaluation  03/08/18    PT Start Time  0756    PT Stop Time  0835    PT Time Calculation (min)  39 min    Activity Tolerance  Patient tolerated treatment well    Behavior During Therapy  [facility] for tasks assessed/performed       Past Medical History:  Diagnosis Date  . Anemia    after jaw surgery was on iron for short period of time  . Arthritis   . Cancer (HCC)    squamous cell  . Difficult intubation    potential for difficult airway due to limited mouth opening  . Dry skin   . GERD (gastroesophageal reflux disease)    takes Aciphex daily  . Headache(784.0)    hx of migraines   . Heart murmur   . History of migraine    last one Aug 2014 and takes Imitrex daily as needed  . History of staph infection   . IBS (irritable bowel syndrome)   . Joint pain   . Joint swelling   . Laryngopharyngeal reflux   . MVP (mitral valve prolapse)   . Neuralgia   . Neuropathy    takes gabapentin daily  . PONV (postoperative nausea and vomiting)    TMJ surgery several times  . Rheumatic fever    as a child  . Shortness of breath    with exertion  . Thyroid disease     Past Surgical History:  Procedure Laterality Date  . ABDOMINAL HYSTERECTOMY  2009  . BREAST DUCTAL SYSTEM EXCISION Right 01/11/2014   Procedure: MAJOR DUCT EXCISION RIGHT BREAST;  Surgeon: Stark Klein, MD;  Location: [facility];  Service: General;  Laterality: Right;  . BREAST EXCISIONAL BIOPSY Right 2015   neg  . BREAST EXCISIONAL BIOPSY Left 2010   benign papilloma removed  . BREAST  EXCISIONAL BIOPSY Left 1975   neg  . BREAST SURGERY  1975, 2010   benign breast tumor  . breat excision  1975   benign breast tumor excision  . CATARACT EXTRACTION W/PHACO Right 07/15/2016   Procedure: CATARACT EXTRACTION PHACO AND INTRAOCULAR LENS PLACEMENT (IOC);  Surgeon: Estill Cotta, MD;  Location: [facility];  Service: Ophthalmology;  Laterality: Right;  Lot # [id number] H [place]: 01:38.6 AP%: 25.0 CDE: 46.89  . CATARACT EXTRACTION W/PHACO Left 09/02/2016   Procedure: CATARACT EXTRACTION PHACO AND INTRAOCULAR LENS PLACEMENT (IOC);  Surgeon: Estill Cotta, MD;  Location: [facility];  Service: Ophthalmology;  Laterality: Left;  [place] 2:05.3 AP% 24.9 CDE 51.84 Fluid pack lot # [id number]  . cervial polyps  1999  . [place]  . COLONOSCOPY    . CYSTECTOMY  1962   vaginal cyst removal  . DILATION AND CURETTAGE OF UTERUS  1984  . ESOPHAGOGASTRODUODENOSCOPY    . granuloma  2002   chin granuloma removed  . [place], 2002, 2004   prosthesis, condyles implants, fossa replaced  . SIGMOIDOSCOPY  1993  . SQUAMOUS CELL CARCINOMA EXCISION  2008   left ankle/skin graft  . [place]  . THYROIDECTOMY  2006   left  . TONSILLECTOMY AND ADENOIDECTOMY  1944  . TOTAL HIP ARTHROPLASTY Left 09/12/2013   Procedure: LEFT TOTAL HIP ARTHROPLASTY ANTERIOR APPROACH;  Surgeon: [name], MD;  Location: [place];  Service: Orthopedics;  Laterality: Left;  . TRAPEZIUM RESECTION  2000, 2004   removal right & left hand trapezium  . TUBAL LIGATION    . uterine polyps  2008  . vaginal cyst removed  1962    There were no vitals filed for this visit.  Subjective Assessment - 02/11/18 0802    Subjective  Pt reports she thinks she over did her thomas stretch and had some hip pain following. Patient reports 3-4/10 pain this am. Patient reports she "cannot tell" if ESTIM is helping, but she "thinks so".     Pertinent History   Patient is a 82 year old female that reports bilat knee pain d/t "having no cartilage behind the knee cap" that she does not want surgical intervention for. Patient reports difficulty standing from a chair and reports she has had to modify her exercise regimen of yoga and floor exercises as bending the knees is painful. Patient reports no LOB or trouble balancing, but does admit she has had "a couple falls" over the past year, which she believes is d/t not clearing her L foot over small level changes. Patient reports she also thinks her "balance is off" d/t changes in her vestibular system d/t multiple jar sx, and she is seeing an ENT for this. Patient reports worst pain in the past week 6/10 best 0/10    Limitations  Sitting;Lifting;Standing;Walking    How long can you sit comfortably?  unlimited    How long can you stand comfortably?  unlimited    How long can you walk comfortably?  unlimited    Diagnostic tests  XRay "last month per patient report has no cartilage behind bilat patella"    Patient Stated Goals  Be able to stand from a chair without pain    Pain Onset  More than a month ago       Ther-Ex -DF against therapist resistance for concentric and eccentric movement 3x 10 with min cuing for eccentric control needed at first with good carry over following -Clamshell exercise yellow tband 3x 10 with min cuing to prevent hip rotation; instructed to add yellow tband for HEP -Bridge with ball adductor squeeze to prevent knee pain 3x 10 with cuing for glute squeeze prior to prevent "hamstring cramp" and activate glutes, with no "hamstring cramp" following -Manual Ely's stretch 30sec each side prior and post ESTIM with noted increased range following ESTIM       ESTIM + heat pack HiVolt ESTIM 15 (heat on last 5 min) min at patient tolerated 230V increased to 235V through treatment at bilat hip flexor/knee extensors . Attempted d/t success of treatment at previous session success. With PT  assessing patient tolerance throughout (increasing intensity as needed), monitoring skin integrity (normal), with decreased pain noted from patient. Utilized time to verbally review HEP with patient, and make additions/changes to HEP (taking out SLR, reducing stretch time to 45sec hold d/t patient complaint of pain from "over stretching".  Educated patient on stretching hip flexors and purpose of reciprocol inhibition strengthening hip abd/ext.                      PT Education -  02/11/18 0811    Person(s) Educated  Patient    Methods  Explanation;Verbal cues    Comprehension  Verbalized understanding;Verbal cues required       PT Short Term Goals - 01/11/18 1504      PT SHORT TERM GOAL #1   Title  Pt will be independent with HEP in order to improve strength and balance in order to decrease fall risk and improve function at home and work    Time  4    Period  Weeks    Status  New        PT Long Term Goals - 01/11/18 1504      PT LONG TERM GOAL #1   Title  Pt will improve BERG by at least 3 points in order to demonstrate clinically significant improvement in balance    Baseline  01/11/18 48    Time  8    Period  Weeks    Status  New      PT LONG TERM GOAL #2   Title  Pt will decrease worst pain as reported on NPRS by at least 3 points in order to demonstrate clinically significant reduction in pain in order to transfer from sit to stand independently     Baseline  01/11/18: Worst pain 6/10 with STS transfers      Time  8    Period  Weeks    Status  New      PT LONG TERM GOAL #3   Title  Patient will demonstrate bilat 5/5 ankle DF in order to clear feet to prevent LOB and falls    Baseline  01/11/18: 3+/5 bilat with occasional L toe drag during ambulation    Time  8    Period  Weeks    Status  New            Plan - 02/11/18 8185    Clinical Impression Statement  Patient demonstrated increased ROM following ESTIM + heat with Elys stretch. Patient is  reporting some pain with HEP, so therapist modified HEP. PT continued to progress therex for reciprocol inhibition strengthening of hip ext/ER which patient tolerated well with no increased pain, requiring cuing for correct muscle activation and form.    Rehab Potential  Good    Clinical Impairments Affecting Rehab Potential  (+) Active lifestyle, strong social support, motivation (-) age, chronicity of sx, pain in bilat LE, hx of falls    PT Frequency  2x / week    PT Duration  8 weeks    PT Treatment/Interventions  ADLs/Self Care Home Management;Electrical Stimulation;Therapeutic activities;Patient/family education;Therapeutic exercise;Iontophoresis 4mg /ml Dexamethasone;Aquatic Therapy;Gait training;Balance training;Passive range of motion;Neuromuscular re-education;Moist Heat;Stair training;Traction;Ultrasound;Cryotherapy;Functional mobility training;Manual techniques;Dry needling    PT Next Visit Plan  HiVolt ESTIM + heat bilat hip add/knee ext MULITPLE LAYERS BETWEEN HEAT AND SKIN, ST techniques for hip flexors, LE strenghtening as tolerated    PT Home Exercise Plan  02/11/18: bridge with adductor squeeze 01/29/18: standing hip abd and ext; eval: Frog stretch, thomas stretch, clamshell, seated DF,     Consulted and Agree with Plan of Care  Patient       Patient will benefit from skilled therapeutic intervention in order to improve the following deficits and impairments:  Abnormal gait, Increased fascial restricitons, Improper body mechanics, Pain, Decreased coordination, Decreased mobility, Decreased activity tolerance, Decreased endurance, Decreased strength, Decreased range of motion, Decreased balance, Difficulty walking, Impaired flexibility  Visit Diagnosis: Difficulty in walking, not elsewhere classified  Muscle weakness (  generalized)     Problem List Patient Active Problem List   Diagnosis Date Noted  . Pectus excavatum 10/12/2017  . 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  Perimenopausal atrophic vaginitis 09/22/2016  . Menopausal hot flushes 09/22/2016  . Mitral valve prolapse 01/13/2016  . Herniation of nucleus pulposus 05/01/2015  . Hypercholesteremia 05/01/2015  . Osteoarthritis of both knees 05/01/2015  . History of migraine headaches 05/01/2015  . Esophageal reflux 01/10/2015  . S/P total hip arthroplasty 09/12/2013   Shelton Silvas PT, DPT Shelton Silvas 02/11/2018, 10:37 AM  [facility] [address], [place], [address] Phone: [phone]   Fax:  [phone]  Name: Wendy Patton MRN: [id number] Date of Birth: 1936-05-23

## 2018-02-15 ENCOUNTER — Ambulatory Visit: Payer: Medicare Other | Admitting: Physical Therapy

## 2018-02-22 ENCOUNTER — Encounter: Payer: Self-pay | Admitting: Family Medicine

## 2018-02-22 ENCOUNTER — Ambulatory Visit: Payer: Medicare Other | Admitting: Family Medicine

## 2018-02-22 VITALS — BP 130/82 | HR 70 | Temp 97.9°F | Resp 16 | Wt 144.2 lb

## 2018-02-22 DIAGNOSIS — J069 Acute upper respiratory infection, unspecified: Secondary | ICD-10-CM | POA: Diagnosis not present

## 2018-02-22 MED ORDER — CEFDINIR 300 MG PO CAPS: 300.0000 mg | ORAL_CAPSULE | Freq: Two times a day (BID) | ORAL | 0 refills | Status: DC

## 2018-02-22 NOTE — Patient Instructions (Signed)
Add Claritin and see if that will help dry up your drainage along with Sudafed PE. If sinuses not improving over the next two days or your don't feel better, start the antibiotic.

## 2018-02-22 NOTE — Progress Notes (Signed)
  Subjective:     Patient ID: Wendy Patton, female   DOB: 1935/12/23, 82 y.o.   MRN: [id number] Chief Complaint  Patient presents with  . Sore Throat    Patient comes in office today with concerns of sore throat, sinus pain and pressure and cough in PM. Patient states that she has been using a nettie pot for sinus relief.    HPI Reports persistent sx x one week. Describes clear to slightly yellow sinus congestion with PND and accompanying cough. Has added Delsym for cough which helps.  Review of Systems     Objective:   Physical Exam  Constitutional: She appears well-developed and well-nourished. No distress.  Ears: T.M's not well seen due to cerumen Throat: tonsils not well seen due to limited oral exucursion Neck: Tender left anterior cervical node Lungs: clear     Assessment:    1. URI, acute    Plan:    Discussed use of otc medication. Start cefdinir abx if sinuses not improving over the next two days.

## 2018-02-23 ENCOUNTER — Ambulatory Visit: Payer: Medicare Other | Admitting: Physical Therapy

## 2018-02-25 ENCOUNTER — Ambulatory Visit: Payer: Medicare Other | Admitting: Physical Therapy

## 2018-02-28 ENCOUNTER — Ambulatory Visit: Payer: Medicare Other | Admitting: Physical Therapy

## 2018-02-28 ENCOUNTER — Encounter: Payer: Self-pay | Admitting: Physical Therapy

## 2018-02-28 DIAGNOSIS — R262 Difficulty in walking, not elsewhere classified: Secondary | ICD-10-CM

## 2018-02-28 NOTE — Therapy (Signed)
[facility] [address], [place], [address] Phone: [phone]   Fax:  [phone]  Physical Therapy Treatment  Patient Details  Name: Wendy Patton MRN: [id number] Date of Birth: 12/18/1935 Referring Provider: Rush Landmark   Encounter Date: 02/28/2018  PT End of Session - 02/28/18 0818    Visit Number  9    Number of Visits  17    Date for PT Re-Evaluation  03/08/18    PT Start Time  0817    PT Stop Time  0900    PT Time Calculation (min)  43 min    Activity Tolerance  Patient tolerated treatment well    Behavior During Therapy  [facility] for tasks assessed/performed       Past Medical History:  Diagnosis Date  . Anemia    after jaw surgery was on iron for short period of time  . Arthritis   . Cancer (HCC)    squamous cell  . Difficult intubation    potential for difficult airway due to limited mouth opening  . Dry skin   . GERD (gastroesophageal reflux disease)    takes Aciphex daily  . Headache(784.0)    hx of migraines   . Heart murmur   . History of migraine    last one Aug 2014 and takes Imitrex daily as needed  . History of staph infection   . IBS (irritable bowel syndrome)   . Joint pain   . Joint swelling   . Laryngopharyngeal reflux   . MVP (mitral valve prolapse)   . Neuralgia   . Neuropathy    takes gabapentin daily  . PONV (postoperative nausea and vomiting)    TMJ surgery several times  . Rheumatic fever    as a child  . Shortness of breath    with exertion  . Thyroid disease     Past Surgical History:  Procedure Laterality Date  . ABDOMINAL HYSTERECTOMY  2009  . BREAST DUCTAL SYSTEM EXCISION Right 01/11/2014   Procedure: MAJOR DUCT EXCISION RIGHT BREAST;  Surgeon: Stark Klein, MD;  Location: [facility];  Service: General;  Laterality: Right;  . BREAST EXCISIONAL BIOPSY Right 2015   neg  . BREAST EXCISIONAL BIOPSY Left 2010   benign papilloma removed  . BREAST  EXCISIONAL BIOPSY Left 1975   neg  . BREAST SURGERY  1975, 2010   benign breast tumor  . breat excision  1975   benign breast tumor excision  . CATARACT EXTRACTION W/PHACO Right 07/15/2016   Procedure: CATARACT EXTRACTION PHACO AND INTRAOCULAR LENS PLACEMENT (IOC);  Surgeon: Estill Cotta, MD;  Location: [facility];  Service: Ophthalmology;  Laterality: Right;  Lot # [id number] H [place]: 01:38.6 AP%: 25.0 CDE: 46.89  . CATARACT EXTRACTION W/PHACO Left 09/02/2016   Procedure: CATARACT EXTRACTION PHACO AND INTRAOCULAR LENS PLACEMENT (IOC);  Surgeon: Estill Cotta, MD;  Location: [facility];  Service: Ophthalmology;  Laterality: Left;  [place] 2:05.3 AP% 24.9 CDE 51.84 Fluid pack lot # [id number]  . cervial polyps  1999  . [place]  . COLONOSCOPY    . CYSTECTOMY  1962   vaginal cyst removal  . DILATION AND CURETTAGE OF UTERUS  1984  . ESOPHAGOGASTRODUODENOSCOPY    . granuloma  2002   chin granuloma removed  . [place], 2002, 2004   prosthesis, condyles implants, fossa replaced  . SIGMOIDOSCOPY  1993  . SQUAMOUS CELL CARCINOMA EXCISION  2008   left ankle/skin graft  . [place]  . THYROIDECTOMY  2006   left  . TONSILLECTOMY AND ADENOIDECTOMY  1944  . TOTAL HIP ARTHROPLASTY Left 09/12/2013   Procedure: LEFT TOTAL HIP ARTHROPLASTY ANTERIOR APPROACH;  Surgeon: [name], MD;  Location: [place];  Service: Orthopedics;  Laterality: Left;  . TRAPEZIUM RESECTION  2000, 2004   removal right & left hand trapezium  . TUBAL LIGATION    . uterine polyps  2008  . vaginal cyst removed  1962    There were no vitals filed for this visit.  Subjective Assessment - 02/28/18 0819    Subjective  Pt reports she is "sure she has gone backwards" with increased tightness following "not being able to stretch as much d/t illness". Patient reports her pain is less severe with standing/sitting and stair negotiation with only  reported 3/10 pain with these acitivities. No queestions or concerns at this time.     Pertinent History  Patient is a 82 year old female that reports bilat knee pain d/t "having no cartilage behind the knee cap" that she does not want surgical intervention for. Patient reports difficulty standing from a chair and reports she has had to modify her exercise regimen of yoga and floor exercises as bending the knees is painful. Patient reports no LOB or trouble balancing, but does admit she has had "a couple falls" over the past year, which she believes is d/t not clearing her L foot over small level changes. Patient reports she also thinks her "balance is off" d/t changes in her vestibular system d/t multiple jar sx, and she is seeing an ENT for this. Patient reports worst pain in the past week 6/10 best 0/10    Limitations  Sitting;Lifting;Standing;Walking    How long can you sit comfortably?  unlimited    How long can you stand comfortably?  unlimited    How long can you walk comfortably?  unlimited    Diagnostic tests  XRay "last month per patient report has no cartilage behind bilat patella"    Patient Stated Goals  Be able to stand from a chair without pain    Pain Onset  More than a month ago            Ther-Ex -Treadmill walking 1.97mph with cuing for erect posture to prevent forward trunk lean/unactive glutes 4 min -During ESTIM, DF against therapist resistance for concentric and eccentric movement 3x 10 with min cuing for eccentric control, noted decreased strength on L side -Prone hip ext 3x 10 with min cuing for eccentric control   Manual -Manual Ely's stretch 30sec each side prior and post ESTIM with noted increased range following ESTIM -Ely's contract relax 10sec contract 20sec relax x3 each side -Manual Thomas stretch 30sec hold 2x each side with STM and trigger point release to bilat quads following in thomas stretch position  ESTIM+ heat packHiVolt ESTIM15 (heat on last 5  min) min at patient tolerated200Vincreased to225V through treatmentat bilat knee extensors. Attempted d/t success of treatment at previous session success. With PT assessing patient tolerance throughout (increasing intensity as needed), monitoring skin integrity (normal), with decreased pain noted from patient. Utilized time to educate patient on glute activation with proper posture and complete DF therex                     PT Education - 02/28/18 0824    Education Details  Exercise from  Person(s) Educated  Patient    Methods  Explanation;Verbal cues    Comprehension  Verbalized understanding;Verbal cues required       PT Short Term Goals - 01/11/18 1504      PT SHORT TERM GOAL #1   Title  Pt will be independent with HEP in order to improve strength and balance in order to decrease fall risk and improve function at home and work    Time  4    Period  Weeks    Status  New        PT Long Term Goals - 01/11/18 1504      PT LONG TERM GOAL #1   Title  Pt will improve BERG by at least 3 points in order to demonstrate clinically significant improvement in balance    Baseline  01/11/18 48    Time  8    Period  Weeks    Status  New      PT LONG TERM GOAL #2   Title  Pt will decrease worst pain as reported on NPRS by at least 3 points in order to demonstrate clinically significant reduction in pain in order to transfer from sit to stand independently     Baseline  01/11/18: Worst pain 6/10 with STS transfers      Time  8    Period  Weeks    Status  New      PT LONG TERM GOAL #3   Title  Patient will demonstrate bilat 5/5 ankle DF in order to clear feet to prevent LOB and falls    Baseline  01/11/18: 3+/5 bilat with occasional L toe drag during ambulation    Time  8    Period  Weeks    Status  New            Plan - 02/28/18 0737    Clinical Impression Statement  Patient with some increased tightness at bilat hip flexors/knee ext today d/t illness over  the past week. Noted tension relief with manual + modality techniques with increased ROM. Patient able to complete all therex with targetted muscle fatigue and verbalized understanding of all education provided.     Rehab Potential  Good    Clinical Impairments Affecting Rehab Potential  (+) Active lifestyle, strong social support, motivation (-) age, chronicity of sx, pain in bilat LE, hx of falls    PT Frequency  2x / week    PT Duration  8 weeks    PT Treatment/Interventions  ADLs/Self Care Home Management;Electrical Stimulation;Therapeutic activities;Patient/family education;Therapeutic exercise;Iontophoresis 4mg /ml Dexamethasone;Aquatic Therapy;Gait training;Balance training;Passive range of motion;Neuromuscular re-education;Moist Heat;Stair training;Traction;Ultrasound;Cryotherapy;Functional mobility training;Manual techniques;Dry needling    PT Next Visit Plan  HiVolt ESTIM + heat bilat hip add/knee ext MULITPLE LAYERS BETWEEN HEAT AND SKIN, ST techniques for hip flexors, LE strenghtening as tolerated    PT Home Exercise Plan  02/11/18: bridge with adductor squeeze 01/29/18: standing hip abd and ext; eval: Frog stretch, thomas stretch, clamshell, seated DF,     Consulted and Agree with Plan of Care  Patient       Patient will benefit from skilled therapeutic intervention in order to improve the following deficits and impairments:  Abnormal gait, Increased fascial restricitons, Improper body mechanics, Pain, Decreased coordination, Decreased mobility, Decreased activity tolerance, Decreased endurance, Decreased strength, Decreased range of motion, Decreased balance, Difficulty walking, Impaired flexibility  Visit Diagnosis: Difficulty in walking, not elsewhere classified     Problem List Patient Active Problem List  Diagnosis Date Noted  . Pectus excavatum 10/12/2017  . 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 Perimenopausal atrophic vaginitis 09/22/2016  . Menopausal hot flushes 09/22/2016   . Mitral valve prolapse 01/13/2016  . Herniation of nucleus pulposus 05/01/2015  . Hypercholesteremia 05/01/2015  . Osteoarthritis of both knees 05/01/2015  . History of migraine headaches 05/01/2015  . Esophageal reflux 01/10/2015  . S/P total hip arthroplasty 09/12/2013   Shelton Silvas PT, DPT Shelton Silvas 02/28/2018, 9:04 AM  [facility] [address], [place], [address] Phone: [phone]   Fax:  [phone]  Name: Wendy Patton MRN: [id number] Date of Birth: [birth year]

## 2018-03-02 ENCOUNTER — Ambulatory Visit: Payer: Medicare Other | Admitting: Physical Therapy

## 2018-03-02 ENCOUNTER — Encounter: Payer: Self-pay | Admitting: Physical Therapy

## 2018-03-02 DIAGNOSIS — R262 Difficulty in walking, not elsewhere classified: Secondary | ICD-10-CM | POA: Diagnosis not present

## 2018-03-02 NOTE — Therapy (Signed)
[facility] [address] [place], [address] Phone: [phone]   Fax:  [phone]  Physical Therapy Treatment  Patient Details  Name: Wendy Patton MRN: [id number] Date of Birth: 10-30-1935 Referring Provider: Rush Landmark   Encounter Date: 03/02/2018  PT End of Session - 03/02/18 0909    Visit Number  10    Number of Visits  17    Date for PT Re-Evaluation  03/08/18    PT Start Time  0900    PT Stop Time  0945    PT Time Calculation (min)  45 min    Activity Tolerance  Patient tolerated treatment well    Behavior During Therapy  [facility] for tasks assessed/performed       Past Medical History:  Diagnosis Date  . Anemia    after jaw surgery was on iron for short period of time  . Arthritis   . Cancer (HCC)    squamous cell  . Difficult intubation    potential for difficult airway due to limited mouth opening  . Dry skin   . GERD (gastroesophageal reflux disease)    takes Aciphex daily  . Headache(784.0)    hx of migraines   . Heart murmur   . History of migraine    last one Aug 2014 and takes Imitrex daily as needed  . History of staph infection   . IBS (irritable bowel syndrome)   . Joint pain   . Joint swelling   . Laryngopharyngeal reflux   . MVP (mitral valve prolapse)   . Neuralgia   . Neuropathy    takes gabapentin daily  . PONV (postoperative nausea and vomiting)    TMJ surgery several times  . Rheumatic fever    as a child  . Shortness of breath    with exertion  . Thyroid disease     Past Surgical History:  Procedure Laterality Date  . ABDOMINAL HYSTERECTOMY  2009  . BREAST DUCTAL SYSTEM EXCISION Right 01/11/2014   Procedure: MAJOR DUCT EXCISION RIGHT BREAST;  Surgeon: Stark Klein, MD;  Location: [facility];  Service: General;  Laterality: Right;  . BREAST EXCISIONAL BIOPSY Right 2015   neg  . BREAST EXCISIONAL BIOPSY Left 2010   benign papilloma removed  . BREAST  EXCISIONAL BIOPSY Left 1975   neg  . BREAST SURGERY  1975, 2010   benign breast tumor  . breat excision  1975   benign breast tumor excision  . CATARACT EXTRACTION W/PHACO Right 07/15/2016   Procedure: CATARACT EXTRACTION PHACO AND INTRAOCULAR LENS PLACEMENT (IOC);  Surgeon: Estill Cotta, MD;  Location: [facility];  Service: Ophthalmology;  Laterality: Right;  Lot # [id number] H [place]: 01:38.6 AP%: 25.0 CDE: 46.89  . CATARACT EXTRACTION W/PHACO Left 09/02/2016   Procedure: CATARACT EXTRACTION PHACO AND INTRAOCULAR LENS PLACEMENT (IOC);  Surgeon: Estill Cotta, MD;  Location: [facility];  Service: Ophthalmology;  Laterality: Left;  [place] 2:05.3 AP% 24.9 CDE 51.84 Fluid pack lot # [id number]  . cervial polyps  1999  . [place]  . COLONOSCOPY    . CYSTECTOMY  1962   vaginal cyst removal  . DILATION AND CURETTAGE OF UTERUS  1984  . ESOPHAGOGASTRODUODENOSCOPY    . granuloma  2002   chin granuloma removed  . [place], 2002, 2004   prosthesis, condyles implants, fossa replaced  . SIGMOIDOSCOPY  1993  . SQUAMOUS CELL CARCINOMA EXCISION  2008   left ankle/skin graft  . [place]  . THYROIDECTOMY  2006   left  . TONSILLECTOMY AND ADENOIDECTOMY  1944  . TOTAL HIP ARTHROPLASTY Left 09/12/2013   Procedure: LEFT TOTAL HIP ARTHROPLASTY ANTERIOR APPROACH;  Surgeon: [name], MD;  Location: [place] [facility];  Service: Orthopedics;  Laterality: Left;  . TRAPEZIUM RESECTION  2000, 2004   removal right & left hand trapezium  . TUBAL LIGATION    . uterine polyps  2008  . vaginal cyst removed  1962    There were no vitals filed for this visit.  Subjective Assessment - 03/02/18 0905    Subjective  Patient reports decreased knee pain since she started PT. Patient reports 2/10 pain with STS, and reports this is the only thing she is having pain with. Patient reports she is compliant with HEP with no questions/concerns at this  time.     Pertinent History  Patient is a 82 year old female that reports bilat knee pain d/t "having no cartilage behind the knee cap" that she does not want surgical intervention for. Patient reports difficulty standing from a chair and reports she has had to modify her exercise regimen of yoga and floor exercises as bending the knees is painful. Patient reports no LOB or trouble balancing, but does admit she has had "a couple falls" over the past year, which she believes is d/t not clearing her L foot over small level changes. Patient reports she also thinks her "balance is off" d/t changes in her vestibular system d/t multiple jar sx, and she is seeing an ENT for this. Patient reports worst pain in the past week 6/10 best 0/10    Limitations  Sitting;Lifting;Standing;Walking    How long can you sit comfortably?  unlimited    How long can you stand comfortably?  unlimited    How long can you walk comfortably?  unlimited    Diagnostic tests  XRay "last month per patient report has no cartilage behind bilat patella"    Patient Stated Goals  Be able to stand from a chair without pain    Pain Onset  More than a month ago         Ther-Ex -Treadmill walking 1.71mph with cuing for erect posture to prevent forward trunk lean/unactive glutes 4 min -Mini squats from very elevated mat table 3x 8 with max cuing and demo required for proper movement with glute activation with good carry over noted following. Patient reports pain with this exercise at first, PT continued to raise mat table until mat is raised appropriately -Standing hip abd w/ yellow 3x 10each with min cuing for eccentric control w/ 2 finger UE support for balance  -During ESTIM, DF against therapist resistance for concentric and eccentric movement 3x 10 with min cuing for eccentric control, noted decreased strength on L side  Manual -Manual Ely's stretch 30sec each side prior and post ESTIM with noted increased range following  ESTIM -Ely's contract relax 10sec contract 20sec relax x3 each side   ESTIM+ heat packHiVolt ESTIM15 (heat on last 5 min)min at patient tolerated250Vincreased to255V through treatmentat bilatknee extensors. Attempted d/t success of treatment at previous session success. With PT assessing patient tolerance throughout (increasing intensity as needed), monitoring skin integrity (normal), with decreased pain noted from patient. Utilized time to educate patient on purpose of completing STS with glute activation to reduce "knee strain" and encouraged patient to continue completing transfer this way to allow for increased  motor response to practice and complete DF therex                        PT Education - 03/02/18 0916    Education Details  STS form with glute activation    Person(s) Educated  Patient    Methods  Explanation;Verbal cues;Tactile cues    Comprehension  Verbalized understanding;Verbal cues required;Tactile cues required       PT Short Term Goals - 01/11/18 1504      PT SHORT TERM GOAL #1   Title  Pt will be independent with HEP in order to improve strength and balance in order to decrease fall risk and improve function at home and work    Time  4    Period  Weeks    Status  New        PT Long Term Goals - 01/11/18 1504      PT LONG TERM GOAL #1   Title  Pt will improve BERG by at least 3 points in order to demonstrate clinically significant improvement in balance    Baseline  01/11/18 48    Time  8    Period  Weeks    Status  New      PT LONG TERM GOAL #2   Title  Pt will decrease worst pain as reported on NPRS by at least 3 points in order to demonstrate clinically significant reduction in pain in order to transfer from sit to stand independently     Baseline  01/11/18: Worst pain 6/10 with STS transfers      Time  8    Period  Weeks    Status  New      PT LONG TERM GOAL #3   Title  Patient will demonstrate bilat 5/5 ankle DF in  order to clear feet to prevent LOB and falls    Baseline  01/11/18: 3+/5 bilat with occasional L toe drag during ambulation    Time  8    Period  Weeks    Status  New            Plan - 03/02/18 0932    Clinical Impression Statement  PT continued to utilize ESTIM + heat to reduce soft tissue tension as patient is continuing to report less pain overall. PT progressed therex to include STS from elevated seat as this is patient cheif c/o pain at this point. Patient required max cuing for proper form and glute activation, which she was able to carry over with 75% accuracy. PT encouraged patinet o continue practicing this form with STS transfers. Patient verbalized understanding of all education provided.     Rehab Potential  Good    Clinical Impairments Affecting Rehab Potential  (+) Active lifestyle, strong social support, motivation (-) age, chronicity of sx, pain in bilat LE, hx of falls    PT Frequency  2x / week    PT Duration  8 weeks    PT Treatment/Interventions  ADLs/Self Care Home Management;Electrical Stimulation;Therapeutic activities;Patient/family education;Therapeutic exercise;Iontophoresis 4mg /ml Dexamethasone;Aquatic Therapy;Gait training;Balance training;Passive range of motion;Neuromuscular re-education;Moist Heat;Stair training;Traction;Ultrasound;Cryotherapy;Functional mobility training;Manual techniques;Dry needling    PT Next Visit Plan  REASSESS; HiVolt ESTIM + heat bilat hip add/knee ext MULITPLE LAYERS BETWEEN HEAT AND SKIN, ST techniques for hip flexors, LE strenghtening as tolerated    PT Home Exercise Plan  02/11/18: bridge with adductor squeeze 01/29/18: standing hip abd and ext; eval: Frog stretch, thomas stretch, clamshell, seated DF,  Consulted and Agree with Plan of Care  Patient       Patient will benefit from skilled therapeutic intervention in order to improve the following deficits and impairments:  Abnormal gait, Increased fascial restricitons, Improper  body mechanics, Pain, Decreased coordination, Decreased mobility, Decreased activity tolerance, Decreased endurance, Decreased strength, Decreased range of motion, Decreased balance, Difficulty walking, Impaired flexibility  Visit Diagnosis: Difficulty in walking, not elsewhere classified     Problem List Patient Active Problem List   Diagnosis Date Noted  . Pectus excavatum 10/12/2017  . 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 Perimenopausal atrophic vaginitis 09/22/2016  . Menopausal hot flushes 09/22/2016  . Mitral valve prolapse 01/13/2016  . Herniation of nucleus pulposus 05/01/2015  . Hypercholesteremia 05/01/2015  . Osteoarthritis of both knees 05/01/2015  . History of migraine headaches 05/01/2015  . Esophageal reflux 01/10/2015  . S/P total hip arthroplasty 09/12/2013   Shelton Silvas PT, DPT Shelton Silvas 03/02/2018, 9:40 AM  [facility] [address], [place], [address] Phone: [phone]   Fax:  [phone]  Name: Wendy Patton MRN: [id number] Date of Birth: Sep 11, 1935

## 2018-03-04 ENCOUNTER — Ambulatory Visit: Payer: Medicare Other | Admitting: Physical Therapy

## 2018-03-07 ENCOUNTER — Ambulatory Visit: Payer: Medicare Other | Attending: Orthopaedic Surgery | Admitting: Physical Therapy

## 2018-03-07 ENCOUNTER — Encounter: Payer: Self-pay | Admitting: Physical Therapy

## 2018-03-07 DIAGNOSIS — R262 Difficulty in walking, not elsewhere classified: Secondary | ICD-10-CM | POA: Insufficient documentation

## 2018-03-07 NOTE — Therapy (Signed)
[facility] [address], [place], [address] Phone: [phone]   Fax:  [phone]  Physical Therapy Treatment/ DC Summary  Patient Details  Name: Wendy Patton MRN: [id number] Date of Birth: January 06, 1936 Referring Provider: Rush Landmark   Encounter Date: 03/07/2018  PT End of Session - 03/07/18 0939    Visit Number  11    Number of Visits  17    Date for PT Re-Evaluation  03/08/18    PT Start Time  0900    PT Stop Time  0945    PT Time Calculation (min)  45 min    Activity Tolerance  Patient tolerated treatment well    Behavior During Therapy  [facility] for tasks assessed/performed       Past Medical History:  Diagnosis Date  . Anemia    after jaw surgery was on iron for short period of time  . Arthritis   . Cancer (HCC)    squamous cell  . Difficult intubation    potential for difficult airway due to limited mouth opening  . Dry skin   . GERD (gastroesophageal reflux disease)    takes Aciphex daily  . Headache(784.0)    hx of migraines   . Heart murmur   . History of migraine    last one Aug 2014 and takes Imitrex daily as needed  . History of staph infection   . IBS (irritable bowel syndrome)   . Joint pain   . Joint swelling   . Laryngopharyngeal reflux   . MVP (mitral valve prolapse)   . Neuralgia   . Neuropathy    takes gabapentin daily  . PONV (postoperative nausea and vomiting)    TMJ surgery several times  . Rheumatic fever    as a child  . Shortness of breath    with exertion  . Thyroid disease     Past Surgical History:  Procedure Laterality Date  . ABDOMINAL HYSTERECTOMY  2009  . BREAST DUCTAL SYSTEM EXCISION Right 01/11/2014   Procedure: MAJOR DUCT EXCISION RIGHT BREAST;  Surgeon: Stark Klein, MD;  Location: [facility];  Service: General;  Laterality: Right;  . BREAST EXCISIONAL BIOPSY Right 2015   neg  . BREAST EXCISIONAL BIOPSY Left 2010   benign papilloma removed  .  BREAST EXCISIONAL BIOPSY Left 1975   neg  . BREAST SURGERY  1975, 2010   benign breast tumor  . breat excision  1975   benign breast tumor excision  . CATARACT EXTRACTION W/PHACO Right 07/15/2016   Procedure: CATARACT EXTRACTION PHACO AND INTRAOCULAR LENS PLACEMENT (IOC);  Surgeon: Estill Cotta, MD;  Location: [facility];  Service: Ophthalmology;  Laterality: Right;  Lot # [id number] H [place]: 01:38.6 AP%: 25.0 CDE: 46.89  . CATARACT EXTRACTION W/PHACO Left 09/02/2016   Procedure: CATARACT EXTRACTION PHACO AND INTRAOCULAR LENS PLACEMENT (IOC);  Surgeon: Estill Cotta, MD;  Location: [facility];  Service: Ophthalmology;  Laterality: Left;  [place] 2:05.3 AP% 24.9 CDE 51.84 Fluid pack lot # [id number]  . cervial polyps  1999  . [place]  . COLONOSCOPY    . CYSTECTOMY  1962   vaginal cyst removal  . DILATION AND CURETTAGE OF UTERUS  1984  . ESOPHAGOGASTRODUODENOSCOPY    . granuloma  2002   chin granuloma removed  . [place], 2002, 2004   prosthesis, condyles implants, fossa replaced  . SIGMOIDOSCOPY  1993  . SQUAMOUS CELL CARCINOMA  EXCISION  2008   left ankle/skin graft  . [place]  . THYROIDECTOMY  2006   left  . TONSILLECTOMY AND ADENOIDECTOMY  1944  . TOTAL HIP ARTHROPLASTY Left 09/12/2013   Procedure: LEFT TOTAL HIP ARTHROPLASTY ANTERIOR APPROACH;  Surgeon: [name], MD;  Location: [facility];  Service: Orthopedics;  Laterality: Left;  . TRAPEZIUM RESECTION  2000, 2004   removal right & left hand trapezium  . TUBAL LIGATION    . uterine polyps  2008  . vaginal cyst removed  1962    There were no vitals filed for this visit.  Subjective Assessment - 03/07/18 0907    Subjective  Patient continues to have pain with transitional movements. Patient reports 6/10 pain today, as she has "gotten back into doing her exercises after being sick". Patient reports compliance with HEP with no questions or  concerns.     Pertinent History  Patient is a 82 year old female that reports bilat knee pain d/t "having no cartilage behind the knee cap" that she does not want surgical intervention for. Patient reports difficulty standing from a chair and reports she has had to modify her exercise regimen of yoga and floor exercises as bending the knees is painful. Patient reports no LOB or trouble balancing, but does admit she has had "a couple falls" over the past year, which she believes is d/t not clearing her L foot over small level changes. Patient reports she also thinks her "balance is off" d/t changes in her vestibular system d/t multiple jar sx, and she is seeing an ENT for this. Patient reports worst pain in the past week 6/10 best 0/10    Limitations  Sitting;Lifting;Standing;Walking    How long can you sit comfortably?  unlimited    How long can you stand comfortably?  unlimited    How long can you walk comfortably?  unlimited    Diagnostic tests  XRay "last month per patient report has no cartilage behind bilat patella"    Patient Stated Goals  Be able to stand from a chair without pain    Pain Onset  More than a month ago           Ther-Ex -Treadmill walking 1.95mph with cuing for erect posture to prevent forward trunk lean/unactive glutes 4 min -Mini squats with countertop for balance 2x 10 with min cuing for proper form with good carry over; increased pain noted from pt -Led patient through BERG balance assessment with correction of STS form with cuing for safety and slow descent; patient able to demonstrate good carry over, and remaining deficits noted only in this and SLS on LLE  -HEP review with mini squat addition and posterior strengthening education (purpose) with education on strengthening and stretching frequency, duration, rep/set range, form -During ESTIM,DF against therapist resistance for concentric and eccentric movement 3x 10 with min cuing for eccentric control, noted decreased  strength on L side   ESTIM+ heat packHiVolt ESTIM15 (heat on last 5 min)min at patient tolerated245Vincreased to255V through treatmentat bilatknee extensors. Attempted d/t success of treatment at previous session success. With PT assessing patient tolerance throughout (increasing intensity as needed), monitoring skin integrity (normal), with decreased pain noted from patient. Utilized time to complete DF therex with band resistance and answer lingering DC questions                        PT Education - 03/07/18 0350  Education Details  DC recommendations    Person(s) Educated  Patient    Methods  Explanation;Demonstration;Handout    Comprehension  Verbalized understanding;Returned demonstration       PT Short Term Goals - 03/07/18 0909      PT SHORT TERM GOAL #1   Title  Pt will be independent with HEP in order to improve strength and balance in order to decrease fall risk and improve function at home and work    Time  4    Period  Weeks    Status  Achieved        PT Long Term Goals - 03/07/18 0910      PT LONG TERM GOAL #1   Title  Pt will improve BERG by at least 3 points in order to demonstrate clinically significant improvement in balance    Baseline  03/07/18 51/56     Time  8    Period  Weeks    Status  Achieved      PT LONG TERM GOAL #2   Title  Pt will decrease worst pain as reported on NPRS by at least 3 points in order to demonstrate clinically significant reduction in pain in order to transfer from sit to stand independently     Baseline  03/07/18 6/10 following illness    Time  8    Period  Weeks    Status  On-going      PT LONG TERM GOAL #3   Title  Patient will demonstrate bilat 5/5 ankle DF in order to clear feet to prevent LOB and falls    Baseline  01/11/18: R: 4+/5 L 4/5 with no toe drag    Time  8    Period  Weeks    Status  Achieved      PT LONG TERM GOAL #4   Title  --    Baseline  --    Time  --    Period  --     Status  --      PT LONG TERM GOAL #5   Title  --    Baseline  --    Time  --    Period  --    Status  --            Plan - 03/07/18 0926    Clinical Impression Statement  Pt reports that after recieving her bill for PT services, that she can no longer continue PT in any capacity other than HEP. Pt is encouraged that she feels as though her balance has improved with less "stubbles" since PT and believes as though she can continue strengthening at home to increase ability to perform STS transfer. PT agrees with pts assessment, as she has increased her BERG score, and normalized gait to reduce stumbles/falls. PT encouraged patient to continue    Rehab Potential  Good    Clinical Impairments Affecting Rehab Potential  (+) Active lifestyle, strong social support, motivation (-) age, chronicity of sx, pain in bilat LE, hx of falls    PT Frequency  2x / week    PT Duration  8 weeks    PT Treatment/Interventions  ADLs/Self Care Home Management;Electrical Stimulation;Therapeutic activities;Patient/family education;Therapeutic exercise;Iontophoresis 4mg /ml Dexamethasone;Aquatic Therapy;Gait training;Balance training;Passive range of motion;Neuromuscular re-education;Moist Heat;Stair training;Traction;Ultrasound;Cryotherapy;Functional mobility training;Manual techniques;Dry needling    PT Next Visit Plan  REASSESS; HiVolt ESTIM + heat bilat hip add/knee ext MULITPLE LAYERS BETWEEN HEAT AND SKIN, ST techniques for hip flexors, LE strenghtening as tolerated  PT Home Exercise Plan  03/07/18: seated DF with red band resistance, SLS, mini squats 02/11/18: bridge with adductor squeeze 01/29/18: standing hip abd and ext; eval: Frog stretch, thomas stretch, clamshell, ,     Consulted and Agree with Plan of Care  Patient       Patient will benefit from skilled therapeutic intervention in order to improve the following deficits and impairments:  Abnormal gait, Increased fascial restricitons, Improper body  mechanics, Pain, Decreased coordination, Decreased mobility, Decreased activity tolerance, Decreased endurance, Decreased strength, Decreased range of motion, Decreased balance, Difficulty walking, Impaired flexibility  Visit Diagnosis: Difficulty in walking, not elsewhere classified     Problem List Patient Active Problem List   Diagnosis Date Noted  . Pectus excavatum 10/12/2017  . 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 Perimenopausal atrophic vaginitis 09/22/2016  . Menopausal hot flushes 09/22/2016  . Mitral valve prolapse 01/13/2016  . Herniation of nucleus pulposus 05/01/2015  . Hypercholesteremia 05/01/2015  . Osteoarthritis of both knees 05/01/2015  . History of migraine headaches 05/01/2015  . Esophageal reflux 01/10/2015  . S/P total hip arthroplasty 09/12/2013   Shelton Silvas PT, DPT Shelton Silvas 03/07/2018, 10:16 AM  [facility] [address] [place], [address] Phone: [phone]   Fax:  [phone]  Name: GAVRIELLE STRECK MRN: [id number] Date of Birth: [birth year]

## 2018-03-07 NOTE — Therapy (Signed)
[facility] [address] [place], [address] Phone: [phone]   Fax:  [phone]  Patient Details  Name: Wendy Patton MRN: [id number] Date of Birth: [birth year] Referring Provider:  Melrose Nakayama, MD  Encounter Date: 03/07/2018   Shelton Silvas 03/07/2018, 9:09 AM  [facility] [address] [place], [address] Phone: [phone]   Fax:  [phone]

## 2018-03-09 ENCOUNTER — Ambulatory Visit: Payer: Medicare Other | Admitting: Physical Therapy

## 2018-03-09 ENCOUNTER — Encounter: Payer: Self-pay | Admitting: Family Medicine

## 2018-03-09 ENCOUNTER — Ambulatory Visit: Payer: Medicare Other | Admitting: Family Medicine

## 2018-03-09 VITALS — BP 158/76 | HR 80 | Temp 97.8°F | Resp 16 | Wt 141.0 lb

## 2018-03-09 DIAGNOSIS — I10 Essential (primary) hypertension: Secondary | ICD-10-CM | POA: Diagnosis not present

## 2018-03-09 DIAGNOSIS — T7840XA Allergy, unspecified, initial encounter: Secondary | ICD-10-CM

## 2018-03-09 MED ORDER — AMLODIPINE BESYLATE 2.5 MG PO TABS: 2.5000 mg | ORAL_TABLET | Freq: Every day | ORAL | 2 refills | Status: DC

## 2018-03-09 MED ORDER — PREDNISONE 10 MG PO TABS: ORAL_TABLET | ORAL | 0 refills | Status: AC

## 2018-03-09 NOTE — Progress Notes (Signed)
Patient: Wendy Patton Female    DOB: Aug 31, 1935   82 y.o.   MRN: [id number] Visit Date: 03/09/2018  Today's Provider: Lelon Huh, MD   Chief Complaint  Patient presents with  . Rash   Subjective:    Rash  This is a new problem. The current episode started in the past 7 days (3 days). The problem has been gradually worsening since onset. The affected locations include the scalp, left arm and right arm. The rash is characterized by redness, scaling, itchiness and dryness. Pertinent negatives include no anorexia, congestion, cough, diarrhea, eye pain, facial edema, fatigue, fever, joint pain, nail changes, rhinorrhea, shortness of breath, sore throat or vomiting. Treatments tried: Benadryl and Zyrtec. The treatment provided no relief.    Patient was started on cefdinir 02/22/2018 for URI, which she states was a sinus infection. Patient stated she completed antibiotic 3 days ago and broke out with rash that day. Rash is on scalp and both arms. Rash is itchy, red, and scaly. Patient states she has taken otc benadryl and Zyrtec with no relief.   She also has hypertension and reports she stopped taking amlodipine a few months ago due to swelling in her legs. She states BP has mostly been in the 130s since then.    Allergies  Allergen Reactions  . Avelox [Moxifloxacin] Other (See Comments)    C.diff colitis   . Neurontin [Gabapentin] Diarrhea    abd cramping   . Nitrofurantoin Monohyd Macro Hives  . Percocet [Oxycodone-Acetaminophen] Hives  . Pulmicort Turbuhaler [Budesonide] Other (See Comments)    Blisters in mouth   . Amoxicillin Rash    Has patient had a PCN reaction causing immediate rash, facial/tongue/throat swelling, SOB or lightheadedness with hypotension: No Has patient had a PCN reaction causing severe rash involving mucus membranes or skin necrosis: No Has patient had a PCN reaction that required hospitalization: No Has patient had a PCN reaction occurring  within the last 10 years: No If all of the above answers are "NO", then may proceed with Cephalosporin use.   . Chlorhexidine Rash  . Ciprofloxacin Hcl Rash  . Clindamycin/Lincomycin Rash  . Doxycycline Rash  . Entex Hives and Rash  . Hydrocodone Rash  . Tavist-D [Albertsons Dayhist-D] Rash  . Tizanidine Rash  . Witch Hazel Rash     Current Outpatient Medications:  .  Calcium Carb-Cholecalciferol (CALCIUM 600 + D PO), Take 1 tablet by mouth 2 (two) times daily., Disp: , Rfl:  .  carboxymethylcellulose (REFRESH PLUS) 0.5 % SOLN, Place 1 drop into both eyes daily., Disp: , Rfl:  .  Cholecalciferol (VITAMIN D) 2000 units tablet, Take 2,000 Units by mouth daily., Disp: , Rfl:  .  cycloSPORINE (RESTASIS) 0.05 % ophthalmic emulsion, 1 drop 2 (two) times daily., Disp: , Rfl:  .  estradiol (ESTRACE) 1 MG tablet, Take 1 tablet (1 mg total) by mouth 4 (four) times a week., Disp: 48 tablet, Rfl: 3 .  Glucosamine HCl 1000 MG TABS, Take 1,000 mg by mouth daily., Disp: , Rfl:  .  pantoprazole (PROTONIX) 40 MG tablet, Take 1 tablet (40 mg total) by mouth 2 (two) times daily., Disp: 60 tablet, Rfl: 1 .  vitamin B-12 (CYANOCOBALAMIN) 1000 MCG tablet, Take 1,000 mcg by mouth daily., Disp: , Rfl:  .  vitamin E 1000 UNIT capsule, Take 1,000 Units by mouth daily.  , Disp: , Rfl:  .  amLODipine (NORVASC) 5 MG tablet, Take 1 tablet (5  mg total) by mouth daily. (Patient not taking: Reported on 03/09/2018), Disp: 30 tablet, Rfl: 5 .  cefdinir (OMNICEF) 300 MG capsule, Take 1 capsule (300 mg total) by mouth 2 (two) times daily. (Patient not taking: Reported on 03/09/2018), Disp: 20 capsule, Rfl: 0 .  neomycin-polymyxin-hydrocortisone (CORTISPORIN) 3.5-10000-1 OTIC suspension, instill 4 drops into affected ear three to four times a day, Disp: , Rfl: 0  Review of Systems  Constitutional: Negative for appetite change, chills, fatigue and fever.  HENT: Negative for congestion, rhinorrhea and sore throat.   Eyes:  Negative for pain.  Respiratory: Negative for cough, chest tightness and shortness of breath.   Cardiovascular: Negative for chest pain and palpitations.  Gastrointestinal: Negative for abdominal pain, anorexia, diarrhea, nausea and vomiting.  Musculoskeletal: Negative for joint pain.  Skin: Positive for rash. Negative for nail changes.  Neurological: Negative for dizziness and weakness.    Social History   Tobacco Use  . Smoking status: Never Smoker  . Smokeless tobacco: Never Used  Substance Use Topics  . Alcohol use: No    Alcohol/week: 0.0 oz   Objective:   BP (!) 158/76 (BP Location: Right Arm, Patient Position: Sitting, Cuff Size: Normal)   Pulse 80   Temp 97.8 F (36.6 C) (Oral)   Resp 16   Wt 141 lb (64 kg)   SpO2 96%   BMI 20.82 kg/m  Vitals:   03/09/18 0955  BP: (!) 158/76  Pulse: 80  Resp: 16  Temp: 97.8 F (36.6 C)  TempSrc: Oral  SpO2: 96%  Weight: 141 lb (64 kg)     Physical Exam  General appearance: alert, well developed, well nourished, cooperative and in no distress Head: Normocephalic, without obvious abnormality, atraumatic Respiratory: Respirations even and unlabored, normal respiratory rate Extremities: diffuse fine rash on erythematous base both forearms and scalp.      Assessment & Plan:     1. Allergic reaction to drug, initial encounter  - predniSONE (DELTASONE) 10 MG tablet; 6 tablets for 1 day, then 5 for 1 day, then 4 for 1 day, then 3 for 1 day, then 2 for 1 day then 1 for 1 day.  Dispense: 21 tablet; Refill: 0  Call if symptoms change or if not rapidly improving.     2. Essential hypertension She didn't tolerate 5mg  amlodipine due to edema. Will try lower dose.  - amLODipine (NORVASC) 2.5 MG tablet; Take 1 tablet (2.5 mg total) by mouth daily.  Dispense: 30 tablet; Refill: 2       Lelon Huh, MD  [facility]

## 2018-03-11 ENCOUNTER — Ambulatory Visit: Payer: Medicare Other | Admitting: Physical Therapy

## 2018-03-16 ENCOUNTER — Ambulatory Visit: Payer: Medicare Other | Admitting: Physical Therapy

## 2018-03-18 ENCOUNTER — Encounter: Payer: Medicare Other | Admitting: Physical Therapy

## 2018-03-23 ENCOUNTER — Encounter: Payer: Medicare Other | Admitting: Physical Therapy

## 2018-03-25 ENCOUNTER — Encounter: Payer: Medicare Other | Admitting: Physical Therapy

## 2018-03-30 ENCOUNTER — Encounter: Payer: Medicare Other | Admitting: Physical Therapy

## 2018-04-01 ENCOUNTER — Encounter: Payer: Medicare Other | Admitting: Physical Therapy

## 2018-04-13 ENCOUNTER — Encounter: Payer: Self-pay | Admitting: Family Medicine

## 2018-04-22 ENCOUNTER — Encounter: Payer: Self-pay | Admitting: Family Medicine

## 2018-04-22 ENCOUNTER — Ambulatory Visit: Payer: Medicare Other | Admitting: Family Medicine

## 2018-04-22 VITALS — BP 130/84 | HR 76 | Temp 98.4°F | Resp 16 | Wt 140.0 lb

## 2018-04-22 DIAGNOSIS — H6192 Disorder of left external ear, unspecified: Secondary | ICD-10-CM | POA: Diagnosis not present

## 2018-04-22 NOTE — Patient Instructions (Signed)
We will call regarding the ENT referral.

## 2018-04-22 NOTE — Progress Notes (Signed)
  Subjective:     Patient ID: Wendy Patton, female   DOB: 22-Apr-1936, 82 y.o.   MRN: [id number] Chief Complaint  Patient presents with  . Ear Pain    Patient comes in office today with concerns of a possible knot in her left ear. Patient states that she noticed the knot about a month ago and it has increased in size and tender to the touch, patient reports pain radiating down into jaw.    HPI States she washes her ears out weekly per her ENT, Dr. Tami Ribas. Accompanied by her husband.  Review of Systems     Objective:   Physical Exam  Constitutional: She appears well-developed and well-nourished. No distress.  HENT:  Left external ear canal with well circumscribed lesion arising from the floor of the canal. Mildly erythematous with small erosive appearing area without out drainage: Procedure: Lesion punctured with tip of Bayonet blade with blood return only. Cauterized with sliver nitrate stick.       Assessment:    1. Lesion of external ear canal, left - Ambulatory referral to ENT    Plan:    Continue ear irrigations as previously.

## 2018-04-22 NOTE — Progress Notes (Deleted)
e

## 2018-06-27 ENCOUNTER — Encounter: Payer: Self-pay | Admitting: Family Medicine

## 2018-06-27 ENCOUNTER — Ambulatory Visit: Payer: Medicare Other | Admitting: Family Medicine

## 2018-06-27 VITALS — BP 110/60 | HR 64 | Temp 97.9°F | Resp 15 | Wt 139.6 lb

## 2018-06-27 DIAGNOSIS — R55 Syncope and collapse: Secondary | ICD-10-CM | POA: Diagnosis not present

## 2018-06-27 DIAGNOSIS — S0993XA Unspecified injury of face, initial encounter: Secondary | ICD-10-CM | POA: Diagnosis not present

## 2018-06-27 NOTE — Patient Instructions (Addendum)
We will call you the lab results and check on how you are doing. Keep wounds clean with soap and water. Continue cold compresses for one more day.

## 2018-06-27 NOTE — Progress Notes (Addendum)
  Subjective:     Patient ID: Wendy Patton, female   DOB: 05/13/1936, 82 y.o.   MRN: [id number] Chief Complaint  Patient presents with  . Loss of Consciousness    Patient comes in office today after having syncope episode 24 hours ago while unloading truck with husband. Patients husband states that he had went in house and when he came out patient was laying face first in gravel . Husband states that patient was unconsicous for at least 4 min, patient does not recall prior to passing out.    HPI States she did not lose control of her bowel or bladder.Denies palpitations, dizziness or stressful event prior to the episode. States she ate yesterday. Husband states she is acting and thinking normally today.  Review of Systems     Objective:   Physical Exam  Constitutional: She appears well-developed and well-nourished. No distress.  HENT:  Ear canals narrow-unable to visualize her  TM's.  Eyes: EOM are normal.  Pupils equal and small  Neck: Carotid bruit is not present.  Cardiovascular: Normal rate and regular rhythm.  Pulmonary/Chest: Breath sounds normal.  Musculoskeletal: She exhibits no edema.  Neurological: Coordination (Romberg negative; finger to nose and heel to toe WNL) normal.  Skin:  Left facial abrasions with contusion below her left eye. No orbital crepitus       Assessment:    1. Syncope, unspecified syncope type: ? etiology - CBC with Differential/Platelet - Renal function panel  2. Facial injury, initial encounter    Plan:    Discussed facial injury care with further f/u pending labs. Consider CT scan if neuro sx present. Encouraged husband to carry her to ER if recurrence.

## 2018-06-28 ENCOUNTER — Other Ambulatory Visit: Payer: Self-pay | Admitting: Family Medicine

## 2018-06-28 DIAGNOSIS — R55 Syncope and collapse: Secondary | ICD-10-CM

## 2018-06-28 LAB — RENAL FUNCTION PANEL
Albumin: 4.4 g/dL (ref 3.5–4.7)
BUN / CREAT RATIO: 20 (ref 12–28)
BUN: 21 mg/dL (ref 8–27)
CO2: 24 mmol/L (ref 20–29)
CREATININE: 1.04 mg/dL — AB (ref 0.57–1.00)
Calcium: 9.9 mg/dL (ref 8.7–10.3)
Chloride: 98 mmol/L (ref 96–106)
GFR calc Af Amer: 59 mL/min/{1.73_m2} — ABNORMAL LOW (ref >59)
GFR, EST NON AFRICAN AMERICAN: 51 mL/min/{1.73_m2} — AB (ref >59)
GLUCOSE: 81 mg/dL (ref 65–99)
POTASSIUM: 4.4 mmol/L (ref 3.5–5.2)
Phosphorus: 3.4 mg/dL (ref 2.5–4.5)
Sodium: 137 mmol/L (ref 134–144)

## 2018-06-28 LAB — CBC WITH DIFFERENTIAL/PLATELET
Basophils Absolute: 0 10*3/uL (ref 0.0–0.2)
Basos: 0 %
EOS (ABSOLUTE): 0.1 10*3/uL (ref 0.0–0.4)
EOS: 2 %
Hematocrit: 41.3 % (ref 34.0–46.6)
Hemoglobin: 13.3 g/dL (ref 11.1–15.9)
IMMATURE GRANS (ABS): 0 10*3/uL (ref 0.0–0.1)
Immature Granulocytes: 0 %
LYMPHS: 29 %
Lymphocytes Absolute: 2 10*3/uL (ref 0.7–3.1)
MCH: 27.4 pg (ref 26.6–33.0)
MCHC: 32.2 g/dL (ref 31.5–35.7)
MCV: 85 fL (ref 79–97)
Monocytes Absolute: 0.5 10*3/uL (ref 0.1–0.9)
Monocytes: 7 %
Neutrophils Absolute: 4.5 10*3/uL (ref 1.4–7.0)
Neutrophils: 62 %
PLATELETS: 257 10*3/uL (ref 150–450)
RBC: 4.85 x10E6/uL (ref 3.77–5.28)
RDW: 13.1 % (ref 12.3–15.4)
WBC: 7.2 10*3/uL (ref 3.4–10.8)

## 2018-07-05 ENCOUNTER — Ambulatory Visit
Admission: RE | Admit: 2018-07-05 | Discharge: 2018-07-05 | Disposition: A | Discharge status: Home / Self Care | Payer: Medicare Other | Source: Ambulatory Visit | Attending: Family Medicine | Admitting: Family Medicine

## 2018-07-05 DIAGNOSIS — R55 Syncope and collapse: Secondary | ICD-10-CM | POA: Insufficient documentation

## 2018-07-14 ENCOUNTER — Other Ambulatory Visit: Payer: Self-pay | Admitting: Family Medicine

## 2018-07-14 DIAGNOSIS — Z1231 Encounter for screening mammogram for malignant neoplasm of breast: Secondary | ICD-10-CM

## 2018-08-04 ENCOUNTER — Other Ambulatory Visit: Payer: Self-pay | Admitting: Family Medicine

## 2018-08-04 DIAGNOSIS — Z780 Asymptomatic menopausal state: Secondary | ICD-10-CM

## 2018-08-04 DIAGNOSIS — N952 Postmenopausal atrophic vaginitis: Secondary | ICD-10-CM

## 2018-08-04 DIAGNOSIS — R350 Frequency of micturition: Secondary | ICD-10-CM

## 2018-08-04 MED ORDER — ESTRADIOL 1 MG PO TABS: 1.0000 mg | ORAL_TABLET | ORAL | 3 refills | Status: DC

## 2018-08-04 NOTE — Telephone Encounter (Signed)
[facility] faxed refill request for the following medications:  estradiol (ESTRACE) 1 MG tablet   Date written: 08/07/2017  Last dispensed: 05/07/2018   Please advise.

## 2018-08-08 DIAGNOSIS — D481 Neoplasm of uncertain behavior of connective and other soft tissue: Secondary | ICD-10-CM | POA: Diagnosis not present

## 2018-08-08 DIAGNOSIS — H6123 Impacted cerumen, bilateral: Secondary | ICD-10-CM | POA: Diagnosis not present

## 2018-08-08 DIAGNOSIS — H903 Sensorineural hearing loss, bilateral: Secondary | ICD-10-CM | POA: Diagnosis not present

## 2018-08-08 DIAGNOSIS — R42 Dizziness and giddiness: Secondary | ICD-10-CM | POA: Diagnosis not present

## 2018-08-11 ENCOUNTER — Ambulatory Visit
Admission: RE | Admit: 2018-08-11 | Discharge: 2018-08-11 | Disposition: A | Discharge status: Home / Self Care | Payer: PPO | Source: Ambulatory Visit | Attending: Family Medicine | Admitting: Family Medicine

## 2018-08-11 ENCOUNTER — Other Ambulatory Visit: Payer: Self-pay | Admitting: Family Medicine

## 2018-08-11 DIAGNOSIS — Z1231 Encounter for screening mammogram for malignant neoplasm of breast: Secondary | ICD-10-CM | POA: Insufficient documentation

## 2018-08-11 DIAGNOSIS — R928 Other abnormal and inconclusive findings on diagnostic imaging of breast: Secondary | ICD-10-CM

## 2018-08-11 DIAGNOSIS — N6310 Unspecified lump in the right breast, unspecified quadrant: Secondary | ICD-10-CM

## 2018-08-17 ENCOUNTER — Telehealth: Payer: Self-pay

## 2018-08-17 DIAGNOSIS — M25512 Pain in left shoulder: Secondary | ICD-10-CM | POA: Diagnosis not present

## 2018-08-17 DIAGNOSIS — M1611 Unilateral primary osteoarthritis, right hip: Secondary | ICD-10-CM | POA: Diagnosis not present

## 2018-08-17 NOTE — Telephone Encounter (Signed)
Left vm for pt to return call about needing information on new insurance coverage for Prior auth on medication ESTRADIOL 1 mg.  dbs

## 2018-08-18 ENCOUNTER — Other Ambulatory Visit: Payer: PPO

## 2018-08-18 DIAGNOSIS — C44622 Squamous cell carcinoma of skin of right upper limb, including shoulder: Secondary | ICD-10-CM | POA: Diagnosis not present

## 2018-08-18 DIAGNOSIS — D485 Neoplasm of uncertain behavior of skin: Secondary | ICD-10-CM | POA: Diagnosis not present

## 2018-08-19 ENCOUNTER — Ambulatory Visit
Admission: RE | Admit: 2018-08-19 | Discharge: 2018-08-19 | Disposition: A | Discharge status: Home / Self Care | Payer: PPO | Source: Ambulatory Visit | Attending: Family Medicine | Admitting: Family Medicine

## 2018-08-19 DIAGNOSIS — R928 Other abnormal and inconclusive findings on diagnostic imaging of breast: Secondary | ICD-10-CM | POA: Insufficient documentation

## 2018-08-19 DIAGNOSIS — N6310 Unspecified lump in the right breast, unspecified quadrant: Secondary | ICD-10-CM | POA: Diagnosis not present

## 2018-08-19 DIAGNOSIS — N6001 Solitary cyst of right breast: Secondary | ICD-10-CM | POA: Diagnosis not present

## 2018-09-20 DIAGNOSIS — H16223 Keratoconjunctivitis sicca, not specified as Sjogren's, bilateral: Secondary | ICD-10-CM | POA: Diagnosis not present

## 2018-09-21 ENCOUNTER — Ambulatory Visit: Payer: PPO | Admitting: Physical Therapy

## 2018-09-21 DIAGNOSIS — C44622 Squamous cell carcinoma of skin of right upper limb, including shoulder: Secondary | ICD-10-CM | POA: Diagnosis not present

## 2018-10-03 ENCOUNTER — Ambulatory Visit: Payer: PPO | Admitting: Physical Therapy

## 2018-10-11 ENCOUNTER — Encounter: Payer: PPO | Admitting: Physical Therapy

## 2018-10-11 DIAGNOSIS — L538 Other specified erythematous conditions: Secondary | ICD-10-CM | POA: Diagnosis not present

## 2018-10-11 DIAGNOSIS — R208 Other disturbances of skin sensation: Secondary | ICD-10-CM | POA: Diagnosis not present

## 2018-10-11 DIAGNOSIS — L820 Inflamed seborrheic keratosis: Secondary | ICD-10-CM | POA: Diagnosis not present

## 2018-10-12 ENCOUNTER — Other Ambulatory Visit: Payer: Self-pay

## 2018-10-12 ENCOUNTER — Ambulatory Visit: Payer: PPO | Attending: Orthopaedic Surgery

## 2018-10-12 DIAGNOSIS — G8929 Other chronic pain: Secondary | ICD-10-CM

## 2018-10-12 DIAGNOSIS — R2689 Other abnormalities of gait and mobility: Secondary | ICD-10-CM | POA: Diagnosis not present

## 2018-10-12 DIAGNOSIS — M6281 Muscle weakness (generalized): Secondary | ICD-10-CM | POA: Diagnosis not present

## 2018-10-12 DIAGNOSIS — M25562 Pain in left knee: Secondary | ICD-10-CM | POA: Diagnosis not present

## 2018-10-12 DIAGNOSIS — M25561 Pain in right knee: Secondary | ICD-10-CM | POA: Insufficient documentation

## 2018-10-12 NOTE — Therapy (Addendum)
[place] MAIN [facility] SERVICES [address] [place], [place], [address] Phone: [phone]   Fax:  [phone]  Physical Therapy Evaluation  Patient Details  Name: Wendy Patton MRN: [id number] Date of Birth: 1935/10/21 Referring Provider (PT): Melrose Nakayama   Encounter Date: 10/12/2018  PT End of Session - 10/12/18 1451    Visit Number  1    Number of Visits  6    Date for PT Re-Evaluation  11/23/18    PT Start Time  1346    PT Stop Time  1445    PT Time Calculation (min)  59 min    Activity Tolerance  Patient tolerated treatment well    Behavior During Therapy  [facility] for tasks assessed/performed       Past Medical History:  Diagnosis Date  . Anemia    after jaw surgery was on iron for short period of time  . Arthritis   . Cancer (HCC)    squamous cell  . Difficult intubation    potential for difficult airway due to limited mouth opening  . Dry skin   . GERD (gastroesophageal reflux disease)    takes Aciphex daily  . Headache(784.0)    hx of migraines   . Heart murmur   . History of migraine    last one Aug 2014 and takes Imitrex daily as needed  . History of staph infection   . IBS (irritable bowel syndrome)   . Joint pain   . Joint swelling   . Laryngopharyngeal reflux   . MVP (mitral valve prolapse)   . Neuralgia   . Neuropathy    takes gabapentin daily  . PONV (postoperative nausea and vomiting)    TMJ surgery several times  . Rheumatic fever    as a child  . Shortness of breath    with exertion  . Thyroid disease     Past Surgical History:  Procedure Laterality Date  . ABDOMINAL HYSTERECTOMY  2009  . BREAST DUCTAL SYSTEM EXCISION Right 01/11/2014   Procedure: MAJOR DUCT EXCISION RIGHT BREAST;  Surgeon: Stark Klein, MD;  Location: [facility] ORS;  Service: General;  Laterality: Right;  . BREAST EXCISIONAL BIOPSY Right 2015   neg  . BREAST EXCISIONAL BIOPSY Left 2010   benign papilloma removed  . BREAST  EXCISIONAL BIOPSY Left 1975   neg  . BREAST SURGERY  1975, 2010   benign breast tumor  . breat excision  1975   benign breast tumor excision  . CATARACT EXTRACTION W/PHACO Right 07/15/2016   Procedure: CATARACT EXTRACTION PHACO AND INTRAOCULAR LENS PLACEMENT (IOC);  Surgeon: Estill Cotta, MD;  Location: [facility] ORS;  Service: Ophthalmology;  Laterality: Right;  Lot # [id number] H [place]: 01:38.6 AP%: 25.0 CDE: 46.89  . CATARACT EXTRACTION W/PHACO Left 09/02/2016   Procedure: CATARACT EXTRACTION PHACO AND INTRAOCULAR LENS PLACEMENT (IOC);  Surgeon: Estill Cotta, MD;  Location: [facility] ORS;  Service: Ophthalmology;  Laterality: Left;  [place] 2:05.3 AP% 24.9 CDE 51.84 Fluid pack lot # [id number]  . cervial polyps  1999  . [place]  . COLONOSCOPY    . CYSTECTOMY  1962   vaginal cyst removal  . DILATION AND CURETTAGE OF UTERUS  1984  . ESOPHAGOGASTRODUODENOSCOPY    . granuloma  2002   chin granuloma removed  . [place], 2002, 2004   prosthesis, condyles implants, fossa replaced  . SIGMOIDOSCOPY  1993  . SQUAMOUS CELL CARCINOMA EXCISION  2008   left ankle/skin graft  . [place]  . THYROIDECTOMY  2006   left  . TONSILLECTOMY AND ADENOIDECTOMY  1944  . TOTAL HIP ARTHROPLASTY Left 09/12/2013   Procedure: LEFT TOTAL HIP ARTHROPLASTY ANTERIOR APPROACH;  Surgeon: [name], MD;  Location: [place];  Service: Orthopedics;  Laterality: Left;  . TRAPEZIUM RESECTION  2000, 2004   removal right & left hand trapezium  . TUBAL LIGATION    . uterine polyps  2008  . vaginal cyst removed  1962    There were no vitals filed for this visit.   Subjective Assessment - 10/12/18 1356    Subjective  Patient's main complaint is that her knees hurt that most, with some R hip pain    Pertinent History  Patient is 83 yo female that presents with chronic bilateral knee pain, and R hip pain. Patient's main complaint is bilateral  knee pain, especially with stairs and sit to stands. Patient does state that she has been told that she has arthritis behind both patellas. PMH of skin cancer, L THA, migraines, IBS, neuropathy. Stated she has not fallen in the last 6 months, and says that is good for her.    Limitations  Sitting;Lifting;Standing;Walking    How long can you sit comfortably?  unlimited    How long can you stand comfortably?  unlimited    How long can you walk comfortably?  unlimited    Diagnostic tests  XRay "last month per patient report has no cartilage behind bilat patella"    Patient Stated Goals  to stretch out her legs, have better mobility, sit to stand without arms    Currently in Pain?  No/denies   6/10, best: 0/10   Pain Location  Knee    Pain Orientation  Right;Left    Pain Descriptors / Indicators  Sharp    Pain Type  Chronic pain    Pain Onset  More than a month ago    Pain Frequency  Intermittent    Aggravating Factors   squatting, bending, stairs    Pain Relieving Factors  rest    Effect of Pain on Daily Activities  impedes daily activities and exercise program    Multiple Pain Sites  Yes    Pain Score  --   no pain currently   Pain Location  Hip    Pain Orientation  Right    Pain Descriptors / Indicators  Dull    Pain Type  Chronic pain    Pain Onset  More than a month ago    Pain Frequency  Constant    Aggravating Factors   squatting, bending, stairs    Pain Relieving Factors  rest    Effect of Pain on Daily Activities  impedes functional activities, exercise program         [facility] PT Assessment - 10/13/18 0001      Assessment   Medical Diagnosis  bilateral knee pain, bilateral hip pain    Referring Provider (PT)  Rhona Raider, Peter    Hand Dominance  Right      Balance Screen   Has the patient fallen in the past 6 months  No      [place] residence    Living Arrangements  Spouse/significant other    Available Help at Discharge  Family     Type of [place] to enter  Entrance Stairs-Number of Steps  4    Entrance Stairs-Rails  Right    Home Layout  Two level    Alternate Level Stairs-Number of Steps  12    Alternate Level Stairs-Rails  Right      Prior Function   Level of Independence  Independent      Cognition   Overall Cognitive Status  Within Functional Limits for tasks assessed      ROM / Strength   AROM / PROM / Strength  AROM;Strength      AROM   Overall AROM   Within functional limits for tasks performed      Strength   Overall Strength  Deficits    Strength Assessment Site  Hip;Knee;Ankle    Right/Left Hip  Right;Left    Right Hip Flexion  4/5    Right Hip External Rotation   3+/5    Right Hip Internal Rotation  3+/5    Right Hip ABduction  4-/5    Right Hip ADduction  4-/5    Left Hip Flexion  4/5    Left Hip External Rotation  3+/5    Left Hip Internal Rotation  3+/5    Left Hip ABduction  4-/5    Left Hip ADduction  4-/5    Right/Left Knee  Right;Left    Right Knee Flexion  4/5   painful   Right Knee Extension  4/5   painful   Left Knee Flexion  4/5   painful   Left Knee Extension  4/5   painful   Right/Left Ankle  Right;Left    Right Ankle Dorsiflexion  4+/5    Right Ankle Plantar Flexion  4+/5    Left Ankle Dorsiflexion  4+/5    Left Ankle Plantar Flexion  4+/5      Flexibility   Soft Tissue Assessment /Muscle Length  --   hamstring length normal     Palpation   Patella mobility  painful superior glides on L, painful inferior glides on R    Palpation comment  TTP of R pes anserine, quad tendon, L patellar tendon      Special Tests   Other special tests  negative SLR, + FABER for ROM deficits b/l, +patellar grind        OUTCOME MEASURES: TEST Outcome Interpretation  5 times sit<>stand 12 sec, but use of hands + knee pain >60 yo, >15 sec indicates increased risk for falls  LEFS 48/80     Single leg stance: Patient demonstrated single leg  stance bilaterally ~5 seconds or so with moderate sway noted.  Objective measurements completed on examination: See above findings.     Access Code: [email or web address]   Pt and PT performed squats with and without medial glides to patient's patellar. Pt pain greatly reduced/did not hurt with PT assist for medial glides of patellar.  Patient also given information and resources about tens unit for use for home.  Therapeutic Exercises: Performed with verbal and visual cues from PT, mod cues throughout session to maintain exercise form and technique. Added to HEP, administered handout, pt with no questions at this time. Clamshell with red theraband Resistance - 15 bilaterally Supine Bridge with Mini Swiss Ball Between Knees - 20 bilaterally Hooklying Clamshell with blue theraband Resistance - 20 reps Straight Leg Raise with External Rotation - 20 reps Seated Long Arc Quad with Hip Adduction - 20 reps Standing ITB Stretch - verbally explained and added to HEP Tourist information centre manager with Chair and [employer]  1 rep bilaterally x30seconds   Patient is very pleasant, active 83 yo female that presents to clinic with bilateral knee pain and R hip pain. Patient currently participates in yoga and her own exercise program, and continued with some HEP from previous experience physical therapy. Upon assessment patient demonstrated bilateral LE strength deficits, balance deficits, pain with functional activities such as bending, squatting, and stair navigation, and tenderness to palpation of bilateral knees. The patient's knee pain improved during squats with medial patellar glides via the PT. The patient would benefit from skilled PT intervention to address these deficits, HEP updates/progression, and pain management.         PT Education - 10/12/18 1607    Education Details  HEP, POC, condition, exercise techniques    Person(s) Educated  Patient    Methods  Explanation;Demonstration;Verbal cues     Comprehension  Verbalized understanding;Returned demonstration       PT Short Term Goals - 10/12/18 1609      PT SHORT TERM GOAL #1   Title  Pt will be independent with initial HEP in order to improve strength and balance in order to decrease fall risk and improve function at home and work    Time  3    Period  Weeks    Status  New    Target Date  11/02/18        PT Long Term Goals - 10/12/18 1609      PT LONG TERM GOAL #1   Title  Pt will improve LEFS score by at least 5 points in order to demonstrate an improved ability to perform functional activities.    Baseline  10/12/2018 48/80    Time  6    Period  Weeks    Status  New    Target Date  11/23/18      PT LONG TERM GOAL #2   Title  Pt will decrease worst pain as reported on NPRS during squatting/stairs to 3/10 or less in order to demonstrate clinically significant reduction in pain.    Baseline  10/12/2018 6/10    Time  6    Period  Weeks    Status  New    Target Date  11/23/18      PT LONG TERM GOAL #3   Title  Patient will demonstrate LE strength of at least 4+/5 to improve ability to perform functional activities, exercise program, and decrease risk of falls.    Baseline  10/12/2018 see eval    Time  6    Period  Weeks    Status  New    Target Date  11/23/18      PT LONG TERM GOAL #4   Title  Patient will demonstrate 5 times sit to stand with 1 HHA support or less to demonstrate improved functional abilities, as well as pain 3/10 or less.     Baseline  12 secs with increased knee pain, bilateral hand support    Time  6    Period  Weeks    Status  New    Target Date  11/23/18             Plan - 10/13/18 1253    Clinical Impression Statement  Patient is very pleasant, active 83 yo female that presents to clinic with bilateral knee pain and R hip pain. Patient currently participates in yoga and her own exercise program, and continued with some HEP from previous experience physical therapy. Upon assessment  patient demonstrated  bilateral LE strength deficits, pain with functional activities such as bending, squatting, and stair navigation, and tenderness to palpation of bilateral knees. The patient's knee pain improved during squats with medial patellar glides via the PT. The patient would benefit from skilled PT intervention to address these deficits, HEP updates/progression, and pain management.     Personal Factors and Comorbidities  Age;Comorbidity 3+    Comorbidities   LTHA, previous history of cancer, history of falls, arthritis, heart murmur, SOB, thyroid disease    Examination-Activity Limitations  Sit;Transfers;Bed Mobility;Bend;Lift;Squat;Locomotion Level;Stairs    Examination-Participation Restrictions  Yard Work;Cleaning;Community Activity;Shop    Stability/Clinical Decision Making  Stable/Uncomplicated    Clinical Decision Making  Low    Rehab Potential  Good    Clinical Impairments Affecting Rehab Potential  (+) Active lifestyle, strong social support, motivation, (-) age, chronicity of sx, pain in bilat LE, hx of falls    PT Frequency  1x / week    PT Duration  6 weeks    PT Treatment/Interventions  ADLs/Self Care Home Management;Electrical Stimulation;Therapeutic activities;Patient/family education;Therapeutic exercise;Iontophoresis 4mg /ml Dexamethasone;Aquatic Therapy;Gait training;Balance training;Passive range of motion;Neuromuscular re-education;Moist Heat;Stair training;Traction;Ultrasound;Cryotherapy;Functional mobility training;Manual techniques;Dry needling;Taping;Canalith Repostioning;Vestibular;Joint Manipulations;Spinal Manipulations    PT Next Visit Plan  Tens unit for potential home use, medial quad/vmo strengthening, HEP update, balance training    PT Home Exercise Plan  see pt instruction section    Consulted and Agree with Plan of Care  Patient       Patient will benefit from skilled therapeutic intervention in order to improve the following deficits and impairments:   Abnormal gait, Increased fascial restricitons, Improper body mechanics, Pain, Decreased coordination, Decreased mobility, Decreased activity tolerance, Decreased endurance, Decreased strength, Decreased range of motion, Decreased balance, Difficulty walking, Impaired flexibility  Visit Diagnosis: Chronic pain of left knee - Plan: PT plan of care cert/re-cert  Chronic pain of right knee - Plan: PT plan of care cert/re-cert  Muscle weakness (generalized) - Plan: PT plan of care cert/re-cert  Other abnormalities of gait and mobility - Plan: PT plan of care cert/re-cert     Problem List Patient Active Problem List   Diagnosis Date Noted  . Pectus excavatum 10/12/2017  . 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 Perimenopausal atrophic vaginitis 09/22/2016  . Menopausal hot flushes 09/22/2016  . Mitral valve prolapse 01/13/2016  . Herniation of nucleus pulposus 05/01/2015  . Hypercholesteremia 05/01/2015  . Osteoarthritis of both knees 05/01/2015  . History of migraine headaches 05/01/2015  . Esophageal reflux 01/10/2015  . S/P total hip arthroplasty 09/12/2013    Lieutenant Diego PT, DPT 1:17 PM,10/13/18 [place] MAIN [facility] SERVICES [address] [place], [place], [address] Phone: [phone]   Fax:  [phone]  Name: Wendy Patton MRN: [id number] Date of Birth: January 28, 1936

## 2018-10-12 NOTE — Patient Instructions (Signed)
Access Code: [email or web address]  URL: [email or web address]  Date: 10/12/2018  Prepared by: Lieutenant Diego   Exercises  Clamshell with Resistance - 10 reps - 3 sets - 1x daily - 7x weekly  Supine Bridge with Mini Swiss Ball Between Knees - 10 reps - 3 sets - 1x daily - 7x weekly  Hooklying Clamshell with Resistance - 10 reps - 3 sets - 1x daily - 7x weekly  Straight Leg Raise with External Rotation - 10 reps - 3 sets - 1x daily - 7x weekly  Seated Long Arc Quad with Hip Adduction - 10 reps - 3 sets - 1x daily - 7x weekly  Standing ITB Stretch - 3 reps - 1 sets - 60 hold - 1x daily - 7x weekly  Quadricep Stretch with Chair and Counter Support - 3 reps - 1 sets - 60 hold - 1x daily - 7x weekly

## 2018-10-13 ENCOUNTER — Encounter: Payer: PPO | Admitting: Physical Therapy

## 2018-10-18 ENCOUNTER — Encounter: Payer: PPO | Admitting: Physical Therapy

## 2018-10-19 ENCOUNTER — Ambulatory Visit: Payer: PPO | Admitting: Physical Therapy

## 2018-10-20 ENCOUNTER — Encounter: Payer: PPO | Admitting: Physical Therapy

## 2018-10-24 ENCOUNTER — Encounter: Payer: PPO | Admitting: Physical Therapy

## 2018-10-25 ENCOUNTER — Encounter: Payer: PPO | Admitting: Physical Therapy

## 2018-10-25 NOTE — Therapy (Unsigned)
[place] MAIN [facility] SERVICES [address] [place], [place], [address] Phone: [phone]   Fax:  [phone]  Patient Details  Name: Wendy Patton MRN: [id number] Date of Birth: 1936/07/14 Referring Provider:  No ref. provider found  Encounter Date: 10/25/2018     Patient called by PT to ensure patient is doing well, does not have questions, and review HEP. Called due to current outpatient closure for COVID- 19. Patient did not pick up PT call. PT left voicemail stating reason for call and number for call back for further questions/concerns.   Lieutenant Diego PT, DPT 10:41 AM,10/25/18 [place] MAIN [facility] SERVICES [address] [place], [place], [address] Phone: [phone]   Fax:  [phone]

## 2018-10-27 ENCOUNTER — Encounter: Payer: PPO | Admitting: Physical Therapy

## 2018-11-01 ENCOUNTER — Encounter: Payer: PPO | Admitting: Physical Therapy

## 2018-11-03 ENCOUNTER — Encounter: Payer: PPO | Admitting: Physical Therapy

## 2018-11-08 ENCOUNTER — Encounter: Payer: PPO | Admitting: Physical Therapy

## 2018-11-08 ENCOUNTER — Encounter: Payer: Self-pay | Admitting: *Deleted

## 2018-11-10 ENCOUNTER — Encounter: Payer: PPO | Admitting: Physical Therapy

## 2018-11-15 ENCOUNTER — Encounter: Payer: PPO | Admitting: Physical Therapy

## 2018-11-17 ENCOUNTER — Encounter: Payer: PPO | Admitting: Physical Therapy

## 2018-11-21 ENCOUNTER — Encounter: Payer: Self-pay | Admitting: Family Medicine

## 2018-11-22 ENCOUNTER — Encounter: Payer: PPO | Admitting: Physical Therapy

## 2018-11-24 ENCOUNTER — Encounter: Payer: PPO | Admitting: Physical Therapy

## 2018-11-29 ENCOUNTER — Encounter: Payer: Self-pay | Admitting: *Deleted

## 2018-11-29 ENCOUNTER — Encounter: Payer: PPO | Admitting: Physical Therapy

## 2018-12-01 ENCOUNTER — Encounter: Payer: PPO | Admitting: Physical Therapy

## 2018-12-06 ENCOUNTER — Encounter: Payer: PPO | Admitting: Physical Therapy

## 2018-12-08 ENCOUNTER — Encounter: Payer: PPO | Admitting: Physical Therapy

## 2018-12-13 ENCOUNTER — Encounter: Payer: Self-pay | Admitting: Physician Assistant

## 2018-12-13 ENCOUNTER — Other Ambulatory Visit: Payer: Self-pay

## 2018-12-13 ENCOUNTER — Ambulatory Visit (INDEPENDENT_AMBULATORY_CARE_PROVIDER_SITE_OTHER): Payer: PPO | Admitting: Physician Assistant

## 2018-12-13 ENCOUNTER — Encounter: Payer: PPO | Admitting: Physical Therapy

## 2018-12-13 VITALS — BP 161/82 | HR 82 | Temp 97.1°F | Resp 16 | Wt 139.6 lb

## 2018-12-13 DIAGNOSIS — R7989 Other specified abnormal findings of blood chemistry: Secondary | ICD-10-CM | POA: Diagnosis not present

## 2018-12-13 DIAGNOSIS — E7800 Pure hypercholesterolemia, unspecified: Secondary | ICD-10-CM | POA: Diagnosis not present

## 2018-12-13 DIAGNOSIS — Z1239 Encounter for other screening for malignant neoplasm of breast: Secondary | ICD-10-CM

## 2018-12-13 DIAGNOSIS — Z79890 Hormone replacement therapy (postmenopausal): Secondary | ICD-10-CM | POA: Diagnosis not present

## 2018-12-13 DIAGNOSIS — N951 Menopausal and female climacteric states: Secondary | ICD-10-CM | POA: Diagnosis not present

## 2018-12-13 DIAGNOSIS — Z0000 Encounter for general adult medical examination without abnormal findings: Secondary | ICD-10-CM

## 2018-12-13 NOTE — Progress Notes (Signed)
Patient: Wendy Patton, Female    DOB: 06-04-1936, 83 y.o.   MRN: [id number] Visit Date: 12/13/2018  Today's Provider: Trinna Post, PA-C   Chief Complaint  Patient presents with  . Medicare Wellness   Subjective:     Previously saw Mariel Sleet, PA-C who has since retired. Living in [place] with husband, Three children, grown. No grandchildren. 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 Daughter is a PA - C at [employer] in [place], [place]   Annual wellness visit Wendy Patton is a 83 y.o. female. She feels well. She reports exercising. She reports she is sleeping well.  History of osteoarthritis in both knees, for which she received injections. Most recently she was doing physical therapy to work on her adductor muscle strength. She reports this was helping her symptoms, though this has been post poned due to [place].  She has a history of left hip replacement and will likely need a right hip replacement - Dr. Claud Kelp in  [place].  She reports she is on oral estrogen due to recurrent UTI. She reports she was worked up for this by Dr. Bernardo Heater years ago. She was tried on vaginal estrogen cream which she reports an allergy to. She reports the current oral medication is the lowest dose that will prevent her from getting a UTI and that when she stops it, her UTIs come back. She has had a hysterectomy in 2009 done by Dr. Graciela Husbands. Her last mammogram in 08/19/2018 was normal. She denies vasomotor symptoms.   She reports her blood pressure is typically high in the office but brings in at home readings that range in the 130's-140's/80s. -----------------------------------------------------------   Review of Systems  Constitutional: Negative.   HENT: Positive for hearing loss.   Eyes: Positive for pain.  Respiratory: Negative.   Cardiovascular: Negative.     Gastrointestinal: Negative.   Endocrine: Negative.   Genitourinary: Negative.   Musculoskeletal: Positive for arthralgias and joint swelling.  Skin: Negative.   Allergic/Immunologic: Negative.   Neurological: Negative.   Hematological: Negative.   Psychiatric/Behavioral: Negative.     Social History   Socioeconomic History  . Marital status: Married    Spouse name: Not on file  . Number of children: Not on file  . Years of education: Not on file  . Highest education level: Not on file  Occupational History  . Not on file  Social Needs  . Financial resource strain: Not on file  . Food insecurity:    Worry: Not on file    Inability: Not on file  . Transportation needs:    Medical: Not on file    Non-medical: Not on file  Tobacco Use  . Smoking status: Never Smoker  . Smokeless tobacco: Never Used  Substance and Sexual Activity  . Alcohol use: No    Alcohol/week: 0.0 standard drinks  . Drug use: No  . Sexual activity: Yes    Birth control/protection: Surgical  Lifestyle  . Physical activity:    Days per week: Not on file    Minutes per session: Not on file  . Stress: Not on file  Relationships  . Social connections:    Talks on phone: Not on file    Gets together: Not on file    Attends religious service: Not on file    Active member of club or organization: Not on file  Attends meetings of clubs or organizations: Not on file    Relationship status: Not on file  . Intimate partner violence:    Fear of current or ex partner: Not on file    Emotionally abused: Not on file    Physically abused: Not on file    Forced sexual activity: Not on file  Other Topics Concern  . Not on file  Social History Narrative  . Not on file    Past Medical History:  Diagnosis Date  . Anemia    after jaw surgery was on iron for short period of time  . Arthritis   . Cancer (HCC)    squamous cell  . Difficult intubation    potential for difficult airway due to limited  mouth opening  . Dry skin   . GERD (gastroesophageal reflux disease)    takes Aciphex daily  . Headache(784.0)    hx of migraines   . Heart murmur   . History of migraine    last one Aug 2014 and takes Imitrex daily as needed  . History of staph infection   . IBS (irritable bowel syndrome)   . Joint pain   . Joint swelling   . Laryngopharyngeal reflux   . MVP (mitral valve prolapse)   . Neuralgia   . Neuropathy    takes gabapentin daily  . PONV (postoperative nausea and vomiting)    TMJ surgery several times  . Rheumatic fever    as a child  . Shortness of breath    with exertion  . Thyroid disease      Patient Active Problem List   Diagnosis Date Noted  . Pectus excavatum 10/12/2017  . 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 Perimenopausal atrophic vaginitis 09/22/2016  . Menopausal hot flushes 09/22/2016  . Mitral valve prolapse 01/13/2016  . Herniation of nucleus pulposus 05/01/2015  . Hypercholesteremia 05/01/2015  . Osteoarthritis of both knees 05/01/2015  . History of migraine headaches 05/01/2015  . Esophageal reflux 01/10/2015  . S/P total hip arthroplasty 09/12/2013    Past Surgical History:  Procedure Laterality Date  . ABDOMINAL HYSTERECTOMY  2009  . BREAST DUCTAL SYSTEM EXCISION Right 01/11/2014   Procedure: MAJOR DUCT EXCISION RIGHT BREAST;  Surgeon: Stark Klein, MD;  Location: [facility];  Service: General;  Laterality: Right;  . BREAST EXCISIONAL BIOPSY Right 2015   neg  . BREAST EXCISIONAL BIOPSY Left 2010   benign papilloma removed  . BREAST EXCISIONAL BIOPSY Left 1975   neg  . BREAST SURGERY  1975, 2010   benign breast tumor  . breat excision  1975   benign breast tumor excision  . CATARACT EXTRACTION W/PHACO Right 07/15/2016   Procedure: CATARACT EXTRACTION PHACO AND INTRAOCULAR LENS PLACEMENT (IOC);  Surgeon: Estill Cotta, MD;  Location: [facility] ORS;  Service: Ophthalmology;  Laterality: Right;  Lot # [id number] H [place]: 01:38.6 AP%: 25.0 CDE: 46.89   . CATARACT EXTRACTION W/PHACO Left 09/02/2016   Procedure: CATARACT EXTRACTION PHACO AND INTRAOCULAR LENS PLACEMENT (IOC);  Surgeon: Estill Cotta, MD;  Location: [facility] ORS;  Service: Ophthalmology;  Laterality: Left;  [place] 2:05.3 AP% 24.9 CDE 51.84 Fluid pack lot # [id number]  . cervial polyps  1999  . [place]  . COLONOSCOPY    . CYSTECTOMY  1962   vaginal cyst removal  . DILATION AND CURETTAGE OF UTERUS  1984  . ESOPHAGOGASTRODUODENOSCOPY    . granuloma  2002   chin granuloma removed  . [place]  1980, 1984,  1986, 1993, 2002, 2004   prosthesis, condyles implants, fossa replaced  . SIGMOIDOSCOPY  1993  . SQUAMOUS CELL CARCINOMA EXCISION  2008   left ankle/skin graft  . [place]  . THYROIDECTOMY  2006   left  . TONSILLECTOMY AND ADENOIDECTOMY  1944  . TOTAL HIP ARTHROPLASTY Left 09/12/2013   Procedure: LEFT TOTAL HIP ARTHROPLASTY ANTERIOR APPROACH;  Surgeon: [name], MD;  Location: [facility];  Service: Orthopedics;  Laterality: Left;  . TRAPEZIUM RESECTION  2000, 2004   removal right & left hand trapezium  . TUBAL LIGATION    . uterine polyps  2008  . vaginal cyst removed  1962    Her family history includes Heart attack in her maternal grandmother. There is no history of Breast cancer.   Current Outpatient Medications:  .  Calcium Carb-Cholecalciferol (CALCIUM 600 + D PO), Take 1 tablet by mouth 2 (two) times daily., Disp: , Rfl:  .  Cholecalciferol (VITAMIN D) 2000 units tablet, Take 2,000 Units by mouth daily., Disp: , Rfl:  .  cycloSPORINE (RESTASIS) 0.05 % ophthalmic emulsion, 1 drop 2 (two) times daily., Disp: , Rfl:  .  estradiol (ESTRACE) 1 MG tablet, Take 1 tablet (1 mg total) by mouth 4 (four) times a week., Disp: 48 tablet, Rfl: 3 .  Glucosamine HCl 1000 MG TABS, Take 1,000 mg by mouth daily., Disp: , Rfl:  .  pantoprazole (PROTONIX) 40 MG tablet, Take 1 tablet (40 mg total) by mouth 2 (two) times  daily., Disp: 60 tablet, Rfl: 1 .  Polyethyl Glycol-Propyl Glycol (SYSTANE ULTRA) 0.4-0.3 % SOLN, Apply to eye., Disp: , Rfl:  .  sodium chloride (MURO 128) 2 % ophthalmic solution, 1 drop., Disp: , Rfl:  .  vitamin B-12 (CYANOCOBALAMIN) 1000 MCG tablet, Take 1,000 mcg by mouth daily., Disp: , Rfl:  .  vitamin E 1000 UNIT capsule, Take 1,000 Units by mouth daily.  , Disp: , Rfl:   Patient Care Team: Paulene Floor as PCP - General (Physician Assistant) Margarita Rana, MD (Family Medicine)    Objective:    Vitals: BP (!) 161/82 (BP Location: Left Arm, Patient Position: Sitting, Cuff Size: Large)   Pulse 82   Temp (!) 97.1 F (36.2 C) (Oral)   Resp 16   Wt 139 lb 9.6 oz (63.3 kg)   BMI 20.62 kg/m   Physical Exam Constitutional:      Appearance: Normal appearance. She is normal weight.  HENT:     Head: Normocephalic and atraumatic.     Right Ear: Tympanic membrane, ear canal and external ear normal.     Left Ear: Tympanic membrane, ear canal and external ear normal.     Nose: Nose normal.     Mouth/Throat:     Mouth: Mucous membranes are moist.     Pharynx: Oropharynx is clear.  Eyes:     Extraocular Movements: Extraocular movements intact.     Conjunctiva/sclera: Conjunctivae normal.     Pupils: Pupils are equal, round, and reactive to light.  Neck:     Musculoskeletal: Normal range of motion and neck supple.  Cardiovascular:     Rate and Rhythm: Normal rate and regular rhythm.     Pulses: Normal pulses.     Heart sounds: Normal heart sounds.  Pulmonary:     Effort: Pulmonary effort is normal.     Breath sounds: Normal breath sounds.  Abdominal:     General: Bowel sounds are normal.  Musculoskeletal: Normal range of motion.  Skin:    General: Skin is warm and dry.  Neurological:     Mental Status: She is alert and oriented to person, place, and time.  Psychiatric:        Mood and Affect: Mood normal.        Behavior: Behavior normal.        Thought  Content: Thought content normal.        Judgment: Judgment normal.     Activities of Daily Living In your present state of health, do you have any difficulty performing the following activities: 12/13/2018  Hearing? Y  Vision? Y  Difficulty concentrating or making decisions? N  Walking or climbing stairs? Y  Dressing or bathing? N  Doing errands, shopping? N  Some recent data might be hidden    Fall Risk Assessment Fall Risk  12/13/2018 11/16/2017 11/05/2016 01/13/2016  Falls in the past year? 1 Yes Yes No  Number falls in past yr: 1 2 or more 2 or more -  Injury with Fall? 0 - No -     Depression Screen PHQ 2/9 Scores 12/13/2018 11/16/2017 11/05/2016 01/13/2016  PHQ - 2 Score 0 0 0 0   Cognitive Testing - 6-CIT  Correct? Score   What year is it? yes 0 0 or 4  What month is it? yes 0 0 or 3  Memorize:    Pia Mau,  44,  [address]  [place],      What time is it? (within 1 hour) yes 0 0 or 3  Count backwards from 20 yes 0 0, 2, or 4  Name the months of the year yes 0 0, 2, or 4  Repeat name & address above no 6 0, 2, 4, 6, 8, or 10       TOTAL SCORE  6/28   Interpretation:  Normal  Normal (0-7) Abnormal (8-28)       Assessment & Plan:     Annual Wellness Visit  Reviewed patient's Family Medical History Reviewed and updated list of patient's medical providers Assessment of cognitive impairment was done Assessed patient's functional ability Established a written schedule for health screening [place] Completed and Reviewed  Exercise Activities and Dietary recommendations Goals   None     Immunization History  Administered Date(s) Administered  . Influenza Split 05/09/2009, 05/19/2011, 05/09/2012  . Influenza, High Dose Seasonal PF 04/20/2014  . Influenza, Seasonal, Injecte, Preservative Fre 05/26/2017  . Influenza-Unspecified 04/03/2016  . Pneumococcal Conjugate-13 08/20/2014  . Pneumococcal Polysaccharide-23 02/21/1996, 05/09/2009  .  Td 01/13/2016  . Tdap 07/02/2005  . Zoster 10/28/2006  . Zoster Recombinat (Shingrix) 04/08/2018    Health Maintenance  Topic Date Due  . INFLUENZA VACCINE  03/04/2019  . TETANUS/TDAP  01/12/2026  . DEXA SCAN  Completed  . PNA vac Low Risk Adult  Completed     Discussed health benefits of physical activity, and encouraged her to engage in regular exercise appropriate for her age and condition.    1. Annual physical exam  - Comprehensive Metabolic Panel (CMET) - CBC with Differential - TSH - Lipid Profile  2. Hormone replacement therapy (HRT)  I have had long conversation with patient about the risks and benefits of hormone replacement therapy. She is using oral estrogen for recurrent UTIs as she experienced rash and swelling with vaginal estrogen cream. I have counseled patient that risk of oral estrogens include heart attack, blood clot, stroke, and breast cancer.  She is accepting of these risks and wishes to continue taking the medication.  3. Breast cancer screening  - MM Digital Screening; Future  4. Hypercholesteremia  - Lipid Profile  5. Menopausal hot flushes   6. Low vitamin D level  - Vitamin D (25 hydroxy)  The entirety of the information documented in the History of Present Illness, Review of Systems and Physical Exam were personally obtained by me. Portions of this information were initially documented by Lyndel Pleasure, CMA and reviewed by me for thoroughness and accuracy.   F/u 1 year   ------------------------------------------------------------------------------------------------------------    Trinna Post, PA-C  [facility]

## 2018-12-14 ENCOUNTER — Telehealth: Payer: Self-pay

## 2018-12-14 LAB — COMPREHENSIVE METABOLIC PANEL
ALT: 15 IU/L (ref 0–32)
AST: 23 IU/L (ref 0–40)
Albumin/Globulin Ratio: 1.7 (ref 1.2–2.2)
Albumin: 4.2 g/dL (ref 3.7–4.7)
Alkaline Phosphatase: 43 IU/L (ref 39–117)
BUN/Creatinine Ratio: 17 (ref 12–28)
BUN: 14 mg/dL (ref 8–27)
Bilirubin Total: 0.3 mg/dL (ref 0.0–1.2)
CO2: 24 mmol/L (ref 20–29)
Calcium: 10.2 mg/dL (ref 8.7–10.3)
Chloride: 96 mmol/L (ref 96–106)
Creatinine, Ser: 0.83 mg/dL (ref 0.57–1.00)
GFR calc Af Amer: 77 mL/min/{1.73_m2} (ref >59)
GFR calc non Af Amer: 67 mL/min/{1.73_m2} (ref >59)
Globulin, Total: 2.5 g/dL (ref 1.5–4.5)
Glucose: 82 mg/dL (ref 65–99)
Potassium: 4.5 mmol/L (ref 3.5–5.2)
Sodium: 133 mmol/L — ABNORMAL LOW (ref 134–144)
Total Protein: 6.7 g/dL (ref 6.0–8.5)

## 2018-12-14 LAB — CBC WITH DIFFERENTIAL/PLATELET
Basophils Absolute: 0 10*3/uL (ref 0.0–0.2)
Basos: 1 %
EOS (ABSOLUTE): 0.1 10*3/uL (ref 0.0–0.4)
Eos: 2 %
Hematocrit: 37.6 % (ref 34.0–46.6)
Hemoglobin: 13.3 g/dL (ref 11.1–15.9)
Immature Grans (Abs): 0 10*3/uL (ref 0.0–0.1)
Immature Granulocytes: 0 %
Lymphocytes Absolute: 1.3 10*3/uL (ref 0.7–3.1)
Lymphs: 26 %
MCH: 29.8 pg (ref 26.6–33.0)
MCHC: 35.4 g/dL (ref 31.5–35.7)
MCV: 84 fL (ref 79–97)
Monocytes Absolute: 0.4 10*3/uL (ref 0.1–0.9)
Monocytes: 7 %
Neutrophils Absolute: 3.4 10*3/uL (ref 1.4–7.0)
Neutrophils: 64 %
Platelets: 260 10*3/uL (ref 150–450)
RBC: 4.47 x10E6/uL (ref 3.77–5.28)
RDW: 12.6 % (ref 11.7–15.4)
WBC: 5.2 10*3/uL (ref 3.4–10.8)

## 2018-12-14 LAB — LIPID PANEL
Chol/HDL Ratio: 2.9 ratio (ref 0.0–4.4)
Cholesterol, Total: 239 mg/dL — ABNORMAL HIGH (ref 100–199)
HDL: 82 mg/dL (ref >39)
LDL Calculated: 133 mg/dL — ABNORMAL HIGH (ref 0–99)
Triglycerides: 122 mg/dL (ref 0–149)
VLDL Cholesterol Cal: 24 mg/dL (ref 5–40)

## 2018-12-14 LAB — VITAMIN D 25 HYDROXY (VIT D DEFICIENCY, FRACTURES): Vit D, 25-Hydroxy: 57.8 ng/mL (ref 30.0–100.0)

## 2018-12-14 LAB — TSH: TSH: 3.02 u[IU]/mL (ref 0.450–4.500)

## 2018-12-14 NOTE — Telephone Encounter (Signed)
-----   Message from Trinna Post, [place] sent at 12/14/2018  1:12 PM EDT ----- Worthy Keeler looks good. Cholesterol a touch high but I wouldn't recommend any medication for this.

## 2018-12-14 NOTE — Telephone Encounter (Signed)
LMTCB 12/14/2018  Thanks,   -Mickel Baas

## 2018-12-15 NOTE — Telephone Encounter (Signed)
Patient was notified of results. Expressed understanding.  

## 2018-12-21 DIAGNOSIS — Z85828 Personal history of other malignant neoplasm of skin: Secondary | ICD-10-CM | POA: Diagnosis not present

## 2018-12-21 DIAGNOSIS — Z08 Encounter for follow-up examination after completed treatment for malignant neoplasm: Secondary | ICD-10-CM | POA: Diagnosis not present

## 2018-12-21 DIAGNOSIS — D485 Neoplasm of uncertain behavior of skin: Secondary | ICD-10-CM | POA: Diagnosis not present

## 2018-12-21 DIAGNOSIS — L821 Other seborrheic keratosis: Secondary | ICD-10-CM | POA: Diagnosis not present

## 2018-12-21 DIAGNOSIS — C44729 Squamous cell carcinoma of skin of left lower limb, including hip: Secondary | ICD-10-CM | POA: Diagnosis not present

## 2018-12-28 ENCOUNTER — Ambulatory Visit
Admission: RE | Admit: 2018-12-28 | Discharge: 2018-12-28 | Disposition: A | Discharge status: Home / Self Care | Payer: PPO | Source: Ambulatory Visit | Attending: Physician Assistant | Admitting: Physician Assistant

## 2018-12-28 ENCOUNTER — Ambulatory Visit (INDEPENDENT_AMBULATORY_CARE_PROVIDER_SITE_OTHER): Payer: PPO | Admitting: Physician Assistant

## 2018-12-28 ENCOUNTER — Other Ambulatory Visit: Payer: Self-pay

## 2018-12-28 ENCOUNTER — Telehealth: Payer: Self-pay

## 2018-12-28 ENCOUNTER — Encounter: Payer: Self-pay | Admitting: Physician Assistant

## 2018-12-28 VITALS — BP 140/66 | Temp 98.5°F | Resp 15 | Wt 143.0 lb

## 2018-12-28 DIAGNOSIS — R600 Localized edema: Secondary | ICD-10-CM | POA: Diagnosis not present

## 2018-12-28 DIAGNOSIS — Z79899 Other long term (current) drug therapy: Secondary | ICD-10-CM

## 2018-12-28 DIAGNOSIS — M7989 Other specified soft tissue disorders: Secondary | ICD-10-CM

## 2018-12-28 NOTE — Telephone Encounter (Signed)
-----   Message from Trinna Post, [place] sent at 12/28/2018  5:02 PM EDT ----- Ultrasound negative for DVT. May be due to varicosities or venous dysfunction. Can try compression stockings in the morning and take off at night. May need referral to vascular surgery.

## 2018-12-28 NOTE — Progress Notes (Signed)
Patient: Wendy Patton Female    DOB: [birth year]   83 y.o.   MRN: [id number] Visit Date: 12/28/2018  Today's Provider: Trinna Post, PA-C   Chief Complaint  Patient presents with  . Foot Swelling   Subjective:     HPI Patient with a history significant for current estrogen therapy comes in office today with concerns of swelling of her left foot that has been occurring for the past 2-3 days. Patient reports that swelling was worse yesterday in her left foot and states that skin was tight. Reports she was told she may have some venous issues previously. Denies pain in her foot, coolness in her foot, numbness and tingling.   Allergies  Allergen Reactions  . Avelox [Moxifloxacin] Other (See Comments)    C.diff colitis   . Neurontin [Gabapentin] Diarrhea    abd cramping   . Nitrofurantoin Monohyd Macro Hives  . Percocet [Oxycodone-Acetaminophen] Hives  . Pulmicort Turbuhaler [Budesonide] Other (See Comments)    Blisters in mouth   . Amoxicillin Rash    Has patient had a PCN reaction causing immediate rash, facial/tongue/throat swelling, SOB or lightheadedness with hypotension: No Has patient had a PCN reaction causing severe rash involving mucus membranes or skin necrosis: No Has patient had a PCN reaction that required hospitalization: No Has patient had a PCN reaction occurring within the last 10 years: No If all of the above answers are "NO", then may proceed with Cephalosporin use.   . Cefdinir Rash  . Chlorhexidine Rash  . Ciprofloxacin Hcl Rash  . Clindamycin/Lincomycin Rash  . Doxycycline Rash  . Entex Hives and Rash  . Hydrocodone Rash  . Tavist-D [Albertsons Dayhist-D] Rash  . Tizanidine Rash  . [place] Rash     Current Outpatient Medications:  .  Calcium Carb-Cholecalciferol (CALCIUM 600 + D PO), Take 1 tablet by mouth 2 (two) times daily., Disp: , Rfl:  .  Cholecalciferol (VITAMIN D) 2000 units tablet, Take 2,000 Units by mouth daily., Disp:  , Rfl:  .  cycloSPORINE (RESTASIS) 0.05 % ophthalmic emulsion, 1 drop 2 (two) times daily., Disp: , Rfl:  .  estradiol (ESTRACE) 1 MG tablet, Take 1 tablet (1 mg total) by mouth 4 (four) times a week., Disp: 48 tablet, Rfl: 3 .  Glucosamine HCl 1000 MG TABS, Take 1,000 mg by mouth daily., Disp: , Rfl:  .  LOTEMAX SM 0.38 % GEL, , Disp: , Rfl:  .  pantoprazole (PROTONIX) 40 MG tablet, Take 1 tablet (40 mg total) by mouth 2 (two) times daily., Disp: 60 tablet, Rfl: 1 .  Polyethyl Glycol-Propyl Glycol (SYSTANE ULTRA) 0.4-0.3 % SOLN, Apply to eye., Disp: , Rfl:  .  sodium chloride (MURO 128) 2 % ophthalmic solution, 1 drop., Disp: , Rfl:  .  vitamin B-12 (CYANOCOBALAMIN) 1000 MCG tablet, Take 1,000 mcg by mouth daily., Disp: , Rfl:  .  vitamin E 1000 UNIT capsule, Take 1,000 Units by mouth daily.  , Disp: , Rfl:   Review of Systems  Constitutional: Negative.   Eyes: Negative.   Respiratory: Negative.   Cardiovascular: Negative.   Gastrointestinal: Negative.  Negative for abdominal distention.  Genitourinary: Negative.   Musculoskeletal: Positive for joint swelling. Negative for arthralgias.    Social History   Tobacco Use  . Smoking status: Never Smoker  . Smokeless tobacco: Never Used  Substance Use Topics  . Alcohol use: No    Alcohol/week: 0.0 standard drinks  Objective:   BP 140/66   Temp 98.5 F (36.9 C) (Oral)   Resp 15   Wt 143 lb (64.9 kg)   BMI 21.12 kg/m  Vitals:   12/28/18 1505  BP: 140/66  Resp: 15  Temp: 98.5 F (36.9 C)  TempSrc: Oral  Weight: 143 lb (64.9 kg)     Physical Exam Musculoskeletal:        General: Swelling present. No tenderness or deformity.     Right lower leg: No edema.     Left lower leg: Edema present.     Comments: Good capillary refill of feet bilaterally. Left extremity is mildly swollen but does not have tenderness in the deep venous system. Skin is dry and peeling 2/2 ichthyosis.   Neurological:     Mental Status: She is  alert and oriented to person, place, and time. Mental status is at baseline.         Assessment & Plan    1. Left leg swelling  Elderly patient on chronic estrogen therapy. Concerning for DVT, will order as below. If negative, likely venous dysfunction.   I have reviewed ultrasound and it is negative for DVT. I will have patient where compression stockings and refer to vascular.   - US Venous Img Lower Unilateral Left; Future  2. Current use of estrogen therapy  - US Venous Img Lower Unilateral Left; Future  The entirety of the information documented in the History of Present Illness, Review of Systems and Physical Exam were personally obtained by me. Portions of this information were initially documented by Jennings Books, CMA and reviewed by me for thoroughness and accuracy.   F/u PRN.       Trinna Post, PA-C  [facility] Fritzi Mandes Spurgeon as a scribe for Trinna Post, PA-C.,have documented all relevant documentation on the behalf of Trinna Post, PA-C,as directed by  Trinna Post, PA-C while in the presence of Trinna Post, PA-C.

## 2018-12-28 NOTE — Telephone Encounter (Signed)
LMTCB 12/28/2018  Thanks,   -Wendy Patton  

## 2018-12-29 DIAGNOSIS — C44729 Squamous cell carcinoma of skin of left lower limb, including hip: Secondary | ICD-10-CM | POA: Diagnosis not present

## 2018-12-29 NOTE — Telephone Encounter (Signed)
Left message to call back  

## 2018-12-29 NOTE — Telephone Encounter (Signed)
Vascular referral placed.

## 2018-12-29 NOTE — Telephone Encounter (Signed)
Patient would like the referral go to [facility] or [facility] in [place] with Vascular and [facility] specialists.

## 2018-12-29 NOTE — Patient Instructions (Signed)

## 2019-01-02 ENCOUNTER — Other Ambulatory Visit: Payer: Self-pay | Admitting: *Deleted

## 2019-01-02 NOTE — Patient Outreach (Signed)
[facility]) Care Management [place] screening outreach documentation- insurance referral 01/02/2019  Wendy Patton 02/27/1936 [id number]  Follow up from [facility] HRA screening phone call placed to patient on November 08, 2018; HIPAA/ identity verified.  Screening call completed with patient; patient reports no chronic conditions and denies ongoing needs, concerns/ issues.  No concerns or needs were identified during screening call   Plan:  Patient to be followed in HRA engaged program  Verified patient was mailed a patient welcome letter on November 08, 2018  Oneta Rack, RN, BSN, [employer] Coordinator [facility] Care Management  [phone]

## 2019-01-10 ENCOUNTER — Telehealth: Payer: Self-pay

## 2019-01-10 NOTE — Telephone Encounter (Signed)
I'm not quite sure what 800-53 is, if it's a diagnostic code she can bring in the paperwork.

## 2019-01-10 NOTE — Telephone Encounter (Signed)
Patient called and stated that her insurance company send out a letter stating that they denied bloodwork that was drawn on 12/13/2018. Patient states that they are trying to charge her $89.00 for the bloodwork and states that the only way it could be cover if Adriana includes 242-68 into the metabolic panel. Patient states she will bring the document up here if Fabio Bering needs to see it to get full understanding of what they are asking for. Please advise.

## 2019-01-10 NOTE — Telephone Encounter (Signed)
Patient reports she will mail the letter to [facility]

## 2019-01-12 DIAGNOSIS — M1611 Unilateral primary osteoarthritis, right hip: Secondary | ICD-10-CM | POA: Diagnosis not present

## 2019-01-12 DIAGNOSIS — M25551 Pain in right hip: Secondary | ICD-10-CM | POA: Diagnosis not present

## 2019-01-17 ENCOUNTER — Other Ambulatory Visit: Payer: Self-pay | Admitting: *Deleted

## 2019-01-17 NOTE — Patient Outreach (Signed)
[facility]) Care Management [facility] CM Case Closure note Patient active with external program 01/17/2019  Wendy Patton 07/17/1936 [id number]  [facility] Care Management Case Closure note from previously placed [facility] HRA screening phone call  Received confirmation from [place] leadership team that patient is now active with an external program  Plan:  Will close patient case and make patient inactive with [facility] CM  Oneta Rack, RN, BSN, [place] Coordinator [facility] Care Management  [phone]

## 2019-01-20 ENCOUNTER — Telehealth: Payer: Self-pay | Admitting: Physician Assistant

## 2019-01-20 NOTE — Telephone Encounter (Signed)
Changed diagnosis code for disputed lab. Will resubmit.

## 2019-01-23 ENCOUNTER — Telehealth: Payer: Self-pay | Admitting: Physician Assistant

## 2019-01-23 NOTE — Telephone Encounter (Signed)
Was new diagnosis called to [facility] for CMET?

## 2019-01-23 NOTE — Telephone Encounter (Signed)
-----   Message from Sandie Ano sent at 01/23/2019  8:16 AM EDT ----- New diagnosis code needs to be called to [place] if this is regarding labs.

## 2019-01-24 NOTE — Telephone Encounter (Signed)
R79.89 elevated serum creatinine.

## 2019-01-24 NOTE — Telephone Encounter (Signed)
What is the new diagnosis code?

## 2019-01-25 NOTE — Telephone Encounter (Signed)
done

## 2019-02-06 DIAGNOSIS — H16223 Keratoconjunctivitis sicca, not specified as Sjogren's, bilateral: Secondary | ICD-10-CM | POA: Diagnosis not present

## 2019-02-22 DIAGNOSIS — H6122 Impacted cerumen, left ear: Secondary | ICD-10-CM | POA: Diagnosis not present

## 2019-02-22 DIAGNOSIS — D481 Neoplasm of uncertain behavior of connective and other soft tissue: Secondary | ICD-10-CM | POA: Diagnosis not present

## 2019-02-23 ENCOUNTER — Other Ambulatory Visit: Payer: Self-pay | Admitting: Physician Assistant

## 2019-02-23 DIAGNOSIS — R350 Frequency of micturition: Secondary | ICD-10-CM

## 2019-02-23 DIAGNOSIS — Z780 Asymptomatic menopausal state: Secondary | ICD-10-CM

## 2019-02-23 DIAGNOSIS — N952 Postmenopausal atrophic vaginitis: Secondary | ICD-10-CM

## 2019-02-23 MED ORDER — ESTRADIOL 1 MG PO TABS: 1.0000 mg | ORAL_TABLET | ORAL | 3 refills | Status: DC

## 2019-02-23 NOTE — Telephone Encounter (Signed)
[facility] faxed refill request for the following medications:  estradiol (ESTRACE) 1 MG tablet  90 day supply Last Rx: 08/04/2018 LOV: 12/28/2018 Please advise. Thanks TNP

## 2019-02-24 DIAGNOSIS — M25551 Pain in right hip: Secondary | ICD-10-CM | POA: Diagnosis not present

## 2019-02-28 ENCOUNTER — Encounter (HOSPITAL_COMMUNITY): Payer: PPO

## 2019-02-28 ENCOUNTER — Encounter: Payer: PPO | Admitting: Vascular Surgery

## 2019-03-20 ENCOUNTER — Telehealth: Payer: Self-pay | Admitting: Cardiovascular Disease

## 2019-03-20 NOTE — Telephone Encounter (Signed)
° °  [facility] Pre-operative Risk Assessment    Request for surgical clearance:  1. What type of surgery is being performed? Right anterior hip arthroplasty   2. When is this surgery scheduled? TBD   3. What type of clearance is required (medical clearance vs. Pharmacy clearance to hold med vs. Both)? Both   4. Are there any medications that need to be held prior to surgery and how long? Any anticoagulation recommendations if needed   5. Practice name and name of physician performing surgery? [facility] - Dr Monico Blitz. [place]   6. What is your office phone number [phone]    7.   What is your office fax number [phone] - Marlin Canary   8.   Anesthesia type (None, local, MAC, general) ? Spinal anesthesia    Ace Gins 03/20/2019, 4:08 PM  _________________________________________________________________   (provider comments below)

## 2019-03-20 NOTE — Telephone Encounter (Signed)
Spoke with Wendy Patton who is schedule to see [name] on 9/10 at 9:30 am. Wendy Patton verbalized understanding and thanked me for the call.

## 2019-03-20 NOTE — Telephone Encounter (Signed)
   Primary Cardiologist:Dr. Rockey Situ Chart reviewed as part of pre-operative protocol coverage. Because of Wendy Patton's past medical history and time since last visit, he/she will require a follow-up visit in order to better assess preoperative cardiovascular risk.  Pre-op covering staff: - Please schedule appointment and call patient to inform them. - Please contact requesting surgeon's office via preferred method (i.e, phone, fax) to inform them of need for appointment prior to surgery.  If applicable, this message will also be routed to pharmacy pool and/or primary cardiologist for input on holding anticoagulant/antiplatelet agent as requested below so that this information is available at time of patient's appointment.   [place], [place]  03/20/2019, 4:35 PM

## 2019-03-21 ENCOUNTER — Telehealth: Payer: Self-pay | Admitting: Physician Assistant

## 2019-03-21 NOTE — Telephone Encounter (Signed)
Per Wells Guiles with Dr. Erling Conte reports that patient needs surgical clearance with PCP.

## 2019-03-21 NOTE — Telephone Encounter (Signed)
Tried calling pt to schedule pre-op visit. Line was busy, will try again later.

## 2019-03-21 NOTE — Telephone Encounter (Signed)
[place] Dr. Marlowe Sax.

## 2019-03-21 NOTE — Telephone Encounter (Signed)
Surgical clearance received. Looks like she has an appointment with Dr. Rockey Situ for cardiac clearance. If she needs to see me for medical clearance, please schedule OV.

## 2019-03-23 ENCOUNTER — Telehealth: Payer: Self-pay | Admitting: *Deleted

## 2019-03-23 NOTE — Telephone Encounter (Signed)
Patient called back stating that she already has an appointment scheduled with [facility] for a surgical clearance on 04/05/2019.

## 2019-03-23 NOTE — Telephone Encounter (Signed)
LMOVM for pt to cb to schedule a pre-op appt. Form is on Michelle's desk.

## 2019-03-29 ENCOUNTER — Other Ambulatory Visit: Payer: Self-pay | Admitting: Orthopaedic Surgery

## 2019-03-31 ENCOUNTER — Other Ambulatory Visit: Payer: Self-pay

## 2019-03-31 DIAGNOSIS — R600 Localized edema: Secondary | ICD-10-CM

## 2019-04-04 ENCOUNTER — Ambulatory Visit (HOSPITAL_COMMUNITY)
Admission: RE | Admit: 2019-04-04 | Discharge: 2019-04-04 | Disposition: A | Discharge status: Home / Self Care | Payer: PPO | Source: Ambulatory Visit | Attending: Family | Admitting: Family

## 2019-04-04 ENCOUNTER — Ambulatory Visit (INDEPENDENT_AMBULATORY_CARE_PROVIDER_SITE_OTHER): Payer: PPO | Admitting: Vascular Surgery

## 2019-04-04 ENCOUNTER — Other Ambulatory Visit: Payer: Self-pay

## 2019-04-04 ENCOUNTER — Encounter: Payer: Self-pay | Admitting: Vascular Surgery

## 2019-04-04 VITALS — BP 137/78 | HR 78 | Temp 97.7°F | Resp 20 | Ht 69.0 in | Wt 139.0 lb

## 2019-04-04 DIAGNOSIS — R600 Localized edema: Secondary | ICD-10-CM

## 2019-04-04 NOTE — Progress Notes (Signed)
[facility] [place]  Patient name: Wendy Patton MRN: [id number] DOB: [birth year] Sex: female  REASON FOR CONSULT: Evaluation of bilateral lower extremity swelling left greater than right  HPI: Wendy Patton is a 83 y.o. female, who is here today for evaluation.  She is well-known to me from my prior treatment of her husband for vascular issues.  She reports swelling in her left distal calf and into her foot.  Reports this is also involving her right leg is now as well.  She did undergo venous duplex showing no evidence of DVT.  She is undergoing a reflux study today as well.  She does have a history of orthopedic issues with prior left hip replacement and has scheduled for a right hip replacement in 2 weeks.  She has no history of peripheral vascular occlusive disease.  No history of DVT.  Past Medical History:  Diagnosis Date  . Anemia    after jaw surgery was on iron for short period of time  . Arthritis   . Cancer (HCC)    squamous cell  . Difficult intubation    potential for difficult airway due to limited mouth opening  . Dry skin   . GERD (gastroesophageal reflux disease)    takes Aciphex daily  . Headache(784.0)    hx of migraines   . Heart murmur   . History of migraine    last one Aug 2014 and takes Imitrex daily as needed  . History of staph infection   . IBS (irritable bowel syndrome)   . Joint pain   . Joint swelling   . Laryngopharyngeal reflux   . MVP (mitral valve prolapse)   . Neuralgia   . Neuropathy    takes gabapentin daily  . PONV (postoperative nausea and vomiting)    TMJ surgery several times  . Rheumatic fever    as a child  . Shortness of breath    with exertion  . Thyroid disease     Family History  Problem Relation Age of Onset  . Heart attack Maternal Grandmother   . Breast cancer Neg Hx     SOCIAL HISTORY: Social History   Socioeconomic History  . Marital status:  Married    Spouse name: Not on file  . Number of children: Not on file  . Years of education: Not on file  . Highest education level: Not on file  Occupational History  . Not on file  Social Needs  . Financial resource strain: Not on file  . Food insecurity    Worry: Not on file    Inability: Not on file  . Transportation needs    Medical: Not on file    Non-medical: Not on file  Tobacco Use  . Smoking status: Never Smoker  . Smokeless tobacco: Never Used  Substance and Sexual Activity  . Alcohol use: No    Alcohol/week: 0.0 standard drinks  . Drug use: No  . Sexual activity: Yes    Birth control/protection: Surgical  Lifestyle  . Physical activity    Days per week: Not on file    Minutes per session: Not on file  . Stress: Not on file  Relationships  . Social Herbalist on phone: Not on file    Gets together: Not on file    Attends religious service: Not on file    Active member of club or organization: Not on file    Attends meetings  of clubs or organizations: Not on file    Relationship status: Not on file  . Intimate partner violence    Fear of current or ex partner: Not on file    Emotionally abused: Not on file    Physically abused: Not on file    Forced sexual activity: Not on file  Other Topics Concern  . Not on file  Social History Narrative  . Not on file    Allergies  Allergen Reactions  . Avelox [Moxifloxacin] Other (See Comments)    C.diff colitis   . Neurontin [Gabapentin] Diarrhea    abd cramping   . Nitrofurantoin Monohyd Macro Hives  . Percocet [Oxycodone-Acetaminophen] Hives  . Pulmicort Turbuhaler [Budesonide] Other (See Comments)    Blisters in mouth   . Amoxicillin Rash    Has patient had a PCN reaction causing immediate rash, facial/tongue/throat swelling, SOB or lightheadedness with hypotension: No Has patient had a PCN reaction causing severe rash involving mucus membranes or skin necrosis: No Has patient had a PCN  reaction that required hospitalization: No Has patient had a PCN reaction occurring within the last 10 years: No If all of the above answers are "NO", then may proceed with Cephalosporin use.   . Cefdinir Rash  . Chlorhexidine Rash  . Ciprofloxacin Hcl Rash  . Clindamycin/Lincomycin Rash  . Doxycycline Rash  . Entex Hives and Rash  . Hydrocodone Rash  . Tavist-D [Albertsons Dayhist-D] Rash  . Tizanidine Rash  . Witch Hazel Rash    Current Outpatient Medications  Medication Sig Dispense Refill  . Calcium Carb-Cholecalciferol (CALCIUM 600 + D PO) Take 1 tablet by mouth 2 (two) times daily.    . Cholecalciferol (VITAMIN D) 2000 units tablet Take 2,000 Units by mouth daily.    . cycloSPORINE (RESTASIS) 0.05 % ophthalmic emulsion     . estradiol (ESTRACE) 1 MG tablet Take 1 tablet (1 mg total) by mouth 4 (four) times a week. 48 tablet 3  . Glucosamine HCl 1000 MG TABS Take 1,000 mg by mouth daily.    . Omega-3 1000 MG CAPS Take 1,000 mg by mouth.    . pantoprazole (PROTONIX) 40 MG tablet Take 1 tablet (40 mg total) by mouth 2 (two) times daily. 60 tablet 1  . Propylene Glycol (SYSTANE COMPLETE OP) Apply to eye.    . sodium chloride (MURO 128) 2 % ophthalmic solution 1 drop.    . vitamin B-12 (CYANOCOBALAMIN) 1000 MCG tablet Take 1,000 mcg by mouth daily.    . vitamin E 1000 UNIT capsule Take 1,000 Units by mouth daily.       No current facility-administered medications for this visit.     REVIEW OF SYSTEMS:  [X]  denotes positive finding, [ ]  denotes negative finding Cardiac  Comments:  Chest pain or chest pressure:    Shortness of breath upon exertion: x   Short of breath when lying flat:    Irregular heart rhythm:        Vascular    Pain in calf, thigh, or hip brought on by ambulation:    Pain in feet at night that wakes you up from your sleep:     Blood clot in your veins:    Leg swelling:  x       Pulmonary    Oxygen at home:    Productive cough:     Wheezing:          Neurologic    Sudden weakness in arms or legs:  Sudden numbness in arms or legs:     Sudden onset of difficulty speaking or slurred speech:    Temporary loss of vision in one eye:     Problems with dizziness:         Gastrointestinal    Blood in stool:     Vomited blood:         Genitourinary    Burning when urinating:     Blood in urine:        Psychiatric    Major depression:         Hematologic    Bleeding problems:    Problems with blood clotting too easily:        Skin    Rashes or ulcers:        Constitutional    Fever or chills:      PHYSICAL EXAM: Vitals:   04/04/19 1356  BP: 137/78  Pulse: 78  Resp: 20  Temp: 97.7 F (36.5 C)  TempSrc: Temporal  SpO2: 97%  Weight: 63 kg  Height: 5\' 9"  (1.753 m)    GENERAL: The patient is a well-nourished female, in no acute distress. The vital signs are documented above. CARDIOVASCULAR: 2+ dorsalis pedis pulses bilaterally.  No evidence of venous varicosities.  Mild lower extremity edema bilaterally left greater than right PULMONARY: There is good air exchange  ABDOMEN: Soft and non-tender  MUSCULOSKELETAL: There are no major deformities or cyanosis. NEUROLOGIC: No focal weakness or paresthesias are detected. SKIN: There are no ulcers or rashes noted. PSYCHIATRIC: The patient has a normal affect.  DATA:  Reflux study today on the left leg shows no evidence of reflux in her deep system and no evidence of dilatation or reflux in her great saphenous vein  MEDICAL ISSUES: I reassured patient with these findings.  She does not have any evidence of DVT or significant lower extremity reflux.  She has tried compression garments in the past and these have been very uncomfortable to her feeling is either too constrictive at her ankle and top of the garments.  With her mild to moderate swelling I would recommend elevation only.  She does have some difficulty with this as well since she does have reflux and elevates the head  of her bed at night.  I would continue to treat her symptomatically.  Would consider diuretic therapy if her renal function remains normal.  She was reassured with this discussion will see [place] again on an as-needed basis   Rosetta Posner, MD [facility] Specialists of [facility] Tel [phone] Pager [phone]

## 2019-04-05 ENCOUNTER — Ambulatory Visit (INDEPENDENT_AMBULATORY_CARE_PROVIDER_SITE_OTHER): Payer: PPO | Admitting: Physician Assistant

## 2019-04-05 ENCOUNTER — Encounter: Payer: Self-pay | Admitting: Physician Assistant

## 2019-04-05 VITALS — BP 142/77 | HR 67 | Temp 96.9°F | Resp 16 | Ht 69.0 in | Wt 137.4 lb

## 2019-04-05 DIAGNOSIS — Z01818 Encounter for other preprocedural examination: Secondary | ICD-10-CM

## 2019-04-05 DIAGNOSIS — R7309 Other abnormal glucose: Secondary | ICD-10-CM

## 2019-04-05 DIAGNOSIS — Z23 Encounter for immunization: Secondary | ICD-10-CM | POA: Diagnosis not present

## 2019-04-05 LAB — POCT GLYCOSYLATED HEMOGLOBIN (HGB A1C): Hemoglobin A1C: 5.3 % (ref 4.0–5.6)

## 2019-04-05 NOTE — Patient Instructions (Signed)
Hip Rehabilitation in the Home After hip surgery, it is important to follow instructions from your health care provider about rehabilitation (rehab). It is important to design a program that is safe and effective for you. Your health care provider and rehab therapist will work with you to meet your specific abilities and needs. What are the benefits? Hip rehab can help to:  Strengthen your hip.  Improve the flexibility and movement (range of motion) of your hip joint.  Reduce swelling.  Improve blood flow and prevent blood clots. How to do exercises at home   Continue exercises at home that your health care provider or rehab therapist instructed you to do in the hospital.  Before you exercise: ? Take pain medicines, if told by your health care provider. Do not take the medicine if it makes you feel dizzy or sleepy. ? Do a warm-up activity, such as gentle walking, as told by your health care provider. This warms up your muscles and helps prevent injury.  While doing exercises: ? When standing, make sure you are near something sturdy that you can hold onto for balance, such as a heavy chair or a wall. ? Do exercises exactly as told by your health care provider and adjust them as directed. ? As you are recovering, choose an exercise pace that is comfortable for you, and gradually work up to your goal. ? Follow activity restrictions as told by your health care provider. This may vary depending on the type of hip surgery you had. For example, your health care provider may instruct you to:  Not bring your knees higher than your hips.  Not cross your legs.  Not twist while lying down or standing.  Avoid rotating the toes of your affected leg inward. Keep your toes pointing straight ahead.  Do not exercise in a pool (aquatic therapy) until your incision from surgery is healed and your health care provider says that you can. Follow these instructions at home: Activity  Do not use your  affected leg to support your body weight until your health care provider says that you can. Use crutches or a walker as told by your health care provider.  Ask your health care provider what activities are safe for you during recovery, and ask what activities you need to avoid.  Avoid sitting for a long time without moving. Get up to take short walks every 1-2 hours. Ask for help if you feel weak or unsteady. Managing pain, stiffness, and swelling   Put ice on affected areas after you exercise, or as needed. Icing can help to relieve joint pain and swelling. ? Put ice in a plastic bag that you were given. ? Place a towel between your skin and the bag. ? Leave the ice on for 20 minutes, 2-3 times a day.  If directed, apply heat to affected areas before you exercise, or as needed. Heat can reduce muscle and joint stiffness. Use the heat source that your health care provider recommends, such as a moist heat pack or a heating pad. ? Place a towel between your skin and the heat source. ? Leave the heat on for 20-30 minutes. ? Remove the heat if your skin turns bright red. This is especially important if you are unable to feel pain, heat, or cold. You may have a greater risk of getting burned.  Wear compression stockings as told by your health care provider. These stockings help to prevent blood clots and reduce swelling in your legs.    Raise (elevate) your legs while sitting or lying down. Preventing falls  Keep your home well-lit and clutter-free, especially in walkways and stairways. Keep floors dry and use non-skid mats.  Remove tripping hazards from floors, such as throw rugs and cords.  Install grab bars in bathrooms, and put night-lights in your bedroom and bathroom.  Wear closed-toe shoes that fit well and support your feet. Wear shoes that have rubber soles or low heels.  Talk with your health care provider about the over-the-counter and prescription medicines that you are taking.  Some medicines may increase your risk of falling. General recommendations  Teach your family about your condition and how they can participate in your recovery. Try bringing your family members with you to a physical therapy session.  Take over-the-counter and prescription medicines only as told by your health care provider.  Keep all follow-up visits as told by your health care provider and rehab therapist. This is important. Questions to ask your health care provider  What exercises are safe for me to do?  How often should I do the exercises?  How can I manage pain during exercise?  What other activities are safe for me to do? Contact a health care provider if:  You have questions about how to do exercises correctly.  You have hip or groin pain that does not go away after resting and taking pain medicine.  Your artificial hip (prosthesis) feels loose.  You are not able to do exercises. Get help right away if:  You fall.  Your incision from surgery breaks open.  Your affected leg is shorter than the other leg, and this is a new problem. Summary  Hip rehab can help to strengthen your hip and improve the flexibility and movement of your hip joint.  Continue exercises at home that your health care provider or physical therapist instructed you to do in the hospital.  Contact your health care provider if you have questions about how to do exercises correctly. This information is not intended to replace advice given to you by your health care provider. Make sure you discuss any questions you have with your health care provider. Document Released: 02/21/2004 Document Revised: 07/05/2017 Document Reviewed: 07/05/2017 Elsevier Patient Education  2020 [employer].

## 2019-04-05 NOTE — Progress Notes (Signed)
Patient: Wendy Patton Female    DOB: 25-Jul-1936   83 y.o.   MRN: [id number] Visit Date: 04/05/2019  Today's Provider: Trinna Post, PA-C   Chief Complaint  Patient presents with  . Pre-op Exam   Subjective:    Patient presenting today for medical clearance of right hip arthroplasty on 04/18/2019 with [facility]. She has had her left hip replaced by the same physician. She is in her usual state of health with no recent fevers, respiratory illnesses. She is to undergo procedure with spinal anesthesia. She has been anesthetized similarly before and has had no issues. She does not have sleep apnea. She is not on blood thinners. She reports she may be slightly difficult to intubate due to prior jaw surgery. She is on estrace 1 mg every other day for UTI prophylaxis after failing other more conservative measures. She has no chronic health conditions. She has an appointment on 04/13/2019 with cardiology for cardiac clearance.  Allergies  Allergen Reactions  . Avelox [Moxifloxacin] Other (See Comments)    C.diff colitis   . Neurontin [Gabapentin] Diarrhea    abd cramping   . Nitrofurantoin Monohyd Macro Hives  . Percocet [Oxycodone-Acetaminophen] Hives  . Pulmicort Turbuhaler [Budesonide] Other (See Comments)    Blisters in mouth   . Amoxicillin Rash    Has patient had a PCN reaction causing immediate rash, facial/tongue/throat swelling, SOB or lightheadedness with hypotension: No Has patient had a PCN reaction causing severe rash involving mucus membranes or skin necrosis: No Has patient had a PCN reaction that required hospitalization: No Has patient had a PCN reaction occurring within the last 10 years: No If all of the above answers are "NO", then may proceed with Cephalosporin use.   . Cefdinir Rash  . Chlorhexidine Rash  . Ciprofloxacin Hcl Rash  . Clindamycin/Lincomycin Rash  . Doxycycline Rash  . Entex Hives and Rash  . Hydrocodone Rash  . Tavist-D  [Albertsons Dayhist-D] Rash  . Tizanidine Rash  . Witch Hazel Rash     Current Outpatient Medications:  .  Calcium Carb-Cholecalciferol (CALCIUM 600 + D PO), Take 1 tablet by mouth 2 (two) times daily., Disp: , Rfl:  .  Cholecalciferol (VITAMIN D) 2000 units tablet, Take 2,000 Units by mouth daily., Disp: , Rfl:  .  cycloSPORINE (RESTASIS) 0.05 % ophthalmic emulsion, , Disp: , Rfl:  .  estradiol (ESTRACE) 1 MG tablet, Take 1 tablet (1 mg total) by mouth 4 (four) times a week., Disp: 48 tablet, Rfl: 3 .  Glucosamine HCl 1000 MG TABS, Take 1,000 mg by mouth daily., Disp: , Rfl:  .  Omega-3 1000 MG CAPS, Take 1,000 mg by mouth., Disp: , Rfl:  .  pantoprazole (PROTONIX) 40 MG tablet, Take 1 tablet (40 mg total) by mouth 2 (two) times daily., Disp: 60 tablet, Rfl: 1 .  Propylene Glycol (SYSTANE COMPLETE OP), Apply to eye., Disp: , Rfl:  .  sodium chloride (MURO 128) 2 % ophthalmic solution, 1 drop., Disp: , Rfl:  .  vitamin B-12 (CYANOCOBALAMIN) 1000 MCG tablet, Take 1,000 mcg by mouth daily., Disp: , Rfl:  .  vitamin E 1000 UNIT capsule, Take 1,000 Units by mouth daily.  , Disp: , Rfl:   Review of Systems  Constitutional: Negative.   Respiratory: Negative.   Cardiovascular: Negative.     Social History   Tobacco Use  . Smoking status: Never Smoker  . Smokeless tobacco: Never Used  Substance Use  Topics  . Alcohol use: No    Alcohol/week: 0.0 standard drinks      Objective:   BP (!) 142/77 (BP Location: Left Arm, Patient Position: Sitting, Cuff Size: Normal)   Pulse 67   Temp (!) 96.9 F (36.1 C) (Temporal)   Resp 16   Ht 5\' 9"  (1.753 m)   Wt 137 lb 6.4 oz (62.3 kg)   SpO2 97%   BMI 20.29 kg/m  Vitals:   04/05/19 1123  BP: (!) 142/77  Pulse: 67  Resp: 16  Temp: (!) 96.9 F (36.1 C)  TempSrc: Temporal  SpO2: 97%  Weight: 137 lb 6.4 oz (62.3 kg)  Height: 5\' 9"  (1.753 m)  Body mass index is 20.29 kg/m.   Physical Exam Constitutional:      Appearance: Normal  appearance.  Cardiovascular:     Rate and Rhythm: Normal rate and regular rhythm.     Heart sounds: Normal heart sounds.  Pulmonary:     Effort: Pulmonary effort is normal.     Breath sounds: Normal breath sounds.  Skin:    General: Skin is warm and dry.  Neurological:     Mental Status: She is alert and oriented to person, place, and time. Mental status is at baseline.  Psychiatric:        Mood and Affect: Mood normal.        Behavior: Behavior normal.      No results found for any visits on 04/05/19.     Assessment & Plan    1. Pre-operative clearance  Patient clear for surgery. She has been advised that she should stop her estrace today, last dose 04/04/2019. This will give her two weeks off the estrogen prior to surgery. Counseled on importance of this to reduce risk of VTE. She understands. Have counseled she should not restart this until she is reliably mobile and not bed-bound.   2. Need for influenza vaccination  - Flu Vaccine QUAD High Dose(Fluad)  3. Abnormal blood sugar  - POCT glycosylated hemoglobin (Hb A1C)  The entirety of the information documented in the History of Present Illness, Review of Systems and Physical Exam were personally obtained by me. Portions of this information were initially documented by Lynford Humphrey, CMA and reviewed by me for thoroughness and accuracy.       Trinna Post, PA-C  [facility]

## 2019-04-06 ENCOUNTER — Telehealth: Payer: Self-pay | Admitting: Physician Assistant

## 2019-04-06 NOTE — Telephone Encounter (Signed)
FYI

## 2019-04-06 NOTE — Telephone Encounter (Signed)
Noted, glad she got it settled.

## 2019-04-06 NOTE — Telephone Encounter (Signed)
Pt called saying she had talked with Fabio Bering about her insurance not paying for some lab test that had been ordered from out office.  She said she talked to [place] and they told her it was not our offices fault but their fault.  Pt just wanted to let you know

## 2019-04-12 NOTE — Patient Instructions (Addendum)
DUE TO COVID-19 ONLY ONE VISITOR IS ALLOWED TO COME WITH YOU AND STAY IN THE WAITING ROOM ONLY DURING PRE OP AND PROCEDURE DAY OF SURGERY . THE 1 VISITOR MAY VISIT WITH YOU AFTER SURGERY IN YOUR PRIVATE ROOM DURING VISITING HOURS ONLY!  YOU NEED TO HAVE A COVID 19 TEST ON_Friday 9/11______ @___11 :30____, THIS TEST MUST BE DONE BEFORE SURGERY, [address]  ONCE YOUR COVID TEST IS COMPLETED, PLEASE BEGIN THE QUARANTINE INSTRUCTIONS AS OUTLINED IN YOUR HANDOUT.                Wendy Patton    Your procedure is scheduled on: Tuesday 04/18/19   Report to [facility] Main  Entrance   Report to admitting at    9:50 AM     Call this number if you have problems the morning of surgery [place], NO [place].   Do not eat food After Midnight.   YOU MAY HAVE CLEAR LIQUIDS FROM MIDNIGHT UNTIL 9:15 AM.   At 9:15 AM Please finish the prescribed Pre-Surgery  drink.  Nothing by mouth after you finish the  drink !   Take these medicines the morning of surgery with A SIP OF WATER: Protonix    CLEAR LIQUID DIET   Foods Allowed                                                                     Foods Excluded  Coffee and tea, regular and decaf                             liquids that you cannot  Plain Jell-O any favor except red or purple                                           see through such as: Fruit ices (not with fruit pulp)                                     milk, soups, orange juice  Iced Popsicles                                    All solid food Carbonated beverages, regular and diet                                    Cranberry, grape and apple juices Sports drinks like Gatorade Lightly seasoned clear broth or consume(fat free) Sugar, honey syru _____________________________________________________________________                                  Wendy Patton may not have any metal  on your body including hair pins and  Piercings             Do not wear jewelry, make-up, lotions, powders or perfumes, deodorant             Do not wear nail polish.  Do not shave  48 hours prior to surgery.             Do not bring valuables to the hospital. [place].  Contacts, dentures or bridgework may not be worn into surgery.       Special Instructions: N/A              Please read over the following fact sheets you were given: _____________________________________________________________________             [facility] - Preparing for Surgery  Before surgery, you can play an important role.   Because skin is not sterile, your skin needs to be as free of germs as possible.   You can reduce the number of germs on your skin by washing with CHG (chlorahexidine gluconate) soap before surgery.   CHG is an antiseptic cleaner which kills germs and bonds with the skin to continue killing germs even after washing. Please DO NOT use if you have an allergy to CHG or antibacterial soaps .  If your skin becomes reddened/irritated stop using the CHG and inform your nurse when you arrive at Short Stay. Do not shave (including legs and underarms) for at least 48 hours prior to the first CHG shower  .  Please follow these instructions carefully:  1.  Shower with CHG Soap the night before surgery and the  morning of Surgery.  2.  If you choose to wash your hair, wash your hair first as usual with your  normal  shampoo.  3.  After you shampoo, rinse your hair and body thoroughly to remove the  shampoo.                                        4.  Use CHG as you would any other liquid soap.  You can apply chg directly  to the skin and wash                       Gently with a scrungie or clean washcloth.  5.  Apply the CHG Soap to your body ONLY FROM THE NECK DOWN.   Do not use on face/ open                           Wound or open  sores. Avoid contact with eyes, ears mouth and genitals (private parts).                       Wash face,  Genitals (private parts) with your normal soap.             6.  Wash thoroughly, paying special attention to the area where your surgery  will be performed.  7.  Thoroughly rinse your body with warm water from the neck down.  8.  DO NOT shower/wash with your normal soap after using and rinsing off  the CHG Soap.  9.  Pat yourself dry with a clean towel.            10.  Wear clean pajamas.            11.  Place clean sheets on your bed the night of your first shower and do not  sleep with pets. Day of Surgery : Do not apply any lotions/deodorants the morning of surgery.  Please wear clean clothes to the hospital/surgery center.   FAILURE TO FOLLOW THESE INSTRUCTIONS MAY RESULT IN THE CANCELLATION OF YOUR SURGERY PATIENT SIGNATURE_________________________________  NURSE SIGNATURE__________________________________  ________________________________________________________________________   [name]  An incentive spirometer is a tool that can help keep your lungs clear and active. This tool measures how well you are filling your lungs with each breath. Taking long deep breaths may help reverse or decrease the chance of developing breathing (pulmonary) problems (especially infection) following:  A long period of time when you are unable to move or be active. BEFORE THE PROCEDURE   If the spirometer includes an indicator to show your best effort, your nurse or respiratory therapist will set it to a desired goal.  If possible, sit up straight or lean slightly forward. Try not to slouch.  Hold the incentive spirometer in an upright position. INSTRUCTIONS FOR USE  1. Sit on the edge of your bed if possible, or sit up as far as you can in bed or on a chair. 2. Hold the incentive spirometer in an upright position. 3. Breathe out normally. 4. Place the mouthpiece in  your mouth and seal your lips tightly around it. 5. Breathe in slowly and as deeply as possible, raising the piston or the ball toward the top of the column. 6. Hold your breath for 3-5 seconds or for as long as possible. Allow the piston or ball to fall to the bottom of the column. 7. Remove the mouthpiece from your mouth and breathe out normally. 8. Rest for a few seconds and repeat Steps 1 through 7 at least 10 times every 1-2 hours when you are awake. Take your time and take a few normal breaths between deep breaths. 9. The spirometer may include an indicator to show your best effort. Use the indicator as a goal to work toward during each repetition. 10. After each set of 10 deep breaths, practice coughing to be sure your lungs are clear. If you have an incision (the cut made at the time of surgery), support your incision when coughing by placing a pillow or rolled up towels firmly against it. Once you are able to get out of bed, walk around indoors and cough well. You may stop using the incentive spirometer when instructed by your caregiver.  RISKS AND COMPLICATIONS  Take your time so you do not get dizzy or light-headed.  If you are in pain, you may need to take or ask for pain medication before doing incentive spirometry. It is harder to take a deep breath if you are having pain. AFTER USE  Rest and breathe slowly and easily.  It can be helpful to keep track of a log of your progress. Your caregiver can provide you with a simple table to help with this. If you are using the spirometer at home, follow these instructions: [place] IF:   You are having difficultly using the spirometer.  You have trouble using the spirometer as often as instructed.  Your pain medication is not giving enough relief while using the spirometer.  You  develop fever of 100.5 F (38.1 C) or higher. SEEK IMMEDIATE MEDICAL CARE IF:   You cough up bloody sputum that had not been present  before.  You develop fever of 102 F (38.9 C) or greater.  You develop worsening pain at or near the incision site. MAKE SURE YOU:   Understand these instructions.  Will watch your condition.  Will get help right away if you are not doing well or get worse. Document Released: 11/30/2006 Document Revised: 10/12/2011 Document Reviewed: 01/31/2007 [facility] Patient Information 2014 [place], [place].   ________________________________________________________________________

## 2019-04-13 ENCOUNTER — Other Ambulatory Visit: Payer: Self-pay

## 2019-04-13 ENCOUNTER — Encounter: Payer: Self-pay | Admitting: Cardiology

## 2019-04-13 ENCOUNTER — Ambulatory Visit (INDEPENDENT_AMBULATORY_CARE_PROVIDER_SITE_OTHER): Payer: PPO | Admitting: Cardiology

## 2019-04-13 ENCOUNTER — Encounter (HOSPITAL_COMMUNITY)
Admission: RE | Admit: 2019-04-13 | Discharge: 2019-04-13 | Disposition: A | Discharge status: Home / Self Care | Payer: PPO | Source: Ambulatory Visit | Attending: Orthopaedic Surgery | Admitting: Orthopaedic Surgery

## 2019-04-13 ENCOUNTER — Ambulatory Visit (HOSPITAL_COMMUNITY)
Admission: RE | Admit: 2019-04-13 | Discharge: 2019-04-13 | Disposition: A | Discharge status: Home / Self Care | Payer: PPO | Source: Ambulatory Visit | Attending: Orthopaedic Surgery | Admitting: Orthopaedic Surgery

## 2019-04-13 ENCOUNTER — Encounter (HOSPITAL_COMMUNITY): Payer: Self-pay

## 2019-04-13 VITALS — BP 138/70 | HR 78 | Ht 69.0 in | Wt 137.0 lb

## 2019-04-13 DIAGNOSIS — G629 Polyneuropathy, unspecified: Secondary | ICD-10-CM | POA: Insufficient documentation

## 2019-04-13 DIAGNOSIS — K219 Gastro-esophageal reflux disease without esophagitis: Secondary | ICD-10-CM | POA: Diagnosis not present

## 2019-04-13 DIAGNOSIS — M1611 Unilateral primary osteoarthritis, right hip: Secondary | ICD-10-CM | POA: Diagnosis not present

## 2019-04-13 DIAGNOSIS — Z01818 Encounter for other preprocedural examination: Secondary | ICD-10-CM | POA: Diagnosis not present

## 2019-04-13 DIAGNOSIS — R011 Cardiac murmur, unspecified: Secondary | ICD-10-CM

## 2019-04-13 DIAGNOSIS — Z79899 Other long term (current) drug therapy: Secondary | ICD-10-CM | POA: Diagnosis not present

## 2019-04-13 DIAGNOSIS — I341 Nonrheumatic mitral (valve) prolapse: Secondary | ICD-10-CM | POA: Diagnosis not present

## 2019-04-13 DIAGNOSIS — Z96642 Presence of left artificial hip joint: Secondary | ICD-10-CM | POA: Insufficient documentation

## 2019-04-13 DIAGNOSIS — J449 Chronic obstructive pulmonary disease, unspecified: Secondary | ICD-10-CM | POA: Insufficient documentation

## 2019-04-13 DIAGNOSIS — Z01810 Encounter for preprocedural cardiovascular examination: Secondary | ICD-10-CM

## 2019-04-13 LAB — CBC WITH DIFFERENTIAL/PLATELET
Abs Immature Granulocytes: 0.03 10*3/uL (ref 0.00–0.07)
Basophils Absolute: 0 10*3/uL (ref 0.0–0.1)
Basophils Relative: 1 %
Eosinophils Absolute: 0.2 10*3/uL (ref 0.0–0.5)
Eosinophils Relative: 4 %
HCT: 41.7 % (ref 36.0–46.0)
Hemoglobin: 13.4 g/dL (ref 12.0–15.0)
Immature Granulocytes: 1 %
Lymphocytes Relative: 24 %
Lymphs Abs: 1.6 10*3/uL (ref 0.7–4.0)
MCH: 28.3 pg (ref 26.0–34.0)
MCHC: 32.1 g/dL (ref 30.0–36.0)
MCV: 88 fL (ref 80.0–100.0)
Monocytes Absolute: 0.5 10*3/uL (ref 0.1–1.0)
Monocytes Relative: 7 %
Neutro Abs: 4.3 10*3/uL (ref 1.7–7.7)
Neutrophils Relative %: 63 %
Platelets: 271 10*3/uL (ref 150–400)
RBC: 4.74 MIL/uL (ref 3.87–5.11)
RDW: 14.1 % (ref 11.5–15.5)
WBC: 6.6 10*3/uL (ref 4.0–10.5)
nRBC: 0 % (ref 0.0–0.2)

## 2019-04-13 LAB — URINALYSIS, ROUTINE W REFLEX MICROSCOPIC
Bilirubin Urine: NEGATIVE
Glucose, UA: NEGATIVE mg/dL
Ketones, ur: NEGATIVE mg/dL
Nitrite: NEGATIVE
Protein, ur: NEGATIVE mg/dL
Specific Gravity, Urine: 1.01 (ref 1.005–1.030)
pH: 7 (ref 5.0–8.0)

## 2019-04-13 LAB — BASIC METABOLIC PANEL
Anion gap: 9 (ref 5–15)
BUN: 14 mg/dL (ref 8–23)
CO2: 29 mmol/L (ref 22–32)
Calcium: 9.9 mg/dL (ref 8.9–10.3)
Chloride: 96 mmol/L — ABNORMAL LOW (ref 98–111)
Creatinine, Ser: 0.87 mg/dL (ref 0.44–1.00)
GFR calc Af Amer: >60 mL/min (ref >60)
GFR calc non Af Amer: >60 mL/min (ref >60)
Glucose, Bld: 94 mg/dL (ref 70–99)
Potassium: 4.2 mmol/L (ref 3.5–5.1)
Sodium: 134 mmol/L — ABNORMAL LOW (ref 135–145)

## 2019-04-13 LAB — APTT: aPTT: 28 seconds (ref 24–36)

## 2019-04-13 LAB — SURGICAL PCR SCREEN
MRSA, PCR: NEGATIVE
Staphylococcus aureus: NEGATIVE

## 2019-04-13 LAB — PROTIME-INR
INR: 1 (ref 0.8–1.2)
Prothrombin Time: 13.3 seconds (ref 11.4–15.2)

## 2019-04-13 LAB — ABO/RH: ABO/RH(D): A NEG

## 2019-04-13 NOTE — Progress Notes (Signed)
Cardiology Office Note:    Date:  04/13/2019   ID:  Wendy Patton, DOB [birth year], MRN [id number]  PCP:  Trinna Post, PA-C  Cardiologist:  Ida Rogue, MD  Referring MD: Trinna Post, PA-C   Chief Complaint  Patient presents with  . Pre-op Exam    History of Present Illness:    Wendy Patton is a 83 y.o. female with a past medical history significant for hypertension, hyperlipidemia, mitral valve prolapse, arthritis of the knees, GERD, dependent edema and ichthyosis.  The patient has only been seen once in our office by Dr. Rockey Situ in 10/2017.  She was noted to have been recently seen by ENT for laryngeal pharyngeal reflux, GERD at that time and reportedly having shortness of breath.  She had been seen previously by pulmonary and felt that her symptoms were secondary to reflux.  At that visit she was complaining of exertional shortness of breath and fatigue.  A prior chest CT in 2010 showed no coronary calcifications.  An echocardiogram was done on 10/28/2017 that showed normal LV systolic function with EF 60-65%, no significant valve disease to explain her shortness of breath.    She has been seen by vascular surgeon related to right lower leg swelling, but no evidence of DVT or significant lower extremity reflux.  She had tried compression garments in the past which were very uncomfortable for her.  Dr. Donnetta Hutching recommended only elevation and could consider diuretic therapy.  Wendy Patton is here today for clearance to have right hip replacement. She says that she originally saw Dr. Candis Musa after she passed out. She says that they have determined that this was likely due to low blood sugar and/or dehydration. She denies chest pain/pressure or shortness of breath. She lives with her husband who is a [employer]ar[employer] manager. She exercises daily, Yoga, leg exercises for knee pain. She is able to walk up 2 flights of staris, slowly due to hip pain. No problems with shortness of breath.   Her reflux  improved after stopping asthma medicine. She says under control now on protonix.   She has to be on estradiol due to hereditary skin condition, ichthyosis, and prevention of UTI. Allergic to premarin.   Left leg swells at the end of the day if she does not move around much, better if she is more active. No swelling today.   Past Medical History:  Diagnosis Date  . Anemia    after jaw surgery was on iron for short period of time  . Arthritis   . Cancer (HCC)    squamous cell  . Difficult intubation    potential for difficult airway due to limited mouth opening  . Dry skin   . GERD (gastroesophageal reflux disease)    takes Aciphex daily  . Headache(784.0)    hx of migraines   . Heart murmur   . History of migraine    last one Aug 2014 and takes Imitrex daily as needed  . History of staph infection   . IBS (irritable bowel syndrome)   . Joint pain   . Joint swelling   . Laryngopharyngeal reflux   . MVP (mitral valve prolapse)   . Neuralgia   . Neuropathy    takes gabapentin daily  . PONV (postoperative nausea and vomiting)    TMJ surgery several times  . Rheumatic fever    as a child  . Shortness of breath    with exertion  . Thyroid disease  Past Surgical History:  Procedure Laterality Date  . ABDOMINAL HYSTERECTOMY  2009  . BREAST DUCTAL SYSTEM EXCISION Right 01/11/2014   Procedure: MAJOR DUCT EXCISION RIGHT BREAST;  Surgeon: Stark Klein, MD;  Location: [facility] ORS;  Service: General;  Laterality: Right;  . BREAST EXCISIONAL BIOPSY Right 2015   neg  . BREAST EXCISIONAL BIOPSY Left 2010   benign papilloma removed  . BREAST EXCISIONAL BIOPSY Left 1975   neg  . BREAST SURGERY  1975, 2010   benign breast tumor  . breat excision  1975   benign breast tumor excision  . CATARACT EXTRACTION W/PHACO Right 07/15/2016   Procedure: CATARACT EXTRACTION PHACO AND INTRAOCULAR LENS PLACEMENT (IOC);  Surgeon: Estill Cotta, MD;  Location: [facility] ORS;  Service: Ophthalmology;   Laterality: Right;  Lot # [id number] H [place]: 01:38.6 AP%: 25.0 CDE: 46.89  . CATARACT EXTRACTION W/PHACO Left 09/02/2016   Procedure: CATARACT EXTRACTION PHACO AND INTRAOCULAR LENS PLACEMENT (IOC);  Surgeon: Estill Cotta, MD;  Location: [facility] ORS;  Service: Ophthalmology;  Laterality: Left;  [place] 2:05.3 AP% 24.9 CDE 51.84 Fluid pack lot # [id number]  . cervial polyps  1999  . [place]  . COLONOSCOPY    . CYSTECTOMY  1962   vaginal cyst removal  . DILATION AND CURETTAGE OF UTERUS  1984  . ESOPHAGOGASTRODUODENOSCOPY    . granuloma  2002   chin granuloma removed  . [place], 2002, 2004   prosthesis, condyles implants, fossa replaced  . SIGMOIDOSCOPY  1993  . SQUAMOUS CELL CARCINOMA EXCISION  2008   left ankle/skin graft  . [place]  . THYROIDECTOMY  2006   left  . TONSILLECTOMY AND ADENOIDECTOMY  1944  . TOTAL HIP ARTHROPLASTY Left 09/12/2013   Procedure: LEFT TOTAL HIP ARTHROPLASTY ANTERIOR APPROACH;  Surgeon: [name], MD;  Location: [place];  Service: Orthopedics;  Laterality: Left;  . TRAPEZIUM RESECTION  2000, 2004   removal right & left hand trapezium  . TUBAL LIGATION    . uterine polyps  2008  . vaginal cyst removed  1962    Current Medications: Current Meds  Medication Sig  . acetaminophen (TYLENOL) 650 MG CR tablet Take 650 mg by mouth 2 (two) times daily as needed for pain.  . Calcium Carb-Cholecalciferol (CALCIUM 600+D) 600-800 MG-UNIT TABS Take 1 tablet by mouth 2 (two) times daily.  . Cholecalciferol (VITAMIN D) 2000 units tablet Take 4,000 Units by mouth daily.   . cycloSPORINE (RESTASIS) 0.05 % ophthalmic emulsion Place 1 drop into both eyes 2 (two) times daily.   Marland Kitchen estradiol (ESTRACE) 1 MG tablet Take 1 tablet (1 mg total) by mouth 4 (four) times a week.  . Glucosamine HCl 1000 MG TABS Take 1,000 mg by mouth daily.  . Omega-3 1000 MG CAPS Take 1,000 mg by mouth daily.   .  pantoprazole (PROTONIX) 40 MG tablet Take 1 tablet (40 mg total) by mouth 2 (two) times daily.  Vladimir Faster Glycol-Propyl Glycol (SYSTANE OP) Place 1 drop into both eyes daily as needed (dry eyes).  . vitamin B-12 (CYANOCOBALAMIN) 1000 MCG tablet Take 1,000 mcg by mouth daily.  . vitamin E 1000 UNIT capsule Take 1,000 Units by mouth daily.       Allergies:   Avelox [moxifloxacin], Neurontin [gabapentin], Nitrofurantoin monohyd macro, Percocet [oxycodone-acetaminophen], Pulmicort turbuhaler [budesonide], Amoxicillin, Cefdinir, Chlorhexidine, Ciprofloxacin hcl, Clindamycin/lincomycin, Doxycycline, Entex, Hydrocodone, Other, Tavist-d [albertsons dayhist-d], Tizanidine, and Witch hazel   Social  History   Socioeconomic History  . Marital status: Married    Spouse name: Not on file  . Number of children: Not on file  . Years of education: Not on file  . Highest education level: Not on file  Occupational History  . Not on file  Social Needs  . Financial resource strain: Not on file  . Food insecurity    Worry: Not on file    Inability: Not on file  . Transportation needs    Medical: Not on file    Non-medical: Not on file  Tobacco Use  . Smoking status: Never Smoker  . Smokeless tobacco: Never Used  Substance and Sexual Activity  . Alcohol use: No    Alcohol/week: 0.0 standard drinks  . Drug use: No  . Sexual activity: Yes    Birth control/protection: Surgical  Lifestyle  . Physical activity    Days per week: Not on file    Minutes per session: Not on file  . Stress: Not on file  Relationships  . Social Herbalist on phone: Not on file    Gets together: Not on file    Attends religious service: Not on file    Active member of club or organization: Not on file    Attends meetings of clubs or organizations: Not on file    Relationship status: Not on file  Other Topics Concern  . Not on file  Social History Narrative  . Not on file     Family History: The  patient's family history includes Heart attack in her maternal grandmother. There is no history of Breast cancer. ROS:   Please see the history of present illness.     All other systems reviewed and are negative.  EKGs/Labs/Other Studies Reviewed:    The following studies were reviewed today:  Echocardiogram 10/28/2017 Study Conclusions  - Left ventricle: The cavity size was normal. There was mild   concentric hypertrophy. Systolic function was normal. The   estimated ejection fraction was in the range of 60% to 65%. Wall   motion was normal; there were no regional wall motion   abnormalities. Features are consistent with a pseudonormal left   ventricular filling pattern, with concomitant abnormal relaxation   and increased filling pressure (grade 2 diastolic dysfunction). - Left atrium: The atrium was mildly dilated. - Right ventricle: Systolic function was normal. - Pulmonary arteries: Systolic pressure was mildly elevated. PA   peak pressure: 38 mm Hg (S). - Pericardium, extracardiac: A trivial pericardial effusion was   identified.  EKG:  EKG is ordered today.  The ekg ordered today demonstrates sinus rhythm with right bundle branch block, left posterior fascicular block, 78 bpm, similar to prior EKG.  Recent Labs: 12/13/2018: ALT 15; BUN 14; Creatinine, Ser 0.83; Hemoglobin 13.3; Platelets 260; Potassium 4.5; Sodium 133; TSH 3.020   Recent Lipid Panel    Component Value Date/Time   CHOL 239 (H) 12/13/2018 1105   TRIG 122 12/13/2018 1105   HDL 82 12/13/2018 1105   CHOLHDL 2.9 12/13/2018 1105   LDLCALC 133 (H) 12/13/2018 1105    Physical Exam:    VS:  BP 138/70   Pulse 78   Ht 5\' 9"  (1.753 m)   Wt 137 lb (62.1 kg)   SpO2 95%   BMI 20.23 kg/m     Wt Readings from Last 3 Encounters:  04/13/19 137 lb (62.1 kg)  04/05/19 137 lb 6.4 oz (62.3 kg)  04/04/19 139 lb (  63 kg)     Physical Exam  Constitutional: She is oriented to person, place, and time. She appears  well-developed and well-nourished. No distress.  HENT:  Head: Normocephalic and atraumatic.  Neck: Normal range of motion. Neck supple. No JVD present.  Cardiovascular: Normal rate, regular rhythm and intact distal pulses. Exam reveals no gallop and no friction rub.  Murmur heard.  Holosystolic murmur is present with a grade of 2/6 at the upper left sternal border. Pulmonary/Chest: Effort normal and breath sounds normal. No respiratory distress. She has no wheezes. She has no rales.  Abdominal: Soft. Bowel sounds are normal.  Musculoskeletal: Normal range of motion.  Neurological: She is alert and oriented to person, place, and time.  Skin: Skin is warm and dry.  Hereditary skin disorder with patchy alopecia  Psychiatric: She has a normal mood and affect. Her behavior is normal. Judgment and thought content normal.    ASSESSMENT:    1. Preop cardiovascular exam   2. Murmur, cardiac   3. Gastroesophageal reflux disease without esophagitis    PLAN:    In order of problems listed above:  Cardiac clearance for hip replacement -No prior history of CAD or CHF.  Patient had normal LV systolic function on echocardiogram in 10/2017.  She does have grade 2 diastolic dysfunction. -Based on health history and the revised cardiac risk index, this patient is a low risk for surgery. -Her activity level is good. No chest discomfort or shortness of breath.  -Based on ACC/AHA guidelines, Wendy Patton would be at acceptable risk for the planned procedure without further cardiovascular testing.   Cardiac murmur -Patient states she has had it her whole life, presumed mitral valve prolapse.  Echocardiogram in 10/2017 showed no aortic stenosis or regurgitation and no mitral stenosis or regurgitation.  GERD -Now well controlled on PPI.   I will E fax this clearance note to the requesting provider.   Medication Adjustments/Labs and Tests Ordered: Current medicines are reviewed at length with the  patient today.  Concerns regarding medicines are outlined above. Labs and tests ordered and medication changes are outlined in the patient instructions below:  Patient Instructions  Medication Instructions:  Your physician recommends that you continue on your current medications as directed. Please refer to the Current Medication list given to you today.  If you need a refill on your cardiac medications before your next appointment, please call your pharmacy.   Lab work: None   If you have labs (blood work) drawn today and your tests are completely normal, you will receive your results only by: Marland Kitchen MyChart Message (if you have MyChart) OR . A paper copy in the mail If you have any lab test that is abnormal or we need to change your treatment, we will call you to review the results.  Testing/Procedures: None   Follow-Up: You can follow up with our office as needed   Any Other Special Instructions Will Be Listed Below (If Applicable).       Signed, Daune Perch, NP  04/13/2019 10:20 AM    [place]

## 2019-04-13 NOTE — Progress Notes (Signed)
PCP - Dr. Glenna Fellows Cardiologist - Dr. Cheryle Horsfall  Chest x-ray - 04/13/19 EKG - 03/29/19 Stress Test - no ECHO - 10/28/17 Cardiac Cath - no  Sleep Study - no CPAP -   Fasting Blood Sugar - NA Checks Blood Sugar _____ times a day  Blood Thinner Instructions:NA Aspirin Instructions: Last Dose:  Anesthesia review:   Patient denies shortness of breath, fever, cough and chest pain at PAT appointment yes  Patient verbalized understanding of instructions that were given to them at the PAT appointment. Patient was also instructed that they will need to review over the PAT instructions again at home before surgery. yes

## 2019-04-13 NOTE — Patient Instructions (Signed)
Medication Instructions:  Your physician recommends that you continue on your current medications as directed. Please refer to the Current Medication list given to you today.  If you need a refill on your cardiac medications before your next appointment, please call your pharmacy.   Lab work: None  If you have labs (blood work) drawn today and your tests are completely normal, you will receive your results only by: . MyChart Message (if you have MyChart) OR . A paper copy in the mail If you have any lab test that is abnormal or we need to change your treatment, we will call you to review the results.  Testing/Procedures: None  Follow-Up: You can follow up with our office as needed  Any Other Special Instructions Will Be Listed Below (If Applicable).    

## 2019-04-14 ENCOUNTER — Other Ambulatory Visit
Admission: RE | Admit: 2019-04-14 | Discharge: 2019-04-14 | Disposition: A | Discharge status: Home / Self Care | Payer: PPO | Source: Ambulatory Visit | Attending: Orthopaedic Surgery | Admitting: Orthopaedic Surgery

## 2019-04-14 ENCOUNTER — Telehealth: Payer: Self-pay

## 2019-04-14 DIAGNOSIS — Z01812 Encounter for preprocedural laboratory examination: Secondary | ICD-10-CM | POA: Insufficient documentation

## 2019-04-14 DIAGNOSIS — Z20828 Contact with and (suspected) exposure to other viral communicable diseases: Secondary | ICD-10-CM | POA: Insufficient documentation

## 2019-04-14 NOTE — Telephone Encounter (Signed)
I looked over the result and the urine showed some white blood cells but not blood or nitrites that we typically see with infections. I wouldn't necessarily call it an infection, especially without a culture. If she is not having symptoms like burning with urination, I wouldn't worry about getting an antibiotic for this.

## 2019-04-14 NOTE — Telephone Encounter (Signed)
Patient advised as below. Patient denies any symptoms.

## 2019-04-14 NOTE — Telephone Encounter (Signed)
Patient is calling that she is scheduled to have knee replacement on Tuesday and that she went to [place] long dx. Mild UTI but was not treated because she I allergic to a lot of medicines. They told her hat it not really bad  and she just wants to know what to do or if you can send in an antibiotic for her to [facility]. Please call patient and advise.  CB#[phone]

## 2019-04-15 LAB — SARS CORONAVIRUS 2 (TAT 6-24 HRS): SARS Coronavirus 2: NEGATIVE

## 2019-04-17 MED ORDER — BUPIVACAINE LIPOSOME 1.3 % IJ SUSP
10.0000 mL | Freq: Once | INTRAMUSCULAR | Status: DC
  Filled 2019-04-17: qty 10

## 2019-04-17 MED ORDER — TRANEXAMIC ACID 1000 MG/10ML IV SOLN
2000.0000 mg | INTRAVENOUS | Status: DC
  Filled 2019-04-17: qty 20

## 2019-04-17 NOTE — Progress Notes (Signed)
Anesthesia Chart Review   Case: [id number] Date/Time: 04/18/19 1205   Procedure: RIGHT TOTAL HIP ARTHROPLASTY ANTERIOR APPROACH (Right Hip)   Anesthesia type: Spinal   Pre-op diagnosis: RIGHT HIP DEGENERATIVE JOINT DISEASE   Location: [place] [facility] / [facility]   Surgeon: Melrose Nakayama, MD      DISCUSSION:83 y.o. never smoker with h/o PONV, potential difficult intubation, GERD, MVP, COPD, right hip DJD scheduled for above procedure 04/18/2019 with Dr. Melrose Nakayama.   H/o mandible fracture surgery with prosthetic condyles implanted, fossa replaced.  Per anesthesia review by Myra Gianotti, PA-C 09/01/13, "She is a potential for DIFFICULT INTUBATION because she has very limited mouth opening (approximately 1 1/2 finger breadths).  She may have had an awake intubation in the past--she's note sure.  She also has issues with laryngopharyngeal reflux.  On 08/16/07 she had a hysterectomy at [facility]. Rapid sequence oral intubation was performed with successful intubation with a 6.0 ETT after two attempts using a Miller 3 blade, stylet, and tooth guard. Staff at [facility] state that she was not intubated for her colonoscopy there."  Pt scheduled for spinal anesthesia.   No anesthesia complications noted with THA 2015 or subsequent procedure.   Pt seen by cardiology 04/13/2019.  Seen by Daune Perch, NP.  Per OV note, "No prior history of CAD of CHF.  Patient had normal LV systolic function on echocardiogram in 10/2017.  She does have grade 2 diastolic dysfunction.  Based on health history and the revised cardiac risk index, this patient is low risk for surgery.  Her activity level is good.  No chest discomfort or shortness of breath.  Based on ACC/AHA guidelines, KANIA HORBAL would be at acceptable risk for the planned procedure without further cardiovascular testing."  Anticipate pt can proceed with planned procedure barring acute status change and after anesthesia evaluation DOS.  I was unable to personally  evaluate during PAT, nurse did not make me aware of difficult intubation.   VS: BP (!) 153/66   Pulse 77   Temp 37.2 C (Oral)   Resp 16   Ht 5\' 8"  (1.727 m)   Wt 61.7 kg   SpO2 98%   BMI 20.68 kg/m   PROVIDERS: Trinna Post, PA-C is PCP   Ida Rogue, MD is Cardiologist  LABS: Labs reviewed: Acceptable for surgery. (all labs ordered are listed, but only abnormal results are displayed)  Labs Reviewed  BASIC METABOLIC PANEL - Abnormal; Notable for the following components:      Result Value   Sodium 134 (*)    Chloride 96 (*)    All other components within normal limits  URINALYSIS, ROUTINE W REFLEX MICROSCOPIC - Abnormal; Notable for the following components:   APPearance HAZY (*)    Hgb urine dipstick SMALL (*)    Leukocytes,Ua SMALL (*)    Bacteria, UA RARE (*)    All other components within normal limits  SURGICAL PCR SCREEN  APTT  CBC WITH DIFFERENTIAL/PLATELET  PROTIME-INR  TYPE AND SCREEN  ABO/RH     IMAGES: Chest Xray 04/13/2019 FINDINGS: Cardiomediastinal silhouette is normal. Mediastinal contours appear intact.  There is no evidence of focal airspace consolidation, pleural effusion or pneumothorax. Stable soft tissue nodule in the left upper lobe, stable from 2015 and therefore likely benign.  Osseous structures are without acute abnormality. Soft tissues are grossly normal.  IMPRESSION: No active cardiopulmonary disease.  EKG: 04/14/2019 Rate 78 bpm Sinus rhythm with premature atrial complexes Right bundle branch block  Left posterior fascicular block  Bifascicular block  Inferior infarct, age undetermined   CV: Echo 10/28/2017 Study Conclusions  - Left ventricle: The cavity size was normal. There was mild   concentric hypertrophy. Systolic function was normal. The   estimated ejection fraction was in the range of 60% to 65%. Wall   motion was normal; there were no regional wall motion   abnormalities. Features are consistent  with a pseudonormal left   ventricular filling pattern, with concomitant abnormal relaxation   and increased filling pressure (grade 2 diastolic dysfunction). - Left atrium: The atrium was mildly dilated. - Right ventricle: Systolic function was normal. - Pulmonary arteries: Systolic pressure was mildly elevated. PA   peak pressure: 38 mm Hg (S). - Pericardium, extracardiac: A trivial pericardial effusion was   identified.  Past Medical History:  Diagnosis Date  . Anemia    after jaw surgery was on iron for short period of time  . Arthritis   . Cancer (HCC)    squamous cell  . Difficult intubation    potential for difficult airway due to limited mouth opening  . Dry skin   . GERD (gastroesophageal reflux disease)    takes Aciphex daily  . Headache(784.0)    hx of migraines   . Heart murmur   . History of migraine    last one Aug 2014 and takes Imitrex daily as needed  . History of staph infection   . IBS (irritable bowel syndrome)   . Joint pain   . Joint swelling   . Laryngopharyngeal reflux   . MVP (mitral valve prolapse)   . Neuralgia   . Neuropathy    takes gabapentin daily  . PONV (postoperative nausea and vomiting)    TMJ surgery several times  . Rheumatic fever    as a child  . Shortness of breath    with exertion  . Thyroid disease     Past Surgical History:  Procedure Laterality Date  . ABDOMINAL HYSTERECTOMY  2009  . BREAST DUCTAL SYSTEM EXCISION Right 01/11/2014   Procedure: MAJOR DUCT EXCISION RIGHT BREAST;  Surgeon: Stark Klein, MD;  Location: [facility];  Service: General;  Laterality: Right;  . BREAST EXCISIONAL BIOPSY Right 2015   neg  . BREAST EXCISIONAL BIOPSY Left 2010   benign papilloma removed  . BREAST EXCISIONAL BIOPSY Left 1975   neg  . BREAST SURGERY  1975, 2010   benign breast tumor  . breat excision  1975   benign breast tumor excision  . CATARACT EXTRACTION W/PHACO Right 07/15/2016   Procedure: CATARACT EXTRACTION PHACO AND  INTRAOCULAR LENS PLACEMENT (IOC);  Surgeon: Estill Cotta, MD;  Location: [facility] OR[facility];  Service: Ophthalmology;  Laterality: Right;  Lot # [id number] H [place]: 01:38.6 AP%: 25.0 CDE: 46.89  . CATARACT EXTRACTION W/PHACO Left 09/02/2016   Procedure: CATARACT EXTRACTION PHACO AND INTRAOCULAR LENS PLACEMENT (IOC);  Surgeon: Estill Cotta, MD;  Location: [facility];  Service: Ophthalmology;  Laterality: Left;  [place] 2:05.3 AP% 24.9 CDE 51.84 Fluid pack lot # [id number]  . cervial polyps  1999  . [place]  . COLONOSCOPY    . CYSTECTOMY  1962   vaginal cyst removal  . DILATION AND CURETTAGE OF UTERUS  1984  . ESOPHAGOGASTRODUODENOSCOPY    . granuloma  2002   chin granuloma removed  . [place], 2002, 2004   prosthesis, condyles implants, fossa replaced  . SIGMOIDOSCOPY  1993  . SQUAMOUS CELL  CARCINOMA EXCISION  2008   left ankle/skin graft  . [place]  . THYROIDECTOMY  2006   left  . TONSILLECTOMY AND ADENOIDECTOMY  1944  . TOTAL HIP ARTHROPLASTY Left 09/12/2013   Procedure: LEFT TOTAL HIP ARTHROPLASTY ANTERIOR APPROACH;  Surgeon: [name], MD;  Location: [place];  Service: Orthopedics;  Laterality: Left;  . TRAPEZIUM RESECTION  2000, 2004   removal right & left hand trapezium  . TUBAL LIGATION    . uterine polyps  2008  . vaginal cyst removed  1962    MEDICATIONS: . acetaminophen (TYLENOL) 650 MG CR tablet  . Calcium Carb-Cholecalciferol (CALCIUM 600+D) 600-800 MG-UNIT TABS  . Cholecalciferol (VITAMIN D) 2000 units tablet  . cycloSPORINE (RESTASIS) 0.05 % ophthalmic emulsion  . estradiol (ESTRACE) 1 MG tablet  . Glucosamine HCl 1000 MG TABS  . Omega-3 1000 MG CAPS  . pantoprazole (PROTONIX) 40 MG tablet  . Polyethyl Glycol-Propyl Glycol (SYSTANE OP)  . vitamin B-12 (CYANOCOBALAMIN) 1000 MCG tablet  . vitamin E 1000 UNIT capsule   No current facility-administered medications for this encounter.     Derrill Memo ON 04/18/2019] bupivacaine liposome (EXPAREL) 1.3 % injection 133 mg  . [START ON 04/18/2019] tranexamic acid (CYKLOKAPRON) 2,000 mg in sodium chloride 0.9 % 50 mL Topical Application    Maia Plan [facility] Pre-Surgical Testing [id number] 04/17/19 11:56 AM

## 2019-04-17 NOTE — H&P (Signed)
TOTAL HIP ADMISSION H&P  Patient is admitted for right total hip arthroplasty.  Subjective:  Chief Complaint: right hip pain  HPI: Wendy Patton, 83 y.o. female, has a history of pain and functional disability in the right hip(s) due to arthritis and patient has failed non-surgical conservative treatments for greater than 12 weeks to include NSAID's and/or analgesics, corticosteriod injections, flexibility and strengthening excercises, use of assistive devices, weight reduction as appropriate and activity modification.  Onset of symptoms was gradual starting 5 years ago with gradually worsening course since that time.The patient noted no past surgery on the right hip(s).  Patient currently rates pain in the right hip at 10 out of 10 with activity. Patient has night pain, worsening of pain with activity and weight bearing, trendelenberg gait, pain that interfers with activities of daily living and crepitus. Patient has evidence of subchondral cysts, subchondral sclerosis, periarticular osteophytes and joint space narrowing by imaging studies. This condition presents safety issues increasing the risk of falls. There is no current active infection.  Patient Active Problem List   Diagnosis Date Noted  . Pectus excavatum 10/12/2017  . 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 Perimenopausal atrophic vaginitis 09/22/2016  . Menopausal hot flushes 09/22/2016  . Mitral valve prolapse 01/13/2016  . Herniation of nucleus pulposus 05/01/2015  . Hypercholesteremia 05/01/2015  . Osteoarthritis of both knees 05/01/2015  . History of migraine headaches 05/01/2015  . Esophageal reflux 01/10/2015  . S/P total hip arthroplasty 09/12/2013   Past Medical History:  Diagnosis Date  . Anemia    after jaw surgery was on iron for short period of time  . Arthritis   . Cancer (HCC)    squamous cell  . Difficult intubation    potential for difficult airway due to limited mouth opening  . Dry skin   . GERD  (gastroesophageal reflux disease)    takes Aciphex daily  . Headache(784.0)    hx of migraines   . Heart murmur   . History of migraine    last one Aug 2014 and takes Imitrex daily as needed  . History of staph infection   . IBS (irritable bowel syndrome)   . Joint pain   . Joint swelling   . Laryngopharyngeal reflux   . MVP (mitral valve prolapse)   . Neuralgia   . Neuropathy    takes gabapentin daily  . PONV (postoperative nausea and vomiting)    TMJ surgery several times  . Rheumatic fever    as a child  . Shortness of breath    with exertion  . Thyroid disease     Past Surgical History:  Procedure Laterality Date  . ABDOMINAL HYSTERECTOMY  2009  . BREAST DUCTAL SYSTEM EXCISION Right 01/11/2014   Procedure: MAJOR DUCT EXCISION RIGHT BREAST;  Surgeon: Stark Klein, MD;  Location: [facility];  Service: General;  Laterality: Right;  . BREAST EXCISIONAL BIOPSY Right 2015   neg  . BREAST EXCISIONAL BIOPSY Left 2010   benign papilloma removed  . BREAST EXCISIONAL BIOPSY Left 1975   neg  . BREAST SURGERY  1975, 2010   benign breast tumor  . breat excision  1975   benign breast tumor excision  . CATARACT EXTRACTION W/PHACO Right 07/15/2016   Procedure: CATARACT EXTRACTION PHACO AND INTRAOCULAR LENS PLACEMENT (IOC);  Surgeon: Estill Cotta, MD;  Location: [facility];  Service: Ophthalmology;  Laterality: Right;  Lot # [id number] H [place]: 01:38.6 AP%: 25.0 CDE: 46.89  . CATARACT EXTRACTION W/PHACO Left 09/02/2016  Procedure: CATARACT EXTRACTION PHACO AND INTRAOCULAR LENS PLACEMENT (IOC);  Surgeon: Estill Cotta, MD;  Location: [facility];  Service: Ophthalmology;  Laterality: Left;  [place] 2:05.3 AP% 24.9 CDE 51.84 Fluid pack lot # [id number]  . cervial polyps  1999  . [place]  . COLONOSCOPY    . CYSTECTOMY  1962   vaginal cyst removal  . DILATION AND CURETTAGE OF UTERUS  1984  . ESOPHAGOGASTRODUODENOSCOPY    . granuloma  2002   chin granuloma removed  . [place], 2002, 2004   prosthesis, condyles implants, fossa replaced  . SIGMOIDOSCOPY  1993  . SQUAMOUS CELL CARCINOMA EXCISION  2008   left ankle/skin graft  . [place]  . THYROIDECTOMY  2006   left  . TONSILLECTOMY AND ADENOIDECTOMY  1944  . TOTAL HIP ARTHROPLASTY Left 09/12/2013   Procedure: LEFT TOTAL HIP ARTHROPLASTY ANTERIOR APPROACH;  Surgeon: [name], MD;  Location: [place];  Service: Orthopedics;  Laterality: Left;  . TRAPEZIUM RESECTION  2000, 2004   removal right & left hand trapezium  . TUBAL LIGATION    . uterine polyps  2008  . vaginal cyst removed  1962    Current Facility-Administered Medications  Medication Dose Route Frequency Provider Last Rate Last Dose  . [START ON 04/18/2019] bupivacaine liposome (EXPAREL) 1.3 % injection 133 mg  10 mL Other Once Melrose Nakayama, MD      . Derrill Memo ON 04/18/2019] tranexamic acid (CYKLOKAPRON) 2,000 mg in sodium chloride 0.9 % 50 mL Topical Application  123XX123 mg Topical To OR Melrose Nakayama, MD       Current Outpatient Medications  Medication Sig Dispense Refill Last Dose  . acetaminophen (TYLENOL) 650 MG CR tablet Take 650 mg by mouth 2 (two) times daily as needed for pain.     . Calcium Carb-Cholecalciferol (CALCIUM 600+D) 600-800 MG-UNIT TABS Take 1 tablet by mouth 2 (two) times daily.     . Cholecalciferol (VITAMIN D) 2000 units tablet Take 4,000 Units by mouth daily.      . cycloSPORINE (RESTASIS) 0.05 % ophthalmic emulsion Place 1 drop into both eyes 2 (two) times daily.      . Glucosamine HCl 1000 MG TABS Take 1,000 mg by mouth daily.     . Omega-3 1000 MG CAPS Take 1,000 mg by mouth daily.      . pantoprazole (PROTONIX) 40 MG tablet Take 1 tablet (40 mg total) by mouth 2 (two) times daily. 60 tablet 1   . Polyethyl Glycol-Propyl Glycol (SYSTANE OP) Place 1 drop into both eyes daily as needed (dry eyes).     . vitamin B-12 (CYANOCOBALAMIN) 1000 MCG tablet Take  1,000 mcg by mouth daily.     . vitamin E 1000 UNIT capsule Take 1,000 Units by mouth daily.       Marland Kitchen estradiol (ESTRACE) 1 MG tablet Take 1 tablet (1 mg total) by mouth 4 (four) times a week. 48 tablet 3    Allergies  Allergen Reactions  . Avelox [Moxifloxacin] Other (See Comments)    C.diff colitis   . Neurontin [Gabapentin] Diarrhea    abd cramping   . Nitrofurantoin Monohyd Macro Hives  . Percocet [Oxycodone-Acetaminophen] Hives  . Pulmicort Turbuhaler [Budesonide] Other (See Comments)    Blisters in mouth   . Amoxicillin Rash    Did it involve swelling of the face/tongue/throat, SOB, or low BP? No Did it involve sudden or severe  rash/hives, skin peeling, or any reaction on the inside of your mouth or nose? No Did you need to seek medical attention at a hospital or doctor's office? Unknown When did it last happen?unknown If all above answers are "NO", may proceed with cephalosporin use.   . Cefdinir Rash  . Chlorhexidine Rash  . Ciprofloxacin Hcl Rash  . Clindamycin/Lincomycin Rash  . Doxycycline Rash  . Entex Hives and Rash  . Hydrocodone Rash  . Other Rash    Dial soap   . Tavist-D [Albertsons Dayhist-D] Rash  . Tizanidine Rash  . Witch Hazel Rash    Social History   Tobacco Use  . Smoking status: Never Smoker  . Smokeless tobacco: Never Used  Substance Use Topics  . Alcohol use: No    Alcohol/week: 0.0 standard drinks    Family History  Problem Relation Age of Onset  . Heart attack Maternal Grandmother   . Breast cancer Neg Hx      Review of Systems  Musculoskeletal: Positive for joint pain.       Right hip  All other systems reviewed and are negative.   Objective:  Physical Exam  Constitutional: She is oriented to person, place, and time. She appears well-developed and well-nourished.  HENT:  Head: Normocephalic and atraumatic.  Eyes: Pupils are equal, round, and reactive to light.  Neck: Normal range of motion.  Cardiovascular: Normal  rate and regular rhythm.  Respiratory: Effort normal.  GI: Soft.  Musculoskeletal:     Comments: Right hip motion is extremely painful in internal rotation.  Her leg lengths look equal.  Opposite hip has painless range of motion.  She walks with an altered gait.  Sensation and motor function are intact in her feet with palpable pulses on both sides.    Neurological: She is alert and oriented to person, place, and time.  Skin: Skin is warm and dry.  Psychiatric: She has a normal mood and affect. Her behavior is normal. Judgment and thought content normal.    Vital signs in last 24 hours:    Labs:   Estimated body mass index is 20.68 kg/m as calculated from the following:   Height as of 04/13/19: 5\' 8"  (1.727 m).   Weight as of 04/13/19: 61.7 kg.   Imaging Review Plain radiographs demonstrate severe degenerative joint disease of the right hip(s). The bone quality appears to be good for age and reported activity level.      Assessment/Plan:  End stage primary arthritis, right hip(s)  The patient history, physical examination, clinical judgement of the provider and imaging studies are consistent with end stage degenerative joint disease of the right hip(s) and total hip arthroplasty is deemed medically necessary. The treatment options including medical management, injection therapy, arthroscopy and arthroplasty were discussed at length. The risks and benefits of total hip arthroplasty were presented and reviewed. The risks due to aseptic loosening, infection, stiffness, dislocation/subluxation,  thromboembolic complications and other imponderables were discussed.  The patient acknowledged the explanation, agreed to proceed with the plan and consent was signed. Patient is being admitted for inpatient treatment for surgery, pain control, PT, OT, prophylactic antibiotics, VTE prophylaxis, progressive ambulation and ADL's and discharge planning.The patient is planning to be discharged home  with home health services

## 2019-04-17 NOTE — Anesthesia Preprocedure Evaluation (Addendum)
Anesthesia Evaluation  Patient identified by MRN, date of birth, ID band Patient awake    Reviewed: Allergy & Precautions, H&P , NPO status , Patient's Chart, lab work & pertinent test results  History of Anesthesia Complications (+) PONV and DIFFICULT AIRWAY  Airway Mallampati: IV   Neck ROM: Full  Mouth opening: Limited Mouth Opening  Dental no notable dental hx. (+) Teeth Intact, Dental Advisory Given   Pulmonary neg pulmonary ROS,    Pulmonary exam normal breath sounds clear to auscultation       Cardiovascular negative cardio ROS   Rhythm:Regular Rate:Normal     Neuro/Psych  Headaches, negative psych ROS   GI/Hepatic Neg liver ROS, GERD  Medicated and Controlled,  Endo/Other  negative endocrine ROS  Renal/GU negative Renal ROS  negative genitourinary   Musculoskeletal  (+) Arthritis , Osteoarthritis,    Abdominal   Peds  Hematology negative hematology ROS (+)   Anesthesia Other Findings   Reproductive/Obstetrics negative OB ROS                            Anesthesia Physical Anesthesia Plan  ASA: II  Anesthesia Plan: Spinal and MAC   Post-op Pain Management:    Induction: Intravenous  PONV Risk Score and Plan: 4 or greater and Propofol infusion, Ondansetron, Dexamethasone and Treatment may vary due to age or medical condition  Airway Management Planned: Simple Face Mask  Additional Equipment:   Intra-op Plan:   Post-operative Plan:   Informed Consent: I have reviewed the patients History and Physical, chart, labs and discussed the procedure including the risks, benefits and alternatives for the proposed anesthesia with the patient or authorized representative who has indicated his/her understanding and acceptance.     Dental advisory given  Plan Discussed with: CRNA  Anesthesia Plan Comments: (See PAT note 04/13/2019, Konrad Felix, PA-C)       Anesthesia  Quick Evaluation

## 2019-04-18 ENCOUNTER — Ambulatory Visit (HOSPITAL_COMMUNITY): Payer: PPO

## 2019-04-18 ENCOUNTER — Encounter (HOSPITAL_COMMUNITY)
Admission: RE | Disposition: A | Discharge status: Home Health | Payer: Self-pay | Source: Home / Self Care | Attending: Orthopaedic Surgery

## 2019-04-18 ENCOUNTER — Ambulatory Visit (HOSPITAL_COMMUNITY): Payer: PPO | Admitting: Anesthesiology

## 2019-04-18 ENCOUNTER — Observation Stay (HOSPITAL_COMMUNITY)
Admission: RE | Admit: 2019-04-18 | Discharge: 2019-04-19 | Disposition: A | Discharge status: Home Health | Payer: PPO | Attending: Orthopaedic Surgery | Admitting: Orthopaedic Surgery

## 2019-04-18 ENCOUNTER — Other Ambulatory Visit: Payer: Self-pay

## 2019-04-18 ENCOUNTER — Ambulatory Visit (HOSPITAL_COMMUNITY): Payer: PPO | Admitting: Physician Assistant

## 2019-04-18 ENCOUNTER — Encounter (HOSPITAL_COMMUNITY): Payer: Self-pay | Admitting: Anesthesiology

## 2019-04-18 DIAGNOSIS — M170 Bilateral primary osteoarthritis of knee: Secondary | ICD-10-CM | POA: Insufficient documentation

## 2019-04-18 DIAGNOSIS — Z79890 Hormone replacement therapy (postmenopausal): Secondary | ICD-10-CM | POA: Diagnosis not present

## 2019-04-18 DIAGNOSIS — M1611 Unilateral primary osteoarthritis, right hip: Principal | ICD-10-CM | POA: Insufficient documentation

## 2019-04-18 DIAGNOSIS — Z471 Aftercare following joint replacement surgery: Secondary | ICD-10-CM | POA: Diagnosis not present

## 2019-04-18 DIAGNOSIS — I341 Nonrheumatic mitral (valve) prolapse: Secondary | ICD-10-CM | POA: Diagnosis not present

## 2019-04-18 DIAGNOSIS — Z888 Allergy status to other drugs, medicaments and biological substances status: Secondary | ICD-10-CM | POA: Diagnosis not present

## 2019-04-18 DIAGNOSIS — Z881 Allergy status to other antibiotic agents status: Secondary | ICD-10-CM | POA: Insufficient documentation

## 2019-04-18 DIAGNOSIS — Z885 Allergy status to narcotic agent status: Secondary | ICD-10-CM | POA: Insufficient documentation

## 2019-04-18 DIAGNOSIS — E7800 Pure hypercholesterolemia, unspecified: Secondary | ICD-10-CM | POA: Diagnosis not present

## 2019-04-18 DIAGNOSIS — G629 Polyneuropathy, unspecified: Secondary | ICD-10-CM | POA: Insufficient documentation

## 2019-04-18 DIAGNOSIS — K219 Gastro-esophageal reflux disease without esophagitis: Secondary | ICD-10-CM | POA: Diagnosis not present

## 2019-04-18 DIAGNOSIS — Z96641 Presence of right artificial hip joint: Secondary | ICD-10-CM | POA: Diagnosis not present

## 2019-04-18 DIAGNOSIS — Z880 Allergy status to penicillin: Secondary | ICD-10-CM | POA: Insufficient documentation

## 2019-04-18 DIAGNOSIS — Z96642 Presence of left artificial hip joint: Secondary | ICD-10-CM | POA: Diagnosis not present

## 2019-04-18 DIAGNOSIS — Z79899 Other long term (current) drug therapy: Secondary | ICD-10-CM | POA: Insufficient documentation

## 2019-04-18 DIAGNOSIS — Z419 Encounter for procedure for purposes other than remedying health state, unspecified: Secondary | ICD-10-CM

## 2019-04-18 HISTORY — PX: TOTAL HIP ARTHROPLASTY: SHX124

## 2019-04-18 LAB — TYPE AND SCREEN
ABO/RH(D): A NEG
Antibody Screen: NEGATIVE

## 2019-04-18 SURGERY — ARTHROPLASTY, HIP, TOTAL, ANTERIOR APPROACH
Anesthesia: Monitor Anesthesia Care | Site: Hip | Laterality: Right

## 2019-04-18 MED ORDER — ACETAMINOPHEN 500 MG PO TABS
1000.0000 mg | ORAL_TABLET | Freq: Once | ORAL | Status: AC
  Administered 2019-04-18: 1000 mg via ORAL
  Filled 2019-04-18: qty 2

## 2019-04-18 MED ORDER — TRANEXAMIC ACID-NACL 1000-0.7 MG/100ML-% IV SOLN
1000.0000 mg | Freq: Once | INTRAVENOUS | Status: AC
  Administered 2019-04-18: 1000 mg via INTRAVENOUS
  Filled 2019-04-18: qty 100

## 2019-04-18 MED ORDER — BUPIVACAINE-EPINEPHRINE 0.5% -1:200000 IJ SOLN
INTRAMUSCULAR | Status: DC | PRN
  Administered 2019-04-18: 30 mL

## 2019-04-18 MED ORDER — SODIUM CHLORIDE 0.9 % IV SOLN
INTRAVENOUS | Status: DC | PRN
  Administered 2019-04-18: 20 ug/min via INTRAVENOUS

## 2019-04-18 MED ORDER — VANCOMYCIN HCL IN DEXTROSE 1-5 GM/200ML-% IV SOLN
1000.0000 mg | INTRAVENOUS | Status: AC
  Administered 2019-04-18: 1000 mg via INTRAVENOUS
  Filled 2019-04-18: qty 200

## 2019-04-18 MED ORDER — FENTANYL CITRATE (PF) 100 MCG/2ML IJ SOLN: 25.0000 ug | INTRAMUSCULAR | Status: DC | PRN

## 2019-04-18 MED ORDER — ALUM & MAG HYDROXIDE-SIMETH 200-200-20 MG/5ML PO SUSP: 30.0000 mL | ORAL | Status: DC | PRN

## 2019-04-18 MED ORDER — BUPIVACAINE-EPINEPHRINE (PF) 0.5% -1:200000 IJ SOLN
INTRAMUSCULAR | Status: AC
  Filled 2019-04-18: qty 30

## 2019-04-18 MED ORDER — DIPHENHYDRAMINE HCL 12.5 MG/5ML PO ELIX: 12.5000 mg | ORAL_SOLUTION | ORAL | Status: DC | PRN

## 2019-04-18 MED ORDER — TRANEXAMIC ACID 1000 MG/10ML IV SOLN
INTRAVENOUS | Status: DC | PRN
  Administered 2019-04-18: 2000 mg via TOPICAL

## 2019-04-18 MED ORDER — TRAMADOL HCL 50 MG PO TABS
50.0000 mg | ORAL_TABLET | Freq: Four times a day (QID) | ORAL | Status: DC | PRN
  Administered 2019-04-18 – 2019-04-19 (×2): 50 mg via ORAL
  Filled 2019-04-18 (×2): qty 1

## 2019-04-18 MED ORDER — BISACODYL 5 MG PO TBEC: 5.0000 mg | DELAYED_RELEASE_TABLET | Freq: Every day | ORAL | Status: DC | PRN

## 2019-04-18 MED ORDER — ESTRADIOL 1 MG PO TABS: 1.0000 mg | ORAL_TABLET | ORAL | Status: DC

## 2019-04-18 MED ORDER — DEXAMETHASONE SODIUM PHOSPHATE 10 MG/ML IJ SOLN
INTRAMUSCULAR | Status: DC | PRN
  Administered 2019-04-18: 4 mg via INTRAVENOUS

## 2019-04-18 MED ORDER — FENTANYL CITRATE (PF) 100 MCG/2ML IJ SOLN
INTRAMUSCULAR | Status: DC | PRN
  Administered 2019-04-18: 50 ug via INTRAVENOUS

## 2019-04-18 MED ORDER — PANTOPRAZOLE SODIUM 40 MG PO TBEC
40.0000 mg | DELAYED_RELEASE_TABLET | Freq: Two times a day (BID) | ORAL | Status: DC
  Administered 2019-04-18 – 2019-04-19 (×2): 40 mg via ORAL
  Filled 2019-04-18 (×2): qty 1

## 2019-04-18 MED ORDER — BUPIVACAINE LIPOSOME 1.3 % IJ SUSP
INTRAMUSCULAR | Status: DC | PRN
  Administered 2019-04-18: 10 mL

## 2019-04-18 MED ORDER — METHOCARBAMOL 500 MG IVPB - SIMPLE MED
500.0000 mg | Freq: Four times a day (QID) | INTRAVENOUS | Status: DC | PRN
  Filled 2019-04-18: qty 50

## 2019-04-18 MED ORDER — ONDANSETRON HCL 4 MG PO TABS: 4.0000 mg | ORAL_TABLET | Freq: Four times a day (QID) | ORAL | Status: DC | PRN

## 2019-04-18 MED ORDER — METHOCARBAMOL 500 MG PO TABS
500.0000 mg | ORAL_TABLET | Freq: Four times a day (QID) | ORAL | Status: DC | PRN
  Administered 2019-04-18: 17:00:00 500 mg via ORAL
  Filled 2019-04-18: qty 1

## 2019-04-18 MED ORDER — KETOROLAC TROMETHAMINE 15 MG/ML IJ SOLN
7.5000 mg | Freq: Four times a day (QID) | INTRAMUSCULAR | Status: DC
  Administered 2019-04-18 – 2019-04-19 (×3): 7.5 mg via INTRAVENOUS
  Filled 2019-04-18 (×3): qty 1

## 2019-04-18 MED ORDER — TRANEXAMIC ACID-NACL 1000-0.7 MG/100ML-% IV SOLN
1000.0000 mg | INTRAVENOUS | Status: AC
  Administered 2019-04-18: 1000 mg via INTRAVENOUS
  Filled 2019-04-18: qty 100

## 2019-04-18 MED ORDER — PROPOFOL 500 MG/50ML IV EMUL
INTRAVENOUS | Status: DC | PRN
  Administered 2019-04-18: 75 ug/kg/min via INTRAVENOUS

## 2019-04-18 MED ORDER — METOCLOPRAMIDE HCL 5 MG PO TABS: 5.0000 mg | ORAL_TABLET | Freq: Three times a day (TID) | ORAL | Status: DC | PRN

## 2019-04-18 MED ORDER — POVIDONE-IODINE 10 % EX SWAB
2.0000 "application " | Freq: Once | CUTANEOUS | Status: AC
  Administered 2019-04-18: 2 via TOPICAL

## 2019-04-18 MED ORDER — FENTANYL CITRATE (PF) 100 MCG/2ML IJ SOLN
INTRAMUSCULAR | Status: AC
  Filled 2019-04-18: qty 2

## 2019-04-18 MED ORDER — METOCLOPRAMIDE HCL 5 MG/ML IJ SOLN: 5.0000 mg | Freq: Three times a day (TID) | INTRAMUSCULAR | Status: DC | PRN

## 2019-04-18 MED ORDER — ACETAMINOPHEN 500 MG PO TABS
500.0000 mg | ORAL_TABLET | Freq: Four times a day (QID) | ORAL | Status: AC
  Administered 2019-04-18 – 2019-04-19 (×4): 500 mg via ORAL
  Filled 2019-04-18 (×4): qty 1

## 2019-04-18 MED ORDER — DOCUSATE SODIUM 100 MG PO CAPS
100.0000 mg | ORAL_CAPSULE | Freq: Two times a day (BID) | ORAL | Status: DC
  Administered 2019-04-18 – 2019-04-19 (×2): 100 mg via ORAL
  Filled 2019-04-18 (×2): qty 1

## 2019-04-18 MED ORDER — ONDANSETRON HCL 4 MG/2ML IJ SOLN
INTRAMUSCULAR | Status: DC | PRN
  Administered 2019-04-18: 4 mg via INTRAVENOUS

## 2019-04-18 MED ORDER — ONDANSETRON HCL 4 MG/2ML IJ SOLN: 4.0000 mg | Freq: Four times a day (QID) | INTRAMUSCULAR | Status: DC | PRN

## 2019-04-18 MED ORDER — LACTATED RINGERS IV SOLN
INTRAVENOUS | Status: DC
  Administered 2019-04-18 – 2019-04-19 (×2): via INTRAVENOUS

## 2019-04-18 MED ORDER — CYCLOSPORINE 0.05 % OP EMUL
1.0000 [drp] | Freq: Two times a day (BID) | OPHTHALMIC | Status: DC
  Administered 2019-04-18 – 2019-04-19 (×2): 1 [drp] via OPHTHALMIC
  Filled 2019-04-18 (×4): qty 30

## 2019-04-18 MED ORDER — MENTHOL 3 MG MT LOZG: 1.0000 | LOZENGE | OROMUCOSAL | Status: DC | PRN

## 2019-04-18 MED ORDER — BUPIVACAINE IN DEXTROSE 0.75-8.25 % IT SOLN
INTRATHECAL | Status: DC | PRN
  Administered 2019-04-18: 1.8 mL via INTRATHECAL

## 2019-04-18 MED ORDER — ASPIRIN 81 MG PO CHEW
81.0000 mg | CHEWABLE_TABLET | Freq: Two times a day (BID) | ORAL | Status: DC
  Administered 2019-04-19: 81 mg via ORAL
  Filled 2019-04-18: qty 1

## 2019-04-18 MED ORDER — 0.9 % SODIUM CHLORIDE (POUR BTL) OPTIME
TOPICAL | Status: DC | PRN
  Administered 2019-04-18: 1000 mL

## 2019-04-18 MED ORDER — ACETAMINOPHEN 325 MG PO TABS: 325.0000 mg | ORAL_TABLET | Freq: Four times a day (QID) | ORAL | Status: DC | PRN

## 2019-04-18 MED ORDER — POVIDONE-IODINE 7.5 % EX SOLN: Freq: Once | CUTANEOUS | Status: DC

## 2019-04-18 MED ORDER — PHENOL 1.4 % MT LIQD: 1.0000 | OROMUCOSAL | Status: DC | PRN

## 2019-04-18 MED ORDER — LACTATED RINGERS IV SOLN
INTRAVENOUS | Status: DC
  Administered 2019-04-18 (×2): via INTRAVENOUS

## 2019-04-18 MED ORDER — VANCOMYCIN HCL IN DEXTROSE 1-5 GM/200ML-% IV SOLN
1000.0000 mg | Freq: Two times a day (BID) | INTRAVENOUS | Status: AC
  Administered 2019-04-18: 23:00:00 1000 mg via INTRAVENOUS
  Filled 2019-04-18: qty 200

## 2019-04-18 MED ORDER — PROPOFOL 10 MG/ML IV BOLUS
INTRAVENOUS | Status: DC | PRN
  Administered 2019-04-18: 10 mg via INTRAVENOUS

## 2019-04-18 MED ORDER — POLYVINYL ALCOHOL 1.4 % OP SOLN: 1.0000 [drp] | OPHTHALMIC | Status: DC | PRN

## 2019-04-18 MED ORDER — PROPOFOL 10 MG/ML IV BOLUS
INTRAVENOUS | Status: AC
  Filled 2019-04-18: qty 40

## 2019-04-18 MED ORDER — POLYETHYL GLYCOL-PROPYL GLYCOL 0.4-0.3 % OP GEL: Freq: Every day | OPHTHALMIC | Status: DC | PRN

## 2019-04-18 SURGICAL SUPPLY — 46 items
BAG DECANTER FOR FLEXI CONT (MISCELLANEOUS) ×2 IMPLANT
BLADE SAW SGTL 18X1.27X75 (BLADE) ×2 IMPLANT
CELLS DAT CNTRL 66122 CELL SVR (MISCELLANEOUS) ×1 IMPLANT
COVER PERINEAL POST (MISCELLANEOUS) ×2 IMPLANT
COVER SURGICAL LIGHT HANDLE (MISCELLANEOUS) ×2 IMPLANT
COVER WAND RF STERILE (DRAPES) IMPLANT
CUP GRIPTON 48MM 100 HIP (Hips) ×1 IMPLANT
DECANTER SPIKE VIAL GLASS SM (MISCELLANEOUS) ×2 IMPLANT
DRAPE IMP U-DRAPE 54X76 (DRAPES) ×2 IMPLANT
DRAPE STERI IOBAN 125X83 (DRAPES) ×2 IMPLANT
DRAPE U-SHAPE 47X51 STRL (DRAPES) ×4 IMPLANT
DRESSING AQUACEL AG SP 3.5X6 (GAUZE/BANDAGES/DRESSINGS) IMPLANT
DRSG AQUACEL AG ADV 3.5X 6 (GAUZE/BANDAGES/DRESSINGS) ×2 IMPLANT
DRSG AQUACEL AG SP 3.5X6 (GAUZE/BANDAGES/DRESSINGS) ×2
DURAPREP 26ML APPLICATOR (WOUND CARE) ×2 IMPLANT
ELECT BLADE TIP CTD 4 INCH (ELECTRODE) ×2 IMPLANT
ELECT REM PT RETURN 15FT ADLT (MISCELLANEOUS) ×2 IMPLANT
ELIMINATOR HOLE APEX DEPUY (Hips) ×1 IMPLANT
GLOVE BIO SURGEON STRL SZ8 (GLOVE) ×4 IMPLANT
GLOVE BIOGEL PI IND STRL 8 (GLOVE) ×2 IMPLANT
GLOVE BIOGEL PI INDICATOR 8 (GLOVE) ×2
GOWN STRL REUS W/TWL XL LVL3 (GOWN DISPOSABLE) ×4 IMPLANT
HEAD FEM STD 32X+1 STRL (Hips) ×1 IMPLANT
HOLDER FOLEY CATH W/STRAP (MISCELLANEOUS) ×2 IMPLANT
KIT TURNOVER KIT A (KITS) IMPLANT
MANIFOLD NEPTUNE II (INSTRUMENTS) ×2 IMPLANT
NEEDLE HYPO 22GX1.5 SAFETY (NEEDLE) ×2 IMPLANT
NS IRRIG 1000ML POUR BTL (IV SOLUTION) ×2 IMPLANT
PACK ANTERIOR HIP CUSTOM (KITS) ×2 IMPLANT
PINN ALTRX NEUT ID X OD 32X48 ×1 IMPLANT
PROTECTOR NERVE ULNAR (MISCELLANEOUS) ×2 IMPLANT
RETRACTOR WND ALEXIS 18 MED (MISCELLANEOUS) ×1 IMPLANT
RTRCTR WOUND ALEXIS 18CM MED (MISCELLANEOUS) ×2
STEM FEM ACTIS HIGH SZ3 (Stem) ×1 IMPLANT
SUT ETHIBOND NAB CT1 #1 30IN (SUTURE) ×4 IMPLANT
SUT VIC AB 1 CT1 36 (SUTURE) ×2 IMPLANT
SUT VIC AB 2-0 CT1 27 (SUTURE) ×1
SUT VIC AB 2-0 CT1 TAPERPNT 27 (SUTURE) ×1 IMPLANT
SUT VIC AB 3-0 PS2 18 (SUTURE) ×1
SUT VIC AB 3-0 PS2 18XBRD (SUTURE) ×1 IMPLANT
SUT VLOC 180 0 24IN GS25 (SUTURE) ×2 IMPLANT
SYR 50ML LL SCALE MARK (SYRINGE) ×2 IMPLANT
TRAY FOLEY MTR SLVR 14FR STAT (SET/KITS/TRAYS/PACK) ×1 IMPLANT
TRAY FOLEY MTR SLVR 16FR STAT (SET/KITS/TRAYS/PACK) ×2 IMPLANT
WATER STERILE IRR 1000ML POUR (IV SOLUTION) ×2 IMPLANT
YANKAUER SUCT BULB TIP 10FT TU (MISCELLANEOUS) ×2 IMPLANT

## 2019-04-18 NOTE — Op Note (Signed)

## 2019-04-18 NOTE — Anesthesia Postprocedure Evaluation (Signed)
Anesthesia Post Note  Patient: Wendy Patton  Procedure(s) Performed: RIGHT TOTAL HIP ARTHROPLASTY ANTERIOR APPROACH (Right Hip)     Patient location during evaluation: PACU Anesthesia Type: MAC and Spinal Level of consciousness: oriented and awake and alert Pain management: pain level controlled Vital Signs Assessment: post-procedure vital signs reviewed and stable Respiratory status: spontaneous breathing, respiratory function stable and patient connected to nasal cannula oxygen Cardiovascular status: blood pressure returned to baseline and stable Postop Assessment: no headache, no backache, no apparent nausea or vomiting, spinal receding and patient able to bend at knees Anesthetic complications: no    Last Vitals:  Vitals:   04/18/19 1615 04/18/19 1632  BP: (!) 156/78 (!) 168/78  Pulse: (!) 56 64  Resp: 17 17  Temp: (!) 36.4 C (!) 36.3 C  SpO2: 100% 100%    Last Pain:  Vitals:   04/18/19 1632  TempSrc: Oral  PainSc:                  Wendy Patton,[name]

## 2019-04-18 NOTE — Interval H&P Note (Signed)
History and Physical Interval Note:  04/18/2019 11:27 AM  Wendy Patton  has presented today for surgery, with the diagnosis of RIGHT HIP DEGENERATIVE JOINT DISEASE.  The various methods of treatment have been discussed with the patient and family. After consideration of risks, benefits and other options for treatment, the patient has consented to  Procedure(s): RIGHT TOTAL HIP ARTHROPLASTY ANTERIOR APPROACH (Right) as a surgical intervention.  The patient's history has been reviewed, patient examined, no change in status, stable for surgery.  I have reviewed the patient's chart and labs.  Questions were answered to the patient's satisfaction.     [name]

## 2019-04-18 NOTE — Anesthesia Procedure Notes (Signed)
Spinal  Patient location during procedure: OR Start time: 04/18/2019 12:37 PM End time: 04/18/2019 12:43 PM Staffing Anesthesiologist: Roderic Palau, MD Performed: anesthesiologist  Preanesthetic Checklist Completed: patient identified, surgical consent, pre-op evaluation, timeout performed, IV checked, risks and benefits discussed and monitors and equipment checked Spinal Block Patient position: sitting Prep: DuraPrep Patient monitoring: cardiac monitor, continuous pulse ox and blood pressure Approach: midline Location: L2-3 (Attempted at L3-4. Unable to locate space.) Injection technique: single-shot Needle Needle type: Pencan  Needle gauge: 24 G Needle length: 9 cm Assessment Sensory level: T8 Additional Notes Functioning IV was confirmed and monitors were applied. Sterile prep and drape, including hand hygiene and sterile gloves were used. The patient was positioned and the spine was prepped. The skin was anesthetized with lidocaine.  Free flow of clear CSF was obtained prior to injecting local anesthetic into the CSF.  The spinal needle aspirated freely following injection.  The needle was carefully withdrawn.  The patient tolerated the procedure well.

## 2019-04-18 NOTE — Evaluation (Signed)
Physical Therapy Evaluation Patient Details Name: Wendy Patton MRN: [id number] DOB: 02-Feb-1936 Today's Date: 04/18/2019   History of Present Illness  Patient is 83 y.o. female s/p Rt THA, direct ant approach, on 04/18/19 with PMH sig for GERD, thyroid disease, prior Lt THA, and multiple jaw surgeries.    Clinical Impression  Wendy Patton is a 84 y.o. female POD 0 s/p Rt THA direct anterior approach. She reports independence with no device to mobilize at baseline and that she was participating in yoga 1x/week and therapy for her knee pain prior to COVID-19 shutdowns. She is now limited by functional impairments (see PT Problem list below) and requires min assist for bed mobility, and transfers and gait with RW. She was able to ambulate ~ 100' today with cues for safe use of RW and mild antalgia, no overt LOB noted. She will benefit from additional skilled PT interventions to address impairments to progress to PLOF. Acute PT will follow to provide mobility and stair training as well as initial HEP in preparation for safe discharge home.    Follow Up Recommendations Follow surgeon's recommendation for DC plan and follow-up therapies    Equipment Recommendations  None recommended by PT    Recommendations for Other Services       Precautions / Restrictions Precautions Precautions: Fall Restrictions Weight Bearing Restrictions: Yes RLE Weight Bearing: Weight bearing as tolerated      Mobility  Bed Mobility Overal bed mobility: Needs Assistance Bed Mobility: Supine to Sit;Sit to Supine     Supine to sit: Min assist;HOB elevated Sit to supine: Min assist;HOB elevated   General bed mobility comments: min assist for Rt LE management, pt able to sequence transfer without cues, used bed rails, HOB slightly elevated  Transfers Overall transfer level: Needs assistance Equipment used: Rolling walker (2 wheeled) Transfers: Sit to/from Stand Sit to Stand: Min assist          General transfer comment: cues for safe hand placement and min assist to initiate power up to stand, pt reports she has difficulty standing up even prior to surgery  Ambulation/Gait Ambulation/Gait assistance: Min guard Gait Distance (Feet): 100 Feet Assistive device: Rolling walker (2 wheeled) Gait Pattern/deviations: Decreased stride length;Decreased stance time - right;Decreased step length - right Gait velocity: decreased   General Gait Details: pt required cues for safe hand placement and management of RW to maintain safe proximety, no over LOB noted  Stairs            Wheelchair Mobility    Modified Rankin (Stroke Patients Only)       Balance Overall balance assessment: Needs assistance Sitting-balance support: Feet supported;No upper extremity supported Sitting balance-Leahy Scale: Good     Standing balance support: During functional activity;Bilateral upper extremity supported Standing balance-Leahy Scale: Fair Standing balance comment: pt is able to maintain static stance without support, requires UE support for dynamic standing and gait                Pertinent Vitals/Pain Pain Assessment: Faces Faces Pain Scale: Hurts a little bit Pain Location: Rt hip Pain Descriptors / Indicators: Sore;Guarding Pain Intervention(s): Limited activity within patient's tolerance;Monitored during session;Ice applied    Home Living Family/patient expects to be discharged to:: Private residence Living Arrangements: Spouse/significant other Available Help at Discharge: Family;Available 24 hours/day Type of Home: House Home Access: Stairs to enter   [employer] of Steps: 2+1 at side door, no rails Home Layout: Two level;Full bath on main level;Able to  live on main level with bedroom/bathroom Home Equipment: Gilford Rile - 2 wheels;Cane - single point;Shower seat;Grab bars - tub/shower Additional Comments: pt's husband is a retired [employer] manager and assists her at home if  needed, she is independent with mobility and ADL's at baseline with no device    Prior Function Level of Independence: Independent         Comments: pt participates in yoga 1x/week and was donig OPPT for her knee pain prior to COVID-19     Hand Dominance        Extremity/Trunk Assessment   Upper Extremity Assessment Upper Extremity Assessment: Overall WFL for tasks assessed    Lower Extremity Assessment Lower Extremity Assessment: Overall WFL for tasks assessed    Cervical / Trunk Assessment Cervical / Trunk Assessment: Normal  Communication   Communication: No difficulties  Cognition Arousal/Alertness: Awake/alert Behavior During Therapy: WFL for tasks assessed/performed Overall Cognitive Status: Within Functional Limits for tasks assessed         General Comments      Exercises Total Joint Exercises Ankle Circles/Pumps: AROM;Both;10 reps;Supine Quad Sets: AROM;Right;5 reps;Supine Heel Slides: AROM;Right;5 reps;Supine Hip ABduction/ADduction: AROM;Right;5 reps   Assessment/Plan    PT Assessment Patient needs continued PT services  PT Problem List Decreased strength;Decreased activity tolerance;Decreased mobility;Decreased range of motion;Decreased balance       PT Treatment Interventions DME instruction;Stair training;Therapeutic activities;Balance training;Modalities;Manual techniques;Patient/family education;Neuromuscular re-education;Therapeutic exercise;Functional mobility training;Gait training    PT Goals (Current goals can be found in the Care Plan section)  Acute Rehab PT Goals Patient Stated Goal: to get back to PT for her knee pain and start walking more again PT Goal Formulation: With patient Time For Goal Achievement: 04/25/19 Potential to Achieve Goals: Good    Frequency 7X/week    AM-PAC PT "6 Clicks" Mobility  Outcome Measure Help needed turning from your back to your side while in a flat bed without using bedrails?: A Little Help  needed moving from lying on your back to sitting on the side of a flat bed without using bedrails?: A Little Help needed moving to and from a bed to a chair (including a wheelchair)?: A Little Help needed standing up from a chair using your arms (e.g., wheelchair or bedside chair)?: A Little Help needed to walk in hospital room?: A Little Help needed climbing 3-5 steps with a railing? : A Little 6 Click Score: 18    End of Session Equipment Utilized During Treatment: Gait belt Activity Tolerance: Patient tolerated treatment well Patient left: in bed;with bed alarm set;with call bell/phone within reach Nurse Communication: Mobility status PT Visit Diagnosis: Unsteadiness on feet (R26.81);Other abnormalities of gait and mobility (R26.89);Muscle weakness (generalized) (M62.81);Difficulty in walking, not elsewhere classified (R26.2)    Time: [id number] PT Time Calculation (min) (ACUTE ONLY): 33 min   Charges:   PT Evaluation $PT Eval Low Complexity: 1 Low PT Treatments $Therapeutic Exercise: 8-22 mins        Kipp Brood, PT, DPT, [facility] Physical Therapist with [facility]  04/18/2019 7:19 PM

## 2019-04-18 NOTE — Transfer of Care (Signed)
Immediate Anesthesia Transfer of Care Note  Patient: Wendy Patton  Procedure(s) Performed: Procedure(s): RIGHT TOTAL HIP ARTHROPLASTY ANTERIOR APPROACH (Right)  Patient Location: PACU  Anesthesia Type:Spinal  Level of Consciousness:  sedated, patient cooperative and responds to stimulation  Airway & Oxygen Therapy:Patient Spontanous Breathing and Patient connected to face mask oxgen  Post-op Assessment:  Report given to PACU RN and Post -op Vital signs reviewed and stable  Post vital signs:  Reviewed and stable  Last Vitals:  Vitals:   04/18/19 1018 04/18/19 1419  BP: (!) 158/76 121/63  Pulse: 74 (!) 56  Resp: 18 17  Temp: 36.8 C   SpO2: [id number]    Complications: No apparent anesthesia complications

## 2019-04-19 ENCOUNTER — Encounter (HOSPITAL_COMMUNITY): Payer: Self-pay | Admitting: Orthopaedic Surgery

## 2019-04-19 DIAGNOSIS — M1611 Unilateral primary osteoarthritis, right hip: Secondary | ICD-10-CM | POA: Diagnosis not present

## 2019-04-19 MED ORDER — ASPIRIN 81 MG PO CHEW: 81.0000 mg | CHEWABLE_TABLET | Freq: Two times a day (BID) | ORAL | 0 refills | Status: DC

## 2019-04-19 MED ORDER — TRAMADOL HCL 50 MG PO TABS: 50.0000 mg | ORAL_TABLET | Freq: Four times a day (QID) | ORAL | 0 refills | Status: DC | PRN

## 2019-04-19 NOTE — Plan of Care (Signed)
  Problem: Education: Goal: Knowledge of General Education information will improve Description: Including pain rating scale, medication(s)/side effects and non-pharmacologic comfort measures Outcome: Progressing   Problem: Coping: Goal: Level of anxiety will decrease Outcome: Progressing   Problem: Pain Managment: Goal: General experience of comfort will improve Outcome: Progressing   Problem: Pain Management: Goal: Pain level will decrease with appropriate interventions Outcome: Progressing   

## 2019-04-19 NOTE — Progress Notes (Signed)
Subjective: 1 Day Post-Op Procedure(s) (LRB): RIGHT TOTAL HIP ARTHROPLASTY ANTERIOR APPROACH (Right)   Patient doing well and asking when she can go home.  Activity level:  wbat Diet tolerance:  ok Voiding:  Foley out this morning Patient reports pain as mild.    Objective: Vital signs in last 24 hours: Temp:  [96.8 F (36 C)-98.2 F (36.8 C)] 97.9 F (36.6 C) (09/16 0528) Pulse Rate:  [49-83] 70 (09/16 0528) Resp:  [10-18] 18 (09/16 0528) BP: (115-168)/(63-87) 137/71 (09/16 0528) SpO2:  [98 %-100 %] 98 % (09/16 0528)  Labs: No results for input(s): HGB in the last 72 hours. No results for input(s): WBC, RBC, HCT, PLT in the last 72 hours. No results for input(s): NA, K, CL, CO2, BUN, CREATININE, GLUCOSE, CALCIUM in the last 72 hours. No results for input(s): LABPT, INR in the last 72 hours.  Physical Exam:  Neurologically intact ABD soft Neurovascular intact Sensation intact distally Intact pulses distally Dorsiflexion/Plantar flexion intact Incision: dressing C/D/I and no drainage No cellulitis present Compartment soft  Assessment/Plan:  1 Day Post-Op Procedure(s) (LRB): RIGHT TOTAL HIP ARTHROPLASTY ANTERIOR APPROACH (Right) Advance diet Up with therapy D/C IV fluids Discharge home with home health today after PT. Continue on 81mg  ASA BID x 4 weeks post op. Follow up in office 2 weeks post op.  Wendy Patton Wendy Patton 04/19/2019, 8:01 AM

## 2019-04-19 NOTE — Plan of Care (Signed)
resolved 

## 2019-04-19 NOTE — Progress Notes (Signed)
Physical Therapy Treatment Patient Details Name: Wendy Patton MRN: [id number] DOB: 1935-08-26 Today's Date: 04/19/2019    History of Present Illness Patient is 83 y.o. female s/p Rt THA, direct ant approach, on 04/18/19 with PMH sig for GERD, thyroid disease, prior Lt THA, and multiple jaw surgeries.    PT Comments    POD # 1 pm session Spouse present.  Assisted with amb, stairs Then returned to room to perform some TE's following HEP handout.  Instructed on proper tech, freq as well as use of ICE.   Addressed all mobility questions, discussed appropriate activity, educated on use of ICE.  Pt ready for D/C to home.   Follow Up Recommendations  Follow surgeon's recommendation for DC plan and follow-up therapies     Equipment Recommendations  None recommended by PT    Recommendations for Other Services       Precautions / Restrictions Precautions Precautions: Fall Restrictions Weight Bearing Restrictions: No RLE Weight Bearing: Weight bearing as tolerated    Mobility  Bed Mobility               General bed mobility comments: OOB in recliner  Transfers Overall transfer level: Needs assistance Equipment used: Rolling walker (2 wheeled) Transfers: Sit to/from Stand Sit to Stand: Supervision;Min guard         General transfer comment: 25% VC's on safety with turns using walker  Ambulation/Gait Ambulation/Gait assistance: Supervision;Min guard Gait Distance (Feet): 85 Feet Assistive device: Rolling walker (2 wheeled) Gait Pattern/deviations: Decreased stride length;Decreased stance time - right;Decreased step length - right Gait velocity: decreased   General Gait Details: with spouse present instructed on safe handling   Stairs Stairs: Yes Stairs assistance: Min assist Stair Management: No rails;Step to pattern;Forwards;With walker Number of Stairs: 2 General stair comments: 25% VC's on proper walker placement and 25% VC's on proper sequencing with  spouse present for "hands on" education   Wheelchair Mobility    Modified Rankin (Stroke Patients Only)       Balance                                            Cognition Arousal/Alertness: Awake/alert Behavior During Therapy: WFL for tasks assessed/performed Overall Cognitive Status: Within Functional Limits for tasks assessed                                        Exercises   Total Hip Replacement TE's 10 reps ankle pumps 10 reps knee presses 10 reps heel slides 10 reps SAQ's 10 reps ABD 10 reps all standing TE's  Followed by ICE     General Comments        Pertinent Vitals/Pain Pain Assessment: 0-10 Pain Score: 2  Pain Location: Rt hip Pain Descriptors / Indicators: Sore;Guarding Pain Intervention(s): Monitored during session;Premedicated before session;Repositioned;Ice applied    Home Living                      Prior Function            PT Goals (current goals can now be found in the care plan section) Progress towards PT goals: Progressing toward goals    Frequency    7X/week      PT Plan Current plan remains appropriate  Co-evaluation              AM-PAC PT "6 Clicks" Mobility   Outcome Measure  Help needed turning from your back to your side while in a flat bed without using bedrails?: A Little Help needed moving from lying on your back to sitting on the side of a flat bed without using bedrails?: A Little Help needed moving to and from a bed to a chair (including a wheelchair)?: A Little Help needed standing up from a chair using your arms (e.g., wheelchair or bedside chair)?: A Little Help needed to walk in hospital room?: A Little Help needed climbing 3-5 steps with a railing? : A Little 6 Click Score: 18    End of Session Equipment Utilized During Treatment: Gait belt Activity Tolerance: Patient tolerated treatment well Patient left: in chair;with call bell/phone within  reach Nurse Communication: Mobility status PT Visit Diagnosis: Unsteadiness on feet (R26.81);Other abnormalities of gait and mobility (R26.89);Muscle weakness (generalized) (M62.81);Difficulty in walking, not elsewhere classified (R26.2)      Time: [id number] PT Time Calculation (min) (ACUTE ONLY): 24 min  Charges:  $Gait Training: 8-22 mins $Therapeutic Exercise: 8-22 mins                     Rica Koyanagi  PTA Acute  Rehabilitation Services Pager      [phone] Office      [phone]

## 2019-04-19 NOTE — Progress Notes (Signed)
Physical Therapy Treatment Patient Details Name: Wendy Patton MRN: [id number] DOB: 1936/03/14 Today's Date: 04/19/2019    History of Present Illness Patient is 83 y.o. female s/p Rt THA, direct ant approach, on 04/18/19 with PMH sig for GERD, thyroid disease, prior Lt THA, and multiple jaw surgeries.    PT Comments    POD # 1 am session Assisted with amb in hallway and practiced stairs.  Pt will need another PT session when sopouse arrives to review stairs again and HEP.   Follow Up Recommendations  Follow surgeon's recommendation for DC plan and follow-up therapies     Equipment Recommendations  None recommended by PT    Recommendations for Other Services       Precautions / Restrictions Precautions Precautions: Fall Restrictions Weight Bearing Restrictions: No RLE Weight Bearing: Weight bearing as tolerated    Mobility  Bed Mobility               General bed mobility comments: OOB in recliner  Transfers Overall transfer level: Needs assistance Equipment used: Rolling walker (2 wheeled) Transfers: Sit to/from Stand Sit to Stand: Supervision;Min guard         General transfer comment: 25% VC's on safety with turns using walker  Ambulation/Gait Ambulation/Gait assistance: Supervision;Min guard Gait Distance (Feet): 115 Feet Assistive device: Rolling walker (2 wheeled) Gait Pattern/deviations: Decreased stride length;Decreased stance time - right;Decreased step length - right Gait velocity: decreased   General Gait Details: pt required cues for safe hand placement and management of RW esp with turns   Stairs Stairs: Yes Stairs assistance: Min assist Stair Management: No rails;Step to pattern;Forwards;With walker Number of Stairs: 2 General stair comments: 50% VC's on proper walker placement and 25% VC's on proper sequencing   Wheelchair Mobility    Modified Rankin (Stroke Patients Only)       Balance                                             Cognition Arousal/Alertness: Awake/alert Behavior During Therapy: WFL for tasks assessed/performed Overall Cognitive Status: Within Functional Limits for tasks assessed                                        Exercises      General Comments        Pertinent Vitals/Pain Pain Assessment: 0-10 Pain Score: 2  Pain Location: Rt hip Pain Descriptors / Indicators: Sore;Guarding Pain Intervention(s): Monitored during session;Premedicated before session;Repositioned;Ice applied    Home Living                      Prior Function            PT Goals (current goals can now be found in the care plan section) Progress towards PT goals: Progressing toward goals    Frequency    7X/week      PT Plan Current plan remains appropriate    Co-evaluation              AM-PAC PT "6 Clicks" Mobility   Outcome Measure  Help needed turning from your back to your side while in a flat bed without using bedrails?: A Little Help needed moving from lying on your back to sitting on the side of  a flat bed without using bedrails?: A Little Help needed moving to and from a bed to a chair (including a wheelchair)?: A Little Help needed standing up from a chair using your arms (e.g., wheelchair or bedside chair)?: A Little Help needed to walk in hospital room?: A Little Help needed climbing 3-5 steps with a railing? : A Little 6 Click Score: 18    End of Session Equipment Utilized During Treatment: Gait belt Activity Tolerance: Patient tolerated treatment well Patient left: in chair;with call bell/phone within reach Nurse Communication: Mobility status PT Visit Diagnosis: Unsteadiness on feet (R26.81);Other abnormalities of gait and mobility (R26.89);Muscle weakness (generalized) (M62.81);Difficulty in walking, not elsewhere classified (R26.2)     Time: [id number] PT Time Calculation (min) (ACUTE ONLY): 15 min  Charges:  $Gait  Training: 8-22 mins                     Rica Koyanagi  PTA Acute  Rehabilitation Services Pager      [phone] Office      [phone]

## 2019-04-19 NOTE — Discharge Summary (Signed)
Patient ID: Wendy Patton MRN: [id number] DOB/AGE: 11/20/1935 83 y.o.  Admit date: 04/18/2019 Discharge date: 04/19/2019  Admission Diagnoses:  Principal Problem:   Primary localized osteoarthritis of right hip Active Problems:   Primary osteoarthritis of right hip   Discharge Diagnoses:  Same  Past Medical History:  Diagnosis Date  . Anemia    after jaw surgery was on iron for short period of time  . Arthritis   . Cancer (HCC)    squamous cell  . Difficult intubation    potential for difficult airway due to limited mouth opening  . Dry skin   . GERD (gastroesophageal reflux disease)    takes Aciphex daily  . Headache(784.0)    hx of migraines   . Heart murmur   . History of migraine    last one Aug 2014 and takes Imitrex daily as needed  . History of staph infection   . IBS (irritable bowel syndrome)   . Joint pain   . Joint swelling   . Laryngopharyngeal reflux   . MVP (mitral valve prolapse)   . Neuralgia   . Neuropathy    takes gabapentin daily  . PONV (postoperative nausea and vomiting)    TMJ surgery several times  . Rheumatic fever    as a child  . Shortness of breath    with exertion  . Thyroid disease     Surgeries: Procedure(s): RIGHT TOTAL HIP ARTHROPLASTY ANTERIOR APPROACH on 04/18/2019   Consultants:   Discharged Condition: Improved  Hospital Course: Wendy Patton is an 83 y.o. female who was admitted 04/18/2019 for operative treatment ofPrimary localized osteoarthritis of right hip. Patient has severe unremitting pain that affects sleep, daily activities, and work/hobbies. After pre-op clearance the patient was taken to the operating room on 04/18/2019 and underwent  Procedure(s): RIGHT TOTAL HIP ARTHROPLASTY ANTERIOR APPROACH.    Patient was given perioperative antibiotics:  Anti-infectives (From admission, onward)   Start     Dose/Rate Route Frequency Ordered Stop   04/18/19 2300  vancomycin (VANCOCIN) IVPB 1000 mg/200 mL premix      1,000 mg 200 mL/hr over 60 Minutes Intravenous Every 12 hours 04/18/19 1628 04/18/19 2340   04/18/19 1000  vancomycin (VANCOCIN) IVPB 1000 mg/200 mL premix     1,000 mg 200 mL/hr over 60 Minutes Intravenous On call to O.R. 04/18/19 0955 04/18/19 1212       Patient was given sequential compression devices, early ambulation, and chemoprophylaxis to prevent DVT.  Patient benefited maximally from hospital stay and there were no complications.    Recent vital signs:  Patient Vitals for the past 24 hrs:  BP Temp Temp src Pulse Resp SpO2  04/19/19 0528 137/71 97.9 F (36.6 C) Oral 70 18 98 %  04/19/19 0055 135/71 97.8 F (36.6 C) Oral 71 16 99 %  04/18/19 2129 (!) 156/75 (!) 97.5 F (36.4 C) Oral 75 16 98 %  04/18/19 1931 (!) 142/74 98 F (36.7 C) Oral 73 18 98 %  04/18/19 1830 (!) 150/68 98.2 F (36.8 C) - 68 16 99 %  04/18/19 1729 (!) 143/66 - - 83 16 99 %  04/18/19 1632 (!) 168/78 (!) 97.4 F (36.3 C) Oral 64 17 100 %  04/18/19 1615 (!) 156/78 (!) 97.5 F (36.4 C) - (!) 56 17 100 %  04/18/19 1600 (!) 156/74 - - (!) 49 15 100 %  04/18/19 1545 (!) 150/67 - - (!) 49 14 100 %  04/18/19 1530 133/87 Marland Kitchen)  96.8 F (36 C) - (!) 55 17 100 %  04/18/19 1515 (!) 146/68 - - (!) 53 15 100 %  04/18/19 1500 (!) 142/69 (!) 97.1 F (36.2 C) - (!) 52 10 100 %  04/18/19 1445 140/66 - - (!) 51 16 100 %  04/18/19 1430 115/63 - - (!) 56 17 100 %  04/18/19 1419 121/63 (!) 97 F (36.1 C) - (!) 56 17 100 %  04/18/19 1018 (!) 158/76 98.2 F (36.8 C) Oral 74 18 98 %     Recent laboratory studies: No results for input(s): WBC, HGB, HCT, PLT, NA, K, CL, CO2, BUN, CREATININE, GLUCOSE, INR, CALCIUM in the last 72 hours.  Invalid input(s): PT, 2   Discharge Medications:   Allergies as of 04/19/2019      Reactions   Avelox [moxifloxacin] Other (See Comments)   C.diff colitis   Neurontin [gabapentin] Diarrhea   abd cramping   Nitrofurantoin Monohyd Macro Hives   Percocet [oxycodone-acetaminophen]  Hives   Pulmicort Turbuhaler [budesonide] Other (See Comments)   Blisters in mouth   Amoxicillin Rash   Did it involve swelling of the face/tongue/throat, SOB, or low BP? No Did it involve sudden or severe rash/hives, skin peeling, or any reaction on the inside of your mouth or nose? No Did you need to seek medical attention at a hospital or doctor's office? Unknown When did it last happen?unknown If all above answers are "NO", may proceed with cephalosporin use.   Cefdinir Rash   Chlorhexidine Rash   Ciprofloxacin Hcl Rash   Clindamycin/lincomycin Rash   Doxycycline Rash   Entex Hives, Rash   Hydrocodone Rash   Other Rash   Dial soap    Tavist-d [albertsons Dayhist-d] Rash   Tizanidine Rash   Witch Hazel Rash      Medication List    TAKE these medications   acetaminophen 650 MG CR tablet Commonly known as: TYLENOL Take 650 mg by mouth 2 (two) times daily as needed for pain.   aspirin 81 MG chewable tablet Chew 1 tablet (81 mg total) by mouth 2 (two) times daily.   Calcium 600+D 600-800 MG-UNIT Tabs Generic drug: Calcium Carb-Cholecalciferol Take 1 tablet by mouth 2 (two) times daily.   cycloSPORINE 0.05 % ophthalmic emulsion Commonly known as: RESTASIS Place 1 drop into both eyes 2 (two) times daily.   estradiol 1 MG tablet Commonly known as: ESTRACE Take 1 tablet (1 mg total) by mouth 4 (four) times a week.   Glucosamine HCl 1000 MG Tabs Take 1,000 mg by mouth daily.   Omega-3 1000 MG Caps Take 1,000 mg by mouth daily.   pantoprazole 40 MG tablet Commonly known as: PROTONIX Take 1 tablet (40 mg total) by mouth 2 (two) times daily.   SYSTANE OP Place 1 drop into both eyes daily as needed (dry eyes).   traMADol 50 MG tablet Commonly known as: ULTRAM Take 1 tablet (50 mg total) by mouth every 6 (six) hours as needed for moderate pain or severe pain.   vitamin B-12 1000 MCG tablet Commonly known as: CYANOCOBALAMIN Take 1,000 mcg by mouth daily.    Vitamin D 50 MCG (2000 UT) tablet Take 4,000 Units by mouth daily.   vitamin E 1000 UNIT capsule Take 1,000 Units by mouth daily.            Durable Medical Equipment  (From admission, onward)         Start     Ordered   04/18/19  [facility]  DME Walker rolling  Once    Question:  Patient needs a walker to treat with the following condition  Answer:  Primary osteoarthritis of right hip   04/18/19 1628   04/18/19 1629  DME 3 n 1  Once     04/18/19 1628   04/18/19 1629  DME Bedside commode  Once    Question:  Patient needs a bedside commode to treat with the following condition  Answer:  Primary osteoarthritis of right hip   04/18/19 1628          Diagnostic Studies: Dg Chest 2 View  Result Date: 04/13/2019 CLINICAL DATA:  Preoperative radiograph. EXAM: CHEST - 2 VIEW COMPARISON:  September 01, 2013 FINDINGS: Cardiomediastinal silhouette is normal. Mediastinal contours appear intact. There is no evidence of focal airspace consolidation, pleural effusion or pneumothorax. Stable soft tissue nodule in the left upper lobe, stable from 2015 and therefore likely benign. Osseous structures are without acute abnormality. Soft tissues are grossly normal. IMPRESSION: No active cardiopulmonary disease. Electronically Signed   By: Fidela Salisbury M.D.   On: 04/13/2019 17:36   Dg C-arm 1-60 Min-no Report  Result Date: 04/18/2019 Fluoroscopy was utilized by the requesting physician.  No radiographic interpretation.   Dg Hip Operative Unilat W Or W/o Pelvis Right  Result Date: 04/18/2019 CLINICAL DATA:  Right hip arthroplasty EXAM: OPERATIVE right HIP (WITH PELVIS IF PERFORMED) AP VIEWS TECHNIQUE: Fluoroscopic spot image(s) were submitted for interpretation post-operatively. COMPARISON:  None. FINDINGS: C-arm fluoroscopic images were obtained intraoperatively and submitted for post operative interpretation. Right total hip arthroplasty appears appropriately located. Partially visualized left  total hip arthroplasty. Please see the performing provider's procedural report for the fluoroscopy time utilized. IMPRESSION: As above. Electronically Signed   By: Davina Poke M.D.   On: 04/18/2019 14:24   Vas [place] Lower Extremity Venous Reflux  Result Date: 04/04/2019  Lower Venous Reflux Study Indications: Swelling.  Comparison Study: 12/28/2018 Negative DVT study Performing Technologist: Caralee Ates BA, RVT, RDMS  Examination Guidelines: A complete evaluation includes B-mode imaging, spectral Doppler, color Doppler, and power Doppler as needed of all accessible portions of each vessel. Bilateral testing is considered an integral part of a complete examination. Limited examinations for reoccurring indications may be performed as noted. The reflux portion of the exam is performed with the patient in reverse Trendelenburg.  +------+---------------+---------+-----------+----------+--------------+ LEFT  CompressibilityPhasicitySpontaneityPropertiesThrombus Aging +------+---------------+---------+-----------+----------+--------------+ CFV   Full           Yes      Yes                                 +------+---------------+---------+-----------+----------+--------------+ SFJ   Full           Yes      Yes                                 +------+---------------+---------+-----------+----------+--------------+ FV MidFull           Yes      Yes                                 +------+---------------+---------+-----------+----------+--------------+ POP   Full           Yes      Yes                                 +------+---------------+---------+-----------+----------+--------------+  GSV   Full           Yes      Yes                                 +------+---------------+---------+-----------+----------+--------------+ SSV   Full           Yes      Yes                                 +------+---------------+---------+-----------+----------+--------------+    +------------------------------+----------+---------+ VEIN DIAMETERS:               Right (cm)Left (cm) +------------------------------+----------+---------+ GSV at Saphenofemoral junction          0.621     +------------------------------+----------+---------+ GSV at prox thigh                       0.320     +------------------------------+----------+---------+ GSV at mid thigh                        0.451     +------------------------------+----------+---------+ GSV at distal thigh                     0.420     +------------------------------+----------+---------+ GSV at knee                             0.358     +------------------------------+----------+---------+ GSV prox calf                           0.300     +------------------------------+----------+---------+ GSV mid calf                            0.185     +------------------------------+----------+---------+ SSV origin                              0.225     +------------------------------+----------+---------+ SSV prox                                0.232     +------------------------------+----------+---------+ SSV mid                                 0.185     +------------------------------+----------+---------+   Summary: Left: No reflux was noted in the common femoral vein , femoral vein in the thigh, popliteal vein, and great saphenous vein at the saphenofemoral junction. There is no evidence of deep vein thrombosis within the CFV, FV, and POPV in the lower extremity. There is not evidence of superficial thrombosis within the GSV and SSV.  *See table(s) above for measurements and observations. Electronically signed by Curt Jews MD on 04/04/2019 at 2:09:54 PM.    Final     Disposition: Discharge disposition: 01-Home or Self Care       Discharge Instructions    Call MD / Call 911   Complete by: As directed    If you experience chest pain or shortness of breath, CALL 911 and  be transported to  the hospital emergency room.  If you develope a fever above 101 F, pus (white drainage) or increased drainage or redness at the wound, or calf pain, call your surgeon's office.   Constipation Prevention   Complete by: As directed    Drink plenty of fluids.  Prune juice may be helpful.  You may use a stool softener, such as Colace (over the counter) 100 mg twice a day.  Use MiraLax (over the counter) for constipation as needed.   Diet - low sodium heart healthy   Complete by: As directed    Discharge instructions   Complete by: As directed    INSTRUCTIONS AFTER JOINT REPLACEMENT   Remove items at home which could result in a fall. This includes throw rugs or furniture in walking pathways ICE to the affected joint every three hours while awake for 30 minutes at a time, for at least the first 3-5 days, and then as needed for pain and swelling.  Continue to use ice for pain and swelling. You may notice swelling that will progress down to the foot and ankle.  This is normal after surgery.  Elevate your leg when you are not up walking on it.   Continue to use the breathing machine you got in the hospital (incentive spirometer) which will help keep your temperature down.  It is common for your temperature to cycle up and down following surgery, especially at night when you are not up moving around and exerting yourself.  The breathing machine keeps your lungs expanded and your temperature down.   DIET:  As you were doing prior to hospitalization, we recommend a well-balanced diet.  DRESSING / WOUND CARE / SHOWERING  You may shower 3 days after surgery, but keep the wounds dry during showering.  You may use an occlusive plastic wrap (Press'n Seal for example), NO SOAKING/SUBMERGING IN THE BATHTUB.  If the bandage gets wet, change with a clean dry gauze.  If the incision gets wet, pat the wound dry with a clean towel.  ACTIVITY  Increase activity slowly as tolerated, but follow the weight bearing  instructions below.   No driving for 6 weeks or until further direction given by your physician.  You cannot drive while taking narcotics.  No lifting or carrying greater than 10 lbs. until further directed by your surgeon. Avoid periods of inactivity such as sitting longer than an hour when not asleep. This helps prevent blood clots.  You may return to work once you are authorized by your doctor.     WEIGHT BEARING   Weight bearing as tolerated with assist device (walker, cane, etc) as directed, use it as long as suggested by your surgeon or therapist, typically at least 4-6 weeks.   EXERCISES  Results after joint replacement surgery are often greatly improved when you follow the exercise, range of motion and muscle strengthening exercises prescribed by your doctor. Safety measures are also important to protect the joint from further injury. Any time any of these exercises cause you to have increased pain or swelling, decrease what you are doing until you are comfortable again and then slowly increase them. If you have problems or questions, call your caregiver or physical therapist for advice.   Rehabilitation is important following a joint replacement. After just a few days of immobilization, the muscles of the leg can become weakened and shrink (atrophy).  These exercises are designed to build up the tone and strength of the thigh and  leg muscles and to improve motion. Often times heat used for twenty to thirty minutes before working out will loosen up your tissues and help with improving the range of motion but do not use heat for the first two weeks following surgery (sometimes heat can increase post-operative swelling).   These exercises can be done on a training (exercise) mat, on the floor, on a table or on a bed. Use whatever works the best and is most comfortable for you.    Use music or television while you are exercising so that the exercises are a pleasant break in your day. This  will make your life better with the exercises acting as a break in your routine that you can look forward to.   Perform all exercises about fifteen times, three times per day or as directed.  You should exercise both the operative leg and the other leg as well.   Exercises include:   Quad Sets - Tighten up the muscle on the front of the thigh (Quad) and hold for 5-10 seconds.   Straight Leg Raises - With your knee straight (if you were given a brace, keep it on), lift the leg to 60 degrees, hold for 3 seconds, and slowly lower the leg.  Perform this exercise against resistance later as your leg gets stronger.  Leg Slides: Lying on your back, slowly slide your foot toward your buttocks, bending your knee up off the floor (only go as far as is comfortable). Then slowly slide your foot back down until your leg is flat on the floor again.  Angel Wings: Lying on your back spread your legs to the side as far apart as you can without causing discomfort.  Hamstring Strength:  Lying on your back, push your heel against the floor with your leg straight by tightening up the muscles of your buttocks.  Repeat, but this time bend your knee to a comfortable angle, and push your heel against the floor.  You may put a pillow under the heel to make it more comfortable if necessary.   A rehabilitation program following joint replacement surgery can speed recovery and prevent re-injury in the future due to weakened muscles. Contact your doctor or a physical therapist for more information on knee rehabilitation.    CONSTIPATION  Constipation is defined medically as fewer than three stools per week and severe constipation as less than one stool per week.  Even if you have a regular bowel pattern at home, your normal regimen is likely to be disrupted due to multiple reasons following surgery.  Combination of anesthesia, postoperative narcotics, change in appetite and fluid intake all can affect your bowels.   YOU MUST use  at least one of the following options; they are listed in order of increasing strength to get the job done.  They are all available over the counter, and you may need to use some, POSSIBLY even all of these options:    Drink plenty of fluids (prune juice may be helpful) and high fiber foods Colace 100 mg by mouth twice a day  Senokot for constipation as directed and as needed Dulcolax (bisacodyl), take with full glass of water  Miralax (polyethylene glycol) once or twice a day as needed.  If you have tried all these things and are unable to have a bowel movement in the first 3-4 days after surgery call either your surgeon or your primary doctor.    If you experience loose stools or diarrhea, hold the medications  until you stool forms back up.  If your symptoms do not get better within 1 week or if they get worse, check with your doctor.  If you experience "the worst abdominal pain ever" or develop nausea or vomiting, please contact the office immediately for further recommendations for treatment.   ITCHING:  If you experience itching with your medications, try taking only a single pain pill, or even half a pain pill at a time.  You can also use Benadryl over the counter for itching or also to help with sleep.   TED HOSE STOCKINGS:  Use stockings on both legs until for at least 2 weeks or as directed by physician office. They may be removed at night for sleeping.  MEDICATIONS:  See your medication summary on the "After Visit Summary" that nursing will review with you.  You may have some home medications which will be placed on hold until you complete the course of blood thinner medication.  It is important for you to complete the blood thinner medication as prescribed.  PRECAUTIONS:  If you experience chest pain or shortness of breath - call 911 immediately for transfer to the hospital emergency department.   If you develop a fever greater that 101 F, purulent drainage from wound, increased  redness or drainage from wound, foul odor from the wound/dressing, or calf pain - CONTACT YOUR SURGEON.                                                   FOLLOW-UP APPOINTMENTS:  If you do not already have a post-op appointment, please call the office for an appointment to be seen by your surgeon.  Guidelines for how soon to be seen are listed in your "After Visit Summary", but are typically between 1-4 weeks after surgery.  OTHER INSTRUCTIONS:   Knee Replacement:  Do not place pillow under knee, focus on keeping the knee straight while resting. CPM instructions: 0-90 degrees, 2 hours in the morning, 2 hours in the afternoon, and 2 hours in the evening. Place foam block, curve side up under heel at all times except when in CPM or when walking.  DO NOT modify, tear, cut, or change the foam block in any way.  MAKE SURE YOU:  Understand these instructions.  Get help right away if you are not doing well or get worse.    Thank you for letting us be a part of your medical care team.  It is a privilege we respect greatly.  We hope these instructions will help you stay on track for a fast and full recovery!   Increase activity slowly as tolerated   Complete by: As directed       Follow-up Information    Melrose Nakayama, MD In 2 weeks.   Specialty: Orthopedic Surgery Contact information: [place] [address] [phone]            Signed: [name] Isaia Hassell 04/19/2019, 8:06 AM

## 2019-04-21 DIAGNOSIS — G629 Polyneuropathy, unspecified: Secondary | ICD-10-CM | POA: Diagnosis not present

## 2019-04-21 DIAGNOSIS — G43909 Migraine, unspecified, not intractable, without status migrainosus: Secondary | ICD-10-CM | POA: Diagnosis not present

## 2019-04-21 DIAGNOSIS — I341 Nonrheumatic mitral (valve) prolapse: Secondary | ICD-10-CM | POA: Diagnosis not present

## 2019-04-21 DIAGNOSIS — Z471 Aftercare following joint replacement surgery: Secondary | ICD-10-CM | POA: Diagnosis not present

## 2019-04-21 DIAGNOSIS — K589 Irritable bowel syndrome without diarrhea: Secondary | ICD-10-CM | POA: Diagnosis not present

## 2019-04-21 DIAGNOSIS — Z9181 History of falling: Secondary | ICD-10-CM | POA: Diagnosis not present

## 2019-04-21 DIAGNOSIS — Z7982 Long term (current) use of aspirin: Secondary | ICD-10-CM | POA: Diagnosis not present

## 2019-04-21 DIAGNOSIS — D649 Anemia, unspecified: Secondary | ICD-10-CM | POA: Diagnosis not present

## 2019-04-21 DIAGNOSIS — K219 Gastro-esophageal reflux disease without esophagitis: Secondary | ICD-10-CM | POA: Diagnosis not present

## 2019-04-21 DIAGNOSIS — Z96641 Presence of right artificial hip joint: Secondary | ICD-10-CM | POA: Diagnosis not present

## 2019-04-21 DIAGNOSIS — E079 Disorder of thyroid, unspecified: Secondary | ICD-10-CM | POA: Diagnosis not present

## 2019-04-23 ENCOUNTER — Emergency Department: Payer: PPO

## 2019-04-23 ENCOUNTER — Emergency Department
Admission: EM | Admit: 2019-04-23 | Discharge: 2019-04-23 | Disposition: A | Discharge status: Home / Self Care | Payer: PPO | Attending: Emergency Medicine | Admitting: Emergency Medicine

## 2019-04-23 ENCOUNTER — Encounter: Payer: Self-pay | Admitting: Emergency Medicine

## 2019-04-23 ENCOUNTER — Other Ambulatory Visit: Payer: Self-pay

## 2019-04-23 DIAGNOSIS — T84020A Dislocation of internal right hip prosthesis, initial encounter: Secondary | ICD-10-CM | POA: Diagnosis not present

## 2019-04-23 DIAGNOSIS — Z79899 Other long term (current) drug therapy: Secondary | ICD-10-CM | POA: Insufficient documentation

## 2019-04-23 DIAGNOSIS — S299XXA Unspecified injury of thorax, initial encounter: Secondary | ICD-10-CM | POA: Diagnosis not present

## 2019-04-23 DIAGNOSIS — Y792 Prosthetic and other implants, materials and accessory orthopedic devices associated with adverse incidents: Secondary | ICD-10-CM | POA: Diagnosis not present

## 2019-04-23 DIAGNOSIS — S79911A Unspecified injury of right hip, initial encounter: Secondary | ICD-10-CM | POA: Diagnosis present

## 2019-04-23 DIAGNOSIS — W19XXXA Unspecified fall, initial encounter: Secondary | ICD-10-CM | POA: Diagnosis not present

## 2019-04-23 DIAGNOSIS — Y999 Unspecified external cause status: Secondary | ICD-10-CM | POA: Diagnosis not present

## 2019-04-23 DIAGNOSIS — I10 Essential (primary) hypertension: Secondary | ICD-10-CM | POA: Diagnosis not present

## 2019-04-23 DIAGNOSIS — S73004D Unspecified dislocation of right hip, subsequent encounter: Secondary | ICD-10-CM | POA: Diagnosis not present

## 2019-04-23 DIAGNOSIS — W1839XA Other fall on same level, initial encounter: Secondary | ICD-10-CM | POA: Insufficient documentation

## 2019-04-23 DIAGNOSIS — Y939 Activity, unspecified: Secondary | ICD-10-CM | POA: Diagnosis not present

## 2019-04-23 DIAGNOSIS — M25551 Pain in right hip: Secondary | ICD-10-CM | POA: Diagnosis not present

## 2019-04-23 DIAGNOSIS — Z85828 Personal history of other malignant neoplasm of skin: Secondary | ICD-10-CM | POA: Insufficient documentation

## 2019-04-23 DIAGNOSIS — R001 Bradycardia, unspecified: Secondary | ICD-10-CM | POA: Diagnosis not present

## 2019-04-23 DIAGNOSIS — Z7982 Long term (current) use of aspirin: Secondary | ICD-10-CM | POA: Insufficient documentation

## 2019-04-23 DIAGNOSIS — Y929 Unspecified place or not applicable: Secondary | ICD-10-CM | POA: Diagnosis not present

## 2019-04-23 DIAGNOSIS — R52 Pain, unspecified: Secondary | ICD-10-CM | POA: Diagnosis not present

## 2019-04-23 LAB — CBC WITH DIFFERENTIAL/PLATELET
Abs Immature Granulocytes: 0.04 10*3/uL (ref 0.00–0.07)
Basophils Absolute: 0 10*3/uL (ref 0.0–0.1)
Basophils Relative: 0 %
Eosinophils Absolute: 0.3 10*3/uL (ref 0.0–0.5)
Eosinophils Relative: 4 %
HCT: 34.2 % — ABNORMAL LOW (ref 36.0–46.0)
Hemoglobin: 11.4 g/dL — ABNORMAL LOW (ref 12.0–15.0)
Immature Granulocytes: 1 %
Lymphocytes Relative: 10 %
Lymphs Abs: 0.7 10*3/uL (ref 0.7–4.0)
MCH: 28.3 pg (ref 26.0–34.0)
MCHC: 33.3 g/dL (ref 30.0–36.0)
MCV: 84.9 fL (ref 80.0–100.0)
Monocytes Absolute: 0.4 10*3/uL (ref 0.1–1.0)
Monocytes Relative: 5 %
Neutro Abs: 5.6 10*3/uL (ref 1.7–7.7)
Neutrophils Relative %: 80 %
Platelets: 261 10*3/uL (ref 150–400)
RBC: 4.03 MIL/uL (ref 3.87–5.11)
RDW: 13.5 % (ref 11.5–15.5)
WBC: 7 10*3/uL (ref 4.0–10.5)
nRBC: 0 % (ref 0.0–0.2)

## 2019-04-23 LAB — BASIC METABOLIC PANEL
Anion gap: 10 (ref 5–15)
BUN: 12 mg/dL (ref 8–23)
CO2: 25 mmol/L (ref 22–32)
Calcium: 9.2 mg/dL (ref 8.9–10.3)
Chloride: 95 mmol/L — ABNORMAL LOW (ref 98–111)
Creatinine, Ser: 0.88 mg/dL (ref 0.44–1.00)
GFR calc Af Amer: >60 mL/min (ref >60)
GFR calc non Af Amer: >60 mL/min (ref >60)
Glucose, Bld: 169 mg/dL — ABNORMAL HIGH (ref 70–99)
Potassium: 4 mmol/L (ref 3.5–5.1)
Sodium: 130 mmol/L — ABNORMAL LOW (ref 135–145)

## 2019-04-23 LAB — PROTIME-INR
INR: 1 (ref 0.8–1.2)
Prothrombin Time: 13.3 seconds (ref 11.4–15.2)

## 2019-04-23 MED ORDER — MIDAZOLAM HCL 2 MG/2ML IJ SOLN
2.0000 mg | Freq: Once | INTRAMUSCULAR | Status: AC
  Administered 2019-04-23: 2 mg via INTRAVENOUS
  Filled 2019-04-23: qty 2

## 2019-04-23 MED ORDER — FENTANYL CITRATE (PF) 100 MCG/2ML IJ SOLN
INTRAMUSCULAR | Status: AC
  Filled 2019-04-23: qty 2

## 2019-04-23 MED ORDER — FENTANYL CITRATE (PF) 100 MCG/2ML IJ SOLN
50.0000 ug | Freq: Once | INTRAMUSCULAR | Status: AC
  Administered 2019-04-23: 09:00:00 50 ug via INTRAVENOUS
  Filled 2019-04-23: qty 2

## 2019-04-23 MED ORDER — PROPOFOL 10 MG/ML IV BOLUS
0.5000 mg/kg | Freq: Once | INTRAVENOUS | Status: AC
  Administered 2019-04-23: 12:00:00 30.9 mg via INTRAVENOUS
  Filled 2019-04-23: qty 20

## 2019-04-23 MED ORDER — FENTANYL CITRATE (PF) 100 MCG/2ML IJ SOLN
50.0000 ug | Freq: Once | INTRAMUSCULAR | Status: AC
  Administered 2019-04-23: 10:00:00 50 ug via INTRAVENOUS

## 2019-04-23 MED ORDER — MIDAZOLAM HCL 2 MG/2ML IJ SOLN
INTRAMUSCULAR | Status: AC
  Filled 2019-04-23: qty 2

## 2019-04-23 MED ORDER — FENTANYL CITRATE (PF) 100 MCG/2ML IJ SOLN
50.0000 ug | Freq: Once | INTRAMUSCULAR | Status: AC
  Administered 2019-04-23: 50 ug via INTRAVENOUS
  Filled 2019-04-23: qty 2

## 2019-04-23 MED ORDER — FENTANYL CITRATE (PF) 100 MCG/2ML IJ SOLN
50.0000 ug | Freq: Once | INTRAMUSCULAR | Status: AC
  Administered 2019-04-23: 50 ug via INTRAVENOUS

## 2019-04-23 MED ORDER — ETOMIDATE 2 MG/ML IV SOLN
0.3000 mg/kg | Freq: Once | INTRAVENOUS | Status: DC
  Filled 2019-04-23: qty 10

## 2019-04-23 MED ORDER — SODIUM CHLORIDE 0.9 % IV BOLUS
500.0000 mL | Freq: Once | INTRAVENOUS | Status: AC
  Administered 2019-04-23: 500 mL via INTRAVENOUS

## 2019-04-23 MED ORDER — MIDAZOLAM HCL 2 MG/2ML IJ SOLN: 4.0000 mg | Freq: Once | INTRAMUSCULAR | Status: DC

## 2019-04-23 NOTE — Consult Note (Signed)
ORTHOPAEDIC CONSULTATION  PATIENT NAME: Wendy Patton DOB: 04-10-1936  MRN: [id number]  REQUESTING PHYSICIAN: Carrie Mew, MD  Chief Complaint: Right hip prosthetic dislocation  HPI: Wendy Patton is a 83 y.o. female who complains of right hip pain and inability to walk.  Patient underwent right total hip replacement 5 days ago in [place] at [facility] through a direct anterior approach.  She was discharged home and was doing physical therapy.  She had a fall this morning and has been unable to get up and put weight on her right lower extremity.  Patient was brought to the ER at [facility].  X-rays showed evidence of right hip prosthetic dislocation.  Patient denies pain anywhere else in her body.  Past Medical History:  Diagnosis Date  . Anemia    after jaw surgery was on iron for short period of time  . Arthritis   . Cancer (HCC)    squamous cell  . Difficult intubation    potential for difficult airway due to limited mouth opening  . Dry skin   . GERD (gastroesophageal reflux disease)    takes Aciphex daily  . Headache(784.0)    hx of migraines   . Heart murmur   . History of migraine    last one Aug 2014 and takes Imitrex daily as needed  . History of staph infection   . IBS (irritable bowel syndrome)   . Joint pain   . Joint swelling   . Laryngopharyngeal reflux   . MVP (mitral valve prolapse)   . Neuralgia   . Neuropathy    takes gabapentin daily  . PONV (postoperative nausea and vomiting)    TMJ surgery several times  . Rheumatic fever    as a child  . Shortness of breath    with exertion  . Thyroid disease    Past Surgical History:  Procedure Laterality Date  . ABDOMINAL HYSTERECTOMY  2009  . BREAST DUCTAL SYSTEM EXCISION Right 01/11/2014   Procedure: MAJOR DUCT EXCISION RIGHT BREAST;  Surgeon: Stark Klein, MD;  Location: [facility];  Service: General;  Laterality: Right;  . BREAST EXCISIONAL BIOPSY Right 2015   neg   . BREAST EXCISIONAL BIOPSY Left 2010   benign papilloma removed  . BREAST EXCISIONAL BIOPSY Left 1975   neg  . BREAST SURGERY  1975, 2010   benign breast tumor  . breat excision  1975   benign breast tumor excision  . CATARACT EXTRACTION W/PHACO Right 07/15/2016   Procedure: CATARACT EXTRACTION PHACO AND INTRAOCULAR LENS PLACEMENT (IOC);  Surgeon: Estill Cotta, MD;  Location: [facility];  Service: Ophthalmology;  Laterality: Right;  Lot # [id number] H [place]: 01:38.6 AP%: 25.0 CDE: 46.89  . CATARACT EXTRACTION W/PHACO Left 09/02/2016   Procedure: CATARACT EXTRACTION PHACO AND INTRAOCULAR LENS PLACEMENT (IOC);  Surgeon: Estill Cotta, MD;  Location: [facility];  Service: Ophthalmology;  Laterality: Left;  [place] 2:05.3 AP% 24.9 CDE 51.84 Fluid pack lot # [id number]  . cervial polyps  1999  . [place]  . COLONOSCOPY    . CYSTECTOMY  1962   vaginal cyst removal  . DILATION AND CURETTAGE OF UTERUS  1984  . ESOPHAGOGASTRODUODENOSCOPY    . granuloma  2002   chin granuloma removed  . [place], 2002, 2004   prosthesis, condyles implants, fossa replaced  . SIGMOIDOSCOPY  1993  . SQUAMOUS CELL CARCINOMA EXCISION  2008   left ankle/skin graft  .  [place]  . THYROIDECTOMY  2006   left  . TONSILLECTOMY AND ADENOIDECTOMY  1944  . TOTAL HIP ARTHROPLASTY Left 09/12/2013   Procedure: LEFT TOTAL HIP ARTHROPLASTY ANTERIOR APPROACH;  Surgeon: [name], MD;  Location: [facility];  Service: Orthopedics;  Laterality: Left;  . TOTAL HIP ARTHROPLASTY Right 04/18/2019   Procedure: RIGHT TOTAL HIP ARTHROPLASTY ANTERIOR APPROACH;  Surgeon: Melrose Nakayama, MD;  Location: [facility];  Service: Orthopedics;  Laterality: Right;  . TRAPEZIUM RESECTION  2000, 2004   removal right & left hand trapezium  . TUBAL LIGATION    . uterine polyps  2008  . vaginal cyst removed  1962   Social History   Socioeconomic History  . Marital  status: Married    Spouse name: Not on file  . Number of children: Not on file  . Years of education: Not on file  . Highest education level: Not on file  Occupational History  . Not on file  Social Needs  . Financial resource strain: Not on file  . Food insecurity    Worry: Not on file    Inability: Not on file  . Transportation needs    Medical: Not on file    Non-medical: Not on file  Tobacco Use  . Smoking status: Never Smoker  . Smokeless tobacco: Never Used  Substance and Sexual Activity  . Alcohol use: No    Alcohol/week: 0.0 standard drinks  . Drug use: No  . Sexual activity: Yes    Birth control/protection: Surgical  Lifestyle  . Physical activity    Days per week: Not on file    Minutes per session: Not on file  . Stress: Not on file  Relationships  . Social Herbalist on phone: Not on file    Gets together: Not on file    Attends religious service: Not on file    Active member of club or organization: Not on file    Attends meetings of clubs or organizations: Not on file    Relationship status: Not on file  Other Topics Concern  . Not on file  Social History Narrative  . Not on file   Family History  Problem Relation Age of Onset  . Heart attack Maternal Grandmother   . Breast cancer Neg Hx    Allergies  Allergen Reactions  . Avelox [Moxifloxacin] Other (See Comments)    C.diff colitis   . Neurontin [Gabapentin] Diarrhea    abd cramping   . Nitrofurantoin Monohyd Macro Hives  . Percocet [Oxycodone-Acetaminophen] Hives  . Pulmicort Turbuhaler [Budesonide] Other (See Comments)    Blisters in mouth   . Amoxicillin Rash    Did it involve swelling of the face/tongue/throat, SOB, or low BP? No Did it involve sudden or severe rash/hives, skin peeling, or any reaction on the inside of your mouth or nose? No Did you need to seek medical attention at a hospital or doctor's office? Unknown When did it last happen?unknown If all above  answers are "NO", may proceed with cephalosporin use.   . Cefdinir Rash  . Chlorhexidine Rash  . Ciprofloxacin Hcl Rash  . Clindamycin/Lincomycin Rash  . Doxycycline Rash  . Entex Hives and Rash  . Hydrocodone Rash  . Other Rash    Dial soap   . Tavist-D [Albertsons Dayhist-D] Rash  . Tizanidine Rash  . Witch Hazel Rash   Prior to Admission medications   Medication Sig Start Date End Date  Taking? Authorizing Provider  acetaminophen (TYLENOL) 650 MG CR tablet Take 650 mg by mouth 2 (two) times daily as needed for pain.    [provider]  aspirin 81 MG chewable tablet Chew 1 tablet (81 mg total) by mouth 2 (two) times daily. 04/19/19   Loni Dolly, PA-C  Calcium Carb-Cholecalciferol (CALCIUM 600+D) 600-800 MG-UNIT TABS Take 1 tablet by mouth 2 (two) times daily.    [provider]  Cholecalciferol (VITAMIN D) 2000 units tablet Take 4,000 Units by mouth daily.     [provider]  cycloSPORINE (RESTASIS) 0.05 % ophthalmic emulsion Place 1 drop into both eyes 2 (two) times daily.     [provider]  estradiol (ESTRACE) 1 MG tablet Take 1 tablet (1 mg total) by mouth 4 (four) times a week. 02/23/19   Trinna Post, PA-C  Glucosamine HCl 1000 MG TABS Take 1,000 mg by mouth daily.    [provider]  Omega-3 1000 MG CAPS Take 1,000 mg by mouth daily.     [provider]  pantoprazole (PROTONIX) 40 MG tablet Take 1 tablet (40 mg total) by mouth 2 (two) times daily. 01/04/18   Carmon Ginsberg, PA  Polyethyl Glycol-Propyl Glycol (SYSTANE OP) Place 1 drop into both eyes daily as needed (dry eyes).    [provider]  traMADol (ULTRAM) 50 MG tablet Take 1 tablet (50 mg total) by mouth every 6 (six) hours as needed for moderate pain or severe pain. 04/19/19   Loni Dolly, PA-C  vitamin B-12 (CYANOCOBALAMIN) 1000 MCG tablet Take 1,000 mcg by mouth daily.    [provider]  vitamin E 1000 UNIT capsule Take 1,000 Units by mouth  daily.      [provider]   Dg Chest 1 View  Result Date: 04/23/2019 CLINICAL DATA:  Fall, right hip pain EXAM: CHEST  1 VIEW COMPARISON:  04/13/2019 FINDINGS: Stable right upper lobe nodule, chronic dating back to 2010, benign. Left lung is clear. No pleural effusion or pneumothorax. The heart is normal in size. IMPRESSION: No evidence of acute cardiopulmonary disease. Electronically Signed   By: Julian Hy M.D.   On: 04/23/2019 10:14   Dg Pelvis Portable  Result Date: 04/23/2019 CLINICAL DATA:  Post reduction RIGHT hip dislocation. EXAM: PORTABLE PELVIS 1-2 VIEWS COMPARISON:  None. FINDINGS: Single AP view of the pelvis shows bilateral hip arthroplasties. Hardware appears appropriately positioned. Osseous alignment is anatomic. IMPRESSION: Arthroplasty hardware appears appropriately positioned. Electronically Signed   By: Franki Cabot M.D.   On: 04/23/2019 11:59   Dg Hip Unilat W Or Wo Pelvis 2-3 Views Right  Result Date: 04/23/2019 CLINICAL DATA:  Right hip pain after fall EXAM: DG HIP (WITH OR WITHOUT PELVIS) 2-3V RIGHT COMPARISON:  None. FINDINGS: Right hip arthroplasty. Superior/posterior dislocation of the femoral head component relative to the acetabular cup. Left hip arthroplasty, without evidence of complication. Visualized bony pelvis appears intact. Degenerative changes of the lower lumbar spine. IMPRESSION: Right hip arthroplasty with associated superior/posterior dislocation, as described above. Left hip arthroplasty, without evidence of complication. Electronically Signed   By: Julian Hy M.D.   On: 04/23/2019 10:13    Positive ROS: All other systems have been reviewed and were otherwise negative with the exception of those mentioned in the HPI and as above.  Physical Exam: General: Well developed, well nourished female seen in no acute distress. HEENT: Atraumatic and normocephalic. Sclera are clear. Extraocular motion is intact. Oropharynx is clear with  moist mucosa.  Neck: Supple, nontender, good range of motion. No JVD or carotid bruits. Lungs: Clear to auscultation bilaterally. Cardiovascular: Regular rate and rhythm with normal S1 and S2. No murmurs. No gallops or rubs. Pedal pulses are palpable bilaterally. Homans test is negative bilaterally. No significant pretibial or ankle edema. Abdomen: Soft, nontender, and nondistended. Bowel sounds are present. Skin: No lesions in the area of chief complaint Neurologic: Awake, alert, and oriented. Sensory function is grossly intact. Motor strength is felt to be 5 over 5 bilaterally. No clonus or tremor. Good motor coordination. Lymphatic: No axillary or cervical lymphadenopathy  MUSCULOSKELETAL: Right lower extremity is significantly shortened and is in internal rotation.  She maintains intact flexion and extension of her toes.  She has palpable pulses in her dorsalis pedis and posterior tibial.  Leg apartments are soft.  Assessment: Right hip prosthetic dislocation  Plan: 83 years old female 5 days status post right total hip replacement through direct anterior approach.  She presents after a fall with right hip possible anterior dislocation.  Patient underwent close reduction under sedation.  This was achieved with longitudinal traction and internal rotation.  Postreduction films confirmed adequate position of the prosthetic head within the cup.  Patient was placed in a knee immobilizer.  Patient is instructed to follow-up with her initial operating surgeon next week.  She will be discharged home today.  James P. Holley Bouche M.D.

## 2019-04-23 NOTE — Discharge Instructions (Signed)
Keep the knee immobilizer on at all times until you see Dr. Rhona Raider.

## 2019-04-23 NOTE — ED Provider Notes (Signed)
[facility] Emergency Department Provider Note  ____________________________________________  Time seen: Approximately 10:52 AM  I have reviewed the triage vital signs and the nursing notes.   HISTORY  Chief Complaint Fall and Hip Pain    HPI Wendy Patton is a 83 y.o. female with a history of GERD who comes the ED complaining of sudden onset of right hip pain that started about 8 AM.  Constant, nonradiating, worse with any movement of the right leg, no alleviating factors, severe.  This occurred after she was leaning over and reaching to the floor and fell slightly.  She heard a crunching sound coming from her hip.  She recently had a right total hip arthroplasty 5 days ago by Dr. Rhona Raider at [facility]     Past Medical History:  Diagnosis Date  . Anemia    after jaw surgery was on iron for short period of time  . Arthritis   . Cancer (HCC)    squamous cell  . Difficult intubation    potential for difficult airway due to limited mouth opening  . Dry skin   . GERD (gastroesophageal reflux disease)    takes Aciphex daily  . Headache(784.0)    hx of migraines   . Heart murmur   . History of migraine    last one Aug 2014 and takes Imitrex daily as needed  . History of staph infection   . IBS (irritable bowel syndrome)   . Joint pain   . Joint swelling   . Laryngopharyngeal reflux   . MVP (mitral valve prolapse)   . Neuralgia   . Neuropathy    takes gabapentin daily  . PONV (postoperative nausea and vomiting)    TMJ surgery several times  . Rheumatic fever    as a child  . Shortness of breath    with exertion  . Thyroid disease      Patient Active Problem List   Diagnosis Date Noted  . Primary localized osteoarthritis of right hip 04/18/2019  . Primary osteoarthritis of right hip 04/18/2019  . Pectus excavatum 10/12/2017  . 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 Perimenopausal atrophic vaginitis 09/22/2016  . Menopausal hot  flushes 09/22/2016  . Mitral valve prolapse 01/13/2016  . Herniation of nucleus pulposus 05/01/2015  . Hypercholesteremia 05/01/2015  . Osteoarthritis of both knees 05/01/2015  . History of migraine headaches 05/01/2015  . Esophageal reflux 01/10/2015  . S/P total hip arthroplasty 09/12/2013     Past Surgical History:  Procedure Laterality Date  . ABDOMINAL HYSTERECTOMY  2009  . BREAST DUCTAL SYSTEM EXCISION Right 01/11/2014   Procedure: MAJOR DUCT EXCISION RIGHT BREAST;  Surgeon: Stark Klein, MD;  Location: [facility];  Service: General;  Laterality: Right;  . BREAST EXCISIONAL BIOPSY Right 2015   neg  . BREAST EXCISIONAL BIOPSY Left 2010   benign papilloma removed  . BREAST EXCISIONAL BIOPSY Left 1975   neg  . BREAST SURGERY  1975, 2010   benign breast tumor  . breat excision  1975   benign breast tumor excision  . CATARACT EXTRACTION W/PHACO Right 07/15/2016   Procedure: CATARACT EXTRACTION PHACO AND INTRAOCULAR LENS PLACEMENT (IOC);  Surgeon: Estill Cotta, MD;  Location: [facility] ORS;  Service: Ophthalmology;  Laterality: Right;  Lot # [id number] H [place]: 01:38.6 AP%: 25.0 CDE: 46.89  . CATARACT EXTRACTION W/PHACO Left 09/02/2016   Procedure: CATARACT EXTRACTION PHACO AND INTRAOCULAR LENS PLACEMENT (IOC);  Surgeon: Estill Cotta, MD;  Location: [facility] ORS;  Service: Ophthalmology;  Laterality: Left;  [place] 2:05.3 AP% 24.9 CDE 51.84 Fluid pack lot # [id number]  . cervial polyps  1999  . [place]  . COLONOSCOPY    . CYSTECTOMY  1962   vaginal cyst removal  . DILATION AND CURETTAGE OF UTERUS  1984  . ESOPHAGOGASTRODUODENOSCOPY    . granuloma  2002   chin granuloma removed  . [place], 2002, 2004   prosthesis, condyles implants, fossa replaced  . SIGMOIDOSCOPY  1993  . SQUAMOUS CELL CARCINOMA EXCISION  2008   left ankle/skin graft  . [place]  . THYROIDECTOMY  2006   left  . TONSILLECTOMY AND  ADENOIDECTOMY  1944  . TOTAL HIP ARTHROPLASTY Left 09/12/2013   Procedure: LEFT TOTAL HIP ARTHROPLASTY ANTERIOR APPROACH;  Surgeon: [name], MD;  Location: [place];  Service: Orthopedics;  Laterality: Left;  . TOTAL HIP ARTHROPLASTY Right 04/18/2019   Procedure: RIGHT TOTAL HIP ARTHROPLASTY ANTERIOR APPROACH;  Surgeon: Melrose Nakayama, MD;  Location: [facility];  Service: Orthopedics;  Laterality: Right;  . TRAPEZIUM RESECTION  2000, 2004   removal right & left hand trapezium  . TUBAL LIGATION    . uterine polyps  2008  . vaginal cyst removed  1962     Prior to Admission medications   Medication Sig Start Date End Date Taking? Authorizing Provider  acetaminophen (TYLENOL) 650 MG CR tablet Take 650 mg by mouth 2 (two) times daily as needed for pain.    [provider]  aspirin 81 MG chewable tablet Chew 1 tablet (81 mg total) by mouth 2 (two) times daily. 04/19/19   Loni Dolly, PA-C  Calcium Carb-Cholecalciferol (CALCIUM 600+D) 600-800 MG-UNIT TABS Take 1 tablet by mouth 2 (two) times daily.    [provider]  Cholecalciferol (VITAMIN D) 2000 units tablet Take 4,000 Units by mouth daily.     [provider]  cycloSPORINE (RESTASIS) 0.05 % ophthalmic emulsion Place 1 drop into both eyes 2 (two) times daily.     [provider]  estradiol (ESTRACE) 1 MG tablet Take 1 tablet (1 mg total) by mouth 4 (four) times a week. 02/23/19   Trinna Post, PA-C  Glucosamine HCl 1000 MG TABS Take 1,000 mg by mouth daily.    [provider]  Omega-3 1000 MG CAPS Take 1,000 mg by mouth daily.     [provider]  pantoprazole (PROTONIX) 40 MG tablet Take 1 tablet (40 mg total) by mouth 2 (two) times daily. 01/04/18   Carmon Ginsberg, PA  Polyethyl Glycol-Propyl Glycol (SYSTANE OP) Place 1 drop into both eyes daily as needed (dry eyes).    [provider]  traMADol (ULTRAM) 50 MG tablet Take 1 tablet (50 mg total) by mouth every 6 (six) hours as  needed for moderate pain or severe pain. 04/19/19   Loni Dolly, PA-C  vitamin B-12 (CYANOCOBALAMIN) 1000 MCG tablet Take 1,000 mcg by mouth daily.    [provider]  vitamin E 1000 UNIT capsule Take 1,000 Units by mouth daily.      [provider]     Allergies Avelox [moxifloxacin], Neurontin [gabapentin], Nitrofurantoin monohyd macro, Percocet [oxycodone-acetaminophen], Pulmicort turbuhaler [budesonide], Amoxicillin, Cefdinir, Chlorhexidine, Ciprofloxacin hcl, Clindamycin/lincomycin, Doxycycline, Entex, Hydrocodone, Other, Tavist-d [albertsons dayhist-d], Tizanidine, and Witch hazel   Family History  Problem Relation Age of Onset  . Heart attack Maternal Grandmother   . Breast cancer Neg Hx  Social History Social History   Tobacco Use  . Smoking status: Never Smoker  . Smokeless tobacco: Never Used  Substance Use Topics  . Alcohol use: No    Alcohol/week: 0.0 standard drinks  . Drug use: No    Review of Systems  Constitutional:   No fever or chills.  ENT:   No sore throat. No rhinorrhea. Cardiovascular:   No chest pain or syncope. Respiratory:   No dyspnea or cough. Gastrointestinal:   Negative for abdominal pain, vomiting and diarrhea.  Musculoskeletal:   Positive right hip pain All other systems reviewed and are negative except as documented above in ROS and HPI.  ____________________________________________   PHYSICAL EXAM:  VITAL SIGNS: ED Triage Vitals  Enc Vitals Group     BP 04/23/19 0920 (!) 146/65     Pulse Rate 04/23/19 0920 68     Resp 04/23/19 0920 18     Temp 04/23/19 0920 (!) 97.4 F (36.3 C)     Temp Source 04/23/19 0920 Oral     SpO2 04/23/19 0920 98 %     Weight 04/23/19 0923 136 lb (61.7 kg)     Height 04/23/19 0923 5\' 8"  (1.727 m)     Head Circumference --      Peak Flow --      Pain Score 04/23/19 0917 10     Pain Loc --      Pain Edu? --      Excl. in [place]? --     Vital signs reviewed, nursing assessments  reviewed.   Constitutional:   Alert and oriented. Non-toxic appearance. Eyes:   Conjunctivae are normal. EOMI. PERRL. ENT      Head:   Normocephalic and atraumatic.      Nose:   Wearing a mask.      Mouth/Throat:   Wearing a mask.      Neck:   No meningismus. Full ROM. Hematological/Lymphatic/Immunilogical:   No cervical lymphadenopathy. Cardiovascular:   RRR. Symmetric bilateral radial and DP pulses.  No murmurs. Cap refill less than 2 seconds. Respiratory:   Normal respiratory effort without tachypnea/retractions. Breath sounds are clear and equal bilaterally. No wheezes/rales/rhonchi. Gastrointestinal:   Soft and nontender. Non distended. There is no CVA tenderness.  No rebound, rigidity, or guarding.  Musculoskeletal:   Right lower extremity shortening compared to left.  Tenderness at proximal right femur.  Recent surgical incision is clean dry intact without inflammatory changes. Neurologic:   Normal speech and language.  Motor grossly intact. No acute focal neurologic deficits are appreciated.  Skin:    Skin is warm, dry and intact. No rash noted.  No petechiae, purpura, or bullae.  ____________________________________________    LABS (pertinent positives/negatives) (all labs ordered are listed, but only abnormal results are displayed) Labs Reviewed  BASIC METABOLIC PANEL - Abnormal; Notable for the following components:      Result Value   Sodium 130 (*)    Chloride 95 (*)    Glucose, Bld 169 (*)    All other components within normal limits  CBC WITH DIFFERENTIAL/PLATELET - Abnormal; Notable for the following components:   Hemoglobin 11.4 (*)    HCT 34.2 (*)    All other components within normal limits  PROTIME-INR   ____________________________________________   EKG    ____________________________________________    RADIOLOGY  Dg Chest 1 View  Result Date: 04/23/2019 CLINICAL DATA:  Fall, right hip pain EXAM: CHEST  1 VIEW COMPARISON:  04/13/2019  FINDINGS: Stable right upper lobe  nodule, chronic dating back to 2010, benign. Left lung is clear. No pleural effusion or pneumothorax. The heart is normal in size. IMPRESSION: No evidence of acute cardiopulmonary disease. Electronically Signed   By: Julian Hy M.D.   On: 04/23/2019 10:14   Dg Hip Unilat W Or Wo Pelvis 2-3 Views Right  Result Date: 04/23/2019 CLINICAL DATA:  Right hip pain after fall EXAM: DG HIP (WITH OR WITHOUT PELVIS) 2-3V RIGHT COMPARISON:  None. FINDINGS: Right hip arthroplasty. Superior/posterior dislocation of the femoral head component relative to the acetabular cup. Left hip arthroplasty, without evidence of complication. Visualized bony pelvis appears intact. Degenerative changes of the lower lumbar spine. IMPRESSION: Right hip arthroplasty with associated superior/posterior dislocation, as described above. Left hip arthroplasty, without evidence of complication. Electronically Signed   By: Julian Hy M.D.   On: 04/23/2019 10:13    ____________________________________________   PROCEDURES .Sedation  Date/Time: 04/23/2019 11:47 AM Performed by: Carrie Mew, MD Authorized by: Carrie Mew, MD   Consent:    Consent obtained:  Verbal and written   Consent given by:  Patient and spouse   Risks discussed:  Allergic reaction, dysrhythmia, inadequate sedation, nausea, prolonged hypoxia resulting in organ damage, prolonged sedation necessitating reversal, respiratory compromise necessitating ventilatory assistance and intubation and vomiting   Alternatives discussed:  Analgesia without sedation, anxiolysis and regional anesthesia Universal protocol:    Procedure explained and questions answered to patient or proxy's satisfaction: yes     Relevant documents present and verified: yes     Test results available and properly labeled: yes     Imaging studies available: yes     Required blood products, implants, devices, and special equipment available:  yes     Site/side marked: yes     Immediately prior to procedure a time out was called: yes     Patient identity confirmation method:  Verbally with patient and arm band Indications:    Procedure performed:  Dislocation reduction   Procedure necessitating sedation performed by:  Different physician (Ortho Dr. Donivan Scull) Pre-sedation assessment:    Time since last food or drink:  5 hours   NPO status caution: urgency dictates proceeding with non-ideal NPO status     ASA classification: class 1 - normal, healthy patient     Neck mobility: normal     Mouth opening:  3 or more finger widths   Thyromental distance:  4 finger widths   Mallampati score:  I - soft palate, uvula, fauces, pillars visible   Pre-sedation assessments completed and reviewed: airway patency, cardiovascular function, hydration status, mental status, nausea/vomiting, pain level, respiratory function and temperature     Pre-sedation assessment completed:  04/23/2019 11:00 AM Immediate pre-procedure details:    Reassessment: Patient reassessed immediately prior to procedure     Reviewed: vital signs, relevant labs/tests and NPO status     Verified: bag valve mask available, emergency equipment available, intubation equipment available, IV patency confirmed, oxygen available and suction available   Procedure details (see MAR for exact dosages):    Preoxygenation:  Nasal cannula   Sedation:  Propofol and midazolam   Analgesia:  Fentanyl   Intra-procedure monitoring:  Blood pressure monitoring, cardiac monitor, continuous pulse oximetry, frequent LOC assessments, frequent vital sign checks and continuous capnometry   Intra-procedure events: none     Total Provider sedation time (minutes):  25 Post-procedure details:    Post-sedation assessment completed:  04/23/2019 12:44 PM   Attendance: Constant attendance by certified staff until patient recovered  Recovery: Patient returned to pre-procedure baseline     Post-sedation  assessments completed and reviewed: airway patency, cardiovascular function, hydration status, mental status, nausea/vomiting, pain level and respiratory function     Patient is stable for discharge or admission: yes     Patient tolerance:  Tolerated well, no immediate complications    ____________________________________________  DIFFERENTIAL DIAGNOSIS   Periprosthetic hip fracture, dislocated hip prosthesis,  CLINICAL IMPRESSION / ASSESSMENT AND PLAN / ED COURSE  Medications ordered in the ED: Medications  fentaNYL (SUBLIMAZE) 100 MCG/2ML injection (  Not Given 04/23/19 0941)  fentaNYL (SUBLIMAZE) 100 MCG/2ML injection (has no administration in time range)  fentaNYL (SUBLIMAZE) injection 50 mcg (has no administration in time range)  propofol (DIPRIVAN) 10 mg/mL bolus/IV push 30.9 mg (has no administration in time range)  etomidate (AMIDATE) injection 18.52 mg (has no administration in time range)  midazolam (VERSED) injection 2 mg (has no administration in time range)  midazolam (VERSED) injection 4 mg (has no administration in time range)  fentaNYL (SUBLIMAZE) injection 50 mcg (50 mcg Intravenous Given 04/23/19 0920)  sodium chloride 0.9 % bolus 500 mL (500 mLs Intravenous New Bag/Given 04/23/19 0941)  fentaNYL (SUBLIMAZE) injection 50 mcg (50 mcg Intravenous Given 04/23/19 0937)  fentaNYL (SUBLIMAZE) injection 50 mcg (50 mcg Intravenous Given 04/23/19 1015)    Pertinent labs & imaging results that were available during my care of the patient were reviewed by me and considered in my medical decision making (see chart for details).  Wendy Patton was evaluated in Emergency Department on 04/23/2019 for the symptoms described in the history of present illness. She was evaluated in the context of the global COVID-19 pandemic, which necessitated consideration that the patient might be at risk for infection with the SARS-CoV-2 virus that causes COVID-19. Institutional protocols and  algorithms that pertain to the evaluation of patients at risk for COVID-19 are in a state of rapid change based on information released by regulatory bodies including the CDC and federal and state organizations. These policies and algorithms were followed during the patient's care in the ED.   Patient presents with severe pain of the right hip after a fall at home 5 days after right total hip arthroplasty.  Will obtain x-rays, fentanyl 50 mcg IV.  Clinical Course as of Apr 22 1146  Sun Apr 23, 2019  1050 X-ray shows posterior dislocation of the right prosthetic hip.  Discussed with orthopedics Dr. Donivan Scull who will come to assist with attempted reduction in the ED.  Patient counseled on possible failure of closed reduction under deep sedation.  They consent to the deep sedation and reduction attempt understanding that if we are unsuccessful in the ED she will need to be transferred to her orthopedic surgeon for operative management.   [PS]  [id number] Patient has received multiple doses of fentanyl, so severe pain.  I will give her a milligram of IV Versed while awaiting orthopedics for anxiolysis to hopefully improve symptom control.   [PS]  1147 Dislocation successfully reduced by Dr. Donivan Scull. Will continue monitoring until recovered from sedation.    [PS]    Clinical Course User Index [PS] Carrie Mew, MD      ----------------------------------------- 12:44 PM on 04/23/2019 -----------------------------------------  Back to baseline.  Reports no pain currently.  Has an appointment with her orthopedic surgeon in 5 days.  Will keep the knee immobilizer on at all times until then.  ____________________________________________   FINAL CLINICAL IMPRESSION(S) / ED DIAGNOSES    Final diagnoses:  Dislocation of internal right hip prosthesis, initial encounter [facility])  Right hip pain   ED Discharge Orders    None      Portions of this note were generated with dragon dictation software.  Dictation errors may occur despite best attempts at proofreading.   Carrie Mew, MD 04/23/19 1245

## 2019-04-23 NOTE — ED Triage Notes (Signed)
Pt arrived via EMS from home with reports of right hip pain after a fall this morning around 8 am.  Pt states she was reaching for something on the floor when she fell and caught herself. Pt states she heard a scrunching sound.  Pt took dose of tramadol around 8am.  Pt alert and oriented on arrival

## 2019-04-26 ENCOUNTER — Other Ambulatory Visit: Payer: Self-pay | Admitting: Physician Assistant

## 2019-04-26 DIAGNOSIS — N952 Postmenopausal atrophic vaginitis: Secondary | ICD-10-CM

## 2019-04-26 DIAGNOSIS — Z780 Asymptomatic menopausal state: Secondary | ICD-10-CM

## 2019-04-26 DIAGNOSIS — R350 Frequency of micturition: Secondary | ICD-10-CM

## 2019-04-26 MED ORDER — ESTRADIOL 1 MG PO TABS: 1.0000 mg | ORAL_TABLET | ORAL | 3 refills | Status: DC

## 2019-04-26 NOTE — Telephone Encounter (Signed)
[facility] faxed refill request for the following medications:  estradiol (ESTRACE) 1 MG tablet   Please advise.

## 2019-04-28 DIAGNOSIS — M1611 Unilateral primary osteoarthritis, right hip: Secondary | ICD-10-CM | POA: Diagnosis not present

## 2019-05-05 DIAGNOSIS — M1611 Unilateral primary osteoarthritis, right hip: Secondary | ICD-10-CM | POA: Diagnosis not present

## 2019-05-08 DIAGNOSIS — M3501 Sicca syndrome with keratoconjunctivitis: Secondary | ICD-10-CM | POA: Diagnosis not present

## 2019-05-16 ENCOUNTER — Encounter: Payer: Self-pay | Admitting: Physician Assistant

## 2019-05-31 ENCOUNTER — Telehealth: Payer: Self-pay | Admitting: Physician Assistant

## 2019-05-31 NOTE — Telephone Encounter (Signed)
Pt has some COVID questions.  She and her husband by have been exposed.  Please call pt back for advise at [phone].   Thanks, [employer]

## 2019-05-31 NOTE — Telephone Encounter (Signed)
Pt calling back a 2nd time due to possible COVID exposure.  Needing a call back today please.  Thanks, [employer]

## 2019-06-01 NOTE — Telephone Encounter (Signed)
Patient is calling back. She states she contacted the hospital and the nurse recommended a COVID test. The nurse advised her to contact [place] to see exactly when patient needs to be tested. Patient states she was exposed Sunday and Monday. CB# [phone]

## 2019-06-01 NOTE — Telephone Encounter (Signed)
Test Friday.

## 2019-06-01 NOTE — Telephone Encounter (Signed)
Patient was advised.  

## 2019-06-02 ENCOUNTER — Other Ambulatory Visit: Payer: Self-pay | Admitting: *Deleted

## 2019-06-02 DIAGNOSIS — Z20822 Contact with and (suspected) exposure to covid-19: Secondary | ICD-10-CM

## 2019-06-04 LAB — NOVEL CORONAVIRUS, NAA: SARS-CoV-2, NAA: NOT DETECTED

## 2019-06-10 DIAGNOSIS — Z96643 Presence of artificial hip joint, bilateral: Secondary | ICD-10-CM | POA: Diagnosis not present

## 2019-06-19 DIAGNOSIS — M6281 Muscle weakness (generalized): Secondary | ICD-10-CM | POA: Diagnosis not present

## 2019-06-19 DIAGNOSIS — Z96641 Presence of right artificial hip joint: Secondary | ICD-10-CM | POA: Diagnosis not present

## 2019-06-22 DIAGNOSIS — Z96641 Presence of right artificial hip joint: Secondary | ICD-10-CM | POA: Diagnosis not present

## 2019-06-22 DIAGNOSIS — M6281 Muscle weakness (generalized): Secondary | ICD-10-CM | POA: Diagnosis not present

## 2019-07-03 DIAGNOSIS — Z96641 Presence of right artificial hip joint: Secondary | ICD-10-CM | POA: Diagnosis not present

## 2019-07-03 DIAGNOSIS — M6281 Muscle weakness (generalized): Secondary | ICD-10-CM | POA: Diagnosis not present

## 2019-07-30 IMAGING — US ULTRASOUND RIGHT BREAST LIMITED
1 series · 14 of 17 positions shown · non-contrast
Comparison: Previous exam(s).

CLINICAL DATA: Patient was called back from screening mammogram for
a possible mass in the right breast.

EXAM:
ULTRASOUND OF THE RIGHT BREAST

[Series 1: ultrasound right breast limited · 0.07mm/px · 14 of 17 slices shown]
[im 1/17]
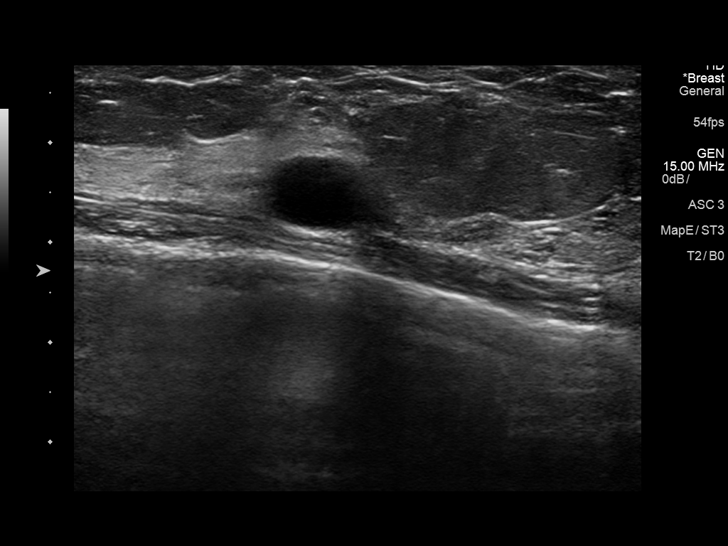
[im 2/17]
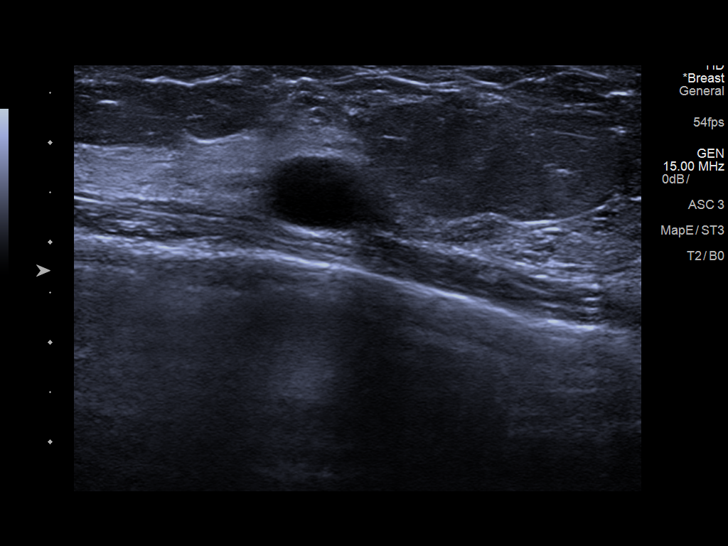
[im 4/17]
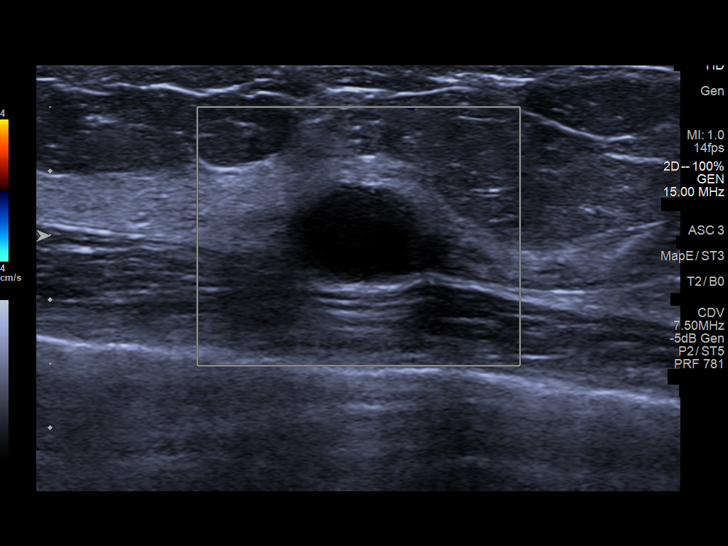
[im 5/17]
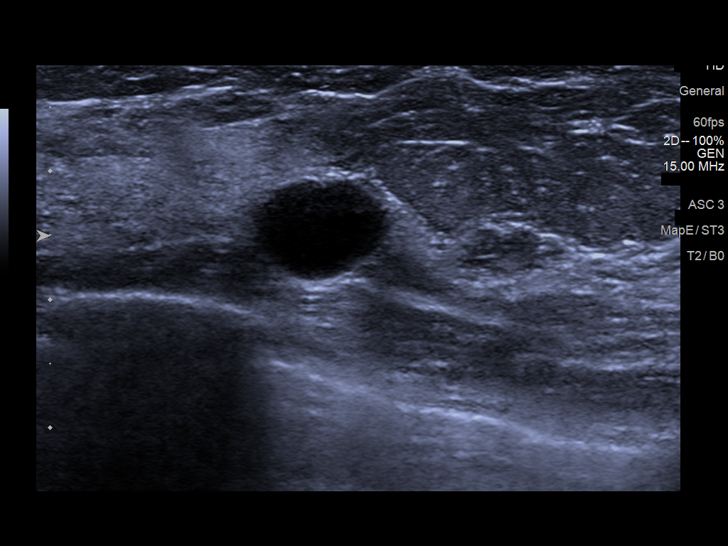
[im 6/17]
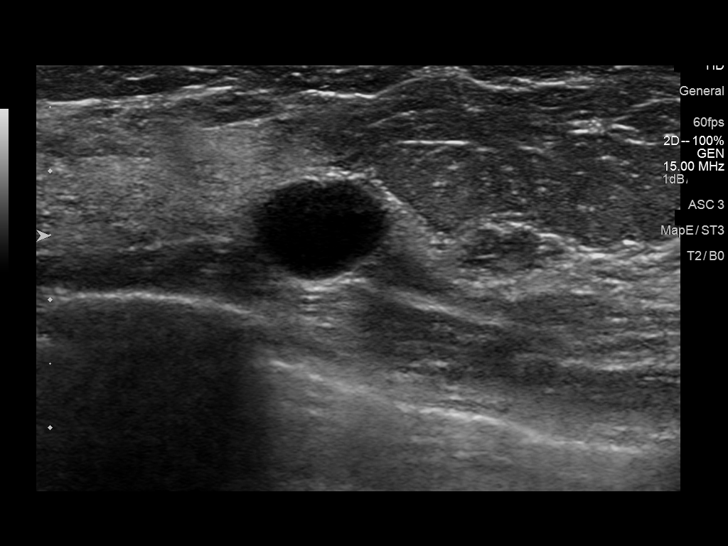
[im 7/17]
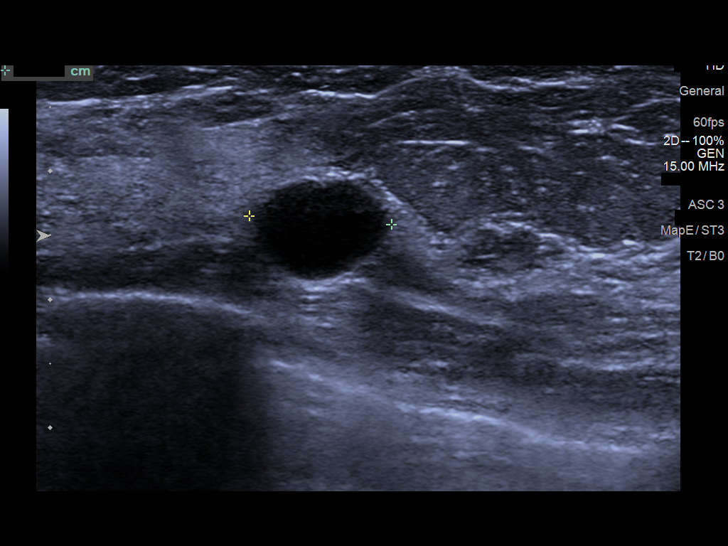
[im 8/17]
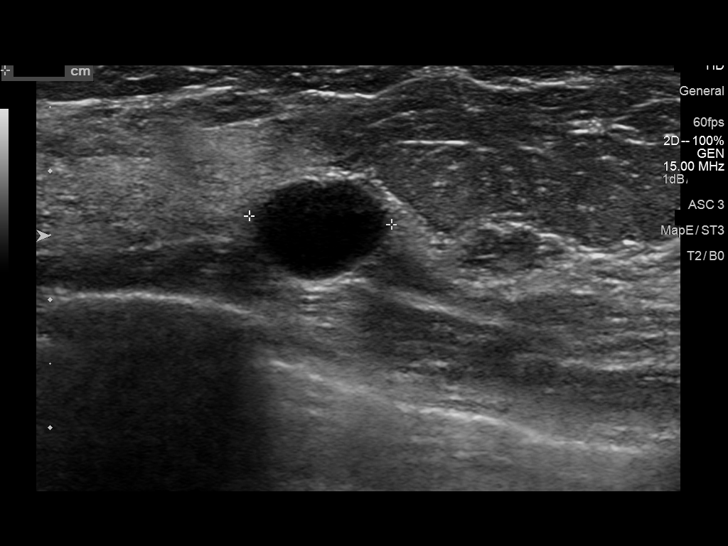
[im 10/17]
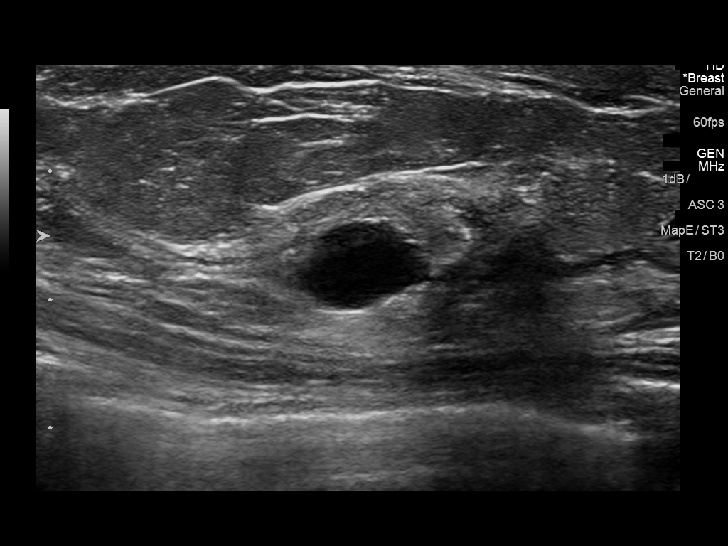
[im 11/17]
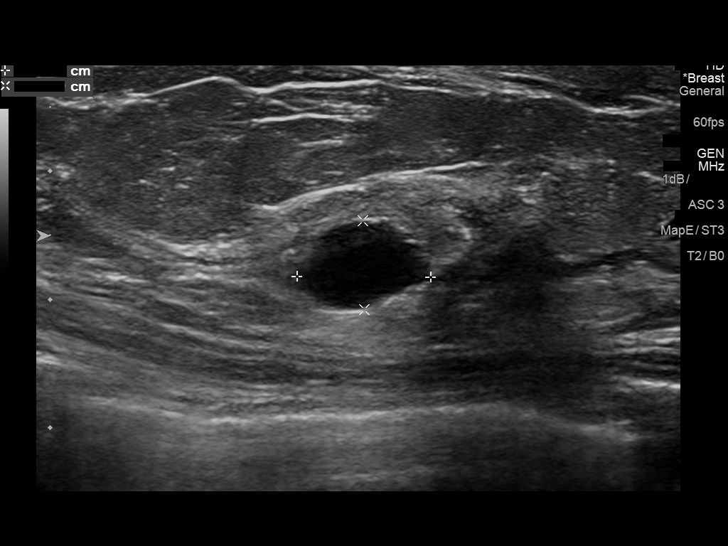
[im 12/17]
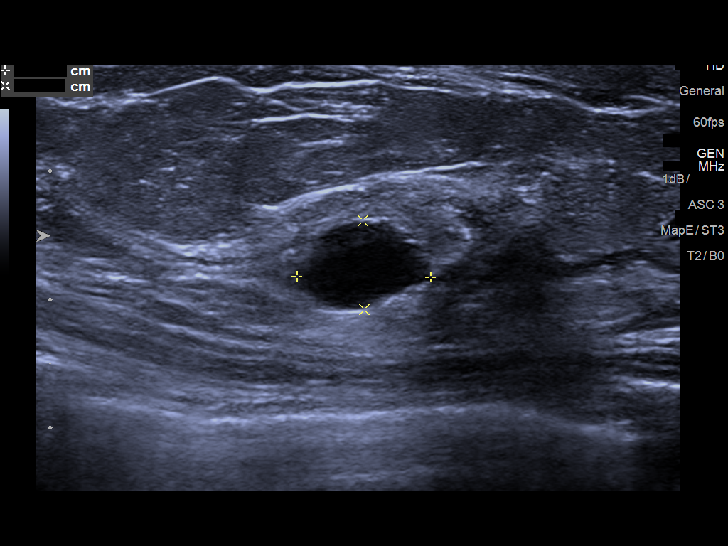
[im 13/17]
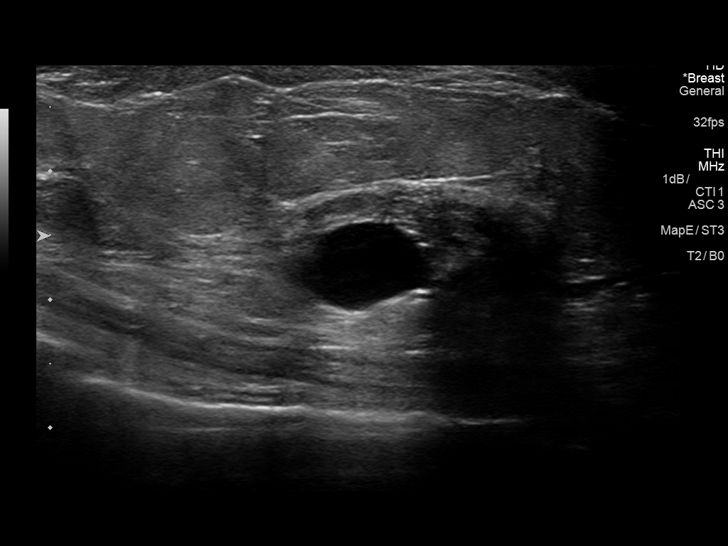
[im 14/17]
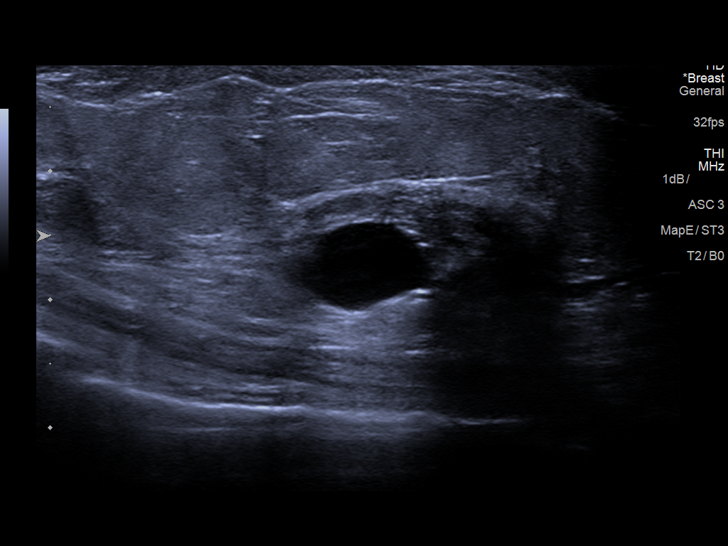
[im 16/17]
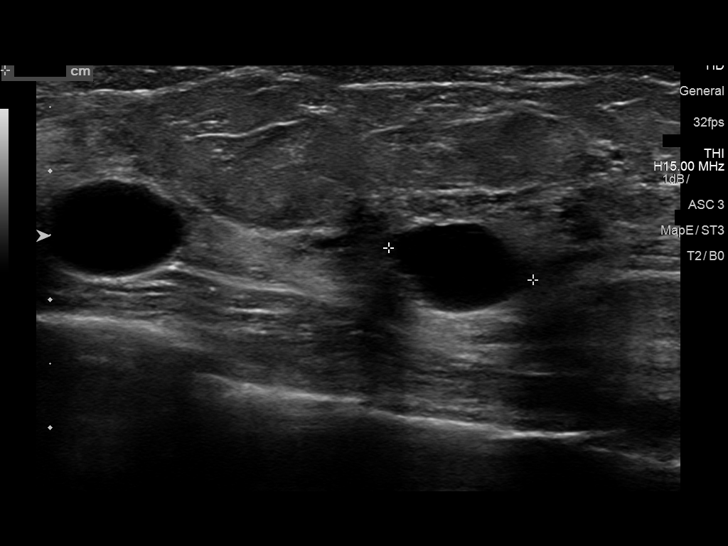
[im 17/17]
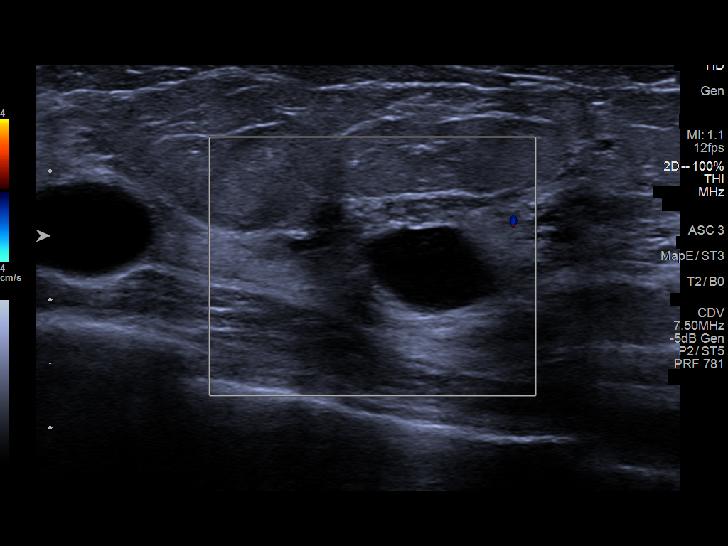

[14 of 17 positions shown; findings below may reference images not displayed]

FINDINGS: Targeted ultrasound is performed, showing an anechoic cyst in the
right breast at 10 o'clock 3 cm from the nipple measuring 1.1 x
x 0.8 cm. There is a second anechoic cyst in the right breast 11
o'clock 3 cm from the nipple measuring 1.0 x 0.7 x 1.2 cm. No solid
mass or abnormal shadowing detected.
IMPRESSION: Right breast cysts.  No evidence of malignancy.

RECOMMENDATION:
Bilateral screening mammogram in 1 year is recommended.

I have discussed the findings and recommendations with the patient.
Results were also provided in writing at the conclusion of the
visit. If applicable, a reminder letter will be sent to the patient
regarding the next appointment.

BI-RADS CATEGORY  2: Benign.

## 2019-08-01 DIAGNOSIS — L820 Inflamed seborrheic keratosis: Secondary | ICD-10-CM | POA: Diagnosis not present

## 2019-08-01 DIAGNOSIS — L538 Other specified erythematous conditions: Secondary | ICD-10-CM | POA: Diagnosis not present

## 2019-08-01 DIAGNOSIS — Z08 Encounter for follow-up examination after completed treatment for malignant neoplasm: Secondary | ICD-10-CM | POA: Diagnosis not present

## 2019-08-01 DIAGNOSIS — Z85828 Personal history of other malignant neoplasm of skin: Secondary | ICD-10-CM | POA: Diagnosis not present

## 2019-08-01 DIAGNOSIS — Q808 Other congenital ichthyosis: Secondary | ICD-10-CM | POA: Diagnosis not present

## 2019-08-01 DIAGNOSIS — L298 Other pruritus: Secondary | ICD-10-CM | POA: Diagnosis not present

## 2019-08-07 DIAGNOSIS — T84020S Dislocation of internal right hip prosthesis, sequela: Secondary | ICD-10-CM | POA: Diagnosis not present

## 2019-08-07 DIAGNOSIS — Z96641 Presence of right artificial hip joint: Secondary | ICD-10-CM | POA: Diagnosis not present

## 2019-08-09 DIAGNOSIS — H6123 Impacted cerumen, bilateral: Secondary | ICD-10-CM | POA: Diagnosis not present

## 2019-08-09 DIAGNOSIS — H903 Sensorineural hearing loss, bilateral: Secondary | ICD-10-CM | POA: Diagnosis not present

## 2019-08-11 ENCOUNTER — Other Ambulatory Visit
Admission: RE | Admit: 2019-08-11 | Discharge: 2019-08-11 | Disposition: A | Discharge status: Home / Self Care | Payer: PPO | Source: Ambulatory Visit | Attending: Orthopaedic Surgery | Admitting: Orthopaedic Surgery

## 2019-08-11 ENCOUNTER — Other Ambulatory Visit: Payer: Self-pay | Admitting: Orthopaedic Surgery

## 2019-08-11 ENCOUNTER — Telehealth: Payer: Self-pay | Admitting: Physician Assistant

## 2019-08-11 ENCOUNTER — Other Ambulatory Visit: Payer: Self-pay

## 2019-08-11 DIAGNOSIS — Z20822 Contact with and (suspected) exposure to covid-19: Secondary | ICD-10-CM | POA: Diagnosis not present

## 2019-08-11 DIAGNOSIS — Z01812 Encounter for preprocedural laboratory examination: Secondary | ICD-10-CM | POA: Insufficient documentation

## 2019-08-11 NOTE — Telephone Encounter (Signed)
Would not get the vaccine day before surgery.

## 2019-08-11 NOTE — Patient Instructions (Addendum)
DUE TO COVID-19 ONLY ONE VISITOR IS ALLOWED TO COME WITH YOU AND STAY IN THE WAITING ROOM ONLY DURING PRE OP AND PROCEDURE DAY OF SURGERY. THE 1 VISITOR MAY VISIT WITH YOU AFTER SURGERY IN YOUR PRIVATE ROOM DURING VISITING HOURS ONLY!   ONCE YOUR COVID TEST IS COMPLETED, PLEASE BEGIN THE QUARANTINE INSTRUCTIONS AS OUTLINED IN YOUR HANDOUT.                [facility]     Your procedure is scheduled on: Tuesday 08/15/2019   Report to [facility] Main  Entrance    Report to admitting at 1200  PM     Call this number if you have problems the morning of surgery [phone]    Remember: Do not eat food  :After Midnight.      NO SOLID FOOD AFTER MIDNIGHT THE NIGHT PRIOR TO SURGERY. NOTHING BY MOUTH EXCEPT CLEAR LIQUIDS UNTIL  1130 am  .     PLEASE FINISH ENSURE DRINK PER SURGEON ORDER  WHICH NEEDS TO BE COMPLETED AT  1130 am .   CLEAR LIQUID DIET   Foods Allowed                                                                     Foods Excluded  Coffee and tea, regular and decaf                             liquids that you cannot  Plain Jell-O any favor except red or purple                                           see through such as: Fruit ices (not with fruit pulp)                                     milk, soups, orange juice  Iced Popsicles                                    All solid food Carbonated beverages, regular and diet                                    Cranberry, grape and apple juices Sports drinks like Gatorade Lightly seasoned clear broth or consume(fat free) Sugar, honey syrup  Sample Menu Breakfast                                Lunch                                     Supper Cranberry juice  Beef broth                            Chicken broth Jell-O                                     Grape juice                           Apple juice Coffee or tea                        Jell-O                                      Popsicle                                                 Coffee or tea                        Coffee or tea  _____________________________________________________________________      BRUSH YOUR TEETH MORNING OF SURGERY AND RINSE YOUR MOUTH OUT, NO CHEWING GUM CANDY OR MINTS.     Take these medicines the morning of surgery with A SIP OF WATER: Pantoprazole (Protonix), use eye drops                                 You may not have any metal on your body including hair pins and              piercings  Do not wear jewelry, make-up, lotions, powders or perfumes, deodorant             Do not wear nail polish on your fingernails.  Do not shave  48 hours prior to surgery.                 Do not bring valuables to the [facility]. [facility].  Contacts, dentures or bridgework may not be worn into surgery.  Leave suitcase in the car. After surgery it may be brought to your room.     Patients discharged the day of surgery will not be allowed to drive home. IF YOU ARE HAVING SURGERY AND GOING HOME THE SAME DAY, YOU MUST HAVE AN ADULT TO DRIVE YOU HOME AND BE WITH YOU FOR 24 HOURS. YOU MAY GO HOME BY TAXI OR UBER OR ORTHERWISE, BUT AN ADULT MUST ACCOMPANY YOU HOME AND STAY WITH YOU FOR 24 HOURS.  Name and phone number of your driver: [facility]  [phone], son- Wendy [facility]ton  [phone]                Please read over the following fact sheets you were given: _____________________________________________________________________    Instructed patient to test using Safeguard soap and if she is Allergic to it, to bath the night before surgery and morning of surgery with her normal soap-Basis.(Patient is Allergic to Chlorhexidine soap  and pads and Dial soap)          [facility] - Preparing for Surgery Before surgery, you can play an important role.  Because skin is not sterile, your skin needs to be as free of germs as possible.  You can reduce the number of germs on your skin  by washing with CHG (chlorahexidine gluconate) soap before surgery.  CHG is an antiseptic cleaner which kills germs and bonds with the skin to continue killing germs even after washing. Please DO NOT use if you have an allergy to CHG or antibacterial soaps.  If your skin becomes reddened/irritated stop using the CHG and inform your nurse when you arrive at Short Stay. Do not shave (including legs and underarms) for at least 48 hours prior to the first CHG shower.  You may shave your face/neck. Please follow these instructions carefully:  1.  Shower with CHG Soap the night before surgery and the  morning of Surgery.  2.  If you choose to wash your hair, wash your hair first as usual with your  normal  shampoo.  3.  After you shampoo, rinse your hair and body thoroughly to remove the  shampoo.                           4.  Use CHG as you would any other liquid soap.  You can apply chg directly  to the skin and wash                       Gently with a scrungie or clean washcloth.  5.  Apply the CHG Soap to your body ONLY FROM THE NECK DOWN.   Do not use on face/ open                           Wound or open sores. Avoid contact with eyes, ears mouth and genitals (private parts).                       Wash face,  Genitals (private parts) with your normal soap.             6.  Wash thoroughly, paying special attention to the area where your surgery  will be performed.  7.  Thoroughly rinse your body with warm water from the neck down.  8.  DO NOT shower/wash with your normal soap after using and rinsing off  the CHG Soap.                9.  Pat yourself dry with a clean towel.            10.  Wear clean pajamas.            11.  Place clean sheets on your bed the night of your first shower and do not  sleep with pets. Day of Surgery : Do not apply any lotions/deodorants the morning of surgery.  Please wear clean clothes to the [facility]/surgery center.  FAILURE TO FOLLOW THESE INSTRUCTIONS MAY RESULT IN  THE CANCELLATION OF YOUR SURGERY PATIENT SIGNATURE_________________________________  NURSE SIGNATURE__________________________________  ________________________________________________________________________   [facility]  An incentive spirometer is a tool that can help keep your lungs clear and active. This tool measures how well you are filling your lungs with each breath. Taking long deep breaths may help reverse or decrease the  chance of developing breathing (pulmonary) problems (especially infection) following:  A long period of time when you are unable to move or be active. BEFORE THE PROCEDURE   If the spirometer includes an indicator to show your best effort, your nurse or respiratory therapist will set it to a desired goal.  If possible, sit up straight or lean slightly forward. Try not to slouch.  Hold the incentive spirometer in an upright position. INSTRUCTIONS FOR USE  1. Sit on the edge of your bed if possible, or sit up as far as you can in bed or on a chair. 2. Hold the incentive spirometer in an upright position. 3. Breathe out normally. 4. Place the mouthpiece in your mouth and seal your lips tightly around it. 5. Breathe in slowly and as deeply as possible, raising the piston or the ball toward the top of the column. 6. Hold your breath for 3-5 seconds or for as long as possible. Allow the piston or ball to fall to the bottom of the column. 7. Remove the mouthpiece from your mouth and breathe out normally. 8. Rest for a few seconds and repeat Steps 1 through 7 at least 10 times every 1-2 hours when you are awake. Take your time and take a few normal breaths between deep breaths. 9. The spirometer may include an indicator to show your best effort. Use the indicator as a goal to work toward during each repetition. 10. After each set of 10 deep breaths, practice coughing to be sure your lungs are clear. If you have an incision (the cut made at the time of  surgery), support your incision when coughing by placing a pillow or rolled up towels firmly against it. Once you are able to get out of bed, walk around indoors and cough well. You may stop using the incentive spirometer when instructed by your caregiver.  RISKS AND COMPLICATIONS  Take your time so you do not get dizzy or light-headed.  If you are in pain, you may need to take or ask for pain medication before doing incentive spirometry. It is harder to take a deep breath if you are having pain. AFTER USE  Rest and breathe slowly and easily.  It can be helpful to keep track of a log of your progress. Your caregiver can provide you with a simple table to help with this. If you are using the spirometer at home, follow these instructions: [place] IF:   You are having difficultly using the spirometer.  You have trouble using the spirometer as often as instructed.  Your pain medication is not giving enough relief while using the spirometer.  You develop fever of 100.5 F (38.1 C) or higher. SEEK IMMEDIATE MEDICAL CARE IF:   You cough up bloody sputum that had not been present before.  You develop fever of 102 F (38.9 C) or greater.  You develop worsening pain at or near the incision site. MAKE SURE YOU:   Understand these instructions.  Will watch your condition.  Will get help right away if you are not doing well or get worse. Document Released: 11/30/2006 Document Revised: 10/12/2011 Document Reviewed: 01/31/2007 ExitCare Patient Information 2014 ExitCare, [place].   ________________________________________________________________________  WHAT IS A BLOOD TRANSFUSION? Blood Transfusion Information  A transfusion is the replacement of blood or some of its parts. Blood is made up of multiple cells which provide different functions.  Red blood cells carry oxygen and are used for blood loss replacement.  White blood  cells fight against infection.  Platelets  control bleeding.  Plasma helps clot blood.  Other blood products are available for specialized needs, such as hemophilia or other clotting disorders. BEFORE THE TRANSFUSION  Who gives blood for transfusions?   Healthy volunteers who are fully evaluated to make sure their blood is safe. This is blood bank blood. Transfusion therapy is the safest it has ever been in the practice of medicine. Before blood is taken from a donor, a complete history is taken to make sure that person has no history of diseases nor engages in risky social behavior (examples are intravenous drug use or sexual activity with multiple partners). The donor's travel history is screened to minimize risk of transmitting infections, such as malaria. The donated blood is tested for signs of infectious diseases, such as HIV and hepatitis. The blood is then tested to be sure it is compatible with you in order to minimize the chance of a transfusion reaction. If you or a relative donates blood, this is often done in anticipation of surgery and is not appropriate for emergency situations. It takes many days to process the donated blood. RISKS AND COMPLICATIONS Although transfusion therapy is very safe and saves many lives, the main dangers of transfusion include:   Getting an infectious disease.  Developing a transfusion reaction. This is an allergic reaction to something in the blood you were given. Every precaution is taken to prevent this. The decision to have a blood transfusion has been considered carefully by your caregiver before blood is given. Blood is not given unless the benefits outweigh the risks. AFTER THE TRANSFUSION  Right after receiving a blood transfusion, you will usually feel much better and more energetic. This is especially true if your red blood cells have gotten low (anemic). The transfusion raises the level of the red blood cells which carry oxygen, and this usually causes an energy increase.  The nurse  administering the transfusion will monitor you carefully for complications. HOME CARE INSTRUCTIONS  No special instructions are needed after a transfusion. You may find your energy is better. Speak with your caregiver about any limitations on activity for underlying diseases you may have. SEEK MEDICAL CARE IF:   Your condition is not improving after your transfusion.  You develop redness or irritation at the intravenous (IV) site. SEEK IMMEDIATE MEDICAL CARE IF:  Any of the following symptoms occur over the next 12 hours:  Shaking chills.  You have a temperature by mouth above 102 F (38.9 C), not controlled by medicine.  Chest, back, or muscle pain.  People around you feel you are not acting correctly or are confused.  Shortness of breath or difficulty breathing.  Dizziness and fainting.  You get a rash or develop hives.  You have a decrease in urine output.  Your urine turns a dark color or changes to pink, red, or brown. Any of the following symptoms occur over the next 10 days:  You have a temperature by mouth above 102 F (38.9 C), not controlled by medicine.  Shortness of breath.  Weakness after normal activity.  The white part of the eye turns yellow (jaundice).  You have a decrease in the amount of urine or are urinating less often.  Your urine turns a dark color or changes to pink, red, or brown. Document Released: 07/17/2000 Document Revised: 10/12/2011 Document Reviewed: 03/05/2008 [facility] Patient Information 2014 [place], [place].  _______________________________________________________________________

## 2019-08-11 NOTE — Telephone Encounter (Signed)
Patient was advised.  

## 2019-08-11 NOTE — Telephone Encounter (Signed)
Patient is calling for advice should she have COVID vaccine the day before elective surgery. Patient is hoping to schedule vaccine today or Monday 1/11. Her surgery is scheduled 08/15/19. Please advise [id number]

## 2019-08-11 NOTE — Progress Notes (Signed)
Please place orders in Epic for pre-op on Monday 08/14/2019! Thank you!

## 2019-08-11 NOTE — Telephone Encounter (Signed)
Due to possible side effect of the vaccine do you recommend she get it prior to surgery.  I would worry if she developed fever you don't know if from the vaccine or surgery??

## 2019-08-12 LAB — SARS CORONAVIRUS 2 (TAT 6-24 HRS): SARS Coronavirus 2: NEGATIVE

## 2019-08-14 ENCOUNTER — Encounter (HOSPITAL_COMMUNITY)
Admission: RE | Admit: 2019-08-14 | Discharge: 2019-08-14 | Disposition: A | Discharge status: Home / Self Care | Payer: PPO | Source: Ambulatory Visit | Attending: Orthopaedic Surgery | Admitting: Orthopaedic Surgery

## 2019-08-14 ENCOUNTER — Other Ambulatory Visit: Payer: Self-pay

## 2019-08-14 ENCOUNTER — Other Ambulatory Visit (HOSPITAL_COMMUNITY): Payer: Self-pay | Admitting: *Deleted

## 2019-08-14 ENCOUNTER — Encounter (HOSPITAL_COMMUNITY): Payer: Self-pay

## 2019-08-14 ENCOUNTER — Other Ambulatory Visit: Payer: Self-pay | Admitting: Orthopaedic Surgery

## 2019-08-14 DIAGNOSIS — M25551 Pain in right hip: Secondary | ICD-10-CM | POA: Diagnosis present

## 2019-08-14 DIAGNOSIS — R011 Cardiac murmur, unspecified: Secondary | ICD-10-CM | POA: Insufficient documentation

## 2019-08-14 DIAGNOSIS — Z79899 Other long term (current) drug therapy: Secondary | ICD-10-CM | POA: Diagnosis not present

## 2019-08-14 DIAGNOSIS — Z96641 Presence of right artificial hip joint: Secondary | ICD-10-CM | POA: Diagnosis not present

## 2019-08-14 DIAGNOSIS — T84020A Dislocation of internal right hip prosthesis, initial encounter: Secondary | ICD-10-CM | POA: Diagnosis present

## 2019-08-14 DIAGNOSIS — K589 Irritable bowel syndrome without diarrhea: Secondary | ICD-10-CM | POA: Insufficient documentation

## 2019-08-14 DIAGNOSIS — T884XXA Failed or difficult intubation, initial encounter: Secondary | ICD-10-CM | POA: Insufficient documentation

## 2019-08-14 DIAGNOSIS — D649 Anemia, unspecified: Secondary | ICD-10-CM | POA: Diagnosis present

## 2019-08-14 DIAGNOSIS — E7800 Pure hypercholesterolemia, unspecified: Secondary | ICD-10-CM | POA: Diagnosis present

## 2019-08-14 DIAGNOSIS — Z885 Allergy status to narcotic agent status: Secondary | ICD-10-CM | POA: Diagnosis not present

## 2019-08-14 DIAGNOSIS — Z471 Aftercare following joint replacement surgery: Secondary | ICD-10-CM | POA: Diagnosis not present

## 2019-08-14 DIAGNOSIS — I252 Old myocardial infarction: Secondary | ICD-10-CM | POA: Diagnosis not present

## 2019-08-14 DIAGNOSIS — Z01812 Encounter for preprocedural laboratory examination: Secondary | ICD-10-CM | POA: Insufficient documentation

## 2019-08-14 DIAGNOSIS — Z7982 Long term (current) use of aspirin: Secondary | ICD-10-CM | POA: Diagnosis not present

## 2019-08-14 DIAGNOSIS — Z881 Allergy status to other antibiotic agents status: Secondary | ICD-10-CM | POA: Diagnosis not present

## 2019-08-14 DIAGNOSIS — Z888 Allergy status to other drugs, medicaments and biological substances status: Secondary | ICD-10-CM | POA: Diagnosis not present

## 2019-08-14 DIAGNOSIS — Z8249 Family history of ischemic heart disease and other diseases of the circulatory system: Secondary | ICD-10-CM | POA: Diagnosis not present

## 2019-08-14 DIAGNOSIS — Z20822 Contact with and (suspected) exposure to covid-19: Secondary | ICD-10-CM | POA: Diagnosis present

## 2019-08-14 DIAGNOSIS — T8484XA Pain due to internal orthopedic prosthetic devices, implants and grafts, initial encounter: Secondary | ICD-10-CM | POA: Diagnosis present

## 2019-08-14 DIAGNOSIS — I4510 Unspecified right bundle-branch block: Secondary | ICD-10-CM | POA: Diagnosis present

## 2019-08-14 DIAGNOSIS — I341 Nonrheumatic mitral (valve) prolapse: Secondary | ICD-10-CM | POA: Diagnosis present

## 2019-08-14 DIAGNOSIS — Q676 Pectus excavatum: Secondary | ICD-10-CM | POA: Diagnosis not present

## 2019-08-14 DIAGNOSIS — I445 Left posterior fascicular block: Secondary | ICD-10-CM | POA: Diagnosis present

## 2019-08-14 DIAGNOSIS — E039 Hypothyroidism, unspecified: Secondary | ICD-10-CM | POA: Insufficient documentation

## 2019-08-14 DIAGNOSIS — G43909 Migraine, unspecified, not intractable, without status migrainosus: Secondary | ICD-10-CM | POA: Diagnosis present

## 2019-08-14 DIAGNOSIS — M170 Bilateral primary osteoarthritis of knee: Secondary | ICD-10-CM | POA: Diagnosis present

## 2019-08-14 DIAGNOSIS — J449 Chronic obstructive pulmonary disease, unspecified: Secondary | ICD-10-CM | POA: Insufficient documentation

## 2019-08-14 DIAGNOSIS — K219 Gastro-esophageal reflux disease without esophagitis: Secondary | ICD-10-CM | POA: Diagnosis present

## 2019-08-14 LAB — CBC WITH DIFFERENTIAL/PLATELET
Abs Immature Granulocytes: 0.02 10*3/uL (ref 0.00–0.07)
Basophils Absolute: 0 10*3/uL (ref 0.0–0.1)
Basophils Relative: 1 %
Eosinophils Absolute: 0.1 10*3/uL (ref 0.0–0.5)
Eosinophils Relative: 1 %
HCT: 41.9 % (ref 36.0–46.0)
Hemoglobin: 13.3 g/dL (ref 12.0–15.0)
Immature Granulocytes: 0 %
Lymphocytes Relative: 22 %
Lymphs Abs: 1.8 10*3/uL (ref 0.7–4.0)
MCH: 27.1 pg (ref 26.0–34.0)
MCHC: 31.7 g/dL (ref 30.0–36.0)
MCV: 85.5 fL (ref 80.0–100.0)
Monocytes Absolute: 0.5 10*3/uL (ref 0.1–1.0)
Monocytes Relative: 6 %
Neutro Abs: 5.7 10*3/uL (ref 1.7–7.7)
Neutrophils Relative %: 70 %
Platelets: 284 10*3/uL (ref 150–400)
RBC: 4.9 MIL/uL (ref 3.87–5.11)
RDW: 13.6 % (ref 11.5–15.5)
WBC: 8.1 10*3/uL (ref 4.0–10.5)
nRBC: 0 % (ref 0.0–0.2)

## 2019-08-14 LAB — URINALYSIS, ROUTINE W REFLEX MICROSCOPIC
Bacteria, UA: NONE SEEN
Bilirubin Urine: NEGATIVE
Glucose, UA: NEGATIVE mg/dL
Ketones, ur: NEGATIVE mg/dL
Nitrite: NEGATIVE
Protein, ur: NEGATIVE mg/dL
Specific Gravity, Urine: 1.009 (ref 1.005–1.030)
pH: 6 (ref 5.0–8.0)

## 2019-08-14 LAB — BASIC METABOLIC PANEL
Anion gap: 9 (ref 5–15)
BUN: 20 mg/dL (ref 8–23)
CO2: 27 mmol/L (ref 22–32)
Calcium: 9.7 mg/dL (ref 8.9–10.3)
Chloride: 98 mmol/L (ref 98–111)
Creatinine, Ser: 0.97 mg/dL (ref 0.44–1.00)
GFR calc Af Amer: >60 mL/min (ref >60)
GFR calc non Af Amer: 55 mL/min — ABNORMAL LOW (ref >60)
Glucose, Bld: 94 mg/dL (ref 70–99)
Potassium: 4.1 mmol/L (ref 3.5–5.1)
Sodium: 134 mmol/L — ABNORMAL LOW (ref 135–145)

## 2019-08-14 LAB — PROTIME-INR
INR: 1 (ref 0.8–1.2)
Prothrombin Time: 13.5 seconds (ref 11.4–15.2)

## 2019-08-14 LAB — SURGICAL PCR SCREEN
MRSA, PCR: NEGATIVE
Staphylococcus aureus: NEGATIVE

## 2019-08-14 LAB — APTT: aPTT: 28 seconds (ref 24–36)

## 2019-08-14 NOTE — Progress Notes (Signed)
Anesthesia Chart Review   Case: [id number] Date/Time: 08/15/19 1415   Procedure: TOTAL HIP ARTHROPLASTY ANTERIOR APPROACH (Right Hip)   Anesthesia type: Spinal   Pre-op diagnosis: PAINFUL RIGHT TOTAL HIP REPLACEMENT   [facility] RO[facility] 06 [facility]   Surgeons: Melrose Nakayama, MD      DISCUSSION:84 y.o. never smoker with h/o PONV, difficult intubation, GERD, MVP, COPD, painful right total hip replacement scheduled for above procedure 08/15/19 with Dr. Melrose Nakayama.   H/o mandible fracture surgery with prosthetic condyles implanted, fossa replaced.  Per anesthesia review by Myra Gianotti, PA-C 09/01/13, "She is a potential for DIFFICULT INTUBATIONbecause she has very limited mouth opening (approximately 1 1/2 finger breadths). She may have had an awake intubation in the past--she's note sure. She also has issues with laryngopharyngeal reflux.  On 08/16/07 she had a hysterectomy at [facility]. Rapid sequence oral intubation was performed with successful intubation with a 6.0 ETT after two attempts using a Miller 3 blade, stylet, and tooth guard. Staff at [facility] state that she was not intubated for her colonoscopy there."  Pt scheduled for spinal anesthesia.   Prior to right total hip arthroplasty 04/18/2019  seen by cardiology 04/13/2019.  Seen by Daune Perch, NP.  Per OV note, "No prior history of CAD of CHF.  Patient had normal LV systolic function on echocardiogram in 10/2017.  She does have grade 2 diastolic dysfunction.  Based on health history and the revised cardiac risk index, this patient is low risk for surgery.  Her activity level is good.  No chest discomfort or shortness of breath.  Based on ACC/AHA guidelines, [facility] would be at acceptable risk for the planned procedure without further cardiovascular testing." Stable since this time.   Anticipate pt can proceed with planned procedure barring acute status change and after evaluation DOS.   VS: There were no vitals taken for  this visit.  PROVIDERS: Trinna Post, PA-C is PCP   Ida Rogue, MD is Cardiologist  LABS: labs pending (all labs ordered are listed, but only abnormal results are displayed)  Labs Reviewed - No data to display   IMAGES: Chest Xray 04/13/2019 FINDINGS: Cardiomediastinal silhouette is normal. Mediastinal contours appear intact.  There is no evidence of focal airspace consolidation, pleural effusion or pneumothorax. Stable soft tissue nodule in the left upper lobe, stable from 2015 and therefore likely benign.  Osseous structures are without acute abnormality. Soft tissues are grossly normal.  IMPRESSION: No active cardiopulmonary disease.  EKG: 04/14/2019 Rate 78 bpm Sinus rhythm with premature atrial complexes Right bundle branch block  Left posterior fascicular block  Bifascicular block  Inferior infarct, age undetermined   CV: Echo 10/28/2017 Study Conclusions  - Left ventricle: The cavity size was normal. There was mild concentric hypertrophy. Systolic function was normal. The estimated ejection fraction was in the range of 60% to 65%. Wall motion was normal; there were no regional wall motion abnormalities. Features are consistent with a pseudonormal left ventricular filling pattern, with concomitant abnormal relaxation and increased filling pressure (grade 2 diastolic dysfunction). - Left atrium: The atrium was mildly dilated. - Right ventricle: Systolic function was normal. - Pulmonary arteries: Systolic pressure was mildly elevated. PA peak pressure: 38 mm Hg (S). - Pericardium, extracardiac: A trivial pericardial effusion was identified. Past Medical History:  Diagnosis Date  . Anemia    after jaw surgery was on iron for short period of time  . Arthritis   . Cancer (HCC)    squamous  cell  . Difficult intubation    potential for difficult airway due to limited mouth opening  . Dry skin   . GERD (gastroesophageal reflux  disease)    takes Aciphex daily  . Headache(784.0)    hx of migraines   . Heart murmur   . History of migraine    last one Aug 2014 and takes Imitrex daily as needed  . History of staph infection   . IBS (irritable bowel syndrome)   . Joint pain   . Joint swelling   . Laryngopharyngeal reflux   . MVP (mitral valve prolapse)   . Neuralgia   . Neuropathy    takes gabapentin daily  . PONV (postoperative nausea and vomiting)    TMJ surgery several times  . Rheumatic fever    as a child  . Shortness of breath    with exertion  . Thyroid disease     Past Surgical History:  Procedure Laterality Date  . ABDOMINAL HYSTERECTOMY  2009  . BREAST DUCTAL SYSTEM EXCISION Right 01/11/2014   Procedure: MAJOR DUCT EXCISION RIGHT BREAST;  Surgeon: Stark Klein, MD;  [facility];  Service: General;  Laterality: Right;  . BREAST EXCISIONAL BIOPSY Right 2015   neg  . BREAST EXCISIONAL BIOPSY Left 2010   benign papilloma removed  . BREAST EXCISIONAL BIOPSY Left 1975   neg  . BREAST SURGERY  1975, 2010   benign breast tumor  . breat excision  1975   benign breast tumor excision  . CATARACT EXTRACTION W/PHACO Right 07/15/2016   Procedure: CATARACT EXTRACTION PHACO AND INTRAOCULAR LENS PLACEMENT (IOC);  Surgeon: Estill Cotta, MD;  Location: [facility] ORS;  Service: Ophthalmology;  Laterality: Right;  Lot # [id number] H [place]: 01:38.6 AP%: 25.0 CDE: 46.89  . CATARACT EXTRACTION W/PHACO Left 09/02/2016   Procedure: CATARACT EXTRACTION PHACO AND INTRAOCULAR LENS PLACEMENT (IOC);  Surgeon: Estill Cotta, MD;  Location: [facility] ORS;  Service: Ophthalmology;  Laterality: Left;  [place] 2:05.3 AP% 24.9 CDE 51.84 Fluid pack lot # [id number]  . cervial polyps  1999  . [place]  . COLONOSCOPY    . CYSTECTOMY  1962   vaginal cyst removal  . DILATION AND CURETTAGE OF UTERUS  1984  . ESOPHAGOGASTRODUODENOSCOPY    . granuloma  2002   chin granuloma removed  . [place], 2002, 2004   prosthesis, condyles implants, fossa replaced  . SIGMOIDOSCOPY  1993  . SQUAMOUS CELL CARCINOMA EXCISION  2008   left ankle/skin graft  . [place]  . THYROIDECTOMY  2006   left  . TONSILLECTOMY AND ADENOIDECTOMY  1944  . TOTAL HIP ARTHROPLASTY Left 09/12/2013   Procedure: LEFT TOTAL HIP ARTHROPLASTY ANTERIOR APPROACH;  Surgeon: [name], MD;  Location: [facility];  Service: Orthopedics;  Laterality: Left;  . TOTAL HIP ARTHROPLASTY Right 04/18/2019   Procedure: RIGHT TOTAL HIP ARTHROPLASTY ANTERIOR APPROACH;  Surgeon: Melrose Nakayama, MD;  [facility];  Service: Orthopedics;  Laterality: Right;  . TRAPEZIUM RESECTION  2000, 2004   removal right & left hand trapezium  . TUBAL LIGATION    . uterine polyps  2008  . vaginal cyst removed  1962    MEDICATIONS: . acetaminophen (TYLENOL) 650 MG CR tablet  . aspirin 81 MG chewable tablet  . Calcium Carb-Cholecalciferol (CALCIUM 600+D) 600-800 MG-UNIT TABS  . Cholecalciferol (VITAMIN D) 2000 units tablet  . cycloSPORINE (RESTASIS) 0.05 % ophthalmic emulsion  .  estradiol (ESTRACE) 1 MG tablet  . Glucosamine HCl 1000 MG TABS  . Omega-3 1000 MG CAPS  . pantoprazole (PROTONIX) 40 MG tablet  . Polyethyl Glycol-Propyl Glycol (SYSTANE OP)  . traMADol (ULTRAM) 50 MG tablet  . vitamin B-12 (CYANOCOBALAMIN) 1000 MCG tablet  . vitamin E 1000 UNIT capsule   No current facility-administered medications for this encounter.    Maia Plan [facility] Pre-Surgical Testing [phone] 08/14/19  2:47 PM

## 2019-08-14 NOTE — Progress Notes (Signed)
PCP - Carles Collet, PA  [facility] Cardiologist - Dr. Ida Rogue  For pre-op in September for SOB and was diagnosed with COPD. Patient states was not told she needed a cardiac clearance for this surgery. Vascular Surgeon- Dr. Sherren Mocha Early  Chest x-ray -04/23/2019 1 view  EPIC 04/13/2019- CXR 2 view  EPIC  EKG - 04/14/2019  EPIC Stress Test - 25-30 years ago  Turned out to be GERD ECHO - 10/28/2017  EPIC Cardiac Cath - n/a  Sleep Study - n/a CPAP -n/a   Fasting Blood Sugar - n/a Checks Blood Sugar __0___ times a day  Blood Thinner Instructions:n/a Aspirin Instructions:n/a Last Dose:n/a  Anesthesia review:   Chart given to Konrad Felix, PA to review patient's medical history as a difficult intubation.  Patient has a history of arthritis, Difficult intubation (limited mouth opening from jaw surgeries), Rheumatic fever as a child, MVP,Heart murmur, and neuropathy.  Patient denies shortness of breath, fever, cough and chest pain at PAT appointment   Patient verbalized understanding of instructions that were given to them at the PAT appointment. Patient was also instructed that they will need to review over the PAT instructions again at home before surgery.

## 2019-08-14 NOTE — H&P (Signed)
TOTAL HIP REVISION ADMISSION H&P  Patient is admitted for right revision total hip arthroplasty.  Subjective:  Chief Complaint: right hip pain  HPI: Wendy Patton, 84 y.o. female, has a history of pain and functional disability in the right hip due to arthritis and patient has failed non-surgical conservative treatments for greater than 12 weeks to include NSAID's and/or analgesics, flexibility and strengthening excercises, supervised PT with diminished ADL's post treatment, use of assistive devices, weight reduction as appropriate and activity modification. The indications for the revision total hip arthroplasty are instability with history of dislocation.  Onset of symptoms was gradual starting 3 months years ago with gradually worsening course since that time.  Prior procedures on the right hip include arthroplasty.  Patient currently rates pain in the right hip at 10 out of 10 with activity.  There is pain and instability with range of motion testing.. Patient has evidence of a well-seated well aligned THR prosthesis with equal leg lengths by imaging studies. There is no current active infection.  Patient Active Problem List   Diagnosis Date Noted  . Primary localized osteoarthritis of right hip 04/18/2019  . Primary osteoarthritis of right hip 04/18/2019  . Pectus excavatum 10/12/2017  . 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 Perimenopausal atrophic vaginitis 09/22/2016  . Menopausal hot flushes 09/22/2016  . Mitral valve prolapse 01/13/2016  . Herniation of nucleus pulposus 05/01/2015  . Hypercholesteremia 05/01/2015  . Osteoarthritis of both knees 05/01/2015  . History of migraine headaches 05/01/2015  . Esophageal reflux 01/10/2015  . S/P total hip arthroplasty 09/12/2013   Past Medical History:  Diagnosis Date  . Anemia    after jaw surgery was on iron for short period of time  . Arthritis   . Cancer (HCC)    squamous cell  . Difficult intubation    potential for difficult  airway due to limited mouth opening  . Dry skin   . GERD (gastroesophageal reflux disease)    takes Aciphex daily  . Headache(784.0)    hx of migraines   . Heart murmur   . History of migraine    last one Aug 2014 and takes Imitrex daily as needed  . History of staph infection   . IBS (irritable bowel syndrome)   . Joint pain   . Joint swelling   . Laryngopharyngeal reflux   . MVP (mitral valve prolapse)   . Neuralgia   . Neuropathy    takes gabapentin daily  . PONV (postoperative nausea and vomiting)    TMJ surgery several times  . Rheumatic fever    as a child  . Shortness of breath    with exertion  . Thyroid disease     Past Surgical History:  Procedure Laterality Date  . ABDOMINAL HYSTERECTOMY  2009  . BREAST DUCTAL SYSTEM EXCISION Right 01/11/2014   Procedure: MAJOR DUCT EXCISION RIGHT BREAST;  Surgeon: Stark Klein, MD;  Location: [facility];  Service: General;  Laterality: Right;  . BREAST EXCISIONAL BIOPSY Right 2015   neg  . BREAST EXCISIONAL BIOPSY Left 2010   benign papilloma removed  . BREAST EXCISIONAL BIOPSY Left 1975   neg  . BREAST SURGERY  1975, 2010   benign breast tumor  . breat excision  1975   benign breast tumor excision  . CATARACT EXTRACTION W/PHACO Right 07/15/2016   Procedure: CATARACT EXTRACTION PHACO AND INTRAOCULAR LENS PLACEMENT (IOC);  Surgeon: Estill Cotta, MD;  Location: [facility];  Service: Ophthalmology;  Laterality: Right;  Lot #  [id number] H [place]: 01:38.6 AP%: 25.0 CDE: 46.89  . CATARACT EXTRACTION W/PHACO Left 09/02/2016   Procedure: CATARACT EXTRACTION PHACO AND INTRAOCULAR LENS PLACEMENT (IOC);  Surgeon: Estill Cotta, MD;  Location: [facility];  Service: Ophthalmology;  Laterality: Left;  [place] 2:05.3 AP% 24.9 CDE 51.84 Fluid pack lot # [id number]  . cervial polyps  1999  . [place]  . COLONOSCOPY    . CYSTECTOMY  1962   vaginal cyst removal  . DILATION AND CURETTAGE OF UTERUS  1984  . ESOPHAGOGASTRODUODENOSCOPY     . granuloma  2002   chin granuloma removed  . [place], 2002, 2004   prosthesis, condyles implants, fossa replaced  . SIGMOIDOSCOPY  1993  . SQUAMOUS CELL CARCINOMA EXCISION  2008   left ankle/skin graft  . [place]  . THYROIDECTOMY  2006   left  . TONSILLECTOMY AND ADENOIDECTOMY  1944  . TOTAL HIP ARTHROPLASTY Left 09/12/2013   Procedure: LEFT TOTAL HIP ARTHROPLASTY ANTERIOR APPROACH;  Surgeon: [name], MD;  Location: [facility];  Service: Orthopedics;  Laterality: Left;  . TOTAL HIP ARTHROPLASTY Right 04/18/2019   Procedure: RIGHT TOTAL HIP ARTHROPLASTY ANTERIOR APPROACH;  Surgeon: Melrose Nakayama, MD;  Location: [facility];  Service: Orthopedics;  Laterality: Right;  . TRAPEZIUM RESECTION  2000, 2004   removal right & left hand trapezium  . TUBAL LIGATION    . uterine polyps  2008  . vaginal cyst removed  1962    No current facility-administered medications for this encounter.   Current Outpatient Medications  Medication Sig Dispense Refill Last Dose  . acetaminophen (TYLENOL) 650 MG CR tablet Take 650 mg by mouth 2 (two) times daily as needed for pain.     Marland Kitchen aspirin 81 MG chewable tablet Chew 1 tablet (81 mg total) by mouth 2 (two) times daily. 60 tablet 0   . Calcium Carb-Cholecalciferol (CALCIUM 600+D) 600-800 MG-UNIT TABS Take 1 tablet by mouth 2 (two) times daily.     . Cholecalciferol (VITAMIN D) 2000 units tablet Take 4,000 Units by mouth daily.      . cycloSPORINE (RESTASIS) 0.05 % ophthalmic emulsion Place 1 drop into both eyes 2 (two) times daily.      Marland Kitchen estradiol (ESTRACE) 1 MG tablet Take 1 tablet (1 mg total) by mouth 4 (four) times a week. 48 tablet 3   . Glucosamine HCl 1000 MG TABS Take 1,000 mg by mouth daily.     . Omega-3 1000 MG CAPS Take 1,000 mg by mouth daily.      . pantoprazole (PROTONIX) 40 MG tablet Take 1 tablet (40 mg total) by mouth 2 (two) times daily. 60 tablet 1   . Polyethyl  Glycol-Propyl Glycol (SYSTANE OP) Place 1 drop into both eyes daily as needed (dry eyes).     . traMADol (ULTRAM) 50 MG tablet Take 1 tablet (50 mg total) by mouth every 6 (six) hours as needed for moderate pain or severe pain. 30 tablet 0   . vitamin B-12 (CYANOCOBALAMIN) 1000 MCG tablet Take 1,000 mcg by mouth daily.     . vitamin E 1000 UNIT capsule Take 1,000 Units by mouth daily.        Allergies  Allergen Reactions  . Avelox [Moxifloxacin] Other (See Comments)    C.diff colitis   . Neurontin [Gabapentin] Diarrhea    abd cramping   . Nitrofurantoin Monohyd Macro Hives  . Percocet [Oxycodone-Acetaminophen] Hives  .  Pulmicort Turbuhaler [Budesonide] Other (See Comments)    Blisters in mouth   . Amoxicillin Rash    Did it involve swelling of the face/tongue/throat, SOB, or low BP? No Did it involve sudden or severe rash/hives, skin peeling, or any reaction on the inside of your mouth or nose? No Did you need to seek medical attention at a hospital or doctor's office? Unknown When did it last happen?unknown If all above answers are "NO", may proceed with cephalosporin use.   . Cefdinir Rash  . Chlorhexidine Rash  . Ciprofloxacin Hcl Rash  . Clindamycin/Lincomycin Rash  . Doxycycline Rash  . Entex Hives and Rash  . Hydrocodone Rash  . Other Rash    Dial soap   . Tavist-D [Albertsons Dayhist-D] Rash  . Tizanidine Rash  . Witch Hazel Rash    Social History   Tobacco Use  . Smoking status: Never Smoker  . Smokeless tobacco: Never Used  Substance Use Topics  . Alcohol use: No    Alcohol/week: 0.0 standard drinks    Family History  Problem Relation Age of Onset  . Heart attack Maternal Grandmother   . Breast cancer Neg Hx       Review of Systems  Musculoskeletal:       Right hip  All other systems reviewed and are negative.   Objective:  Physical Exam  Constitutional: She is oriented to person, place, and time. She appears well-developed and  well-nourished.  HENT:  Head: Normocephalic and atraumatic.  Eyes: Pupils are equal, round, and reactive to light.  Cardiovascular: Normal rate and regular rhythm.  Respiratory: Effort normal.  GI: Soft.  Musculoskeletal:     Cervical back: Normal range of motion.     Comments: Right hip motion remains good and her leg lengths look equal.  She walks with a mildly altered gait.  She has apprehension with flexion and internal rotation of her hip but no instability.    Neurological: She is alert and oriented to person, place, and time.  Skin: Skin is warm and dry.    Vital signs in last 24 hours: Weight:  [61.2 kg] 61.2 kg (01/11 1314)   Labs:   Estimated body mass index is 19.94 kg/m as calculated from the following:   Height as of 08/14/19: 5\' 9"  (1.753 m).   Weight as of 08/14/19: 61.2 kg.  Imaging Review:  Plain radiographs demonstrate a well-seated well aligned THR prosthesis.  Leg lengths are roughly equal. The bone quality appears to be good for age and reported activity level.    Assessment/Plan:  End stage arthritis, right hip(s) with failed previous arthroplasty.  The patient history, physical examination, clinical judgement of the provider and imaging studies are consistent with end stage degenerative joint disease of the right hip(s), previous total hip arthroplasty. Revision total hip arthroplasty is deemed medically necessary. The treatment options including medical management, injection therapy, arthroscopy and arthroplasty were discussed at length. The risks and benefits of total hip arthroplasty were presented and reviewed. The risks due to aseptic loosening, infection, stiffness, dislocation/subluxation,  thromboembolic complications and other imponderables were discussed.  The patient acknowledged the explanation, agreed to proceed with the plan and consent was signed. Patient is being admitted for inpatient treatment for surgery, pain control, PT, OT, prophylactic  antibiotics, VTE prophylaxis, progressive ambulation and ADL's and discharge planning. The patient is planning to be discharged home with home health services

## 2019-08-15 ENCOUNTER — Encounter (HOSPITAL_COMMUNITY): Payer: Self-pay | Admitting: Orthopaedic Surgery

## 2019-08-15 ENCOUNTER — Inpatient Hospital Stay (HOSPITAL_COMMUNITY): Payer: PPO

## 2019-08-15 ENCOUNTER — Inpatient Hospital Stay (HOSPITAL_COMMUNITY): Payer: PPO | Admitting: Physician Assistant

## 2019-08-15 ENCOUNTER — Inpatient Hospital Stay (HOSPITAL_COMMUNITY): Payer: PPO | Admitting: Anesthesiology

## 2019-08-15 ENCOUNTER — Inpatient Hospital Stay (HOSPITAL_COMMUNITY)
Admission: RE | Admit: 2019-08-15 | Discharge: 2019-08-15 | DRG: 468 | Disposition: A | Discharge status: Home / Self Care | Payer: PPO | Attending: Orthopaedic Surgery | Admitting: Orthopaedic Surgery

## 2019-08-15 ENCOUNTER — Telehealth (HOSPITAL_COMMUNITY): Payer: Self-pay | Admitting: *Deleted

## 2019-08-15 ENCOUNTER — Encounter (HOSPITAL_COMMUNITY)
Admission: RE | Disposition: A | Discharge status: Home / Self Care | Payer: Self-pay | Source: Home / Self Care | Attending: Orthopaedic Surgery

## 2019-08-15 DIAGNOSIS — E7800 Pure hypercholesterolemia, unspecified: Secondary | ICD-10-CM | POA: Diagnosis present

## 2019-08-15 DIAGNOSIS — Z20822 Contact with and (suspected) exposure to covid-19: Secondary | ICD-10-CM | POA: Diagnosis present

## 2019-08-15 DIAGNOSIS — M25551 Pain in right hip: Secondary | ICD-10-CM | POA: Diagnosis present

## 2019-08-15 DIAGNOSIS — Z8249 Family history of ischemic heart disease and other diseases of the circulatory system: Secondary | ICD-10-CM

## 2019-08-15 DIAGNOSIS — Z888 Allergy status to other drugs, medicaments and biological substances status: Secondary | ICD-10-CM

## 2019-08-15 DIAGNOSIS — Z885 Allergy status to narcotic agent status: Secondary | ICD-10-CM | POA: Diagnosis not present

## 2019-08-15 DIAGNOSIS — Q676 Pectus excavatum: Secondary | ICD-10-CM | POA: Diagnosis not present

## 2019-08-15 DIAGNOSIS — I252 Old myocardial infarction: Secondary | ICD-10-CM | POA: Diagnosis not present

## 2019-08-15 DIAGNOSIS — G43909 Migraine, unspecified, not intractable, without status migrainosus: Secondary | ICD-10-CM | POA: Diagnosis present

## 2019-08-15 DIAGNOSIS — Z471 Aftercare following joint replacement surgery: Secondary | ICD-10-CM | POA: Diagnosis not present

## 2019-08-15 DIAGNOSIS — I341 Nonrheumatic mitral (valve) prolapse: Secondary | ICD-10-CM | POA: Diagnosis present

## 2019-08-15 DIAGNOSIS — Z7982 Long term (current) use of aspirin: Secondary | ICD-10-CM | POA: Diagnosis not present

## 2019-08-15 DIAGNOSIS — Z881 Allergy status to other antibiotic agents status: Secondary | ICD-10-CM

## 2019-08-15 DIAGNOSIS — Z79899 Other long term (current) drug therapy: Secondary | ICD-10-CM | POA: Diagnosis not present

## 2019-08-15 DIAGNOSIS — Z96641 Presence of right artificial hip joint: Secondary | ICD-10-CM | POA: Diagnosis present

## 2019-08-15 DIAGNOSIS — M170 Bilateral primary osteoarthritis of knee: Secondary | ICD-10-CM | POA: Diagnosis present

## 2019-08-15 DIAGNOSIS — I4510 Unspecified right bundle-branch block: Secondary | ICD-10-CM | POA: Diagnosis present

## 2019-08-15 DIAGNOSIS — T84020A Dislocation of internal right hip prosthesis, initial encounter: Principal | ICD-10-CM | POA: Diagnosis present

## 2019-08-15 DIAGNOSIS — D649 Anemia, unspecified: Secondary | ICD-10-CM | POA: Diagnosis present

## 2019-08-15 DIAGNOSIS — Z419 Encounter for procedure for purposes other than remedying health state, unspecified: Secondary | ICD-10-CM

## 2019-08-15 DIAGNOSIS — K219 Gastro-esophageal reflux disease without esophagitis: Secondary | ICD-10-CM | POA: Diagnosis present

## 2019-08-15 DIAGNOSIS — T8484XA Pain due to internal orthopedic prosthetic devices, implants and grafts, initial encounter: Secondary | ICD-10-CM | POA: Diagnosis present

## 2019-08-15 DIAGNOSIS — I445 Left posterior fascicular block: Secondary | ICD-10-CM | POA: Diagnosis present

## 2019-08-15 HISTORY — PX: TOTAL HIP ARTHROPLASTY: SHX124

## 2019-08-15 LAB — TYPE AND SCREEN
ABO/RH(D): A NEG
Antibody Screen: NEGATIVE

## 2019-08-15 SURGERY — ARTHROPLASTY, HIP, TOTAL, ANTERIOR APPROACH
Anesthesia: Monitor Anesthesia Care | Site: Hip | Laterality: Right

## 2019-08-15 MED ORDER — ASPIRIN 81 MG PO CHEW: 81.0000 mg | CHEWABLE_TABLET | Freq: Two times a day (BID) | ORAL | 0 refills | Status: DC

## 2019-08-15 MED ORDER — BUPIVACAINE HCL (PF) 0.25 % IJ SOLN
INTRAMUSCULAR | Status: AC
  Filled 2019-08-15: qty 30

## 2019-08-15 MED ORDER — EPHEDRINE SULFATE-NACL 50-0.9 MG/10ML-% IV SOSY
PREFILLED_SYRINGE | INTRAVENOUS | Status: DC | PRN
  Administered 2019-08-15: 5 mg via INTRAVENOUS

## 2019-08-15 MED ORDER — METOCLOPRAMIDE HCL 5 MG/ML IJ SOLN: 5.0000 mg | Freq: Three times a day (TID) | INTRAMUSCULAR | Status: DC | PRN

## 2019-08-15 MED ORDER — TRAMADOL HCL 50 MG PO TABS: 50.0000 mg | ORAL_TABLET | Freq: Four times a day (QID) | ORAL | 0 refills | Status: DC | PRN

## 2019-08-15 MED ORDER — PROPOFOL 10 MG/ML IV BOLUS
INTRAVENOUS | Status: DC | PRN
  Administered 2019-08-15: 20 mg via INTRAVENOUS

## 2019-08-15 MED ORDER — TRANEXAMIC ACID-NACL 1000-0.7 MG/100ML-% IV SOLN
1000.0000 mg | INTRAVENOUS | Status: AC
  Administered 2019-08-15: 1000 mg via INTRAVENOUS
  Filled 2019-08-15: qty 100

## 2019-08-15 MED ORDER — TRANEXAMIC ACID-NACL 1000-0.7 MG/100ML-% IV SOLN: 1000.0000 mg | Freq: Once | INTRAVENOUS | Status: DC

## 2019-08-15 MED ORDER — LACTATED RINGERS IV SOLN: INTRAVENOUS | Status: DC

## 2019-08-15 MED ORDER — POVIDONE-IODINE 10 % EX SWAB
2.0000 "application " | Freq: Once | CUTANEOUS | Status: AC
  Administered 2019-08-15: 2 via TOPICAL

## 2019-08-15 MED ORDER — BUPIVACAINE LIPOSOME 1.3 % IJ SUSP
10.0000 mL | Freq: Once | INTRAMUSCULAR | Status: DC
  Filled 2019-08-15: qty 10

## 2019-08-15 MED ORDER — PHENYLEPHRINE HCL-NACL 10-0.9 MG/250ML-% IV SOLN
INTRAVENOUS | Status: DC | PRN
  Administered 2019-08-15: 15 ug/min via INTRAVENOUS

## 2019-08-15 MED ORDER — ONDANSETRON HCL 4 MG PO TABS
4.0000 mg | ORAL_TABLET | Freq: Four times a day (QID) | ORAL | Status: DC | PRN
  Filled 2019-08-15: qty 1

## 2019-08-15 MED ORDER — LACTATED RINGERS IV BOLUS
500.0000 mL | Freq: Once | INTRAVENOUS | Status: AC
  Administered 2019-08-15: 500 mL via INTRAVENOUS

## 2019-08-15 MED ORDER — CHLORHEXIDINE GLUCONATE 4 % EX LIQD: 60.0000 mL | Freq: Once | CUTANEOUS | Status: DC

## 2019-08-15 MED ORDER — 0.9 % SODIUM CHLORIDE (POUR BTL) OPTIME
TOPICAL | Status: DC | PRN
  Administered 2019-08-15: 14:00:00 1000 mL

## 2019-08-15 MED ORDER — PHENYLEPHRINE 40 MCG/ML (10ML) SYRINGE FOR IV PUSH (FOR BLOOD PRESSURE SUPPORT)
PREFILLED_SYRINGE | INTRAVENOUS | Status: AC
  Filled 2019-08-15: qty 10

## 2019-08-15 MED ORDER — PROPOFOL 500 MG/50ML IV EMUL
INTRAVENOUS | Status: AC
  Filled 2019-08-15: qty 50

## 2019-08-15 MED ORDER — STERILE WATER FOR IRRIGATION IR SOLN
Status: DC | PRN
  Administered 2019-08-15 (×2): 1000 mL

## 2019-08-15 MED ORDER — PROPOFOL 500 MG/50ML IV EMUL
INTRAVENOUS | Status: DC | PRN
  Administered 2019-08-15: 30 ug/kg/min via INTRAVENOUS

## 2019-08-15 MED ORDER — METHOCARBAMOL 500 MG PO TABS: 500.0000 mg | ORAL_TABLET | Freq: Four times a day (QID) | ORAL | Status: DC | PRN

## 2019-08-15 MED ORDER — LIDOCAINE 2% (20 MG/ML) 5 ML SYRINGE
INTRAMUSCULAR | Status: DC | PRN
  Administered 2019-08-15: 40 mg via INTRAVENOUS

## 2019-08-15 MED ORDER — DEXAMETHASONE SODIUM PHOSPHATE 10 MG/ML IJ SOLN
INTRAMUSCULAR | Status: DC | PRN
  Administered 2019-08-15: 8 mg via INTRAVENOUS

## 2019-08-15 MED ORDER — PHENYLEPHRINE HCL (PRESSORS) 10 MG/ML IV SOLN
INTRAVENOUS | Status: AC
  Filled 2019-08-15: qty 1

## 2019-08-15 MED ORDER — PROPOFOL 10 MG/ML IV BOLUS
INTRAVENOUS | Status: AC
  Filled 2019-08-15: qty 20

## 2019-08-15 MED ORDER — LIDOCAINE 2% (20 MG/ML) 5 ML SYRINGE
INTRAMUSCULAR | Status: AC
  Filled 2019-08-15: qty 5

## 2019-08-15 MED ORDER — ONDANSETRON HCL 4 MG/2ML IJ SOLN: 4.0000 mg | Freq: Four times a day (QID) | INTRAMUSCULAR | Status: DC | PRN

## 2019-08-15 MED ORDER — BUPIVACAINE LIPOSOME 1.3 % IJ SUSP
INTRAMUSCULAR | Status: DC | PRN
  Administered 2019-08-15: 10 mL

## 2019-08-15 MED ORDER — METHOCARBAMOL 500 MG IVPB - SIMPLE MED: 500.0000 mg | Freq: Four times a day (QID) | INTRAVENOUS | Status: DC | PRN

## 2019-08-15 MED ORDER — MORPHINE SULFATE (PF) 4 MG/ML IV SOLN: 0.5000 mg | INTRAVENOUS | Status: DC | PRN

## 2019-08-15 MED ORDER — BUPIVACAINE IN DEXTROSE 0.75-8.25 % IT SOLN
INTRATHECAL | Status: DC | PRN
  Administered 2019-08-15: 1.8 mg via INTRATHECAL

## 2019-08-15 MED ORDER — METOCLOPRAMIDE HCL 5 MG PO TABS
5.0000 mg | ORAL_TABLET | Freq: Three times a day (TID) | ORAL | Status: DC | PRN
  Filled 2019-08-15: qty 2

## 2019-08-15 MED ORDER — TRANEXAMIC ACID 1000 MG/10ML IV SOLN
INTRAVENOUS | Status: DC | PRN
  Administered 2019-08-15: 14:00:00 2000 mg via TOPICAL

## 2019-08-15 MED ORDER — ONDANSETRON HCL 4 MG/2ML IJ SOLN
INTRAMUSCULAR | Status: DC | PRN
  Administered 2019-08-15: 4 mg via INTRAVENOUS

## 2019-08-15 MED ORDER — ONDANSETRON HCL 4 MG/2ML IJ SOLN
INTRAMUSCULAR | Status: AC
  Filled 2019-08-15: qty 2

## 2019-08-15 MED ORDER — DEXAMETHASONE SODIUM PHOSPHATE 10 MG/ML IJ SOLN
INTRAMUSCULAR | Status: AC
  Filled 2019-08-15: qty 1

## 2019-08-15 MED ORDER — LACTATED RINGERS IV BOLUS: 250.0000 mL | Freq: Once | INTRAVENOUS | Status: DC

## 2019-08-15 MED ORDER — BUPIVACAINE HCL (PF) 0.25 % IJ SOLN
INTRAMUSCULAR | Status: DC | PRN
  Administered 2019-08-15: 30 mL

## 2019-08-15 MED ORDER — VANCOMYCIN HCL IN DEXTROSE 1-5 GM/200ML-% IV SOLN
1000.0000 mg | INTRAVENOUS | Status: AC
  Administered 2019-08-15: 1000 mg via INTRAVENOUS
  Filled 2019-08-15: qty 200

## 2019-08-15 MED ORDER — EPHEDRINE 5 MG/ML INJ
INTRAVENOUS | Status: AC
  Filled 2019-08-15: qty 10

## 2019-08-15 MED ORDER — TRANEXAMIC ACID 1000 MG/10ML IV SOLN
2000.0000 mg | INTRAVENOUS | Status: DC
  Filled 2019-08-15: qty 20

## 2019-08-15 SURGICAL SUPPLY — 40 items
BAG DECANTER FOR FLEXI CONT (MISCELLANEOUS) ×2 IMPLANT
BLADE SAW SGTL 18X1.27X75 (BLADE) ×2 IMPLANT
BOOTIES KNEE HIGH SLOAN (MISCELLANEOUS) ×2 IMPLANT
CELLS DAT CNTRL 66122 CELL SVR (MISCELLANEOUS) ×1 IMPLANT
COVER PERINEAL POST (MISCELLANEOUS) ×2 IMPLANT
COVER SURGICAL LIGHT HANDLE (MISCELLANEOUS) ×2 IMPLANT
COVER WAND RF STERILE (DRAPES) IMPLANT
DECANTER SPIKE VIAL GLASS SM (MISCELLANEOUS) ×2 IMPLANT
DRAPE IMP U-DRAPE 54X76 (DRAPES) ×2 IMPLANT
DRAPE STERI IOBAN 125X83 (DRAPES) ×2 IMPLANT
DRAPE U-SHAPE 47X51 STRL (DRAPES) ×4 IMPLANT
DRESSING AQUACEL AG SP 3.5X6 (GAUZE/BANDAGES/DRESSINGS) ×1 IMPLANT
DRSG AQUACEL AG ADV 3.5X 6 (GAUZE/BANDAGES/DRESSINGS) ×2 IMPLANT
DRSG AQUACEL AG SP 3.5X6 (GAUZE/BANDAGES/DRESSINGS) ×2
DURAPREP 26ML APPLICATOR (WOUND CARE) ×2 IMPLANT
ELECT BLADE TIP CTD 4 INCH (ELECTRODE) ×2 IMPLANT
ELECT REM PT RETURN 15FT ADLT (MISCELLANEOUS) ×2 IMPLANT
GLOVE BIO SURGEON STRL SZ8 (GLOVE) ×4 IMPLANT
GLOVE BIOGEL PI IND STRL 8 (GLOVE) ×2 IMPLANT
GLOVE BIOGEL PI INDICATOR 8 (GLOVE) ×2
GOWN STRL REUS W/TWL XL LVL3 (GOWN DISPOSABLE) ×4 IMPLANT
HEAD FEM STD 32X+9 STRL (Hips) ×2 IMPLANT
HOLDER FOLEY CATH W/STRAP (MISCELLANEOUS) ×2 IMPLANT
KIT TURNOVER KIT A (KITS) IMPLANT
MANIFOLD NEPTUNE II (INSTRUMENTS) ×2 IMPLANT
NEEDLE HYPO 22GX1.5 SAFETY (NEEDLE) ×2 IMPLANT
NS IRRIG 1000ML POUR BTL (IV SOLUTION) ×2 IMPLANT
PACK ANTERIOR HIP CUSTOM (KITS) ×2 IMPLANT
PENCIL SMOKE EVACUATOR (MISCELLANEOUS) IMPLANT
PROTECTOR NERVE ULNAR (MISCELLANEOUS) ×2 IMPLANT
RTRCTR WOUND ALEXIS 18CM MED (MISCELLANEOUS) ×2
SUT ETHIBOND NAB CT1 #1 30IN (SUTURE) ×4 IMPLANT
SUT VIC AB 1 CT1 36 (SUTURE) ×2 IMPLANT
SUT VIC AB 2-0 CT1 27 (SUTURE) ×1
SUT VIC AB 2-0 CT1 TAPERPNT 27 (SUTURE) ×1 IMPLANT
SUT VICRYL AB 3-0 FS1 BRD 27IN (SUTURE) ×2 IMPLANT
SUT VLOC 180 0 24IN GS25 (SUTURE) ×2 IMPLANT
SYR 50ML LL SCALE MARK (SYRINGE) ×2 IMPLANT
TRAY FOLEY MTR SLVR 16FR STAT (SET/KITS/TRAYS/PACK) ×2 IMPLANT
YANKAUER SUCT BULB TIP 10FT TU (MISCELLANEOUS) ×2 IMPLANT

## 2019-08-15 NOTE — Anesthesia Postprocedure Evaluation (Signed)
Anesthesia Post Note  Patient: ALLEAN SCHWIEBERT  Procedure(s) Performed: TOTAL HIP ARTHROPLASTY ANTERIOR APPROACH (Right Hip)     Patient location during evaluation: PACU Anesthesia Type: Spinal Level of consciousness: awake and alert and oriented Pain management: pain level controlled Vital Signs Assessment: post-procedure vital signs reviewed and stable Respiratory status: spontaneous breathing, nonlabored ventilation and respiratory function stable Cardiovascular status: blood pressure returned to baseline Postop Assessment: no apparent nausea or vomiting and spinal receding Anesthetic complications: no    Last Vitals:  Vitals:   08/15/19 1600 08/15/19 1615  BP: (!) 154/75 (!) 156/75  Pulse: 66 66  Resp: 17 (!) 21  Temp: (!) 36.4 C (!) 36.3 C  SpO2: 97% 98%    Last Pain:  Vitals:   08/15/19 1615  TempSrc:   PainSc: 0-No pain                 Brennan Bailey

## 2019-08-15 NOTE — Anesthesia Procedure Notes (Addendum)
Spinal  Patient location during procedure: OR Start time: 08/15/2019 1:25 PM End time: 08/15/2019 1:34 PM Staffing Performed: resident/CRNA  Resident/CRNA: Silas [place], CRNA Preanesthetic Checklist Completed: patient identified, IV checked, site marked, risks and benefits discussed, surgical consent, monitors and equipment checked, pre-op evaluation and timeout performed Spinal Block Patient position: sitting Prep: DuraPrep Patient monitoring: heart rate, cardiac monitor, continuous pulse ox and blood pressure Approach: midline Location: L3-4 Injection technique: single-shot Needle Needle type: Pencan  Needle gauge: 24 G Needle length: 9 cm Assessment Sensory level: T4 Additional Notes IV functioning, monitors applied to pt. Expiration date of kit checked and confirmed to be in date. Sterile prep and drape, hand hygiene and sterile gloved used. Pt was positioned and spine was prepped in sterile fashion. Skin was anesthetized with lidocaine. Free flow of clear CSF obtained prior to injecting local anesthetic into CSF x 1 attempt. Spinal needle aspirated freely following injection. Needle was carefully withdrawn, and pt tolerated procedure well. Loss of motor and sensory on exam post injection.

## 2019-08-15 NOTE — Interval H&P Note (Signed)
History and Physical Interval Note:  08/15/2019 1:17 PM  Wendy Patton  has presented today for surgery, with the diagnosis of PAINFUL RIGHT TOTAL HIP REPLACEMENT.  The various methods of treatment have been discussed with the patient and family. After consideration of risks, benefits and other options for treatment, the patient has consented to  Procedure(s): TOTAL HIP ARTHROPLASTY ANTERIOR APPROACH (Right) as a surgical intervention.  The patient's history has been reviewed, patient examined, no change in status, stable for surgery.  I have reviewed the patient's chart and labs.  Questions were answered to the patient's satisfaction.     [name]

## 2019-08-15 NOTE — Anesthesia Preprocedure Evaluation (Signed)
Anesthesia Evaluation  Patient identified by MRN, date of birth, ID band Patient awake    Reviewed: Allergy & Precautions, H&P , NPO status , Patient's Chart, lab work & pertinent test results  History of Anesthesia Complications (+) PONV, DIFFICULT AIRWAY and history of anesthetic complications  Airway Mallampati: IV   Neck ROM: Full  Mouth opening: Limited Mouth Opening  Dental no notable dental hx. (+) Teeth Intact, Dental Advisory Given   Pulmonary neg pulmonary ROS,    Pulmonary exam normal breath sounds clear to auscultation       Cardiovascular + Valvular Problems/Murmurs MVP  Rhythm:Regular Rate:Normal  EKG: 04/14/2019 Rate 78 bpm Sinus rhythm with premature atrial complexes Right bundle branch block  Left posterior fascicular block  Bifascicular block  Inferior infarct, age undetermined  CV: Echo 10/28/2017 Study Conclusions  - Left ventricle: The cavity size was normal. There was mild concentric hypertrophy. Systolic function was normal. The estimated ejection fraction was in the range of 60% to 65%. Wall motion was normal; there were no regional wall motion abnormalities. Features are consistent with a pseudonormal left ventricular filling pattern, with concomitant abnormal relaxation and increased filling pressure (grade 2 diastolic dysfunction). - Left atrium: The atrium was mildly dilated. - Right ventricle: Systolic function was normal. - Pulmonary arteries: Systolic pressure was mildly elevated. PA peak pressure: 38 mm Hg (S). - Pericardium, extracardiac: A trivial pericardial effusion was identified.   Neuro/Psych  Headaches, negative psych ROS   GI/Hepatic Neg liver ROS, GERD  Medicated and Controlled,  Endo/Other  negative endocrine ROS  Renal/GU negative Renal ROS  negative genitourinary   Musculoskeletal  (+) Arthritis , Osteoarthritis,    Abdominal Normal abdominal  exam  (+)   Peds  Hematology  (+) anemia , plt 284, hct 41.9   Anesthesia Other Findings Multiple surgeries for TMJ, limited mouth opening. Hx mandible fx s/p surgery  Reproductive/Obstetrics negative OB ROS                             Anesthesia Physical  Anesthesia Plan  ASA: III  Anesthesia Plan: Spinal and MAC   Post-op Pain Management:    Induction: Intravenous  PONV Risk Score and Plan: 4 or greater and Propofol infusion, Ondansetron, Dexamethasone and Treatment may vary due to age or medical condition  Airway Management Planned: Natural Airway and Simple Face Mask  Additional Equipment: None  Intra-op Plan:   Post-operative Plan:   Informed Consent: I have reviewed the patients History and Physical, chart, labs and discussed the procedure including the risks, benefits and alternatives for the proposed anesthesia with the patient or authorized representative who has indicated his/her understanding and acceptance.       Plan Discussed with: CRNA  Anesthesia Plan Comments: (On 08/16/07 she had a hysterectomy at [facility]. Rapid sequence oral intubation was performed with successful intubation with a 6.0 ETT after two attempts using a Miller 3 blade, stylet, and tooth guard. )        Anesthesia Quick Evaluation

## 2019-08-15 NOTE — Evaluation (Signed)
Physical Therapy Evaluation Patient Details Name: Wendy Patton MRN: [id number] DOB: [birth year] Today's Date: 08/15/2019   History of Present Illness  R AA-THA  Clinical Impression  Pt ambulated 180' with RW, no loss of balance. Instructed pt in THA HEP and completed stair training. From PT standpoint, she is ready to DC home.     Follow Up Recommendations Follow surgeon's recommendation for DC plan and follow-up therapies    Equipment Recommendations  None recommended by PT    Recommendations for Other Services       Precautions / Restrictions Precautions Precautions: Fall Restrictions Weight Bearing Restrictions: No Other Position/Activity Restrictions: WBAT      Mobility  Bed Mobility Overal bed mobility: Needs Assistance Bed Mobility: Supine to Sit     Supine to sit: Min assist     General bed mobility comments: min A for RLE  Transfers Overall transfer level: Needs assistance Equipment used: Rolling walker (2 wheeled) Transfers: Sit to/from Stand Sit to Stand: Min guard         General transfer comment: VCs hand placement  Ambulation/Gait Ambulation/Gait assistance: Min guard Gait Distance (Feet): 180 Feet Assistive device: Rolling walker (2 wheeled) Gait Pattern/deviations: Step-through pattern;Step-to pattern;Decreased stride length Gait velocity: decr   General Gait Details: steady with RW, no loss of balance  Stairs Stairs: Yes Stairs assistance: Min assist Stair Management: No rails;Backwards;Step to pattern;With walker Number of Stairs: 3 General stair comments: VCs sequencing, min A to steady RW  Wheelchair Mobility    Modified Rankin (Stroke Patients Only)       Balance Overall balance assessment: Modified Independent                                           Pertinent Vitals/Pain Pain Assessment: 0-10 Pain Score: 3  Pain Location: R hip Pain Descriptors / Indicators: Sore Pain Intervention(s): Limited  activity within patient's tolerance;Monitored during session;Premedicated before session    Home Living Family/patient expects to be discharged to:: Private residence Living Arrangements: Spouse/significant other Available Help at Discharge: Family;Available 24 hours/day Type of Home: House Home Access: Stairs to enter Entrance Stairs-Rails: None Entrance Stairs-Number of Steps: 3 Home Layout: One level Home Equipment: Walker - 2 wheels;Bedside commode      Prior Function Level of Independence: Independent               Hand Dominance        Extremity/Trunk Assessment   Upper Extremity Assessment Upper Extremity Assessment: Overall WFL for tasks assessed    Lower Extremity Assessment Lower Extremity Assessment: RLE deficits/detail RLE Deficits / Details: hip AAROM limited ~50% 2* pain RLE Sensation: WNL    Cervical / Trunk Assessment Cervical / Trunk Assessment: Normal  Communication   Communication: No difficulties  Cognition Arousal/Alertness: Awake/alert Behavior During Therapy: WFL for tasks assessed/performed Overall Cognitive Status: Within Functional Limits for tasks assessed                                        General Comments      Exercises Total Joint Exercises Ankle Circles/Pumps: AROM;Both;10 reps;Supine Quad Sets: AROM;Right;5 reps;Supine Short Arc Quad: AROM;Right;10 reps;Supine Heel Slides: AAROM;Right;5 reps;Supine Hip ABduction/ADduction: AAROM;Right;5 reps;Supine Long Arc Quad: AROM;Right;5 reps;Seated   Assessment/Plan    PT Assessment All further PT  needs can be met in the next venue of care  PT Problem List Decreased mobility;Decreased activity tolerance;Pain       PT Treatment Interventions      PT Goals (Current goals can be found in the Care Plan section)  Acute Rehab PT Goals Patient Stated Goal: to be able to bend over to put socks on PT Goal Formulation: All assessment and education complete, DC  therapy    Frequency     Barriers to discharge        Co-evaluation               AM-PAC PT "6 Clicks" Mobility  Outcome Measure Help needed turning from your back to your side while in a flat bed without using bedrails?: A Little Help needed moving from lying on your back to sitting on the side of a flat bed without using bedrails?: A Little Help needed moving to and from a bed to a chair (including a wheelchair)?: A Little Help needed standing up from a chair using your arms (e.g., wheelchair or bedside chair)?: A Little Help needed to walk in hospital room?: A Little Help needed climbing 3-5 steps with a railing? : A Little 6 Click Score: 18    End of Session Equipment Utilized During Treatment: Gait belt Activity Tolerance: Patient tolerated treatment well Patient left: in bed;with call bell/phone within reach Nurse Communication: Mobility status PT Visit Diagnosis: Difficulty in walking, not elsewhere classified (R26.2);Pain Pain - Right/Left: Right Pain - part of body: Hip    Time: 1754-1830 PT Time Calculation (min) (ACUTE ONLY): 36 min   Charges:   PT Evaluation $PT Eval Low Complexity: 1 Low PT Treatments $Gait Training: 8-22 mins        Blondell Reveal Kistler PT 08/15/2019  Acute Rehabilitation Services Pager [phone] Office [phone]

## 2019-08-15 NOTE — Transfer of Care (Signed)
Immediate Anesthesia Transfer of Care Note  Patient: Wendy Patton  Procedure(s) Performed: TOTAL HIP ARTHROPLASTY ANTERIOR APPROACH (Right Hip)  Patient Location: PACU  Anesthesia Type:MAC and Spinal  Level of Consciousness: awake, oriented and patient cooperative  Airway & Oxygen Therapy: Patient Spontanous Breathing and Patient connected to face mask oxygen  Post-op Assessment: Report given to RN and Post -op Vital signs reviewed and stable  Post vital signs: Reviewed and stable  Last Vitals:  Vitals Value Taken Time  BP 147/70 08/15/19 1507  Temp    Pulse 74 08/15/19 1510  Resp 19 08/15/19 1510  SpO2 100 % 08/15/19 1510  Vitals shown include unvalidated device data.  Last Pain:  Vitals:   08/15/19 1139  TempSrc: Oral         Complications: No apparent anesthesia complications

## 2019-08-15 NOTE — Op Note (Signed)
PRE-OP DIAGNOSIS:  RIGHT HIP UNSTABLE THR POST-OP DIAGNOSIS:  same PROCEDURE: RIGHT TOTAL HIP REVISION ANESTHESIA:  Spinal and MAC SURGEON:  Melrose Nakayama MD ASSISTANT:  Loni Dolly PA-C   INDICATIONS FOR PROCEDURE:  The patient is a 84 y.o. female with a long history of a painful hip.  She suffered a postop dislocation at about 10 days out and has no frank instability since but feels her hip slips and she does not trust it.  She has fallen as a result.  She is offered revision in hopes of stabilizing her hip.   Informed operative consent was obtained after discussion of possible complications including reaction to anesthesia, infection, neurovascular injury, dislocation, DVT, PE, and death.  The importance of the postoperative rehab program to optimize result was stressed with the patient.  SUMMARY OF FINDINGS AND PROCEDURE:  Under the above anesthesia through a anterior approach an the Hana table a right THR revision was performed.  The patient had a hip which was very difficult to dislocate.  We approached through her old incision. We ended up simply adding length and stability by upsizing the femoral head component from +1 to +9.  Loni Dolly PA-C assisted throughout and was invaluable to the completion of the case in that he helped position and retract while I performed the procedure.  He also closed simultaneously to help minimize OR time.  I used fluoroscopy throughout the case to check position of implants and leg lengths and read all of these views myself.  DESCRIPTION OF PROCEDURE:  The patient was taken to the OR suite where the above anesthetic was applied.  The patient was then positioned on the Hana table supine.  All bony prominences were appropriately padded.  Prep and drape was then performed in normal sterile fashion.  The patient was given vancomycin preoperative antibiotic and an appropriate time out was performed.  We then took an anterior approach to the right hip.  Dissection was  taken through adipose to the tensor fascia lata fascia.  This structure was incised longitudinally and we dissected in the intermuscular interval just medial to this muscle.  Cobra retractors were placed superior and inferior to the femoral neck superficial to the capsule.  A capsular incision was then made and the retractors were placed along the femoral neck of the prosthesis. The acetabulum was exposed and some scar tissue was excised.   With traction and external rotation we could sublux the femoral head out.  I removed the ball easily.  The stem was stable.  I then placed a +9 70m metal ball by DePuy and reduced the hip.  This was extremely stable in external rotation and extension.  Fluoro confirmed roughly equal leg lengths.  The would was irrigated again followed by re-approximation of anterior capsule with ethibond suture. Tensor fascia was repaired with V-loc suture  followed by deep closure with #O and #2 undyed vicryl.  Skin was closed with subQ stitch and steristrips followed by a sterile dressing.  EBL and IOF can be obtained from anesthesia records.  DISPOSITION:  The patient was extubated in the OR and taken to PACU in stable condition to be admitted to the Orthopedic Surgery for appropriate post-op care to include perioperative antibiotics and DVT prophylaxis.   She might potentially go home today.

## 2019-08-16 ENCOUNTER — Encounter: Payer: Self-pay | Admitting: *Deleted

## 2019-08-16 DIAGNOSIS — Z96642 Presence of left artificial hip joint: Secondary | ICD-10-CM | POA: Diagnosis not present

## 2019-08-16 DIAGNOSIS — Z7982 Long term (current) use of aspirin: Secondary | ICD-10-CM | POA: Diagnosis not present

## 2019-08-16 DIAGNOSIS — G629 Polyneuropathy, unspecified: Secondary | ICD-10-CM | POA: Diagnosis not present

## 2019-08-16 DIAGNOSIS — Q809 Congenital ichthyosis, unspecified: Secondary | ICD-10-CM | POA: Diagnosis not present

## 2019-08-16 DIAGNOSIS — K219 Gastro-esophageal reflux disease without esophagitis: Secondary | ICD-10-CM | POA: Diagnosis not present

## 2019-08-16 DIAGNOSIS — M170 Bilateral primary osteoarthritis of knee: Secondary | ICD-10-CM | POA: Diagnosis not present

## 2019-08-16 DIAGNOSIS — D649 Anemia, unspecified: Secondary | ICD-10-CM | POA: Diagnosis not present

## 2019-08-16 DIAGNOSIS — T84020D Dislocation of internal right hip prosthesis, subsequent encounter: Secondary | ICD-10-CM | POA: Diagnosis not present

## 2019-08-16 DIAGNOSIS — K589 Irritable bowel syndrome without diarrhea: Secondary | ICD-10-CM | POA: Diagnosis not present

## 2019-08-16 DIAGNOSIS — Z9181 History of falling: Secondary | ICD-10-CM | POA: Diagnosis not present

## 2019-08-16 DIAGNOSIS — G43909 Migraine, unspecified, not intractable, without status migrainosus: Secondary | ICD-10-CM | POA: Diagnosis not present

## 2019-08-16 DIAGNOSIS — E079 Disorder of thyroid, unspecified: Secondary | ICD-10-CM | POA: Diagnosis not present

## 2019-08-16 DIAGNOSIS — M792 Neuralgia and neuritis, unspecified: Secondary | ICD-10-CM | POA: Diagnosis not present

## 2019-08-16 DIAGNOSIS — I341 Nonrheumatic mitral (valve) prolapse: Secondary | ICD-10-CM | POA: Diagnosis not present

## 2019-08-16 DIAGNOSIS — M519 Unspecified thoracic, thoracolumbar and lumbosacral intervertebral disc disorder: Secondary | ICD-10-CM | POA: Diagnosis not present

## 2019-08-16 DIAGNOSIS — Z85828 Personal history of other malignant neoplasm of skin: Secondary | ICD-10-CM | POA: Diagnosis not present

## 2019-08-16 DIAGNOSIS — E7800 Pure hypercholesterolemia, unspecified: Secondary | ICD-10-CM | POA: Diagnosis not present

## 2019-08-25 DIAGNOSIS — Z96641 Presence of right artificial hip joint: Secondary | ICD-10-CM | POA: Diagnosis not present

## 2019-08-25 DIAGNOSIS — Z471 Aftercare following joint replacement surgery: Secondary | ICD-10-CM | POA: Diagnosis not present

## 2019-09-15 DIAGNOSIS — M1711 Unilateral primary osteoarthritis, right knee: Secondary | ICD-10-CM | POA: Diagnosis not present

## 2019-10-03 ENCOUNTER — Ambulatory Visit: Payer: PPO | Attending: Orthopaedic Surgery | Admitting: Physical Therapy

## 2019-10-03 ENCOUNTER — Other Ambulatory Visit: Payer: Self-pay

## 2019-10-03 ENCOUNTER — Encounter: Payer: Self-pay | Admitting: Physical Therapy

## 2019-10-03 DIAGNOSIS — G8929 Other chronic pain: Secondary | ICD-10-CM | POA: Insufficient documentation

## 2019-10-03 DIAGNOSIS — M25561 Pain in right knee: Secondary | ICD-10-CM | POA: Insufficient documentation

## 2019-10-03 DIAGNOSIS — R2689 Other abnormalities of gait and mobility: Secondary | ICD-10-CM | POA: Diagnosis not present

## 2019-10-03 DIAGNOSIS — R262 Difficulty in walking, not elsewhere classified: Secondary | ICD-10-CM | POA: Diagnosis not present

## 2019-10-03 DIAGNOSIS — M25562 Pain in left knee: Secondary | ICD-10-CM | POA: Insufficient documentation

## 2019-10-03 DIAGNOSIS — M6281 Muscle weakness (generalized): Secondary | ICD-10-CM | POA: Diagnosis not present

## 2019-10-03 NOTE — Therapy (Signed)
[facility] [address], [place], [address] Phone: [phone]   Fax:  [phone]  Physical Therapy Evaluation  Patient Details  Name: Wendy Patton MRN: [id number] Date of Birth: 1936/06/18 Referring Provider (PT): Melrose Nakayama   Encounter Date: 10/03/2019  PT End of Session - 10/04/19 1251    Visit Number  1    Number of Visits  17    Date for PT Re-Evaluation  11/28/19    PT Start Time  [id number]    PT Stop Time  1115    PT Time Calculation (min)  60 min    Activity Tolerance  Patient tolerated treatment well    Behavior During Therapy  [facility] for tasks assessed/performed       Past Medical History:  Diagnosis Date  . Anemia    after jaw surgery was on iron for short period of time  . Arthritis   . Cancer (HCC)    squamous cell  . Difficult intubation    potential for difficult airway due to limited mouth opening  . Dry skin   . GERD (gastroesophageal reflux disease)    takes Aciphex daily  . Headache(784.0)    hx of migraines   . Heart murmur   . History of migraine    last one Aug 2014 and takes Imitrex daily as needed  . History of staph infection   . IBS (irritable bowel syndrome)   . Joint pain   . Joint swelling   . Laryngopharyngeal reflux   . MVP (mitral valve prolapse)   . Neuralgia   . Neuropathy    takes gabapentin daily  . PONV (postoperative nausea and vomiting)    TMJ surgery several times  . Rheumatic fever    as a child  . Shortness of breath    with exertion  . Thyroid disease     Past Surgical History:  Procedure Laterality Date  . ABDOMINAL HYSTERECTOMY  2009  . BREAST DUCTAL SYSTEM EXCISION Right 01/11/2014   Procedure: MAJOR DUCT EXCISION RIGHT BREAST;  Surgeon: Stark Klein, MD;  Location: [facility];  Service: General;  Laterality: Right;  . BREAST EXCISIONAL BIOPSY Right 2015   neg  . BREAST EXCISIONAL BIOPSY Left 2010   benign papilloma removed  . BREAST  EXCISIONAL BIOPSY Left 1975   neg  . BREAST SURGERY  1975, 2010   benign breast tumor  . breat excision  1975   benign breast tumor excision  . CATARACT EXTRACTION W/PHACO Right 07/15/2016   Procedure: CATARACT EXTRACTION PHACO AND INTRAOCULAR LENS PLACEMENT (IOC);  Surgeon: Estill Cotta, MD;  Location: [facility];  Service: Ophthalmology;  Laterality: Right;  Lot # [id number] H [place]: 01:38.6 AP%: 25.0 CDE: 46.89  . CATARACT EXTRACTION W/PHACO Left 09/02/2016   Procedure: CATARACT EXTRACTION PHACO AND INTRAOCULAR LENS PLACEMENT (IOC);  Surgeon: Estill Cotta, MD;  Location: [facility];  Service: Ophthalmology;  Laterality: Left;  [place] 2:05.3 AP% 24.9 CDE 51.84 Fluid pack lot # [id number]  . cervial polyps  1999  . [place]  . COLONOSCOPY    . CYSTECTOMY  1962   vaginal cyst removal  . DILATION AND CURETTAGE OF UTERUS  1984  . ESOPHAGOGASTRODUODENOSCOPY    . granuloma  2002   chin granuloma removed  . [place], 2002, 2004   prosthesis, condyles implants, fossa replaced  . SIGMOIDOSCOPY  1993  . SQUAMOUS CELL CARCINOMA EXCISION  2008   left ankle/skin graft  . [place]  . THYROIDECTOMY  2006   left  . TONSILLECTOMY AND ADENOIDECTOMY  1944  . TOTAL HIP ARTHROPLASTY Left 09/12/2013   Procedure: LEFT TOTAL HIP ARTHROPLASTY ANTERIOR APPROACH;  Surgeon: [name], MD;  Location: [place];  Service: Orthopedics;  Laterality: Left;  . TOTAL HIP ARTHROPLASTY Right 04/18/2019   Procedure: RIGHT TOTAL HIP ARTHROPLASTY ANTERIOR APPROACH;  Surgeon: Melrose Nakayama, MD;  Location: [facility];  Service: Orthopedics;  Laterality: Right;  . TOTAL HIP ARTHROPLASTY Right 08/15/2019   Procedure: TOTAL HIP ARTHROPLASTY ANTERIOR APPROACH;  Surgeon: Melrose Nakayama, MD;  Location: [facility];  Service: Orthopedics;  Laterality: Right;  . TRAPEZIUM RESECTION  2000, 2004   removal right & left hand trapezium  . TUBAL LIGATION     . uterine polyps  2008  . vaginal cyst removed  1962    There were no vitals filed for this visit.     OBJECTIVE  Mental Status Patient is oriented to person, place and time.  Recent memory is intact.  Remote memory is intact.  Attention span and concentration are intact.  Expressive speech is intact.  Patient's fund of knowledge is within normal limits for educational level.  SENSATION: Grossly intact to light touch bilateral LEs as determined by testing dermatomes L2-S2 Proprioception and hot/cold testing deferred on this date   MUSCULOSKELETAL: Tremor: None Bulk: Normal Tone: Normal No visible step-off along spinal column  Posture Lumbar lordosis: WNL Iliac crest height: equal bilaterally Lumbar lateral shift: negative Lower crossed syndrome (tight hip flexors and erector spinae; weak gluts and abs): negative  Gait R hip ER with eversion during swing, with quick hip IR and foot inversion after heel strike for foot flat. Increased R knee valgus throughout gait cycle on R side   Palpation TTP at R glute, hypermobility of R patella, rests laterally    Strength (out of 5) R/L 5/5 Hip flexion 3+/4+ Hip ER 5/5 Hip IR 4+/4- Hip abduction 5/5 Hip adduction 3+/3+ Hip extension 4*/4 Knee extension with audible and palpable popping on R 5/5 Knee flexion 5/5 Ankle dorsiflexion 5/5 Ankle plantarflexion 5 Trunk flexion 5 Trunk extension 5/5 Trunk rotation  *Indicates pain   AROM (degrees) R/L (all movements include overpressure unless otherwise stated) Hip IR (0-45): R: 45  L:21 Hip ER (0-45): WNL bilat Hip Flexion (0-125): WNL bilat Hip Abduction (0-40): WNL bilat Hip extension (0-15): 15bilat *Indicates pain   PROM (degrees) PROM = AROM Knee flex (heel slide w/ overpressure) R: 137d  L: 140d  In Elys position approx 90d bilat with quad tension at LLE and knee pain at R R/L (all movements include overpressure unless otherwise stated)   Muscle  Length Hamstrings: 160d bilat [place]: Positive bilat Thomas:  Positive bilat with concordant knee pain [place]:  Negative bilat   Passive Accessory Intervertebral Motion (PAIVM) Pt denies reproduction of back pain with CPA L1-L5 and UPA bilaterally L1-L5. Generally hypomobile throughout  Passive Physiological Intervertebral Motion (PPIVM) Normal flexion and extension with PPIVM testing   SPECIAL TESTS  Hip: FABER (SN 81): positive bilat FADIR (SN 94): Negative bilat Hip scour (SN 50): Negative bilat  Functional Tasks Squatting: bilat knee valgus, lateral patella tracking  Sit to stand: Same as squat with definite need of UE for transfer  Lateral Step Down RLE unable with knee valgus, unable to initiate d/t weakness, with stabilization from PT able to initate but painful lower with audible and  palpable popping LLE was WNL   Stair Ascent: Step to with LLE leading 4.4sec Descent: Step to with RLE leading 5sec   5xSTS 15sec 10MWT 0.9 m/s  Ther-Ex Educated patient on patellar tracking and how decreased hip strength and activation can affect the rest of the lower chain. Education on anatomy involved and functional movements  Mini Squat GTB x10 with heavy cuing for glute activation with good carry over Bridge with GTB for abd resistance x10 with good carry over of cuing corrections Education on HEP 3x 10 2-3/week for strengthening and motor control                         Objective measurements completed on examination: See above findings.              PT Education - 10/03/19 1030    Education Details  Patient was educated on diagnosis, anatomy and pathology involved, prognosis, role of PT, and was given an HEP, demonstrating exercise with proper form following verbal and tactile cues, and was given a paper hand out to continue exercise at home. Pt was educated on and agreed to plan of care.    Person(s) Educated  Patient    Methods   Explanation;Demonstration;Tactile cues;Verbal cues;Handout    Comprehension  Verbalized understanding;Returned demonstration;Verbal cues required;Tactile cues required       PT Short Term Goals - 10/04/19 1617      PT SHORT TERM GOAL #1   Title  Pt will be independent with HEP in order to improve strength and decrease back pain in order to improve pain-free function at home and work.    Baseline  10/03/19 HEP given    Time  4    Period  Weeks    Status  New        PT Long Term Goals - 10/04/19 1617      PT LONG TERM GOAL #1   Title  Pt will increase 10MWT by at least 0.13 m/s in order to demonstrate clinically significant improvement in community ambulation.    Baseline  10/03/19 0.10m/s    Time  8    Period  Weeks    Status  New      PT LONG TERM GOAL #2   Title  Pt will decrease 5TSTS by at least 3 seconds in order to demonstrate clinically significant improvement in LE strength    Baseline  10/03/19 15sec    Time  8    Period  Weeks    Status  New      PT LONG TERM GOAL #3   Title  Patinet will demonstrate reciprocal stair neogiation with unilateral handrail, 1 step/second, to demonstrate PLOF and safety negotiating home    Baseline  10/03/19 Step to leading with LLE ascent (0.91steps/sec); Step to leading with RLE descent (0.8steps/sec)    Time  8    Period  Weeks    Status  New      PT LONG TERM GOAL #4   Title  Patient will increase FOTO score to 65 to demonstrate predicted increase in functional mobility to complete ADLs    Baseline  10/03/19 55    Time  8    Period  Weeks    Status  New             Plan - 10/04/19 1623    Clinical Impression Statement  Pt is a 84 year old female s/p R hip THA  revision 08/15/19 following dislocation from original [place] Sept 2020. Impairments in lateral R patellar Would benefit from skilled PT to address above deficits and promote optimal return to PLOF.tracking, with R knee pain, decreased hip strength, decreased hip motion, and  decreased motor control. Activity limitations in prolonged ambulation, standing, squatting, stopping, and stair negotation; inhibiting full participation in household ADLs and community ambulation and errands.    Personal Factors and Comorbidities  Age;Behavior Pattern;Fitness;Past/Current Experience;Comorbidity 1;Comorbidity 2;Time since onset of injury/illness/exacerbation;Sex    Examination-Activity Limitations  Lift;Stand;Transfers;Carry;Sit;Squat;Stairs    Examination-Participation Restrictions  Community Activity;Meal Prep;Yard Work;Cleaning;Shop    Stability/Clinical Decision Making  Evolving/Moderate complexity    Clinical Decision Making  Moderate    Rehab Potential  Good    PT Frequency  2x / week    PT Duration  8 weeks    PT Treatment/Interventions  ADLs/Self Care Home Management;Electrical Stimulation;Therapeutic activities;Patient/family education;Joint Manipulations;Spinal Manipulations;Passive range of motion;Dry needling;Manual techniques;Taping;Therapeutic exercise;Iontophoresis 4mg /ml Dexamethasone;Gait training;Stair training;Neuromuscular re-education;Functional mobility training;Ultrasound;Cryotherapy;Traction;Moist Heat    PT Next Visit Plan  motor control, glute activation, balance?    PT Home Exercise Plan  mini squat with band, bridge with band    Consulted and Agree with Plan of Care  Patient       Patient will benefit from skilled therapeutic intervention in order to improve the following deficits and impairments:  Pain, Improper body mechanics, Increased fascial restricitons, Abnormal gait, Decreased coordination, Decreased mobility, Postural dysfunction, Impaired flexibility, Difficulty walking, Decreased balance, Decreased activity tolerance, Decreased endurance, Decreased range of motion, Decreased strength  Visit Diagnosis: Chronic pain of left knee  Chronic pain of right knee  Muscle weakness (generalized)  Other abnormalities of gait and  mobility     Problem List Patient Active Problem List   Diagnosis Date Noted  . History of revision of total replacement of right hip joint 08/15/2019  . Primary localized osteoarthritis of right hip 04/18/2019  . Primary osteoarthritis of right hip 04/18/2019  . Pectus excavatum 10/12/2017  . 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 Perimenopausal atrophic vaginitis 09/22/2016  . Menopausal hot flushes 09/22/2016  . Mitral valve prolapse 01/13/2016  . Herniation of nucleus pulposus 05/01/2015  . Hypercholesteremia 05/01/2015  . Osteoarthritis of both knees 05/01/2015  . History of migraine headaches 05/01/2015  . Esophageal reflux 01/10/2015  . S/P total hip arthroplasty 09/12/2013   Shelton Silvas PT, DPT Shelton Silvas 10/04/2019, 4:31 PM  [facility] [address] [place], [address] Phone: [phone]   Fax:  [phone]  Name: Wendy Patton MRN: [id number] Date of Birth: 09-20-1935

## 2019-10-04 ENCOUNTER — Encounter: Payer: Self-pay | Admitting: Physical Therapy

## 2019-10-06 ENCOUNTER — Ambulatory Visit: Payer: PPO | Admitting: Physical Therapy

## 2019-10-06 ENCOUNTER — Encounter: Payer: Self-pay | Admitting: Physical Therapy

## 2019-10-06 ENCOUNTER — Other Ambulatory Visit: Payer: Self-pay

## 2019-10-06 DIAGNOSIS — R262 Difficulty in walking, not elsewhere classified: Secondary | ICD-10-CM

## 2019-10-06 DIAGNOSIS — R2689 Other abnormalities of gait and mobility: Secondary | ICD-10-CM

## 2019-10-06 DIAGNOSIS — M6281 Muscle weakness (generalized): Secondary | ICD-10-CM

## 2019-10-06 DIAGNOSIS — G8929 Other chronic pain: Secondary | ICD-10-CM

## 2019-10-06 DIAGNOSIS — M25562 Pain in left knee: Secondary | ICD-10-CM | POA: Diagnosis not present

## 2019-10-06 NOTE — Therapy (Signed)
[facility] [address], [place], [address] Phone: [phone]   Fax:  [phone]  Physical Therapy Treatment  Patient Details  Name: Wendy Patton MRN: [id number] Date of Birth: [birth year] Referring Provider (PT): Melrose Nakayama   Encounter Date: 82/12/2019  PT End of Session - 10/06/19 0849    Visit Number  2    Number of Visits  17    Date for PT Re-Evaluation  11/28/19    PT Start Time  0832    PT Stop Time  0910    PT Time Calculation (min)  38 min    Activity Tolerance  Patient tolerated treatment well    Behavior During Therapy  [facility] for tasks assessed/performed       Past Medical History:  Diagnosis Date  . Anemia    after jaw surgery was on iron for short period of time  . Arthritis   . Cancer (HCC)    squamous cell  . Difficult intubation    potential for difficult airway due to limited mouth opening  . Dry skin   . GERD (gastroesophageal reflux disease)    takes Aciphex daily  . Headache(784.0)    hx of migraines   . Heart murmur   . History of migraine    last one Aug 2014 and takes Imitrex daily as needed  . History of staph infection   . IBS (irritable bowel syndrome)   . Joint pain   . Joint swelling   . Laryngopharyngeal reflux   . MVP (mitral valve prolapse)   . Neuralgia   . Neuropathy    takes gabapentin daily  . PONV (postoperative nausea and vomiting)    TMJ surgery several times  . Rheumatic fever    as a child  . Shortness of breath    with exertion  . Thyroid disease     Past Surgical History:  Procedure Laterality Date  . ABDOMINAL HYSTERECTOMY  2009  . BREAST DUCTAL SYSTEM EXCISION Right 01/11/2014   Procedure: MAJOR DUCT EXCISION RIGHT BREAST;  Surgeon: Stark Klein, MD;  Location: [facility];  Service: General;  Laterality: Right;  . BREAST EXCISIONAL BIOPSY Right 2015   neg  . BREAST EXCISIONAL BIOPSY Left 2010   benign papilloma removed  . BREAST  EXCISIONAL BIOPSY Left 1975   neg  . BREAST SURGERY  1975, 2010   benign breast tumor  . breat excision  1975   benign breast tumor excision  . CATARACT EXTRACTION W/PHACO Right 07/15/2016   Procedure: CATARACT EXTRACTION PHACO AND INTRAOCULAR LENS PLACEMENT (IOC);  Surgeon: Estill Cotta, MD;  Location: [facility];  Service: Ophthalmology;  Laterality: Right;  Lot # [id number] H [place]: 01:38.6 AP%: 25.0 CDE: 46.89  . CATARACT EXTRACTION W/PHACO Left 09/02/2016   Procedure: CATARACT EXTRACTION PHACO AND INTRAOCULAR LENS PLACEMENT (IOC);  Surgeon: Estill Cotta, MD;  Location: [facility];  Service: Ophthalmology;  Laterality: Left;  [place] 2:05.3 AP% 24.9 CDE 51.84 Fluid pack lot # [id number]  . cervial polyps  1999  . [place]  . COLONOSCOPY    . CYSTECTOMY  1962   vaginal cyst removal  . DILATION AND CURETTAGE OF UTERUS  1984  . ESOPHAGOGASTRODUODENOSCOPY    . granuloma  2002   chin granuloma removed  . [place], 2002, 2004   prosthesis, condyles implants, fossa replaced  . SIGMOIDOSCOPY  1993  . SQUAMOUS CELL CARCINOMA EXCISION  2008   left ankle/skin graft  . [place]  . THYROIDECTOMY  2006   left  . TONSILLECTOMY AND ADENOIDECTOMY  1944  . TOTAL HIP ARTHROPLASTY Left 09/12/2013   Procedure: LEFT TOTAL HIP ARTHROPLASTY ANTERIOR APPROACH;  Surgeon: [name], MD;  Location: [place];  Service: Orthopedics;  Laterality: Left;  . TOTAL HIP ARTHROPLASTY Right 04/18/2019   Procedure: RIGHT TOTAL HIP ARTHROPLASTY ANTERIOR APPROACH;  Surgeon: Melrose Nakayama, MD;  [facility];  Service: Orthopedics;  Laterality: Right;  . TOTAL HIP ARTHROPLASTY Right 08/15/2019   Procedure: TOTAL HIP ARTHROPLASTY ANTERIOR APPROACH;  Surgeon: Melrose Nakayama, MD;  [facility];  Service: Orthopedics;  Laterality: Right;  . TRAPEZIUM RESECTION  2000, 2004   removal right & left hand trapezium  . TUBAL LIGATION     . uterine polyps  2008  . vaginal cyst removed  1962    There were no vitals filed for this visit.  Subjective Assessment - 10/06/19 0835    Subjective  Patient reports L knee pain this am 7/10, and that she has a question about one of her home exercises.    Pertinent History  Pt is a 84 year old female presenting s/p R THA in September 2020 with revision 08/15/19. She had HHPT for 3 weeks and has weaned off AD completely, is completing all errands and community ambulation, and driving. She reports she has good mobility and has been able to bathe, dress, and complete all ADLs. Patient reports her chief complaint of R knee pain with stiffness. Patient reports she feels her patella popping with every step of walking, with bending/stooping, lifting, and most movement of the knee. She reports this popping is painful and the knee feels unsteady. She has started using voltaran and reports this helps pain. Worst R knee pain 6/10 and 0/10 at rest. Patient has stairs to enter her home, she has stairs inside the house that she does not use, but that these are painful and she has to use a step to gait patter. Pt is anticiating a R TKA this fall. Pt denies N/V, B&B changes, unexplained weight fluctuation, saddle paresthesia, fever, night sweats, or unrelenting night pain at this time.    How long can you sit comfortably?  Not limited by RLE, but about 2 hours d/t back pain    How long can you stand comfortably?  unlimited    How long can you walk comfortably?  unlimited    Diagnostic tests  None of R knee    Patient Stated Goals  Strengthen knee so knee cap doesn't wobble    Pain Onset  1 to 4 weeks ago         Ther-Ex Nustep L1 seat setting L9 UE 10 44mins, attempted without UE, too difficult for LUE push without, cuing for alignment with good carry over - Standing mini squat at treadmill bar with GTB 2x 10 heavy cuing for proper form with glute activation with good carry over - Sidestepping with GTB 3c  47ftR/10ftL with cuing to prevent R hip rotation with R step and cuing for "step" opposed to slide - Hip hike LLE x10; with RLE swing 2x 10 with demo and min cuing for proper technique with good carry over following - Lateral step down 3x 10  from foam pad with difficulty with glute max activation, that improves throughout set - Bridge GTB 2x10 with min cuing to maintain abd tension on lower with good carry over -  SL bridge LLE 2x 8 with decreased height from double, cuing for glute activation with good carry over                  PT Education - 10/06/19 0848    Education Details  therex form, posture    Person(s) Educated  Patient    Methods  Explanation;Demonstration;Tactile cues;Verbal cues    Comprehension  Verbalized understanding;Returned demonstration;Verbal cues required;Tactile cues required       PT Short Term Goals - 10/04/19 1617      PT SHORT TERM GOAL #1   Title  Pt will be independent with HEP in order to improve strength and decrease back pain in order to improve pain-free function at home and work.    Baseline  10/03/19 HEP given    Time  4    Period  Weeks    Status  New        PT Long Term Goals - 10/04/19 1617      PT LONG TERM GOAL #1   Title  Pt will increase 10MWT by at least 0.13 m/s in order to demonstrate clinically significant improvement in community ambulation.    Baseline  10/03/19 0.50m/s    Time  8    Period  Weeks    Status  New      PT LONG TERM GOAL #2   Title  Pt will decrease 5TSTS by at least 3 seconds in order to demonstrate clinically significant improvement in LE strength    Baseline  10/03/19 15sec    Time  8    Period  Weeks    Status  New      PT LONG TERM GOAL #3   Title  Patinet will demonstrate reciprocal stair neogiation with unilateral handrail, 1 step/second, to demonstrate PLOF and safety negotiating home    Baseline  10/03/19 Step to leading with LLE ascent (0.91steps/sec); Step to leading with RLE descent  (0.8steps/sec)    Time  8    Period  Weeks    Status  New      PT LONG TERM GOAL #4   Title  Patient will increase FOTO score to 65 to demonstrate predicted increase in functional mobility to complete ADLs    Baseline  10/03/19 55    Time  8    Period  Weeks    Status  New            Plan - 10/06/19 0854    Clinical Impression Statement  PT utilized therex progression for increased motor control and hip strengthening/stabilization with good success. Patient does require multimodal cuing, and demonstration to achieve proper technique, but is motivated to accomplish this throughout session. PT modified therex as needed within pain limitations, and patient is able to adjust to this well. PT reviewed HEP with patient with minimal corrections needed with good compliance. PT will continue progression as able.    Personal Factors and Comorbidities  Age;Behavior Pattern;Fitness;Past/Current Experience;Comorbidity 1;Comorbidity 2;Time since onset of injury/illness/exacerbation;Sex    Examination-Activity Limitations  Lift;Stand;Transfers;Carry;Sit;Squat;Stairs    Examination-Participation Restrictions  Community Activity;Meal Prep;Yard Work;Cleaning;Shop    Stability/Clinical Decision Making  Evolving/Moderate complexity    Clinical Decision Making  Moderate    Rehab Potential  Good    PT Frequency  2x / week    PT Duration  8 weeks    PT Treatment/Interventions  ADLs/Self Care Home Management;Electrical Stimulation;Therapeutic activities;Patient/family education;Joint Manipulations;Spinal Manipulations;Passive range of motion;Dry needling;Manual techniques;Taping;Therapeutic exercise;Iontophoresis 4mg /ml Dexamethasone;Gait training;Stair  training;Neuromuscular re-education;Functional mobility training;Ultrasound;Cryotherapy;Traction;Moist Heat    PT Next Visit Plan  motor control, glute activation, balance?    PT Home Exercise Plan  mini squat with band, bridge with band    Consulted and Agree  with Plan of Care  Patient       Patient will benefit from skilled therapeutic intervention in order to improve the following deficits and impairments:  Pain, Improper body mechanics, Increased fascial restricitons, Abnormal gait, Decreased coordination, Decreased mobility, Postural dysfunction, Impaired flexibility, Difficulty walking, Decreased balance, Decreased activity tolerance, Decreased endurance, Decreased range of motion, Decreased strength  Visit Diagnosis: Chronic pain of left knee  Chronic pain of right knee  Muscle weakness (generalized)  Other abnormalities of gait and mobility  Difficulty in walking, not elsewhere classified     Problem List Patient Active Problem List   Diagnosis Date Noted  . History of revision of total replacement of right hip joint 08/15/2019  . Primary localized osteoarthritis of right hip 04/18/2019  . Primary osteoarthritis of right hip 04/18/2019  . Pectus excavatum 10/12/2017  . 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 Perimenopausal atrophic vaginitis 09/22/2016  . Menopausal hot flushes 09/22/2016  . Mitral valve prolapse 01/13/2016  . Herniation of nucleus pulposus 05/01/2015  . Hypercholesteremia 05/01/2015  . Osteoarthritis of both knees 05/01/2015  . History of migraine headaches 05/01/2015  . Esophageal reflux 01/10/2015  . S/P total hip arthroplasty 09/12/2013   Shelton Silvas PT, DPT Shelton Silvas 10/06/2019, 10:03 AM  [facility] [address] [place], [address] Phone: [phone]   Fax:  [phone]  Name: WINDIE TRIPPETT MRN: [id number] Date of Birth: 1935/12/17

## 2019-10-09 ENCOUNTER — Encounter: Payer: PPO | Admitting: Physical Therapy

## 2019-10-09 ENCOUNTER — Encounter: Payer: Self-pay | Admitting: Physical Therapy

## 2019-10-09 ENCOUNTER — Other Ambulatory Visit: Payer: Self-pay

## 2019-10-09 ENCOUNTER — Ambulatory Visit: Payer: PPO

## 2019-10-09 DIAGNOSIS — R262 Difficulty in walking, not elsewhere classified: Secondary | ICD-10-CM

## 2019-10-09 DIAGNOSIS — R2689 Other abnormalities of gait and mobility: Secondary | ICD-10-CM

## 2019-10-09 DIAGNOSIS — M6281 Muscle weakness (generalized): Secondary | ICD-10-CM

## 2019-10-09 DIAGNOSIS — M25561 Pain in right knee: Secondary | ICD-10-CM

## 2019-10-09 DIAGNOSIS — M25562 Pain in left knee: Secondary | ICD-10-CM | POA: Diagnosis not present

## 2019-10-09 DIAGNOSIS — G8929 Other chronic pain: Secondary | ICD-10-CM

## 2019-10-09 NOTE — Therapy (Addendum)
[facility] [address], [place], [address] Phone: [phone]   Fax:  [phone]  Physical Therapy Treatment  Patient Details  Name: Wendy Patton MRN: [id number] Date of Birth: 11-Mar-1936 Referring Provider (PT): Melrose Nakayama   Encounter Date: 10/09/2019  PT End of Session - 10/09/19 1521    Visit Number  3    Number of Visits  17    Date for PT Re-Evaluation  11/28/19    PT Start Time  [id number]    PT Stop Time  1558    PT Time Calculation (min)  43 min    Activity Tolerance  Patient tolerated treatment well    Behavior During Therapy  [facility] for tasks assessed/performed       Past Medical History:  Diagnosis Date  . Anemia    after jaw surgery was on iron for short period of time  . Arthritis   . Cancer (HCC)    squamous cell  . Difficult intubation    potential for difficult airway due to limited mouth opening  . Dry skin   . GERD (gastroesophageal reflux disease)    takes Aciphex daily  . Headache(784.0)    hx of migraines   . Heart murmur   . History of migraine    last one Aug 2014 and takes Imitrex daily as needed  . History of staph infection   . IBS (irritable bowel syndrome)   . Joint pain   . Joint swelling   . Laryngopharyngeal reflux   . MVP (mitral valve prolapse)   . Neuralgia   . Neuropathy    takes gabapentin daily  . PONV (postoperative nausea and vomiting)    TMJ surgery several times  . Rheumatic fever    as a child  . Shortness of breath    with exertion  . Thyroid disease     Past Surgical History:  Procedure Laterality Date  . ABDOMINAL HYSTERECTOMY  2009  . BREAST DUCTAL SYSTEM EXCISION Right 01/11/2014   Procedure: MAJOR DUCT EXCISION RIGHT BREAST;  Surgeon: Stark Klein, MD;  [facility];  Service: General;  Laterality: Right;  . BREAST EXCISIONAL BIOPSY Right 2015   neg  . BREAST EXCISIONAL BIOPSY Left 2010   benign papilloma removed  . BREAST  EXCISIONAL BIOPSY Left 1975   neg  . BREAST SURGERY  1975, 2010   benign breast tumor  . breat excision  1975   benign breast tumor excision  . CATARACT EXTRACTION W/PHACO Right 07/15/2016   Procedure: CATARACT EXTRACTION PHACO AND INTRAOCULAR LENS PLACEMENT (IOC);  Surgeon: Estill Cotta, MD;  Location: [facility];  Service: Ophthalmology;  Laterality: Right;  Lot # [id number] H [place]: 01:38.6 AP%: 25.0 CDE: 46.89  . CATARACT EXTRACTION W/PHACO Left 09/02/2016   Procedure: CATARACT EXTRACTION PHACO AND INTRAOCULAR LENS PLACEMENT (IOC);  Surgeon: Estill Cotta, MD;  Location: [facility];  Service: Ophthalmology;  Laterality: Left;  [place] 2:05.3 AP% 24.9 CDE 51.84 Fluid pack lot # [id number]  . cervial polyps  1999  . [place]  . COLONOSCOPY    . CYSTECTOMY  1962   vaginal cyst removal  . DILATION AND CURETTAGE OF UTERUS  1984  . ESOPHAGOGASTRODUODENOSCOPY    . granuloma  2002   chin granuloma removed  . [place], 2002, 2004   prosthesis, condyles implants, fossa replaced  . SIGMOIDOSCOPY  1993  . SQUAMOUS CELL CARCINOMA EXCISION  2008   left ankle/skin graft  . [place]  . THYROIDECTOMY  2006   left  . TONSILLECTOMY AND ADENOIDECTOMY  1944  . TOTAL HIP ARTHROPLASTY Left 09/12/2013   Procedure: LEFT TOTAL HIP ARTHROPLASTY ANTERIOR APPROACH;  Surgeon: [name], MD;  Location: [facility];  Service: Orthopedics;  Laterality: Left;  . TOTAL HIP ARTHROPLASTY Right 04/18/2019   Procedure: RIGHT TOTAL HIP ARTHROPLASTY ANTERIOR APPROACH;  Surgeon: Melrose Nakayama, MD;  [facility];  Service: Orthopedics;  Laterality: Right;  . TOTAL HIP ARTHROPLASTY Right 08/15/2019   Procedure: TOTAL HIP ARTHROPLASTY ANTERIOR APPROACH;  Surgeon: Melrose Nakayama, MD;  [facility];  Service: Orthopedics;  Laterality: Right;  . TRAPEZIUM RESECTION  2000, 2004   removal right & left hand trapezium  . TUBAL LIGATION     . uterine polyps  2008  . vaginal cyst removed  1962    There were no vitals filed for this visit.  Subjective Assessment - 10/09/19 1518    Subjective  Patient reported her R knee pain is 2/10, stated she is doing her exercises at home consistently.    Pertinent History  Pt is a 84 year old female presenting s/p R THA in September 2020 with revision 08/15/19. She had HHPT for 3 weeks and has weaned off AD completely, is completing all errands and community ambulation, and driving. She reports she has good mobility and has been able to bathe, dress, and complete all ADLs. Patient reports her chief complaint of R knee pain with stiffness. Patient reports she feels her patella popping with every step of walking, with bending/stooping, lifting, and most movement of the knee. She reports this popping is painful and the knee feels unsteady. She has started using voltaran and reports this helps pain. Worst R knee pain 6/10 and 0/10 at rest. Patient has stairs to enter her home, she has stairs inside the house that she does not use, but that these are painful and she has to use a step to gait patter. Pt is anticiating a R TKA this fall. Pt denies N/V, B&B changes, unexplained weight fluctuation, saddle paresthesia, fever, night sweats, or unrelenting night pain at this time.    How long can you sit comfortably?  Not limited by RLE, but about 2 hours d/t back pain    How long can you stand comfortably?  unlimited    How long can you walk comfortably?  unlimited    Diagnostic tests  None of R knee    Patient Stated Goals  Strengthen knee so knee cap doesn't wobble    Currently in Pain?  Yes    Pain Score  2     Pain Location  Knee    Pain Orientation  Right    Pain Descriptors / Indicators  Nagging;Aching    Pain Type  Chronic pain    Pain Onset  1 to 4 weeks ago       Ther-Ex Nustep L1 seat setting L9 UE 10 32mins, attempted without UE, too difficult for LUE push without, cuing for alignment with  good carry over - Standing mini squat at treadmill bar with GTB 2x 12 heavy cuing for proper form with glute activation with good carry over Hip abduction with GTB 2x10 with UE support bilaterally - Sidestepping with GTB 3c 74ftR/10ftL x3 alpswith cuing to prevent R hip rotation with R step and cuing for "step" opposed to slide - Hip hike bilaterally  2x10 with demo  and min cuing for proper technique with good carry over following - Lateral step down 3x 10  from foam pad with difficulty with glute max activation, that improves throughout set -standing quad stetch 3x30sec bilaterally with cues for pelvic alignment  NMR: -standing on foam feet together eyes open 30sec, eyes closed 30secs, ball up/down, ball L/R x10 ea (2 rounds) CGA-minA for safety   Pt response/clinical impression: Pt reported increased R knee pain with mini squats that resolved with rest. Able to complete remainder of exercises without increased pain, pt did report fatigue towards end of session but overall performed all planned exercises. The patient does need multimodal cueing to maintain exercise form/technique to maximize proper muscle activation. The patient would benefit from further skilled PT intervention to continue to progress towards goals. Per pt, pt stated in the past her knee feels better with a medial glide of the patella that a PT has performed in the past.    PT Education - 10/09/19 1520    Education Details  therex form, posture    Person(s) Educated  Patient    Methods  Explanation;Demonstration;Tactile cues;Verbal cues    Comprehension  Verbalized understanding;Returned demonstration;Verbal cues required;Tactile cues required;Need further instruction       PT Short Term Goals - 10/04/19 1617      PT SHORT TERM GOAL #1   Title  Pt will be independent with HEP in order to improve strength and decrease back pain in order to improve pain-free function at home and work.    Baseline  10/03/19 HEP given    Time   4    Period  Weeks    Status  New        PT Long Term Goals - 10/04/19 1617      PT LONG TERM GOAL #1   Title  Pt will increase 10MWT by at least 0.13 m/s in order to demonstrate clinically significant improvement in community ambulation.    Baseline  10/03/19 0.27m/s    Time  8    Period  Weeks    Status  New      PT LONG TERM GOAL #2   Title  Pt will decrease 5TSTS by at least 3 seconds in order to demonstrate clinically significant improvement in LE strength    Baseline  10/03/19 15sec    Time  8    Period  Weeks    Status  New      PT LONG TERM GOAL #3   Title  Patinet will demonstrate reciprocal stair neogiation with unilateral handrail, 1 step/second, to demonstrate PLOF and safety negotiating home    Baseline  10/03/19 Step to leading with LLE ascent (0.91steps/sec); Step to leading with RLE descent (0.8steps/sec)    Time  8    Period  Weeks    Status  New      PT LONG TERM GOAL #4   Title  Patient will increase FOTO score to 65 to demonstrate predicted increase in functional mobility to complete ADLs    Baseline  10/03/19 55    Time  8    Period  Weeks    Status  New            Plan - 10/09/19 1520    Clinical Impression Statement  Pt reported increased R knee pain with mini squats that resolved with rest. Able to complete remainder of exercises without increased pain, pt did report fatigue towards end of session but overall performed all planned  exercises. The patient does need multimodal cueing to maintain exercise form/technique to maximize proper muscle activation. The patient would benefit from further skilled PT intervention to continue to progress towards goals. Per pt, pt stated in the past her knee feels better with a medial glide of the patella that a PT has performed in the past.    Personal Factors and Comorbidities  Age;Behavior Pattern;Fitness;Past/Current Experience;Comorbidity 1;Comorbidity 2;Time since onset of injury/illness/exacerbation;Sex     Examination-Activity Limitations  Lift;Stand;Transfers;Carry;Sit;Squat;Stairs    Examination-Participation Restrictions  Community Activity;Meal Prep;Yard Work;Cleaning;Shop    Stability/Clinical Decision Making  Evolving/Moderate complexity    Rehab Potential  Good    PT Frequency  2x / week    PT Duration  8 weeks    PT Treatment/Interventions  ADLs/Self Care Home Management;Electrical Stimulation;Therapeutic activities;Patient/family education;Joint Manipulations;Spinal Manipulations;Passive range of motion;Dry needling;Manual techniques;Taping;Therapeutic exercise;Iontophoresis 4mg /ml Dexamethasone;Gait training;Stair training;Neuromuscular re-education;Functional mobility training;Ultrasound;Cryotherapy;Traction;Moist Heat    PT Next Visit Plan  motor control, glute activation, balance?    PT Home Exercise Plan  mini squat with band, bridge with band    Consulted and Agree with Plan of Care  Patient       Patient will benefit from skilled therapeutic intervention in order to improve the following deficits and impairments:  Pain, Improper body mechanics, Increased fascial restricitons, Abnormal gait, Decreased coordination, Decreased mobility, Postural dysfunction, Impaired flexibility, Difficulty walking, Decreased balance, Decreased activity tolerance, Decreased endurance, Decreased range of motion, Decreased strength  Visit Diagnosis: Chronic pain of left knee  Chronic pain of right knee  Muscle weakness (generalized)  Other abnormalities of gait and mobility  Difficulty in walking, not elsewhere classified     Problem List Patient Active Problem List   Diagnosis Date Noted  . History of revision of total replacement of right hip joint 08/15/2019  . Primary localized osteoarthritis of right hip 04/18/2019  . Primary osteoarthritis of right hip 04/18/2019  . Pectus excavatum 10/12/2017  . 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 Perimenopausal atrophic vaginitis 09/22/2016  .  Menopausal hot flushes 09/22/2016  . Mitral valve prolapse 01/13/2016  . Herniation of nucleus pulposus 05/01/2015  . Hypercholesteremia 05/01/2015  . Osteoarthritis of both knees 05/01/2015  . History of migraine headaches 05/01/2015  . Esophageal reflux 01/10/2015  . S/P total hip arthroplasty 09/12/2013    Lieutenant Diego PT, DPT 11:58 AM,10/10/19   [facility] [address] [place], [address] Phone: [phone]   Fax:  [phone]  Name: Wendy Patton MRN: [id number] Date of Birth: 1936-06-19

## 2019-10-11 ENCOUNTER — Encounter: Payer: Self-pay | Admitting: Physical Therapy

## 2019-10-11 ENCOUNTER — Ambulatory Visit: Payer: PPO | Admitting: Physical Therapy

## 2019-10-11 ENCOUNTER — Other Ambulatory Visit: Payer: Self-pay

## 2019-10-11 DIAGNOSIS — G8929 Other chronic pain: Secondary | ICD-10-CM

## 2019-10-11 DIAGNOSIS — M25562 Pain in left knee: Secondary | ICD-10-CM | POA: Diagnosis not present

## 2019-10-11 DIAGNOSIS — M6281 Muscle weakness (generalized): Secondary | ICD-10-CM

## 2019-10-11 NOTE — Therapy (Signed)
[facility] [address], [place], [address] Phone: [phone]   Fax:  [phone]  Physical Therapy Treatment  Patient Details  Name: Wendy Patton MRN: [id number] Date of Birth: Jun 11, 1936 Referring Provider (PT): Melrose Nakayama   Encounter Date: 10/11/2019  PT End of Session - 10/11/19 0951    Visit Number  4    Number of Visits  17    Date for PT Re-Evaluation  11/28/19    PT Start Time  0948    PT Stop Time  1026    PT Time Calculation (min)  38 min    Activity Tolerance  Patient tolerated treatment well    Behavior During Therapy  [facility] for tasks assessed/performed       Past Medical History:  Diagnosis Date  . Anemia    after jaw surgery was on iron for short period of time  . Arthritis   . Cancer (HCC)    squamous cell  . Difficult intubation    potential for difficult airway due to limited mouth opening  . Dry skin   . GERD (gastroesophageal reflux disease)    takes Aciphex daily  . Headache(784.0)    hx of migraines   . Heart murmur   . History of migraine    last one Aug 2014 and takes Imitrex daily as needed  . History of staph infection   . IBS (irritable bowel syndrome)   . Joint pain   . Joint swelling   . Laryngopharyngeal reflux   . MVP (mitral valve prolapse)   . Neuralgia   . Neuropathy    takes gabapentin daily  . PONV (postoperative nausea and vomiting)    TMJ surgery several times  . Rheumatic fever    as a child  . Shortness of breath    with exertion  . Thyroid disease     Past Surgical History:  Procedure Laterality Date  . ABDOMINAL HYSTERECTOMY  2009  . BREAST DUCTAL SYSTEM EXCISION Right 01/11/2014   Procedure: MAJOR DUCT EXCISION RIGHT BREAST;  Surgeon: Stark Klein, MD;  Location: [facility];  Service: General;  Laterality: Right;  . BREAST EXCISIONAL BIOPSY Right 2015   neg  . BREAST EXCISIONAL BIOPSY Left 2010   benign papilloma removed  . BREAST  EXCISIONAL BIOPSY Left 1975   neg  . BREAST SURGERY  1975, 2010   benign breast tumor  . breat excision  1975   benign breast tumor excision  . CATARACT EXTRACTION W/PHACO Right 07/15/2016   Procedure: CATARACT EXTRACTION PHACO AND INTRAOCULAR LENS PLACEMENT (IOC);  Surgeon: Estill Cotta, MD;  Location: [facility];  Service: Ophthalmology;  Laterality: Right;  Lot # [id number] H [place]: 01:38.6 AP%: 25.0 CDE: 46.89  . CATARACT EXTRACTION W/PHACO Left 09/02/2016   Procedure: CATARACT EXTRACTION PHACO AND INTRAOCULAR LENS PLACEMENT (IOC);  Surgeon: Estill Cotta, MD;  Location: [facility];  Service: Ophthalmology;  Laterality: Left;  [place] 2:05.3 AP% 24.9 CDE 51.84 Fluid pack lot # [id number]  . cervial polyps  1999  . [place]  . COLONOSCOPY    . CYSTECTOMY  1962   vaginal cyst removal  . DILATION AND CURETTAGE OF UTERUS  1984  . ESOPHAGOGASTRODUODENOSCOPY    . granuloma  2002   chin granuloma removed  . [place], 2002, 2004   prosthesis, condyles implants, fossa replaced  . SIGMOIDOSCOPY  1993  . SQUAMOUS CELL CARCINOMA EXCISION  2008   left ankle/skin graft  . [place]  . THYROIDECTOMY  2006   left  . TONSILLECTOMY AND ADENOIDECTOMY  1944  . TOTAL HIP ARTHROPLASTY Left 09/12/2013   Procedure: LEFT TOTAL HIP ARTHROPLASTY ANTERIOR APPROACH;  Surgeon: [name], MD;  Location: [facility];  Service: Orthopedics;  Laterality: Left;  . TOTAL HIP ARTHROPLASTY Right 04/18/2019   Procedure: RIGHT TOTAL HIP ARTHROPLASTY ANTERIOR APPROACH;  Surgeon: Melrose Nakayama, MD;  [facility];  Service: Orthopedics;  Laterality: Right;  . TOTAL HIP ARTHROPLASTY Right 08/15/2019   Procedure: TOTAL HIP ARTHROPLASTY ANTERIOR APPROACH;  Surgeon: Melrose Nakayama, MD;  [facility];  Service: Orthopedics;  Laterality: Right;  . TRAPEZIUM RESECTION  2000, 2004   removal right & left hand trapezium  . TUBAL LIGATION     . uterine polyps  2008  . vaginal cyst removed  1962    There were no vitals filed for this visit.  Subjective Assessment - 10/11/19 0949    Subjective  Patient reports no R knee pain today, that her HEP is going well, but she does still have some pain with mini squats.    Pertinent History  Pt is a 84 year old female presenting s/p R THA in September 2020 with revision 08/15/19. She had HHPT for 3 weeks and has weaned off AD completely, is completing all errands and community ambulation, and driving. She reports she has good mobility and has been able to bathe, dress, and complete all ADLs. Patient reports her chief complaint of R knee pain with stiffness. Patient reports she feels her patella popping with every step of walking, with bending/stooping, lifting, and most movement of the knee. She reports this popping is painful and the knee feels unsteady. She has started using voltaran and reports this helps pain. Worst R knee pain 6/10 and 0/10 at rest. Patient has stairs to enter her home, she has stairs inside the house that she does not use, but that these are painful and she has to use a step to gait patter. Pt is anticiating a R TKA this fall. Pt denies N/V, B&B changes, unexplained weight fluctuation, saddle paresthesia, fever, night sweats, or unrelenting night pain at this time.    How long can you sit comfortably?  Not limited by RLE, but about 2 hours d/t back pain    How long can you stand comfortably?  unlimited    How long can you walk comfortably?  unlimited    Diagnostic tests  None of R knee    Patient Stated Goals  Strengthen knee so knee cap doesn't wobble    Pain Onset  1 to 4 weeks ago         Ther-Ex Nustep L2 seat setting L9 UE 10 64mins, attempted without UE, too difficult for LUE push without, cuing for alignment with good carry over - Mini squat with TRX 3x 10 with minimal pain with this as opposed to mini squat at counter; good BLE alignment, min cuing for equal wt  bearing - OMEGA leg press 10# 3x 10 with visualization of knee valgus with ext which patient is able to somewhat correct - Attempted step up onto 6in and 4in step, unable d/t pain - Step up/lower to/from 2in foam with TC to prevent hip rotation with decent success - L reverse lunge to L hip flex 3x 8 with heavy cuing for R knee alignment and proper technique with decent carry over - Monster walks  with GTB 3c 44ftFWD/10ftBWD with cuing for foot clearance without "drag" with good carry over                       PT Education - 10/11/19 0950    Education Details  therex form, posture    Person(s) Educated  Patient    Methods  Explanation;Demonstration;Tactile cues;Verbal cues    Comprehension  Verbalized understanding;Returned demonstration;Verbal cues required;Tactile cues required       PT Short Term Goals - 10/04/19 1617      PT SHORT TERM GOAL #1   Title  Pt will be independent with HEP in order to improve strength and decrease back pain in order to improve pain-free function at home and work.    Baseline  10/03/19 HEP given    Time  4    Period  Weeks    Status  New        PT Long Term Goals - 10/04/19 1617      PT LONG TERM GOAL #1   Title  Pt will increase 10MWT by at least 0.13 m/s in order to demonstrate clinically significant improvement in community ambulation.    Baseline  10/03/19 0.34m/s    Time  8    Period  Weeks    Status  New      PT LONG TERM GOAL #2   Title  Pt will decrease 5TSTS by at least 3 seconds in order to demonstrate clinically significant improvement in LE strength    Baseline  10/03/19 15sec    Time  8    Period  Weeks    Status  New      PT LONG TERM GOAL #3   Title  Patinet will demonstrate reciprocal stair neogiation with unilateral handrail, 1 step/second, to demonstrate PLOF and safety negotiating home    Baseline  10/03/19 Step to leading with LLE ascent (0.91steps/sec); Step to leading with RLE descent (0.8steps/sec)    Time   8    Period  Weeks    Status  New      PT LONG TERM GOAL #4   Title  Patient will increase FOTO score to 65 to demonstrate predicted increase in functional mobility to complete ADLs    Baseline  10/03/19 55    Time  8    Period  Weeks    Status  New            Plan - 10/11/19 [id number]    Clinical Impression Statement  PT continued therex progression for RLE motor control and strength with good success. Pt is able to cmplete therex with compliance of cuing for technique and modification as needed. Patient is motivated throughout session to complete therex. PT will continue progression as able.    Personal Factors and Comorbidities  Age;Behavior Pattern;Fitness;Past/Current Experience;Comorbidity 1;Comorbidity 2;Time since onset of injury/illness/exacerbation;Sex    Examination-Activity Limitations  Lift;Stand;Transfers;Carry;Sit;Squat;Stairs    Examination-Participation Restrictions  Community Activity;Meal Prep;Yard Work;Cleaning;Shop    Stability/Clinical Decision Making  Evolving/Moderate complexity    Clinical Decision Making  Moderate    Rehab Potential  Good    PT Frequency  2x / week    PT Duration  8 weeks    PT Treatment/Interventions  ADLs/Self Care Home Management;Electrical Stimulation;Therapeutic activities;Patient/family education;Joint Manipulations;Spinal Manipulations;Passive range of motion;Dry needling;Manual techniques;Taping;Therapeutic exercise;Iontophoresis 4mg /ml Dexamethasone;Gait training;Stair training;Neuromuscular re-education;Functional mobility training;Ultrasound;Cryotherapy;Traction;Moist Heat    PT Next Visit Plan  motor control, glute activation, balance?    PT Home  Exercise Plan  mini squat with band, bridge with band    Consulted and Agree with Plan of Care  Patient       Patient will benefit from skilled therapeutic intervention in order to improve the following deficits and impairments:  Pain, Improper body mechanics, Increased fascial restricitons,  Abnormal gait, Decreased coordination, Decreased mobility, Postural dysfunction, Impaired flexibility, Difficulty walking, Decreased balance, Decreased activity tolerance, Decreased endurance, Decreased range of motion, Decreased strength  Visit Diagnosis: Chronic pain of left knee  Chronic pain of right knee  Muscle weakness (generalized)     Problem List Patient Active Problem List   Diagnosis Date Noted  . History of revision of total replacement of right hip joint 08/15/2019  . Primary localized osteoarthritis of right hip 04/18/2019  . Primary osteoarthritis of right hip 04/18/2019  . Pectus excavatum 10/12/2017  . 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 Perimenopausal atrophic vaginitis 09/22/2016  . Menopausal hot flushes 09/22/2016  . Mitral valve prolapse 01/13/2016  . Herniation of nucleus pulposus 05/01/2015  . Hypercholesteremia 05/01/2015  . Osteoarthritis of both knees 05/01/2015  . History of migraine headaches 05/01/2015  . Esophageal reflux 01/10/2015  . S/P total hip arthroplasty 09/12/2013   Wendy Patton PT, DPT Wendy Patton 10/11/2019, 10:24 AM  [facility] [address], [place], [address] Phone: [phone]   Fax:  [phone]  Name: Wendy Patton MRN: [id number] Date of Birth: 1935/08/07

## 2019-10-16 ENCOUNTER — Other Ambulatory Visit: Payer: Self-pay

## 2019-10-16 ENCOUNTER — Encounter: Payer: Self-pay | Admitting: Physical Therapy

## 2019-10-16 ENCOUNTER — Ambulatory Visit: Payer: PPO | Admitting: Physical Therapy

## 2019-10-16 DIAGNOSIS — M25561 Pain in right knee: Secondary | ICD-10-CM

## 2019-10-16 DIAGNOSIS — M25562 Pain in left knee: Secondary | ICD-10-CM | POA: Diagnosis not present

## 2019-10-16 DIAGNOSIS — M6281 Muscle weakness (generalized): Secondary | ICD-10-CM

## 2019-10-16 DIAGNOSIS — G8929 Other chronic pain: Secondary | ICD-10-CM

## 2019-10-16 NOTE — Therapy (Signed)
[facility] [address] [place], [address] Phone: [phone]   Fax:  [phone]  Physical Therapy Treatment  Patient Details  Name: Wendy Patton MRN: [id number] Date of Birth: October 17, 1935 No data recorded  Encounter Date: 10/16/2019  PT End of Session - 10/16/19 1123    Visit Number  5    Number of Visits  17    Date for PT Re-Evaluation  11/28/19    PT Start Time  1116    PT Stop Time  1155    PT Time Calculation (min)  39 min    Activity Tolerance  Patient tolerated treatment well    Behavior During Therapy  [facility] for tasks assessed/performed       Past Medical History:  Diagnosis Date  . Anemia    after jaw surgery was on iron for short period of time  . Arthritis   . Cancer (HCC)    squamous cell  . Difficult intubation    potential for difficult airway due to limited mouth opening  . Dry skin   . GERD (gastroesophageal reflux disease)    takes Aciphex daily  . Headache(784.0)    hx of migraines   . Heart murmur   . History of migraine    last one Aug 2014 and takes Imitrex daily as needed  . History of staph infection   . IBS (irritable bowel syndrome)   . Joint pain   . Joint swelling   . Laryngopharyngeal reflux   . MVP (mitral valve prolapse)   . Neuralgia   . Neuropathy    takes gabapentin daily  . PONV (postoperative nausea and vomiting)    TMJ surgery several times  . Rheumatic fever    as a child  . Shortness of breath    with exertion  . Thyroid disease     Past Surgical History:  Procedure Laterality Date  . ABDOMINAL HYSTERECTOMY  2009  . BREAST DUCTAL SYSTEM EXCISION Right 01/11/2014   Procedure: MAJOR DUCT EXCISION RIGHT BREAST;  Surgeon: Stark Klein, MD;  Location: [facility];  Service: General;  Laterality: Right;  . BREAST EXCISIONAL BIOPSY Right 2015   neg  . BREAST EXCISIONAL BIOPSY Left 2010   benign papilloma removed  . BREAST EXCISIONAL BIOPSY Left  1975   neg  . BREAST SURGERY  1975, 2010   benign breast tumor  . breat excision  1975   benign breast tumor excision  . CATARACT EXTRACTION W/PHACO Right 07/15/2016   Procedure: CATARACT EXTRACTION PHACO AND INTRAOCULAR LENS PLACEMENT (IOC);  Surgeon: Estill Cotta, MD;  Location: [facility];  Service: Ophthalmology;  Laterality: Right;  Lot # [id number] H [place]: 01:38.6 AP%: 25.0 CDE: 46.89  . CATARACT EXTRACTION W/PHACO Left 09/02/2016   Procedure: CATARACT EXTRACTION PHACO AND INTRAOCULAR LENS PLACEMENT (IOC);  Surgeon: Estill Cotta, MD;  Location: [facility];  Service: Ophthalmology;  Laterality: Left;  [place] 2:05.3 AP% 24.9 CDE 51.84 Fluid pack lot # [id number] H  . cervial polyps  1999  . [place]  . COLONOSCOPY    . CYSTECTOMY  1962   vaginal cyst removal  . DILATION AND CURETTAGE OF UTERUS  1984  . ESOPHAGOGASTRODUODENOSCOPY    . granuloma  2002   chin granuloma removed  . [place], 2002, 2004   prosthesis, condyles implants, fossa replaced  . SIGMOIDOSCOPY  1993  . SQUAMOUS CELL CARCINOMA EXCISION  2008  left ankle/skin graft  . [place]  . THYROIDECTOMY  2006   left  . TONSILLECTOMY AND ADENOIDECTOMY  1944  . TOTAL HIP ARTHROPLASTY Left 09/12/2013   Procedure: LEFT TOTAL HIP ARTHROPLASTY ANTERIOR APPROACH;  Surgeon: [name], MD;  Location: [facility];  Service: Orthopedics;  Laterality: Left;  . TOTAL HIP ARTHROPLASTY Right 04/18/2019   Procedure: RIGHT TOTAL HIP ARTHROPLASTY ANTERIOR APPROACH;  Surgeon: Melrose Nakayama, MD;  [facility];  Service: Orthopedics;  Laterality: Right;  . TOTAL HIP ARTHROPLASTY Right 08/15/2019   Procedure: TOTAL HIP ARTHROPLASTY ANTERIOR APPROACH;  Surgeon: Melrose Nakayama, MD;  [facility];  Service: Orthopedics;  Laterality: Right;  . TRAPEZIUM RESECTION  2000, 2004   removal right & left hand trapezium  . TUBAL LIGATION    . uterine polyps  2008   . vaginal cyst removed  1962    There were no vitals filed for this visit.  Subjective Assessment - 10/16/19 1119    Subjective  Patient reports no R knee pain today. She reports she is still having some pain with squatting, she reports she does think she is having better foot clearance    Pertinent History  Pt is a 84 year old female presenting s/p R THA in September 2020 with revision 08/15/19. She had HHPT for 3 weeks and has weaned off AD completely, is completing all errands and community ambulation, and driving. She reports she has good mobility and has been able to bathe, dress, and complete all ADLs. Patient reports her chief complaint of R knee pain with stiffness. Patient reports she feels her patella popping with every step of walking, with bending/stooping, lifting, and most movement of the knee. She reports this popping is painful and the knee feels unsteady. She has started using voltaran and reports this helps pain. Worst R knee pain 6/10 and 0/10 at rest. Patient has stairs to enter her home, she has stairs inside the house that she does not use, but that these are painful and she has to use a step to gait patter. Pt is anticiating a R TKA this fall. Pt denies N/V, B&B changes, unexplained weight fluctuation, saddle paresthesia, fever, night sweats, or unrelenting night pain at this time.    How long can you sit comfortably?  Not limited by RLE, but about 2 hours d/t back pain    How long can you stand comfortably?  unlimited    How long can you walk comfortably?  unlimited    Diagnostic tests  None of R knee    Patient Stated Goals  Strengthen knee so knee cap doesn't wobble    Pain Onset  1 to 4 weeks ago       Ther-Ex Nustep L2 seat setting L9 UE 10 76mins, and L3 45mins attempted without UE, too difficult for LUE push without, cuing for alignment with good carry over - Mini squat with TRX 3x 10 with minimal pain with this as opposed to mini squat at counter; good BLE alignment,  min cuing for equal wt bearing - L reverse lunge with RTB for abd resistance on R knee 3x 10 with TC to prevent R knee valgus and R hip/femur IR with good carry over - Step up/lower to/from 2in foam 2x 10 with max cuing for knee as stabilizer with movement coming from hip with good carry over - Step up onto 4in step x10 with good carry over of on foam motor control, with more force,  unilateral UE  - OMEGA leg press 15# 3x 10 good carry over from last session  - SLS on foam 5 trials between 5-8secds; SLS on ground 20sec; 30sec -                        PT Education - 10/16/19 1123    Person(s) Educated  Patient    Methods  Explanation;Demonstration;Tactile cues;Verbal cues    Comprehension  Verbalized understanding;Returned demonstration;Verbal cues required;Tactile cues required       PT Short Term Goals - 10/04/19 1617      PT SHORT TERM GOAL #1   Title  Pt will be independent with HEP in order to improve strength and decrease back pain in order to improve pain-free function at home and work.    Baseline  10/03/19 HEP given    Time  4    Period  Weeks    Status  New        PT Long Term Goals - 10/04/19 1617      PT LONG TERM GOAL #1   Title  Pt will increase 10MWT by at least 0.13 m/s in order to demonstrate clinically significant improvement in community ambulation.    Baseline  10/03/19 0.22m/s    Time  8    Period  Weeks    Status  New      PT LONG TERM GOAL #2   Title  Pt will decrease 5TSTS by at least 3 seconds in order to demonstrate clinically significant improvement in LE strength    Baseline  10/03/19 15sec    Time  8    Period  Weeks    Status  New      PT LONG TERM GOAL #3   Title  Patinet will demonstrate reciprocal stair neogiation with unilateral handrail, 1 step/second, to demonstrate PLOF and safety negotiating home    Baseline  10/03/19 Step to leading with LLE ascent (0.91steps/sec); Step to leading with RLE descent (0.8steps/sec)    Time   8    Period  Weeks    Status  New      PT LONG TERM GOAL #4   Title  Patient will increase FOTO score to 65 to demonstrate predicted increase in functional mobility to complete ADLs    Baseline  10/03/19 55    Time  8    Period  Weeks    Status  New            Plan - 10/16/19 1127    Clinical Impression Statement  PT continued therex progression for RLE alignment and strengthening with good success. Pt needs cuing for technique of motor control for funtional movements with decent carry over. Patient is increasing functional capacity and is ble to complete small step up this session that she was unable to complete last session. PT will continue progression as able.    Personal Factors and Comorbidities  Age;Behavior Pattern;Fitness;Past/Current Experience;Comorbidity 1;Comorbidity 2;Time since onset of injury/illness/exacerbation;Sex    Examination-Activity Limitations  Lift;Stand;Transfers;Carry;Sit;Squat;Stairs    Examination-Participation Restrictions  Community Activity;Meal Prep;Yard Work;Cleaning;Shop    Stability/Clinical Decision Making  Evolving/Moderate complexity    Clinical Decision Making  Moderate    Rehab Potential  Good    PT Frequency  2x / week    PT Duration  8 weeks    PT Treatment/Interventions  ADLs/Self Care Home Management;Electrical Stimulation;Therapeutic activities;Patient/family education;Joint Manipulations;Spinal Manipulations;Passive range of motion;Dry needling;Manual techniques;Taping;Therapeutic exercise;Iontophoresis 4mg /ml Dexamethasone;Gait training;Stair training;Neuromuscular re-education;Functional  mobility training;Ultrasound;Cryotherapy;Traction;Moist Heat    PT Next Visit Plan  motor control, glute activation, balance?    PT Home Exercise Plan  mini squat with band, bridge with band    Consulted and Agree with Plan of Care  Patient       Patient will benefit from skilled therapeutic intervention in order to improve the following deficits and  impairments:  Pain, Improper body mechanics, Increased fascial restricitons, Abnormal gait, Decreased coordination, Decreased mobility, Postural dysfunction, Impaired flexibility, Difficulty walking, Decreased balance, Decreased activity tolerance, Decreased endurance, Decreased range of motion, Decreased strength  Visit Diagnosis: Chronic pain of left knee  Chronic pain of right knee  Muscle weakness (generalized)     Problem List Patient Active Problem List   Diagnosis Date Noted  . History of revision of total replacement of right hip joint 08/15/2019  . Primary localized osteoarthritis of right hip 04/18/2019  . Primary osteoarthritis of right hip 04/18/2019  . Pectus excavatum 10/12/2017  . 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 Perimenopausal atrophic vaginitis 09/22/2016  . Menopausal hot flushes 09/22/2016  . Mitral valve prolapse 01/13/2016  . Herniation of nucleus pulposus 05/01/2015  . Hypercholesteremia 05/01/2015  . Osteoarthritis of both knees 05/01/2015  . History of migraine headaches 05/01/2015  . Esophageal reflux 01/10/2015  . S/P total hip arthroplasty 09/12/2013   Shelton Silvas PT, DPT Shelton Silvas 10/16/2019, 2:34 PM  [facility] [address], [place], [address] Phone: [phone]   Fax:  [phone]  Name: Wendy Patton MRN: [id number] Date of Birth: [birth year]

## 2019-10-18 ENCOUNTER — Other Ambulatory Visit: Payer: Self-pay

## 2019-10-18 ENCOUNTER — Encounter: Payer: Self-pay | Admitting: Physical Therapy

## 2019-10-18 ENCOUNTER — Ambulatory Visit: Payer: PPO | Admitting: Physical Therapy

## 2019-10-18 DIAGNOSIS — R262 Difficulty in walking, not elsewhere classified: Secondary | ICD-10-CM

## 2019-10-18 DIAGNOSIS — M25561 Pain in right knee: Secondary | ICD-10-CM

## 2019-10-18 DIAGNOSIS — R2689 Other abnormalities of gait and mobility: Secondary | ICD-10-CM

## 2019-10-18 DIAGNOSIS — M25562 Pain in left knee: Secondary | ICD-10-CM | POA: Diagnosis not present

## 2019-10-18 DIAGNOSIS — G8929 Other chronic pain: Secondary | ICD-10-CM

## 2019-10-18 DIAGNOSIS — M6281 Muscle weakness (generalized): Secondary | ICD-10-CM

## 2019-10-18 NOTE — Therapy (Signed)
[facility] [address], [place], [address] Phone: [phone]   Fax:  [phone]  Physical Therapy Treatment  Patient Details  Name: Wendy Patton MRN: [id number] Date of Birth: [birth year] No data recorded  Encounter Date: 10/18/2019  PT End of Session - 10/18/19 1040    Visit Number  6    Number of Visits  17    Date for PT Re-Evaluation  11/28/19    PT Start Time  1117    PT Stop Time  1155    PT Time Calculation (min)  38 min    Activity Tolerance  Patient tolerated treatment well    Behavior During Therapy  [facility] for tasks assessed/performed       Past Medical History:  Diagnosis Date  . Anemia    after jaw surgery was on iron for short period of time  . Arthritis   . Cancer (HCC)    squamous cell  . Difficult intubation    potential for difficult airway due to limited mouth opening  . Dry skin   . GERD (gastroesophageal reflux disease)    takes Aciphex daily  . Headache(784.0)    hx of migraines   . Heart murmur   . History of migraine    last one Aug 2014 and takes Imitrex daily as needed  . History of staph infection   . IBS (irritable bowel syndrome)   . Joint pain   . Joint swelling   . Laryngopharyngeal reflux   . MVP (mitral valve prolapse)   . Neuralgia   . Neuropathy    takes gabapentin daily  . PONV (postoperative nausea and vomiting)    TMJ surgery several times  . Rheumatic fever    as a child  . Shortness of breath    with exertion  . Thyroid disease     Past Surgical History:  Procedure Laterality Date  . ABDOMINAL HYSTERECTOMY  2009  . BREAST DUCTAL SYSTEM EXCISION Right 01/11/2014   Procedure: MAJOR DUCT EXCISION RIGHT BREAST;  Surgeon: Stark Klein, MD;  Location: [facility];  Service: General;  Laterality: Right;  . BREAST EXCISIONAL BIOPSY Right 2015   neg  . BREAST EXCISIONAL BIOPSY Left 2010   benign papilloma removed  . BREAST EXCISIONAL BIOPSY Left  1975   neg  . BREAST SURGERY  1975, 2010   benign breast tumor  . breat excision  1975   benign breast tumor excision  . CATARACT EXTRACTION W/PHACO Right 07/15/2016   Procedure: CATARACT EXTRACTION PHACO AND INTRAOCULAR LENS PLACEMENT (IOC);  Surgeon: Estill Cotta, MD;  Location: [facility];  Service: Ophthalmology;  Laterality: Right;  Lot # [id number] H [place]: 01:38.6 AP%: 25.0 CDE: 46.89  . CATARACT EXTRACTION W/PHACO Left 09/02/2016   Procedure: CATARACT EXTRACTION PHACO AND INTRAOCULAR LENS PLACEMENT (IOC);  Surgeon: Estill Cotta, MD;  Location: [facility];  Service: Ophthalmology;  Laterality: Left;  [place] 2:05.3 AP% 24.9 CDE 51.84 Fluid pack lot # [id number]  . cervial polyps  1999  . [place]  . COLONOSCOPY    . CYSTECTOMY  1962   vaginal cyst removal  . DILATION AND CURETTAGE OF UTERUS  1984  . ESOPHAGOGASTRODUODENOSCOPY    . granuloma  2002   chin granuloma removed  . [place], 2002, 2004   prosthesis, condyles implants, fossa replaced  . SIGMOIDOSCOPY  1993  . SQUAMOUS CELL CARCINOMA EXCISION  2008  left ankle/skin graft  . [place]  . THYROIDECTOMY  2006   left  . TONSILLECTOMY AND ADENOIDECTOMY  1944  . TOTAL HIP ARTHROPLASTY Left 09/12/2013   Procedure: LEFT TOTAL HIP ARTHROPLASTY ANTERIOR APPROACH;  Surgeon: [name], MD;  Location: [place];  Service: Orthopedics;  Laterality: Left;  . TOTAL HIP ARTHROPLASTY Right 04/18/2019   Procedure: RIGHT TOTAL HIP ARTHROPLASTY ANTERIOR APPROACH;  Surgeon: Melrose Nakayama, MD;  [facility];  Service: Orthopedics;  Laterality: Right;  . TOTAL HIP ARTHROPLASTY Right 08/15/2019   Procedure: TOTAL HIP ARTHROPLASTY ANTERIOR APPROACH;  Surgeon: Melrose Nakayama, MD;  [facility];  Service: Orthopedics;  Laterality: Right;  . TRAPEZIUM RESECTION  2000, 2004   removal right & left hand trapezium  . TUBAL LIGATION    . uterine polyps  2008   . vaginal cyst removed  1962    There were no vitals filed for this visit.  Subjective Assessment - 10/18/19 1038    Subjective  Reports mini squats are still painful but not as bad as it used to be. Reports she is doing well overall, no pain this am    Pertinent History  Pt is a 84 year old female presenting s/p R THA in September 2020 with revision 08/15/19. She had HHPT for 3 weeks and has weaned off AD completely, is completing all errands and community ambulation, and driving. She reports she has good mobility and has been able to bathe, dress, and complete all ADLs. Patient reports her chief complaint of R knee pain with stiffness. Patient reports she feels her patella popping with every step of walking, with bending/stooping, lifting, and most movement of the knee. She reports this popping is painful and the knee feels unsteady. She has started using voltaran and reports this helps pain. Worst R knee pain 6/10 and 0/10 at rest. Patient has stairs to enter her home, she has stairs inside the house that she does not use, but that these are painful and she has to use a step to gait patter. Pt is anticiating a R TKA this fall. Pt denies N/V, B&B changes, unexplained weight fluctuation, saddle paresthesia, fever, night sweats, or unrelenting night pain at this time.    How long can you sit comfortably?  Not limited by RLE, but about 2 hours d/t back pain    How long can you stand comfortably?  unlimited    How long can you walk comfortably?  unlimited    Diagnostic tests  None of R knee    Patient Stated Goals  Strengthen knee so knee cap doesn't wobble    Pain Onset  1 to 4 weeks ago       Ther-Ex Nustep L3seat setting L9 UE 10 30mins without UEs today, min cuing to prevent knee valgus -Mini squat with TRX 3x 10 with minimal pain with this as opposed to mini squat at counter; good BLE alignment, min cuing for equal wt bearing - Step up onto 4in step 2 x10 with good carry over of LE alinement  from previous session light unilateral UE support - Step up onto 6in step with bilat UE support 2x10 with decreased UE support as able - L reverse lunge with GTB on R knee for abd/ER activation 3x 10 with demo and cuing after first set with good carry over - OMEGA leg press 20# 3x 10 good carry over from last session; after first set attempted 10# and subsequently 5# with RLE  only with patient unable to complete, cued patient through letting LLE "help" R and focus on ext with RLE for remaining sets                          PT Education - 10/18/19 1040    Education Details  therex form, posture    Person(s) Educated  Patient    Methods  Explanation;Demonstration;Tactile cues;Verbal cues    Comprehension  Verbalized understanding;Returned demonstration;Verbal cues required;Tactile cues required       PT Short Term Goals - 10/04/19 1617      PT SHORT TERM GOAL #1   Title  Pt will be independent with HEP in order to improve strength and decrease back pain in order to improve pain-free function at home and work.    Baseline  10/03/19 HEP given    Time  4    Period  Weeks    Status  New        PT Long Term Goals - 10/04/19 1617      PT LONG TERM GOAL #1   Title  Pt will increase 10MWT by at least 0.13 m/s in order to demonstrate clinically significant improvement in community ambulation.    Baseline  10/03/19 0.37m/s    Time  8    Period  Weeks    Status  New      PT LONG TERM GOAL #2   Title  Pt will decrease 5TSTS by at least 3 seconds in order to demonstrate clinically significant improvement in LE strength    Baseline  10/03/19 15sec    Time  8    Period  Weeks    Status  New      PT LONG TERM GOAL #3   Title  Patinet will demonstrate reciprocal stair neogiation with unilateral handrail, 1 step/second, to demonstrate PLOF and safety negotiating home    Baseline  10/03/19 Step to leading with LLE ascent (0.91steps/sec); Step to leading with RLE descent  (0.8steps/sec)    Time  8    Period  Weeks    Status  New      PT LONG TERM GOAL #4   Title  Patient will increase FOTO score to 65 to demonstrate predicted increase in functional mobility to complete ADLs    Baseline  10/03/19 55    Time  8    Period  Weeks    Status  New            Plan - 10/18/19 1112    Clinical Impression Statement  PT continued therex progression for motor control and R hip strengthening with good succcess. Patient with better understanding this session on the impact of decreased L hip flexor strength on functional movements requiring contractioni from further than 90d of hip flexion. PT will continue progression as able.    Personal Factors and Comorbidities  Age;Behavior Pattern;Fitness;Past/Current Experience;Comorbidity 1;Comorbidity 2;Time since onset of injury/illness/exacerbation;Sex    Examination-Activity Limitations  Lift;Stand;Transfers;Carry;Sit;Squat;Stairs    Examination-Participation Restrictions  Community Activity;Meal Prep;Yard Work;Cleaning;Shop    Stability/Clinical Decision Making  Evolving/Moderate complexity    Clinical Decision Making  Moderate    Rehab Potential  Good    PT Frequency  2x / week    PT Duration  8 weeks    PT Treatment/Interventions  ADLs/Self Care Home Management;Electrical Stimulation;Therapeutic activities;Patient/family education;Joint Manipulations;Spinal Manipulations;Passive range of motion;Dry needling;Manual techniques;Taping;Therapeutic exercise;Iontophoresis 4mg /ml Dexamethasone;Gait training;Stair training;Neuromuscular re-education;Functional mobility training;Ultrasound;Cryotherapy;Traction;Moist Heat    PT  Next Visit Plan  motor control, glute activation, balance?    PT Home Exercise Plan  mini squat with band, bridge with band    Consulted and Agree with Plan of Care  Patient       Patient will benefit from skilled therapeutic intervention in order to improve the following deficits and impairments:  Pain,  Improper body mechanics, Increased fascial restricitons, Abnormal gait, Decreased coordination, Decreased mobility, Postural dysfunction, Impaired flexibility, Difficulty walking, Decreased balance, Decreased activity tolerance, Decreased endurance, Decreased range of motion, Decreased strength  Visit Diagnosis: Chronic pain of left knee  Chronic pain of right knee  Muscle weakness (generalized)  Other abnormalities of gait and mobility  Difficulty in walking, not elsewhere classified     Problem List Patient Active Problem List   Diagnosis Date Noted  . History of revision of total replacement of right hip joint 08/15/2019  . Primary localized osteoarthritis of right hip 04/18/2019  . Primary osteoarthritis of right hip 04/18/2019  . Pectus excavatum 10/12/2017  . 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 Perimenopausal atrophic vaginitis 09/22/2016  . Menopausal hot flushes 09/22/2016  . Mitral valve prolapse 01/13/2016  . Herniation of nucleus pulposus 05/01/2015  . Hypercholesteremia 05/01/2015  . Osteoarthritis of both knees 05/01/2015  . History of migraine headaches 05/01/2015  . Esophageal reflux 01/10/2015  . S/P total hip arthroplasty 09/12/2013   Shelton Silvas PT, DPT Shelton Silvas 10/18/2019, 11:15 AM  [place] [facility] [address], [place], [address] Phone: [phone]   Fax:  [phone]  Name: RASHETA FARRAH MRN: [id number] Date of Birth: 06/11/1936

## 2019-10-23 ENCOUNTER — Encounter: Payer: Self-pay | Admitting: Physical Therapy

## 2019-10-23 ENCOUNTER — Other Ambulatory Visit: Payer: Self-pay

## 2019-10-23 ENCOUNTER — Ambulatory Visit: Payer: PPO | Admitting: Physical Therapy

## 2019-10-23 DIAGNOSIS — R2689 Other abnormalities of gait and mobility: Secondary | ICD-10-CM

## 2019-10-23 DIAGNOSIS — M25561 Pain in right knee: Secondary | ICD-10-CM

## 2019-10-23 DIAGNOSIS — M25562 Pain in left knee: Secondary | ICD-10-CM | POA: Diagnosis not present

## 2019-10-23 DIAGNOSIS — R262 Difficulty in walking, not elsewhere classified: Secondary | ICD-10-CM

## 2019-10-23 DIAGNOSIS — M6281 Muscle weakness (generalized): Secondary | ICD-10-CM

## 2019-10-23 DIAGNOSIS — G8929 Other chronic pain: Secondary | ICD-10-CM

## 2019-10-23 NOTE — Therapy (Signed)
[place] [facility] [address] [place], [address] Phone: [phone]   Fax:  [phone]  Physical Therapy Treatment  Patient Details  Name: Wendy Patton MRN: [id number] Date of Birth: 1936/02/18 No data recorded  Encounter Date: 10/23/2019  PT End of Session - 10/23/19 1134    Visit Number  7    Number of Visits  17    Date for PT Re-Evaluation  11/28/19    PT Start Time  1120    PT Stop Time  1200    PT Time Calculation (min)  40 min    Activity Tolerance  Patient tolerated treatment well    Behavior During Therapy  [facility] for tasks assessed/performed       Past Medical History:  Diagnosis Date  . Anemia    after jaw surgery was on iron for short period of time  . Arthritis   . Cancer (HCC)    squamous cell  . Difficult intubation    potential for difficult airway due to limited mouth opening  . Dry skin   . GERD (gastroesophageal reflux disease)    takes Aciphex daily  . Headache(784.0)    hx of migraines   . Heart murmur   . History of migraine    last one Aug 2014 and takes Imitrex daily as needed  . History of staph infection   . IBS (irritable bowel syndrome)   . Joint pain   . Joint swelling   . Laryngopharyngeal reflux   . MVP (mitral valve prolapse)   . Neuralgia   . Neuropathy    takes gabapentin daily  . PONV (postoperative nausea and vomiting)    TMJ surgery several times  . Rheumatic fever    as a child  . Shortness of breath    with exertion  . Thyroid disease     Past Surgical History:  Procedure Laterality Date  . ABDOMINAL HYSTERECTOMY  2009  . BREAST DUCTAL SYSTEM EXCISION Right 01/11/2014   Procedure: MAJOR DUCT EXCISION RIGHT BREAST;  Surgeon: Stark Klein, MD;  [facility]: [facility];  Service: General;  Laterality: Right;  . BREAST EXCISIONAL BIOPSY Right 2015   neg  . BREAST EXCISIONAL BIOPSY Left 2010   benign papilloma removed  . BREAST EXCISIONAL BIOPSY Left  1975   neg  . BREAST SURGERY  1975, 2010   benign breast tumor  . breat excision  1975   benign breast tumor excision  . CATARACT EXTRACTION W/PHACO Right 07/15/2016   Procedure: CATARACT EXTRACTION PHACO AND INTRAOCULAR LENS PLACEMENT (IOC);  Surgeon: Estill Cotta, MD;  Location: [facility];  Service: Ophthalmology;  Laterality: Right;  Lot # [id number] H [place]: 01:38.6 AP%: 25.0 CDE: 46.89  . CATARACT EXTRACTION W/PHACO Left 09/02/2016   Procedure: CATARACT EXTRACTION PHACO AND INTRAOCULAR LENS PLACEMENT (IOC);  Surgeon: Estill Cotta, MD;  Location: [facility];  Service: Ophthalmology;  Laterality: Left;  [place] 2:05.3 AP% 24.9 CDE 51.84 Fluid pack lot # [id number]  . cervial polyps  1999  . [place]  . COLONOSCOPY    . CYSTECTOMY  1962   vaginal cyst removal  . DILATION AND CURETTAGE OF UTERUS  1984  . ESOPHAGOGASTRODUODENOSCOPY    . granuloma  2002   chin granuloma removed  . [place], 2002, 2004   prosthesis, condyles implants, fossa replaced  . SIGMOIDOSCOPY  1993  . SQUAMOUS CELL CARCINOMA EXCISION  2008  left ankle/skin graft  . [place]  . THYROIDECTOMY  2006   left  . TONSILLECTOMY AND ADENOIDECTOMY  1944  . TOTAL HIP ARTHROPLASTY Left 09/12/2013   Procedure: LEFT TOTAL HIP ARTHROPLASTY ANTERIOR APPROACH;  Surgeon: [name], MD;  Location: [place];  Service: Orthopedics;  Laterality: Left;  . TOTAL HIP ARTHROPLASTY Right 04/18/2019   Procedure: RIGHT TOTAL HIP ARTHROPLASTY ANTERIOR APPROACH;  Surgeon: Melrose Nakayama, MD;  [facility];  Service: Orthopedics;  Laterality: Right;  . TOTAL HIP ARTHROPLASTY Right 08/15/2019   Procedure: TOTAL HIP ARTHROPLASTY ANTERIOR APPROACH;  Surgeon: Melrose Nakayama, MD;  [facility]: [facility];  Service: Orthopedics;  Laterality: Right;  . TRAPEZIUM RESECTION  2000, 2004   removal right & left hand trapezium  . TUBAL LIGATION    . uterine polyps  2008   . vaginal cyst removed  1962    There were no vitals filed for this visit.  Subjective Assessment - 10/23/19 1125    Subjective  Reports she had some increased pain following practicing stair training. She reports she is having some increased pain today d/t this.    Pertinent History  Pt is a 84 year old female presenting s/p R THA in September 2020 with revision 08/15/19. She had HHPT for 3 weeks and has weaned off AD completely, is completing all errands and community ambulation, and driving. She reports she has good mobility and has been able to bathe, dress, and complete all ADLs. Patient reports her chief complaint of R knee pain with stiffness. Patient reports she feels her patella popping with every step of walking, with bending/stooping, lifting, and most movement of the knee. She reports this popping is painful and the knee feels unsteady. She has started using voltaran and reports this helps pain. Worst R knee pain 6/10 and 0/10 at rest. Patient has stairs to enter her home, she has stairs inside the house that she does not use, but that these are painful and she has to use a step to gait patter. Pt is anticiating a R TKA this fall. Pt denies N/V, B&B changes, unexplained weight fluctuation, saddle paresthesia, fever, night sweats, or unrelenting night pain at this time.    How long can you sit comfortably?  Not limited by RLE, but about 2 hours d/t back pain    How long can you stand comfortably?  unlimited    How long can you walk comfortably?  unlimited    Diagnostic tests  None of R knee    Patient Stated Goals  Strengthen knee so knee cap doesn't wobble    Pain Onset  1 to 4 weeks ago       Ther-Ex Nustep L3seat setting L9 UE 150mins (needed UE d/t increased pain today), min cuing to prevent knee valgus - Hooklying ER RTB x10 GTB 2x 10 with min cuing for eccentric control with good carry over - Prone hip ext 2x 10 bilat with cuing for glute contraction with good carry over -  Seated LAQ able to complete without pain in 0-45d x10; with 2# x10 Attempted SAQ with and without ankle wt too painful initially, decreases and able to complete 15 altogether (first 5 painful) - STS from elevated mat without UE use 2x 12 with demo and heavy cuing for technique, patient is demonstrating gowers with knee ext prior to hip ext and needs heavy cuing to correct this, once corrected reports less pain -Bridge attempt with visible quad spasm and discomfort,  cued patient through [place] hold and completed 2x 10 bridges with glute set prior to lift with better ability to complete this way.                            PT Education - 10/23/19 1127    Education Details  therex form, posture    Person(s) Educated  Patient    Methods  Explanation;Demonstration;Tactile cues;Verbal cues    Comprehension  Verbalized understanding;Returned demonstration;Verbal cues required;Tactile cues required       PT Short Term Goals - 10/04/19 1617      PT SHORT TERM GOAL #1   Title  Pt will be independent with HEP in order to improve strength and decrease back pain in order to improve pain-free function at home and work.    Baseline  10/03/19 HEP given    Time  4    Period  Weeks    Status  New        PT Long Term Goals - 10/04/19 1617      PT LONG TERM GOAL #1   Title  Pt will increase 10MWT by at least 0.13 m/s in order to demonstrate clinically significant improvement in community ambulation.    Baseline  10/03/19 0.31m/s    Time  8    Period  Weeks    Status  New      PT LONG TERM GOAL #2   Title  Pt will decrease 5TSTS by at least 3 seconds in order to demonstrate clinically significant improvement in LE strength    Baseline  10/03/19 15sec    Time  8    Period  Weeks    Status  New      PT LONG TERM GOAL #3   Title  Patinet will demonstrate reciprocal stair neogiation with unilateral handrail, 1 step/second, to demonstrate PLOF and safety negotiating  home    Baseline  10/03/19 Step to leading with LLE ascent (0.91steps/sec); Step to leading with RLE descent (0.8steps/sec)    Time  8    Period  Weeks    Status  New      PT LONG TERM GOAL #4   Title  Patient will increase FOTO score to 65 to demonstrate predicted increase in functional mobility to complete ADLs    Baseline  10/03/19 55    Time  8    Period  Weeks    Status  New            Plan - 10/23/19 1206    Clinical Impression Statement  Session plan modified today d/t patient with increased pain this session. Patient is able to complete therex with heavy cuing needed for technique, and modifications needed. Patient is motivated throughout session to acheive understanding of technique with glute activation and is ultimately able to. PT will progress as able.    Personal Factors and Comorbidities  Age;Behavior Pattern;Fitness;Past/Current Experience;Comorbidity 1;Comorbidity 2;Time since onset of injury/illness/exacerbation;Sex    Examination-Activity Limitations  Lift;Stand;Transfers;Carry;Sit;Squat;Stairs    Examination-Participation Restrictions  Community Activity;Meal Prep;Yard Work;Cleaning;Shop    Stability/Clinical Decision Making  Evolving/Moderate complexity    Clinical Decision Making  Moderate    Rehab Potential  Good    PT Frequency  2x / week    PT Duration  8 weeks    PT Treatment/Interventions  ADLs/Self Care Home Management;Electrical Stimulation;Therapeutic activities;Patient/family education;Joint Manipulations;Spinal Manipulations;Passive range of motion;Dry needling;Manual techniques;Taping;Therapeutic exercise;Iontophoresis 4mg /ml Dexamethasone;Gait training;Stair training;Neuromuscular  re-education;Functional mobility training;Ultrasound;Cryotherapy;Traction;Moist Heat    PT Next Visit Plan  motor control, glute activation, balance?    PT Home Exercise Plan  mini squat with band, bridge with band    Consulted and Agree with Plan of Care  Patient        Patient will benefit from skilled therapeutic intervention in order to improve the following deficits and impairments:  Pain, Improper body mechanics, Increased fascial restricitons, Abnormal gait, Decreased coordination, Decreased mobility, Postural dysfunction, Impaired flexibility, Difficulty walking, Decreased balance, Decreased activity tolerance, Decreased endurance, Decreased range of motion, Decreased strength  Visit Diagnosis: Chronic pain of left knee  Chronic pain of right knee  Muscle weakness (generalized)  Other abnormalities of gait and mobility  Difficulty in walking, not elsewhere classified     Problem List Patient Active Problem List   Diagnosis Date Noted  . History of revision of total replacement of right hip joint 08/15/2019  . Primary localized osteoarthritis of right hip 04/18/2019  . Primary osteoarthritis of right hip 04/18/2019  . Pectus excavatum 10/12/2017  . 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 Perimenopausal atrophic vaginitis 09/22/2016  . Menopausal hot flushes 09/22/2016  . Mitral valve prolapse 01/13/2016  . Herniation of nucleus pulposus 05/01/2015  . Hypercholesteremia 05/01/2015  . Osteoarthritis of both knees 05/01/2015  . History of migraine headaches 05/01/2015  . Esophageal reflux 01/10/2015  . S/P total hip arthroplasty 09/12/2013   Shelton Silvas PT, DPT Shelton Silvas 10/23/2019, 12:11 PM  [place] [address], [place], [address] Phone: [phone]   Fax:  [phone]  Name: SAHEJ HOLLING MRN: [id number] Date of Birth: 1936-07-20

## 2019-10-25 ENCOUNTER — Encounter: Payer: PPO | Admitting: Physical Therapy

## 2019-10-25 DIAGNOSIS — M19012 Primary osteoarthritis, left shoulder: Secondary | ICD-10-CM | POA: Diagnosis not present

## 2019-10-26 ENCOUNTER — Encounter: Payer: Self-pay | Admitting: Physical Therapy

## 2019-10-26 ENCOUNTER — Other Ambulatory Visit: Payer: Self-pay

## 2019-10-26 ENCOUNTER — Ambulatory Visit: Payer: PPO | Admitting: Physical Therapy

## 2019-10-26 DIAGNOSIS — R262 Difficulty in walking, not elsewhere classified: Secondary | ICD-10-CM

## 2019-10-26 DIAGNOSIS — M25562 Pain in left knee: Secondary | ICD-10-CM

## 2019-10-26 DIAGNOSIS — M6281 Muscle weakness (generalized): Secondary | ICD-10-CM

## 2019-10-26 DIAGNOSIS — M25561 Pain in right knee: Secondary | ICD-10-CM

## 2019-10-26 DIAGNOSIS — G8929 Other chronic pain: Secondary | ICD-10-CM

## 2019-10-26 DIAGNOSIS — R2689 Other abnormalities of gait and mobility: Secondary | ICD-10-CM

## 2019-10-26 NOTE — Therapy (Signed)
[place] [address], [place], [address] Phone: [phone]   Fax:  [phone]  Physical Therapy Treatment  Patient Details  Name: Wendy Patton MRN: [id number] Date of Birth: 20-Mar-1936 No data recorded  Encounter Date: 10/26/2019  PT End of Session - 10/26/19 1039    Visit Number  8    Number of Visits  17    Date for PT Re-Evaluation  11/28/19    PT Start Time  1033    PT Stop Time  1111    PT Time Calculation (min)  38 min    Activity Tolerance  Patient tolerated treatment well    Behavior During Therapy  [facility] for tasks assessed/performed       Past Medical History:  Diagnosis Date  . Anemia    after jaw surgery was on iron for short period of time  . Arthritis   . Cancer (HCC)    squamous cell  . Difficult intubation    potential for difficult airway due to limited mouth opening  . Dry skin   . GERD (gastroesophageal reflux disease)    takes Aciphex daily  . Headache(784.0)    hx of migraines   . Heart murmur   . History of migraine    last one Aug 2014 and takes Imitrex daily as needed  . History of staph infection   . IBS (irritable bowel syndrome)   . Joint pain   . Joint swelling   . Laryngopharyngeal reflux   . MVP (mitral valve prolapse)   . Neuralgia   . Neuropathy    takes gabapentin daily  . PONV (postoperative nausea and vomiting)    TMJ surgery several times  . Rheumatic fever    as a child  . Shortness of breath    with exertion  . Thyroid disease     Past Surgical History:  Procedure Laterality Date  . ABDOMINAL HYSTERECTOMY  2009  . BREAST DUCTAL SYSTEM EXCISION Right 01/11/2014   Procedure: MAJOR DUCT EXCISION RIGHT BREAST;  Surgeon: Stark Klein, MD;  Location: [facility];  Service: General;  Laterality: Right;  . BREAST EXCISIONAL BIOPSY Right 2015   neg  . BREAST EXCISIONAL BIOPSY Left 2010   benign papilloma removed  . BREAST EXCISIONAL BIOPSY Left  1975   neg  . BREAST SURGERY  1975, 2010   benign breast tumor  . breat excision  1975   benign breast tumor excision  . CATARACT EXTRACTION W/PHACO Right 07/15/2016   Procedure: CATARACT EXTRACTION PHACO AND INTRAOCULAR LENS PLACEMENT (IOC);  Surgeon: Estill Cotta, MD;  Location: [facility];  Service: Ophthalmology;  Laterality: Right;  Lot # [id number] H [place]: 01:38.6 AP%: 25.0 CDE: 46.89  . CATARACT EXTRACTION W/PHACO Left 09/02/2016   Procedure: CATARACT EXTRACTION PHACO AND INTRAOCULAR LENS PLACEMENT (IOC);  Surgeon: Estill Cotta, MD;  Location: [facility];  Service: Ophthalmology;  Laterality: Left;  [place] 2:05.3 AP% 24.9 CDE 51.84 Fluid pack lot # [id number]  . cervial polyps  1999  . [place]  . COLONOSCOPY    . CYSTECTOMY  1962   vaginal cyst removal  . DILATION AND CURETTAGE OF UTERUS  1984  . ESOPHAGOGASTRODUODENOSCOPY    . granuloma  2002   chin granuloma removed  . [place], 2002, 2004   prosthesis, condyles implants, fossa replaced  . SIGMOIDOSCOPY  1993  . SQUAMOUS CELL CARCINOMA EXCISION  2008  left ankle/skin graft  . [place]  . THYROIDECTOMY  2006   left  . TONSILLECTOMY AND ADENOIDECTOMY  1944  . TOTAL HIP ARTHROPLASTY Left 09/12/2013   Procedure: LEFT TOTAL HIP ARTHROPLASTY ANTERIOR APPROACH;  Surgeon: [name], MD;  Location: [place];  Service: Orthopedics;  Laterality: Left;  . TOTAL HIP ARTHROPLASTY Right 04/18/2019   Procedure: RIGHT TOTAL HIP ARTHROPLASTY ANTERIOR APPROACH;  Surgeon: Melrose Nakayama, MD;  [facility];  Service: Orthopedics;  Laterality: Right;  . TOTAL HIP ARTHROPLASTY Right 08/15/2019   Procedure: TOTAL HIP ARTHROPLASTY ANTERIOR APPROACH;  Surgeon: Melrose Nakayama, MD;  [facility];  Service: Orthopedics;  Laterality: Right;  . TRAPEZIUM RESECTION  2000, 2004   removal right & left hand trapezium  . TUBAL LIGATION    . uterine polyps  2008   . vaginal cyst removed  1962    There were no vitals filed for this visit.  Subjective Assessment - 10/26/19 1037    Subjective  Reports she saw her surgeon yesterday and he told her "if it hurts don't do it". Reports she has been trying to stand up using her hips, which she thinks is helping. Is continuing to have some increased knee pain today. She thinks she is fearful going down stairs    Pertinent History  Pt is a 84 year old female presenting s/p R THA in September 2020 with revision 08/15/19. She had HHPT for 3 weeks and has weaned off AD completely, is completing all errands and community ambulation, and driving. She reports she has good mobility and has been able to bathe, dress, and complete all ADLs. Patient reports her chief complaint of R knee pain with stiffness. Patient reports she feels her patella popping with every step of walking, with bending/stooping, lifting, and most movement of the knee. She reports this popping is painful and the knee feels unsteady. She has started using voltaran and reports this helps pain. Worst R knee pain 6/10 and 0/10 at rest. Patient has stairs to enter her home, she has stairs inside the house that she does not use, but that these are painful and she has to use a step to gait patter. Pt is anticiating a R TKA this fall. Pt denies N/V, B&B changes, unexplained weight fluctuation, saddle paresthesia, fever, night sweats, or unrelenting night pain at this time.    How long can you sit comfortably?  Not limited by RLE, but about 2 hours d/t back pain    How long can you stand comfortably?  unlimited    How long can you walk comfortably?  unlimited    Diagnostic tests  None of R knee    Patient Stated Goals  Strengthen knee so knee cap doesn't wobble    Pain Onset  1 to 4 weeks ago       Ther-Ex Nustep L3seat setting L9 UE 180mins without UEs today, min cuing to prevent knee valgus - Step up onto 4in step x10 with unilateral support; x10 without UE;  x10 without UE and LLE hip flex; 6in step x10 with unilateral support; 6in step x10 without support increased time needed for initiation of each progression, with encouragement needed, ultimately able to complete eat set without pain  -STS without UE from 27in mat table x15; 26in x15 with mutliple attempts at 25in but unable; Patinet needs multiple cues and attempts for stand and has some trouble with controlled descent, but does better at end of reps -  OMEGA leg press 10# x10 bilat; 15# 2x 10; attempted 5# with RLE only, unable                         PT Education - 10/26/19 1039    Education Details  therex form, motor control    Person(s) Educated  Patient    Methods  Explanation;Demonstration;Verbal cues    Comprehension  Verbalized understanding;Returned demonstration;Verbal cues required       PT Short Term Goals - 10/04/19 1617      PT SHORT TERM GOAL #1   Title  Pt will be independent with HEP in order to improve strength and decrease back pain in order to improve pain-free function at home and work.    Baseline  10/03/19 HEP given    Time  4    Period  Weeks    Status  New        PT Long Term Goals - 10/04/19 1617      PT LONG TERM GOAL #1   Title  Pt will increase 10MWT by at least 0.13 m/s in order to demonstrate clinically significant improvement in community ambulation.    Baseline  10/03/19 0.9m/s    Time  8    Period  Weeks    Status  New      PT LONG TERM GOAL #2   Title  Pt will decrease 5TSTS by at least 3 seconds in order to demonstrate clinically significant improvement in LE strength    Baseline  10/03/19 15sec    Time  8    Period  Weeks    Status  New      PT LONG TERM GOAL #3   Title  Patinet will demonstrate reciprocal stair neogiation with unilateral handrail, 1 step/second, to demonstrate PLOF and safety negotiating home    Baseline  10/03/19 Step to leading with LLE ascent (0.91steps/sec); Step to leading with RLE descent  (0.8steps/sec)    Time  8    Period  Weeks    Status  New      PT LONG TERM GOAL #4   Title  Patient will increase FOTO score to 65 to demonstrate predicted increase in functional mobility to complete ADLs    Baseline  10/03/19 55    Time  8    Period  Weeks    Status  New            Plan - 10/26/19 1041    Clinical Impression Statement  PT continued therex progression for motor control and hip strengthening/stability with good success. Patient is able to demonstrate good progress with increase reps and slow progressions without pain. PT educated patient on pain as an experience with past experience and expectations impact on this. PT encouraged patient to continue small progressions at home, like she was able to complete in the clinic to overcome this pain cycle, which patient is very receptive to. PT will continue progression as able.    Personal Factors and Comorbidities  Age;Behavior Pattern;Fitness;Past/Current Experience;Comorbidity 1;Comorbidity 2;Time since onset of injury/illness/exacerbation;Sex    Examination-Activity Limitations  Lift;Stand;Transfers;Carry;Sit;Squat;Stairs    Examination-Participation Restrictions  Church    Stability/Clinical Decision Making  Evolving/Moderate complexity    Clinical Decision Making  Moderate    Rehab Potential  Good    PT Frequency  2x / week    PT Duration  8 weeks    PT Treatment/Interventions  ADLs/Self Care Home Management;Electrical Stimulation;Therapeutic activities;Patient/family education;Joint Manipulations;Spinal  Manipulations;Passive range of motion;Dry needling;Manual techniques;Taping;Therapeutic exercise;Iontophoresis 4mg /ml Dexamethasone;Gait training;Stair training;Neuromuscular re-education;Functional mobility training;Ultrasound;Cryotherapy;Traction;Moist Heat    PT Next Visit Plan  motor control, glute activation, balance?    PT Home Exercise Plan  mini squat with band, bridge with band    Consulted and Agree with Plan of  Care  Patient       Patient will benefit from skilled therapeutic intervention in order to improve the following deficits and impairments:  Pain, Improper body mechanics, Increased fascial restricitons, Abnormal gait, Decreased coordination, Decreased mobility, Postural dysfunction, Impaired flexibility, Difficulty walking, Decreased balance, Decreased activity tolerance, Decreased endurance, Decreased range of motion, Decreased strength  Visit Diagnosis: Chronic pain of left knee  Chronic pain of right knee  Muscle weakness (generalized)  Other abnormalities of gait and mobility  Difficulty in walking, not elsewhere classified     Problem List Patient Active Problem List   Diagnosis Date Noted  . History of revision of total replacement of right hip joint 08/15/2019  . Primary localized osteoarthritis of right hip 04/18/2019  . Primary osteoarthritis of right hip 04/18/2019  . Pectus excavatum 10/12/2017  . 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 Perimenopausal atrophic vaginitis 09/22/2016  . Menopausal hot flushes 09/22/2016  . Mitral valve prolapse 01/13/2016  . Herniation of nucleus pulposus 05/01/2015  . Hypercholesteremia 05/01/2015  . Osteoarthritis of both knees 05/01/2015  . History of migraine headaches 05/01/2015  . Esophageal reflux 01/10/2015  . S/P total hip arthroplasty 09/12/2013   Shelton Silvas PT, DPT Shelton Silvas 10/26/2019, 11:13 AM  [place] [facility] [address], [place], [address] Phone: [phone]   Fax:  [phone]  Name: TYKEISHA HAUSMANN MRN: [id number] Date of Birth: 03-22-1936

## 2019-10-30 ENCOUNTER — Ambulatory Visit: Payer: PPO | Admitting: Physical Therapy

## 2019-10-30 ENCOUNTER — Encounter: Payer: Self-pay | Admitting: Physical Therapy

## 2019-10-30 ENCOUNTER — Other Ambulatory Visit: Payer: Self-pay

## 2019-10-30 DIAGNOSIS — M25562 Pain in left knee: Secondary | ICD-10-CM | POA: Diagnosis not present

## 2019-10-30 DIAGNOSIS — R262 Difficulty in walking, not elsewhere classified: Secondary | ICD-10-CM

## 2019-10-30 DIAGNOSIS — G8929 Other chronic pain: Secondary | ICD-10-CM

## 2019-10-30 DIAGNOSIS — M6281 Muscle weakness (generalized): Secondary | ICD-10-CM

## 2019-10-30 DIAGNOSIS — R2689 Other abnormalities of gait and mobility: Secondary | ICD-10-CM

## 2019-10-30 NOTE — Therapy (Signed)
[place] [facility] [address], [place], [address] Phone: [phone]   Fax:  [phone]  Physical Therapy Treatment  Patient Details  Name: Wendy Patton MRN: [id number] Date of Birth: Sep 22, 1935 No data recorded  Encounter Date: 10/30/2019  PT End of Session - 10/30/19 1126    Visit Number  9    Number of Visits  17    Date for PT Re-Evaluation  11/28/19    PT Start Time  1120    PT Stop Time  1158    PT Time Calculation (min)  38 min    Activity Tolerance  Patient tolerated treatment well    Behavior During Therapy  [facility] for tasks assessed/performed       Past Medical History:  Diagnosis Date  . Anemia    after jaw surgery was on iron for short period of time  . Arthritis   . Cancer (HCC)    squamous cell  . Difficult intubation    potential for difficult airway due to limited mouth opening  . Dry skin   . GERD (gastroesophageal reflux disease)    takes Aciphex daily  . Headache(784.0)    hx of migraines   . Heart murmur   . History of migraine    last one Aug 2014 and takes Imitrex daily as needed  . History of staph infection   . IBS (irritable bowel syndrome)   . Joint pain   . Joint swelling   . Laryngopharyngeal reflux   . MVP (mitral valve prolapse)   . Neuralgia   . Neuropathy    takes gabapentin daily  . PONV (postoperative nausea and vomiting)    TMJ surgery several times  . Rheumatic fever    as a child  . Shortness of breath    with exertion  . Thyroid disease     Past Surgical History:  Procedure Laterality Date  . ABDOMINAL HYSTERECTOMY  2009  . BREAST DUCTAL SYSTEM EXCISION Right 01/11/2014   Procedure: MAJOR DUCT EXCISION RIGHT BREAST;  Surgeon: Stark Klein, MD;  [facility]: [facility];  Service: General;  Laterality: Right;  . BREAST EXCISIONAL BIOPSY Right 2015   neg  . BREAST EXCISIONAL BIOPSY Left 2010   benign papilloma removed  . BREAST EXCISIONAL BIOPSY Left  1975   neg  . BREAST SURGERY  1975, 2010   benign breast tumor  . breat excision  1975   benign breast tumor excision  . CATARACT EXTRACTION W/PHACO Right 07/15/2016   Procedure: CATARACT EXTRACTION PHACO AND INTRAOCULAR LENS PLACEMENT (IOC);  Surgeon: Estill Cotta, MD;  Location: [facility];  Service: Ophthalmology;  Laterality: Right;  Lot # [id number] H [place]: 01:38.6 AP%: 25.0 CDE: 46.89  . CATARACT EXTRACTION W/PHACO Left 09/02/2016   Procedure: CATARACT EXTRACTION PHACO AND INTRAOCULAR LENS PLACEMENT (IOC);  Surgeon: Estill Cotta, MD;  Location: [facility];  Service: Ophthalmology;  Laterality: Left;  [place] 2:05.3 AP% 24.9 CDE 51.84 Fluid pack lot # [id number]  . cervial polyps  1999  . [place]  . COLONOSCOPY    . CYSTECTOMY  1962   vaginal cyst removal  . DILATION AND CURETTAGE OF UTERUS  1984  . ESOPHAGOGASTRODUODENOSCOPY    . granuloma  2002   chin granuloma removed  . [place], 2002, 2004   prosthesis, condyles implants, fossa replaced  . SIGMOIDOSCOPY  1993  . SQUAMOUS CELL CARCINOMA EXCISION  2008  left ankle/skin graft  . [place]  . THYROIDECTOMY  2006   left  . TONSILLECTOMY AND ADENOIDECTOMY  1944  . TOTAL HIP ARTHROPLASTY Left 09/12/2013   Procedure: LEFT TOTAL HIP ARTHROPLASTY ANTERIOR APPROACH;  Surgeon: [name], MD;  Location: [place];  Service: Orthopedics;  Laterality: Left;  . TOTAL HIP ARTHROPLASTY Right 04/18/2019   Procedure: RIGHT TOTAL HIP ARTHROPLASTY ANTERIOR APPROACH;  Surgeon: Melrose Nakayama, MD;  [facility];  Service: Orthopedics;  Laterality: Right;  . TOTAL HIP ARTHROPLASTY Right 08/15/2019   Procedure: TOTAL HIP ARTHROPLASTY ANTERIOR APPROACH;  Surgeon: Melrose Nakayama, MD;  [facility];  Service: Orthopedics;  Laterality: Right;  . TRAPEZIUM RESECTION  2000, 2004   removal right & left hand trapezium  . TUBAL LIGATION    . uterine polyps  2008   . vaginal cyst removed  1962    There were no vitals filed for this visit.  Subjective Assessment - 10/30/19 1123    Subjective  Reports she was able to step on her step without her handrail and without pain this morning, which she is very happy with. Reports no pain this morning and compliance with HEP.    Pertinent History  Pt is a 84 year old female presenting s/p R THA in September 2020 with revision 08/15/19. She had HHPT for 3 weeks and has weaned off AD completely, is completing all errands and community ambulation, and driving. She reports she has good mobility and has been able to bathe, dress, and complete all ADLs. Patient reports her chief complaint of R knee pain with stiffness. Patient reports she feels her patella popping with every step of walking, with bending/stooping, lifting, and most movement of the knee. She reports this popping is painful and the knee feels unsteady. She has started using voltaran and reports this helps pain. Worst R knee pain 6/10 and 0/10 at rest. Patient has stairs to enter her home, she has stairs inside the house that she does not use, but that these are painful and she has to use a step to gait patter. Pt is anticiating a R TKA this fall. Pt denies N/V, B&B changes, unexplained weight fluctuation, saddle paresthesia, fever, night sweats, or unrelenting night pain at this time.    How long can you sit comfortably?  Not limited by RLE, but about 2 hours d/t back pain    How long can you stand comfortably?  unlimited    How long can you walk comfortably?  unlimited    Diagnostic tests  None of R knee    Patient Stated Goals  Strengthen knee so knee cap doesn't wobble    Pain Onset  1 to 4 weeks ago       Ther-Ex Nustep L3seat setting L9 UE 165mins; L5 51mins without UEs today, min cuing to prevent knee valgus - Step up with RLE with unilateral handrail support x4; with very minimal UE support x4; with reciprocal steps x4 with UE handrail; x4 with  minimal handrail Attempted reciprocal lower, unable d/t pain - Forward step down with bilat UE support 2x 15 with heavy cuing for technique with decent carry over -STS without UE 26in x12; 25in x6; 24in x6 with increased control with descent at this height, though it improves throughout reps - Hip flexion from hip at 90d x10 with minimal lift; CGA for safety - Hip flexion with LUE rail support 2x 10 with visual target for ROM past 90d with patient  unable to complete for 10reps, tiring at about rep 5, with continued good attempt - OMEGA leg press15# x10; 20# 2x 10/8 with cuing for LLE to be "helping" RLE with good carry over                        PT Education - 10/30/19 1125    Education Details  therex form, motor control    Person(s) Educated  Patient    Methods  Explanation;Demonstration;Verbal cues    Comprehension  Verbalized understanding;Returned demonstration;Verbal cues required       PT Short Term Goals - 10/04/19 1617      PT SHORT TERM GOAL #1   Title  Pt will be independent with HEP in order to improve strength and decrease back pain in order to improve pain-free function at home and work.    Baseline  10/03/19 HEP given    Time  4    Period  Weeks    Status  New        PT Long Term Goals - 10/04/19 1617      PT LONG TERM GOAL #1   Title  Pt will increase 10MWT by at least 0.13 m/s in order to demonstrate clinically significant improvement in community ambulation.    Baseline  10/03/19 0.68m/s    Time  8    Period  Weeks    Status  New      PT LONG TERM GOAL #2   Title  Pt will decrease 5TSTS by at least 3 seconds in order to demonstrate clinically significant improvement in LE strength    Baseline  10/03/19 15sec    Time  8    Period  Weeks    Status  New      PT LONG TERM GOAL #3   Title  Patinet will demonstrate reciprocal stair neogiation with unilateral handrail, 1 step/second, to demonstrate PLOF and safety negotiating home    Baseline   10/03/19 Step to leading with LLE ascent (0.91steps/sec); Step to leading with RLE descent (0.8steps/sec)    Time  8    Period  Weeks    Status  New      PT LONG TERM GOAL #4   Title  Patient will increase FOTO score to 65 to demonstrate predicted increase in functional mobility to complete ADLs    Baseline  10/03/19 55    Time  8    Period  Weeks    Status  New            Plan - 10/30/19 1146    Clinical Impression Statement  PT continued therex progression for increased motor control and strength with good success. Patient with much more confedience and verbalized understanding of pain science education this session, produing the most improvement between sessions to date. Patient is able to comply with all cuing for technique, and is slowly progressing toward goals with less pain response. PT will continue progression as able.    Personal Factors and Comorbidities  Age;Behavior Pattern;Fitness;Past/Current Experience;Comorbidity 1;Comorbidity 2;Time since onset of injury/illness/exacerbation;Sex    Examination-Activity Limitations  Lift;Stand;Transfers;Carry;Sit;Squat;Stairs    Examination-Participation Restrictions  Church    Stability/Clinical Decision Making  Evolving/Moderate complexity    Clinical Decision Making  Moderate    Rehab Potential  Good    PT Frequency  2x / week    PT Duration  8 weeks    PT Treatment/Interventions  ADLs/Self Care Home Management;Electrical Stimulation;Therapeutic activities;Patient/family education;Joint Manipulations;Spinal  Manipulations;Passive range of motion;Dry needling;Manual techniques;Taping;Therapeutic exercise;Iontophoresis 4mg /ml Dexamethasone;Gait training;Stair training;Neuromuscular re-education;Functional mobility training;Ultrasound;Cryotherapy;Traction;Moist Heat    PT Next Visit Plan  motor control, glute activation, balance?    Consulted and Agree with Plan of Care  Patient       Patient will benefit from skilled therapeutic  intervention in order to improve the following deficits and impairments:  Pain, Improper body mechanics, Increased fascial restricitons, Abnormal gait, Decreased coordination, Decreased mobility, Postural dysfunction, Impaired flexibility, Difficulty walking, Decreased balance, Decreased activity tolerance, Decreased endurance, Decreased range of motion, Decreased strength  Visit Diagnosis: Chronic pain of left knee  Chronic pain of right knee  Muscle weakness (generalized)  Other abnormalities of gait and mobility  Difficulty in walking, not elsewhere classified     Problem List Patient Active Problem List   Diagnosis Date Noted  . History of revision of total replacement of right hip joint 08/15/2019  . Primary localized osteoarthritis of right hip 04/18/2019  . Primary osteoarthritis of right hip 04/18/2019  . Pectus excavatum 10/12/2017  . 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 Perimenopausal atrophic vaginitis 09/22/2016  . Menopausal hot flushes 09/22/2016  . Mitral valve prolapse 01/13/2016  . Herniation of nucleus pulposus 05/01/2015  . Hypercholesteremia 05/01/2015  . Osteoarthritis of both knees 05/01/2015  . History of migraine headaches 05/01/2015  . Esophageal reflux 01/10/2015  . S/P total hip arthroplasty 09/12/2013   Shelton Silvas PT, DPT Shelton Silvas 10/30/2019, 12:00 PM  [place] [facility] [address], [place], [address] Phone: [phone]   Fax:  [phone]  Name: Wendy Patton MRN: [id number] Date of Birth: [birth year]

## 2019-11-01 ENCOUNTER — Encounter: Payer: Self-pay | Admitting: Physical Therapy

## 2019-11-01 ENCOUNTER — Ambulatory Visit: Payer: PPO | Admitting: Physical Therapy

## 2019-11-01 ENCOUNTER — Other Ambulatory Visit: Payer: Self-pay

## 2019-11-01 DIAGNOSIS — M25562 Pain in left knee: Secondary | ICD-10-CM

## 2019-11-01 DIAGNOSIS — R2689 Other abnormalities of gait and mobility: Secondary | ICD-10-CM

## 2019-11-01 DIAGNOSIS — M6281 Muscle weakness (generalized): Secondary | ICD-10-CM

## 2019-11-01 DIAGNOSIS — R262 Difficulty in walking, not elsewhere classified: Secondary | ICD-10-CM

## 2019-11-01 DIAGNOSIS — G8929 Other chronic pain: Secondary | ICD-10-CM

## 2019-11-01 DIAGNOSIS — M25561 Pain in right knee: Secondary | ICD-10-CM

## 2019-11-01 NOTE — Therapy (Signed)
[facility] [address] [place], [address] Phone: [phone]   Fax:  [phone]  Physical Therapy Treatment/Progress Note Reporting Period 10/03/19 - 11/01/19  Patient Details  Name: Wendy Patton MRN: [id number] Date of Birth: 05/06/1936 No data recorded  Encounter Date: 11/01/2019  PT End of Session - 11/01/19 0953    Visit Number  10    Number of Visits  17    Date for PT Re-Evaluation  11/28/19    PT Start Time  0949    PT Stop Time  1028    PT Time Calculation (min)  39 min    Activity Tolerance  Patient tolerated treatment well    Behavior During Therapy  [facility] for tasks assessed/performed       Past Medical History:  Diagnosis Date  . Anemia    after jaw surgery was on iron for short period of time  . Arthritis   . Cancer (HCC)    squamous cell  . Difficult intubation    potential for difficult airway due to limited mouth opening  . Dry skin   . GERD (gastroesophageal reflux disease)    takes Aciphex daily  . Headache(784.0)    hx of migraines   . Heart murmur   . History of migraine    last one Aug 2014 and takes Imitrex daily as needed  . History of staph infection   . IBS (irritable bowel syndrome)   . Joint pain   . Joint swelling   . Laryngopharyngeal reflux   . MVP (mitral valve prolapse)   . Neuralgia   . Neuropathy    takes gabapentin daily  . PONV (postoperative nausea and vomiting)    TMJ surgery several times  . Rheumatic fever    as a child  . Shortness of breath    with exertion  . Thyroid disease     Past Surgical History:  Procedure Laterality Date  . ABDOMINAL HYSTERECTOMY  2009  . BREAST DUCTAL SYSTEM EXCISION Right 01/11/2014   Procedure: MAJOR DUCT EXCISION RIGHT BREAST;  Surgeon: Stark Klein, MD;  Location: [facility];  Service: General;  Laterality: Right;  . BREAST EXCISIONAL BIOPSY Right 2015   neg  . BREAST EXCISIONAL BIOPSY Left 2010   benign  papilloma removed  . BREAST EXCISIONAL BIOPSY Left 1975   neg  . BREAST SURGERY  1975, 2010   benign breast tumor  . breat excision  1975   benign breast tumor excision  . CATARACT EXTRACTION W/PHACO Right 07/15/2016   Procedure: CATARACT EXTRACTION PHACO AND INTRAOCULAR LENS PLACEMENT (IOC);  Surgeon: Estill Cotta, MD;  Location: [facility];  Service: Ophthalmology;  Laterality: Right;  Lot # [id number] H [place]: 01:38.6 AP%: 25.0 CDE: 46.89  . CATARACT EXTRACTION W/PHACO Left 09/02/2016   Procedure: CATARACT EXTRACTION PHACO AND INTRAOCULAR LENS PLACEMENT (IOC);  Surgeon: Estill Cotta, MD;  Location: [facility];  Service: Ophthalmology;  Laterality: Left;  [place] 2:05.3 AP% 24.9 CDE 51.84 Fluid pack lot # [id number]  . cervial polyps  1999  . Person  . COLONOSCOPY    . CYSTECTOMY  1962   vaginal cyst removal  . DILATION AND CURETTAGE OF UTERUS  1984  . ESOPHAGOGASTRODUODENOSCOPY    . granuloma  2002   chin granuloma removed  . [place], 2002, 2004   prosthesis, condyles implants, fossa replaced  . SIGMOIDOSCOPY  1993  . SQUAMOUS  CELL CARCINOMA EXCISION  2008   left ankle/skin graft  . [place]  . THYROIDECTOMY  2006   left  . TONSILLECTOMY AND ADENOIDECTOMY  1944  . TOTAL HIP ARTHROPLASTY Left 09/12/2013   Procedure: LEFT TOTAL HIP ARTHROPLASTY ANTERIOR APPROACH;  Surgeon: [name], MD;  Location: [place] [facility];  Service: Orthopedics;  Laterality: Left;  . TOTAL HIP ARTHROPLASTY Right 04/18/2019   Procedure: RIGHT TOTAL HIP ARTHROPLASTY ANTERIOR APPROACH;  Surgeon: Melrose Nakayama, MD;  Location[facility];  Service: Orthopedics;  Laterality: Right;  . TOTAL HIP ARTHROPLASTY Right 08/15/2019   Procedure: TOTAL HIP ARTHROPLASTY ANTERIOR APPROACH;  Surgeon: Melrose Nakayama, MD;  [facility];  Service: Orthopedics;  Laterality: Right;  . TRAPEZIUM RESECTION  2000, 2004   removal right & left hand  trapezium  . TUBAL LIGATION    . uterine polyps  2008  . vaginal cyst removed  1962    There were no vitals filed for this visit.  Subjective Assessment - 11/01/19 0952    Subjective  Patient reports she is feeling good this am. Has been able to go up 5 stairs with reciprocal pattern, which she is happy with. She is working on her motor control and reports she is not on pain medication, which she is pleased with.    Pertinent History  Pt is a 84 year old female presenting s/p R THA in September 2020 with revision 08/15/19. She had HHPT for 3 weeks and has weaned off AD completely, is completing all errands and community ambulation, and driving. She reports she has good mobility and has been able to bathe, dress, and complete all ADLs. Patient reports her chief complaint of R knee pain with stiffness. Patient reports she feels her patella popping with every step of walking, with bending/stooping, lifting, and most movement of the knee. She reports this popping is painful and the knee feels unsteady. She has started using voltaran and reports this helps pain. Worst R knee pain 6/10 and 0/10 at rest. Patient has stairs to enter her home, she has stairs inside the house that she does not use, but that these are painful and she has to use a step to gait patter. Pt is anticiating a R TKA this fall. Pt denies N/V, B&B changes, unexplained weight fluctuation, saddle paresthesia, fever, night sweats, or unrelenting night pain at this time.    How long can you sit comfortably?  Not limited by RLE, but about 2 hours d/t back pain    How long can you stand comfortably?  unlimited    How long can you walk comfortably?  unlimited    Diagnostic tests  None of R knee    Patient Stated Goals  Strengthen knee so knee cap doesn't wobble    Pain Onset  1 to 4 weeks ago         Ther-Ex Nustep L3seat setting L9 UE 138mns; L4 221ms mostly without UEs - Stair ambulation up/down 4 steps with reciprocal pattern  ascent difficulty with descent - Forward step down with bilat UE support x15 with heavy cuing for technique with decent carry over; Step down from 2in foam (attempted from 3in step unable) with unilateral UE support 2x 15 with cuing to prevent R trunk and hip external rotation compensation with decent carry over by the end of reps -STS without UE 24in 2 x10/8; 23in x6 with 3 reps needed initially with minA for proper technique with good carry over following, better  control of descent, but difficulty with last 3-4in maintaining eccentric control  - Hip flexion with LUE rail support 2x 10 each LE with visual target for ROM past 90d with patient unable to [facility] achieve, but very close, good RLE balance with LLE lifting - OMEGA leg press 3x 10 with cuing for LLE to be "helping" RLE with good carry over; able to complete 1 rep with RLE only for 5#                       PT Education - 11/01/19 0953    Education Details  therex form, motor control    Person(s) Educated  Patient    Methods  Explanation;Demonstration;Verbal cues    Comprehension  Verbalized understanding;Returned demonstration;Verbal cues required       PT Short Term Goals - 11/01/19 1022      PT SHORT TERM GOAL #1   Title  Pt will be independent with HEP in order to improve strength and decrease back pain in order to improve pain-free function at home and work.    Baseline  10/03/19 HEP given    Time  4    Status  Achieved        PT Long Term Goals - 11/01/19 1022      PT LONG TERM GOAL #1   Title  Pt will increase 10MWT to at least 1.2 m/s in order to demonstrate normal  community ambulation to acheive community errancds and decrease fall risk    Baseline  10/03/19 0.19ms 11/01/19 1.137m    Time  8    Period  Weeks    Status  Revised      PT LONG TERM GOAL #2   Title  Pt will decrease 5TSTS by at least 3 seconds in order to demonstrate clinically significant improvement in LE strength    Baseline   10/03/19 15sec 11/01/19 12.6sec    Time  8    Period  Weeks    Status  On-going      PT LONG TERM GOAL #3   Title  Patinet will demonstrate reciprocal stair neogiation with unilateral handrail, 1 step/second, to demonstrate PLOF and safety negotiating home    Baseline  10/03/19 Step to leading with LLE ascent (0.91steps/sec); Step to leading with RLE descent (0.8steps/sec) 11/01/19 Reciprocal ascent 1sec/stair with light UE support, descent step to with decreased speed    Time  8    Period  Weeks    Status  Partially Met      PT LONG TERM GOAL #4   Title  Patient will increase FOTO score to 65 to demonstrate predicted increase in functional mobility to complete ADLs    Baseline  10/03/19 55    Time  8    Period  Weeks    Status  Deferred            Plan - 11/01/19 1028    Clinical Impression Statement  PT reassessed goals today with patient making strong progress toward all goals. Patient has made strongest progress with concentric and power strength demonstrated y better ability to stand without support, and ascend stairs, though she is still unable to stand from normal height chair. Continued deficits in eccentric control, demonstrated by decreased control with sit from stand and inability to complete step down. PT will continue progression able.    Personal Factors and Comorbidities  Age;Behavior Pattern;Fitness;Past/Current Experience;Comorbidity 1;Comorbidity 2;Time since onset of injury/illness/exacerbation;Sex    Examination-Activity Limitations  Lift;Stand;Transfers;Carry;Sit;Squat;Stairs    Examination-Participation Restrictions  Church    Stability/Clinical Decision Making  Evolving/Moderate complexity    Clinical Decision Making  Moderate    Rehab Potential  Good    PT Frequency  2x / week    PT Duration  8 weeks    PT Treatment/Interventions  ADLs/Self Care Home Management;Electrical Stimulation;Therapeutic activities;Patient/family education;Joint Manipulations;Spinal  Manipulations;Passive range of motion;Dry needling;Manual techniques;Taping;Therapeutic exercise;Iontophoresis 33m/ml Dexamethasone;Gait training;Stair training;Neuromuscular re-education;Functional mobility training;Ultrasound;Cryotherapy;Traction;Moist Heat    PT Next Visit Plan  motor control, glute activation, balance?    PT Home Exercise Plan  mini squat with band, bridge with band    Consulted and Agree with Plan of Care  Patient       Patient will benefit from skilled therapeutic intervention in order to improve the following deficits and impairments:  Pain, Improper body mechanics, Increased fascial restricitons, Abnormal gait, Decreased coordination, Decreased mobility, Postural dysfunction, Impaired flexibility, Difficulty walking, Decreased balance, Decreased activity tolerance, Decreased endurance, Decreased range of motion, Decreased strength  Visit Diagnosis: Chronic pain of left knee  Chronic pain of right knee  Muscle weakness (generalized)  Other abnormalities of gait and mobility  Difficulty in walking, not elsewhere classified     Problem List Patient Active Problem List   Diagnosis Date Noted  . History of revision of total replacement of right hip joint 08/15/2019  . Primary localized osteoarthritis of right hip 04/18/2019  . Primary osteoarthritis of right hip 04/18/2019  . Pectus excavatum 10/12/2017  . 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 Perimenopausal atrophic vaginitis 09/22/2016  . Menopausal hot flushes 09/22/2016  . Mitral valve prolapse 01/13/2016  . Herniation of nucleus pulposus 05/01/2015  . Hypercholesteremia 05/01/2015  . Osteoarthritis of both knees 05/01/2015  . History of migraine headaches 05/01/2015  . Esophageal reflux 01/10/2015  . S/P total hip arthroplasty 09/12/2013   CShelton SilvasPT, DPT CShelton Silvas3/31/2021, 11:31 AM  [facility] [address] [place] [address]Phone: [phone]  Fax:  [phone] Name: Wendy STANKOVICHMRN: [id number] of Birth: 9May 03, 1937

## 2019-11-06 ENCOUNTER — Ambulatory Visit: Payer: PPO | Attending: Orthopaedic Surgery | Admitting: Physical Therapy

## 2019-11-06 ENCOUNTER — Encounter: Payer: Self-pay | Admitting: Physical Therapy

## 2019-11-06 ENCOUNTER — Other Ambulatory Visit: Payer: Self-pay

## 2019-11-06 DIAGNOSIS — M25561 Pain in right knee: Secondary | ICD-10-CM | POA: Insufficient documentation

## 2019-11-06 DIAGNOSIS — R2689 Other abnormalities of gait and mobility: Secondary | ICD-10-CM | POA: Diagnosis not present

## 2019-11-06 DIAGNOSIS — R262 Difficulty in walking, not elsewhere classified: Secondary | ICD-10-CM | POA: Insufficient documentation

## 2019-11-06 DIAGNOSIS — M25562 Pain in left knee: Secondary | ICD-10-CM | POA: Diagnosis not present

## 2019-11-06 DIAGNOSIS — G8929 Other chronic pain: Secondary | ICD-10-CM | POA: Insufficient documentation

## 2019-11-06 DIAGNOSIS — M6281 Muscle weakness (generalized): Secondary | ICD-10-CM | POA: Diagnosis not present

## 2019-11-06 NOTE — Therapy (Signed)
[facility] [address] [place], [address] Phone: [phone]   Fax:  [phone]  Physical Therapy Treatment  Patient Details  Name: Wendy Patton MRN: [id number] Date of Birth: [birth year] No data recorded  Encounter Date: 11/06/2019  PT End of Session - 11/06/19 1124    Visit Number  11    Number of Visits  17    Date for PT Re-Evaluation  11/28/19    PT Start Time  1115    PT Stop Time  1155    PT Time Calculation (min)  40 min    Activity Tolerance  Patient tolerated treatment well    Behavior During Therapy  [facility] for tasks assessed/performed       Past Medical History:  Diagnosis Date  . Anemia    after jaw surgery was on iron for short period of time  . Arthritis   . Cancer (HCC)    squamous cell  . Difficult intubation    potential for difficult airway due to limited mouth opening  . Dry skin   . GERD (gastroesophageal reflux disease)    takes Aciphex daily  . Headache(784.0)    hx of migraines   . Heart murmur   . History of migraine    last one Aug 2014 and takes Imitrex daily as needed  . History of staph infection   . IBS (irritable bowel syndrome)   . Joint pain   . Joint swelling   . Laryngopharyngeal reflux   . MVP (mitral valve prolapse)   . Neuralgia   . Neuropathy    takes gabapentin daily  . PONV (postoperative nausea and vomiting)    TMJ surgery several times  . Rheumatic fever    as a child  . Shortness of breath    with exertion  . Thyroid disease     Past Surgical History:  Procedure Laterality Date  . ABDOMINAL HYSTERECTOMY  2009  . BREAST DUCTAL SYSTEM EXCISION Right 01/11/2014   Procedure: MAJOR DUCT EXCISION RIGHT BREAST;  Surgeon: Stark Klein, MD;  Location: [facility];  Service: General;  Laterality: Right;  . BREAST EXCISIONAL BIOPSY Right 2015   neg  . BREAST EXCISIONAL BIOPSY Left 2010   benign papilloma removed  . BREAST EXCISIONAL BIOPSY Left  1975   neg  . BREAST SURGERY  1975, 2010   benign breast tumor  . breat excision  1975   benign breast tumor excision  . CATARACT EXTRACTION W/PHACO Right 07/15/2016   Procedure: CATARACT EXTRACTION PHACO AND INTRAOCULAR LENS PLACEMENT (IOC);  Surgeon: Estill Cotta, MD;  Location: [facility];  Service: Ophthalmology;  Laterality: Right;  Lot # [id number] H [place]: 01:38.6 AP%: 25.0 CDE: 46.89  . CATARACT EXTRACTION W/PHACO Left 09/02/2016   Procedure: CATARACT EXTRACTION PHACO AND INTRAOCULAR LENS PLACEMENT (IOC);  Surgeon: Estill Cotta, MD;  Location: [facility];  Service: Ophthalmology;  Laterality: Left;  [place] 2:05.3 AP% 24.9 CDE 51.84 Fluid pack lot # [id number]  . cervial polyps  1999  . [place]  . COLONOSCOPY    . CYSTECTOMY  1962   vaginal cyst removal  . DILATION AND CURETTAGE OF UTERUS  1984  . ESOPHAGOGASTRODUODENOSCOPY    . granuloma  2002   chin granuloma removed  . [place], 2002, 2004   prosthesis, condyles implants, fossa replaced  . SIGMOIDOSCOPY  1993  . SQUAMOUS CELL CARCINOMA EXCISION  2008  left ankle/skin graft  . [place]  . THYROIDECTOMY  2006   left  . TONSILLECTOMY AND ADENOIDECTOMY  1944  . TOTAL HIP ARTHROPLASTY Left 09/12/2013   Procedure: LEFT TOTAL HIP ARTHROPLASTY ANTERIOR APPROACH;  Surgeon: [name], MD;  Location: [facility];  Service: Orthopedics;  Laterality: Left;  . TOTAL HIP ARTHROPLASTY Right 04/18/2019   Procedure: RIGHT TOTAL HIP ARTHROPLASTY ANTERIOR APPROACH;  Surgeon: Melrose Nakayama, MD;  Location: [facility];  Service: Orthopedics;  Laterality: Right;  . TOTAL HIP ARTHROPLASTY Right 08/15/2019   Procedure: TOTAL HIP ARTHROPLASTY ANTERIOR APPROACH;  Surgeon: Melrose Nakayama, MD;  Location: [facility];  Service: Orthopedics;  Laterality: Right;  . TRAPEZIUM RESECTION  2000, 2004   removal right & left hand trapezium  . TUBAL LIGATION    . uterine polyps  2008   . vaginal cyst removed  1962    There were no vitals filed for this visit.  Subjective Assessment - 11/06/19 1122    Subjective  Reports she increased some resistance in her bridge exercise. Reports she is doing well with stair ascent, and is continuing to practice descent, though it is still difficult    Pertinent History  Pt is a 84 year old female presenting s/p R THA in September 2020 with revision 08/15/19. She had HHPT for 3 weeks and has weaned off AD completely, is completing all errands and community ambulation, and driving. She reports she has good mobility and has been able to bathe, dress, and complete all ADLs. Patient reports her chief complaint of R knee pain with stiffness. Patient reports she feels her patella popping with every step of walking, with bending/stooping, lifting, and most movement of the knee. She reports this popping is painful and the knee feels unsteady. She has started using voltaran and reports this helps pain. Worst R knee pain 6/10 and 0/10 at rest. Patient has stairs to enter her home, she has stairs inside the house that she does not use, but that these are painful and she has to use a step to gait patter. Pt is anticiating a R TKA this fall. Pt denies N/V, B&B changes, unexplained weight fluctuation, saddle paresthesia, fever, night sweats, or unrelenting night pain at this time.    How long can you sit comfortably?  Not limited by RLE, but about 2 hours d/t back pain    How long can you stand comfortably?  unlimited    How long can you walk comfortably?  unlimited    Diagnostic tests  None of R knee    Patient Stated Goals  Strengthen knee so knee cap doesn't wobble    Pain Onset  1 to 4 weeks ago       Ther-Ex Nustep L3seat setting L9 UE 133mns; L4 297ms mostly without UEs - Stair ambulation up/down 4 steps with reciprocal pattern ascent difficulty with descent - Forward step down with unilateral UE support from 3in step with difficulty with  technique and pain, able to complete 12 altogether with difficulty, modified to step down from foam x12; tried again from 3in x12 able with LLE toe touch down -STS without UE 23in x6; 22in 2x 6 with some push from mat at post LEs on mat table at initial reps, UEs needed to control lower from 130-90d to sit, but good overall control with sit - SLR with eccentric lower x10; w/ 2# AW 2x 6 (attempted 3# unable) with cuing for LE alignment with good carry over -  RLE OMEGA leg press 5# 3x 10 with cuing for eccentric control                         PT Education - 11/06/19 1123    Education Details  therex form, motor control    Person(s) Educated  Patient    Methods  Explanation;Demonstration;Verbal cues    Comprehension  Verbalized understanding;Returned demonstration;Verbal cues required       PT Short Term Goals - 11/01/19 1022      PT SHORT TERM GOAL #1   Title  Pt will be independent with HEP in order to improve strength and decrease back pain in order to improve pain-free function at home and work.    Baseline  10/03/19 HEP given    Time  4    Status  Achieved        PT Long Term Goals - 11/01/19 1022      PT LONG TERM GOAL #1   Title  Pt will increase 10MWT to at least 1.2 m/s in order to demonstrate normal  community ambulation to acheive community errancds and decrease fall risk    Baseline  10/03/19 0.11ms 11/01/19 1.137m    Time  8    Period  Weeks    Status  Revised      PT LONG TERM GOAL #2   Title  Pt will decrease 5TSTS by at least 3 seconds in order to demonstrate clinically significant improvement in LE strength    Baseline  10/03/19 15sec 11/01/19 12.6sec    Time  8    Period  Weeks    Status  On-going      PT LONG TERM GOAL #3   Title  Patinet will demonstrate reciprocal stair neogiation with unilateral handrail, 1 step/second, to demonstrate PLOF and safety negotiating home    Baseline  10/03/19 Step to leading with LLE ascent (0.91steps/sec); Step  to leading with RLE descent (0.8steps/sec) 11/01/19 Reciprocal ascent 1sec/stair with light UE support, descent step to with decreased speed    Time  8    Period  Weeks    Status  Partially Met      PT LONG TERM GOAL #4   Title  Patient will increase FOTO score to 65 to demonstrate predicted increase in functional mobility to complete ADLs    Baseline  10/03/19 55    Time  8    Period  Weeks    Status  Deferred            Plan - 11/06/19 1125    Clinical Impression Statement  PT continued therex progression for motor control and hip flexor strengthening (continued eccentric focus) with success. patient is able to complete all therex with proper technique following cuing/demo and is motivated throughout session. Patient making notable improvement this session, especially with RLE only leg press with eccentric ocntrol. PT will continue progression as able.    Personal Factors and Comorbidities  Age;Behavior Pattern;Fitness;Past/Current Experience;Comorbidity 1;Comorbidity 2;Time since onset of injury/illness/exacerbation;Sex    Examination-Activity Limitations  Lift;Stand;Transfers;Carry;Sit;Squat;Stairs    Examination-Participation Restrictions  Church    Stability/Clinical Decision Making  Evolving/Moderate complexity    Clinical Decision Making  Moderate    Rehab Potential  Good    PT Frequency  2x / week    PT Duration  8 weeks    PT Treatment/Interventions  ADLs/Self Care Home Management;Electrical Stimulation;Therapeutic activities;Patient/family education;Joint Manipulations;Spinal Manipulations;Passive range of motion;Dry needling;Manual techniques;Taping;Therapeutic exercise;Iontophoresis 43m5ml Dexamethasone;Gait  training;Stair training;Neuromuscular re-education;Functional mobility training;Ultrasound;Cryotherapy;Traction;Moist Heat    PT Next Visit Plan  motor control, glute activation, balance?    PT Home Exercise Plan  mini squat with band, bridge with band    Consulted and  Agree with Plan of Care  Patient       Patient will benefit from skilled therapeutic intervention in order to improve the following deficits and impairments:  Pain, Improper body mechanics, Increased fascial restricitons, Abnormal gait, Decreased coordination, Decreased mobility, Postural dysfunction, Impaired flexibility, Difficulty walking, Decreased balance, Decreased activity tolerance, Decreased endurance, Decreased range of motion, Decreased strength  Visit Diagnosis: Chronic pain of left knee  Chronic pain of right knee  Muscle weakness (generalized)  Other abnormalities of gait and mobility  Difficulty in walking, not elsewhere classified     Problem List Patient Active Problem List   Diagnosis Date Noted  . History of revision of total replacement of right hip joint 08/15/2019  . Primary localized osteoarthritis of right hip 04/18/2019  . Primary osteoarthritis of right hip 04/18/2019  . Pectus excavatum 10/12/2017  . 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 Perimenopausal atrophic vaginitis 09/22/2016  . Menopausal hot flushes 09/22/2016  . Mitral valve prolapse 01/13/2016  . Herniation of nucleus pulposus 05/01/2015  . Hypercholesteremia 05/01/2015  . Osteoarthritis of both knees 05/01/2015  . History of migraine headaches 05/01/2015  . Esophageal reflux 01/10/2015  . S/P total hip arthroplasty 09/12/2013   Shelton Silvas PT, DPT Shelton Silvas 11/06/2019, 12:43 PM  [facility] [address] [place], [address] Phone: [phone]   Fax:  [phone]  Name: Wendy Patton MRN: [id number] Date of Birth: 1935-11-11

## 2019-11-08 ENCOUNTER — Other Ambulatory Visit: Payer: Self-pay

## 2019-11-08 ENCOUNTER — Encounter: Payer: Self-pay | Admitting: Physical Therapy

## 2019-11-08 ENCOUNTER — Ambulatory Visit: Payer: PPO | Admitting: Physical Therapy

## 2019-11-08 DIAGNOSIS — M25562 Pain in left knee: Secondary | ICD-10-CM | POA: Diagnosis not present

## 2019-11-08 DIAGNOSIS — M6281 Muscle weakness (generalized): Secondary | ICD-10-CM

## 2019-11-08 DIAGNOSIS — G8929 Other chronic pain: Secondary | ICD-10-CM

## 2019-11-08 NOTE — Therapy (Signed)
[facility] [address], [place], [address] Phone: [phone]   Fax:  [phone]  Physical Therapy Treatment  Patient Details  Name: Wendy Patton MRN: [id number] Date of Birth: [birth year] No data recorded  Encounter Date: 11/08/2019  PT End of Session - 11/08/19 1123    Visit Number  12    Number of Visits  17    Date for PT Re-Evaluation  11/28/19    PT Start Time  1115    PT Stop Time  1155    PT Time Calculation (min)  40 min    Activity Tolerance  Patient tolerated treatment well    Behavior During Therapy  [facility] for tasks assessed/performed       Past Medical History:  Diagnosis Date  . Anemia    after jaw surgery was on iron for short period of time  . Arthritis   . Cancer (HCC)    squamous cell  . Difficult intubation    potential for difficult airway due to limited mouth opening  . Dry skin   . GERD (gastroesophageal reflux disease)    takes Aciphex daily  . Headache(784.0)    hx of migraines   . Heart murmur   . History of migraine    last one Aug 2014 and takes Imitrex daily as needed  . History of staph infection   . IBS (irritable bowel syndrome)   . Joint pain   . Joint swelling   . Laryngopharyngeal reflux   . MVP (mitral valve prolapse)   . Neuralgia   . Neuropathy    takes gabapentin daily  . PONV (postoperative nausea and vomiting)    TMJ surgery several times  . Rheumatic fever    as a child  . Shortness of breath    with exertion  . Thyroid disease     Past Surgical History:  Procedure Laterality Date  . ABDOMINAL HYSTERECTOMY  2009  . BREAST DUCTAL SYSTEM EXCISION Right 01/11/2014   Procedure: MAJOR DUCT EXCISION RIGHT BREAST;  Surgeon: Stark Klein, MD;  Location: [facility];  Service: General;  Laterality: Right;  . BREAST EXCISIONAL BIOPSY Right 2015   neg  . BREAST EXCISIONAL BIOPSY Left 2010   benign papilloma removed  . BREAST EXCISIONAL BIOPSY Left  1975   neg  . BREAST SURGERY  1975, 2010   benign breast tumor  . breat excision  1975   benign breast tumor excision  . CATARACT EXTRACTION W/PHACO Right 07/15/2016   Procedure: CATARACT EXTRACTION PHACO AND INTRAOCULAR LENS PLACEMENT (IOC);  Surgeon: Estill Cotta, MD;  Location: [facility];  Service: Ophthalmology;  Laterality: Right;  Lot # [id number] H [place]: 01:38.6 AP%: 25.0 CDE: 46.89  . CATARACT EXTRACTION W/PHACO Left 09/02/2016   Procedure: CATARACT EXTRACTION PHACO AND INTRAOCULAR LENS PLACEMENT (IOC);  Surgeon: Estill Cotta, MD;  Location: [facility];  Service: Ophthalmology;  Laterality: Left;  [place] 2:05.3 AP% 24.9 CDE 51.84 Fluid pack lot # [id number]  . cervial polyps  1999  . [place]  . COLONOSCOPY    . CYSTECTOMY  1962   vaginal cyst removal  . DILATION AND CURETTAGE OF UTERUS  1984  . ESOPHAGOGASTRODUODENOSCOPY    . granuloma  2002   chin granuloma removed  . [place], 2002, 2004   prosthesis, condyles implants, fossa replaced  . SIGMOIDOSCOPY  1993  . SQUAMOUS CELL CARCINOMA EXCISION  2008  left ankle/skin graft  . [place]  . THYROIDECTOMY  2006   left  . TONSILLECTOMY AND ADENOIDECTOMY  1944  . TOTAL HIP ARTHROPLASTY Left 09/12/2013   Procedure: LEFT TOTAL HIP ARTHROPLASTY ANTERIOR APPROACH;  Surgeon: [name], MD;  Location: [facility];  Service: Orthopedics;  Laterality: Left;  . TOTAL HIP ARTHROPLASTY Right 04/18/2019   Procedure: RIGHT TOTAL HIP ARTHROPLASTY ANTERIOR APPROACH;  Surgeon: Melrose Nakayama, MD;  [facility];  Service: Orthopedics;  Laterality: Right;  . TOTAL HIP ARTHROPLASTY Right 08/15/2019   Procedure: TOTAL HIP ARTHROPLASTY ANTERIOR APPROACH;  Surgeon: Melrose Nakayama, MD;  [facility];  Service: Orthopedics;  Laterality: Right;  . TRAPEZIUM RESECTION  2000, 2004   removal right & left hand trapezium  . TUBAL LIGATION    . uterine polyps  2008   . vaginal cyst removed  1962    There were no vitals filed for this visit.  Subjective Assessment - 11/08/19 1121    Subjective  Patient is pleased to report she went down 2 steps without UE ssupport without pain. No pain to date. compliance with HEP    Pertinent History  Pt is a 84 year old female presenting s/p R THA in September 2020 with revision 08/15/19. She had HHPT for 3 weeks and has weaned off AD completely, is completing all errands and community ambulation, and driving. She reports she has good mobility and has been able to bathe, dress, and complete all ADLs. Patient reports her chief complaint of R knee pain with stiffness. Patient reports she feels her patella popping with every step of walking, with bending/stooping, lifting, and most movement of the knee. She reports this popping is painful and the knee feels unsteady. She has started using voltaran and reports this helps pain. Worst R knee pain 6/10 and 0/10 at rest. Patient has stairs to enter her home, she has stairs inside the house that she does not use, but that these are painful and she has to use a step to gait patter. Pt is anticiating a R TKA this fall. Pt denies N/V, B&B changes, unexplained weight fluctuation, saddle paresthesia, fever, night sweats, or unrelenting night pain at this time.    How long can you sit comfortably?  Not limited by RLE, but about 2 hours d/t back pain    How long can you stand comfortably?  unlimited    How long can you walk comfortably?  unlimited    Diagnostic tests  None of R knee    Patient Stated Goals  Strengthen knee so knee cap doesn't wobble    Pain Onset  1 to 4 weeks ago         Ther-Ex Nustep L3seat setting L9 UE 10 L4 20mns mostly without UEs -Stair ambulation up/down 4 steps with reciprocal pattern ascent and descent, with min UE support with descent, normalized speed, patient very pleased with progress, increased lateral lateral displacement - Forward step down from 6in  step about 50% 2x 12 with bilat UE support with focus on technique/eccentric control  - Complete step down from 6in step with RLE heel raise with LLE heel to forefoot rock x12 with good carry over of demo, pain with initial reps that subsides around rep 5 -STS without UE 22in 2x 6; 21in x5 with some push from mat at post LEs on mat table at initial reps, UEs needed to control lower from 130-90d to sit, but good overall control with sit -  SLR with eccentric lower x10; w/ 2# AW x10 3# AW 2x 9/8 with cuing for eccentric lower with good carry over - RLE OMEGA leg press5# 3x 10 with cuing for eccentric control                        PT Education - 11/08/19 1122    Education Details  therex form, motor control    Person(s) Educated  Patient    Methods  Explanation;Demonstration;Verbal cues    Comprehension  Verbalized understanding;Returned demonstration;Verbal cues required       PT Short Term Goals - 11/01/19 1022      PT SHORT TERM GOAL #1   Title  Pt will be independent with HEP in order to improve strength and decrease back pain in order to improve pain-free function at home and work.    Baseline  10/03/19 HEP given    Time  4    Status  Achieved        PT Long Term Goals - 11/01/19 1022      PT LONG TERM GOAL #1   Title  Pt will increase 10MWT to at least 1.2 m/s in order to demonstrate normal  community ambulation to acheive community errancds and decrease fall risk    Baseline  10/03/19 0.35ms 11/01/19 1.14m    Time  8    Period  Weeks    Status  Revised      PT LONG TERM GOAL #2   Title  Pt will decrease 5TSTS by at least 3 seconds in order to demonstrate clinically significant improvement in LE strength    Baseline  10/03/19 15sec 11/01/19 12.6sec    Time  8    Period  Weeks    Status  On-going      PT LONG TERM GOAL #3   Title  Patinet will demonstrate reciprocal stair neogiation with unilateral handrail, 1 step/second, to demonstrate PLOF and safety  negotiating home    Baseline  10/03/19 Step to leading with LLE ascent (0.91steps/sec); Step to leading with RLE descent (0.8steps/sec) 11/01/19 Reciprocal ascent 1sec/stair with light UE support, descent step to with decreased speed    Time  8    Period  Weeks    Status  Partially Met      PT LONG TERM GOAL #4   Title  Patient will increase FOTO score to 65 to demonstrate predicted increase in functional mobility to complete ADLs    Baseline  10/03/19 55    Time  8    Period  Weeks    Status  Deferred            Plan - 11/08/19 1147    Clinical Impression Statement  PT continued therex progression for increased hip flexor strength and motor control with stair ambulation. Patinet is able to comply with all cuing for proper techniques and is motivated by continued success with increasing intensity of therex. PT will continue progression as able.    Personal Factors and Comorbidities  Age;Behavior Pattern;Fitness;Past/Current Experience;Comorbidity 1;Comorbidity 2;Time since onset of injury/illness/exacerbation;Sex    Examination-Activity Limitations  Lift;Stand;Transfers;Carry;Sit;Squat;Stairs    Examination-Participation Restrictions  Church    Stability/Clinical Decision Making  Evolving/Moderate complexity    Clinical Decision Making  Moderate    Rehab Potential  Good    PT Frequency  2x / week    PT Duration  8 weeks    PT Treatment/Interventions  ADLs/Self Care Home Management;Electrical Stimulation;Therapeutic activities;Patient/family education;Joint  Manipulations;Spinal Manipulations;Passive range of motion;Dry needling;Manual techniques;Taping;Therapeutic exercise;Iontophoresis 52m/ml Dexamethasone;Gait training;Stair training;Neuromuscular re-education;Functional mobility training;Ultrasound;Cryotherapy;Traction;Moist Heat    PT Next Visit Plan  motor control, glute activation, balance?    PT Home Exercise Plan  mini squat with band, bridge with band    Consulted and Agree with  Plan of Care  Patient       Patient will benefit from skilled therapeutic intervention in order to improve the following deficits and impairments:  Pain, Improper body mechanics, Increased fascial restricitons, Abnormal gait, Decreased coordination, Decreased mobility, Postural dysfunction, Impaired flexibility, Difficulty walking, Decreased balance, Decreased activity tolerance, Decreased endurance, Decreased range of motion, Decreased strength  Visit Diagnosis: Chronic pain of left knee  Chronic pain of right knee  Muscle weakness (generalized)     Problem List Patient Active Problem List   Diagnosis Date Noted  . History of revision of total replacement of right hip joint 08/15/2019  . Primary localized osteoarthritis of right hip 04/18/2019  . Primary osteoarthritis of right hip 04/18/2019  . Pectus excavatum 10/12/2017  . 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 Perimenopausal atrophic vaginitis 09/22/2016  . Menopausal hot flushes 09/22/2016  . Mitral valve prolapse 01/13/2016  . Herniation of nucleus pulposus 05/01/2015  . Hypercholesteremia 05/01/2015  . Osteoarthritis of both knees 05/01/2015  . History of migraine headaches 05/01/2015  . Esophageal reflux 01/10/2015  . S/P total hip arthroplasty 09/12/2013   CShelton SilvasPT, DPT CShelton Silvas4/02/2020, 11:55 AM  [facility] [address] [place] [address]Phone: [phone]  Fax:  [phone] Name: Wendy BASQUEZMRN: [id number] of Birth: 918-Aug-1937

## 2019-11-09 ENCOUNTER — Ambulatory Visit: Payer: PPO | Admitting: Physical Therapy

## 2019-11-13 ENCOUNTER — Ambulatory Visit: Payer: PPO | Admitting: Physical Therapy

## 2019-11-13 ENCOUNTER — Encounter: Payer: PPO | Admitting: Physical Therapy

## 2019-11-13 DIAGNOSIS — Z96643 Presence of artificial hip joint, bilateral: Secondary | ICD-10-CM | POA: Diagnosis not present

## 2019-11-15 ENCOUNTER — Telehealth: Payer: Self-pay

## 2019-11-15 NOTE — Telephone Encounter (Signed)
LMTCB to inquire about a telephonic AWV prior to CPE on 12/13/19. Direct # left to CB on.

## 2019-11-16 ENCOUNTER — Encounter: Payer: Self-pay | Admitting: Physical Therapy

## 2019-11-16 ENCOUNTER — Other Ambulatory Visit: Payer: Self-pay

## 2019-11-16 ENCOUNTER — Ambulatory Visit: Payer: PPO | Admitting: Physical Therapy

## 2019-11-16 DIAGNOSIS — R262 Difficulty in walking, not elsewhere classified: Secondary | ICD-10-CM

## 2019-11-16 DIAGNOSIS — M25562 Pain in left knee: Secondary | ICD-10-CM

## 2019-11-16 DIAGNOSIS — M6281 Muscle weakness (generalized): Secondary | ICD-10-CM

## 2019-11-16 DIAGNOSIS — G8929 Other chronic pain: Secondary | ICD-10-CM

## 2019-11-16 DIAGNOSIS — R2689 Other abnormalities of gait and mobility: Secondary | ICD-10-CM

## 2019-11-16 DIAGNOSIS — M25561 Pain in right knee: Secondary | ICD-10-CM

## 2019-11-16 NOTE — Therapy (Signed)
[facility] [address] [place], [address] Phone: [phone]   Fax:  [phone]  Physical Therapy Treatment  Patient Details  Name: Wendy Patton MRN: [id number] Date of Birth: 07/15/1936 No data recorded  Encounter Date: 11/16/2019  PT End of Session - 11/16/19 1130    Visit Number  13    Number of Visits  17    Date for PT Re-Evaluation  11/28/19    PT Start Time  4287    PT Stop Time  1201    PT Time Calculation (min)  38 min    Activity Tolerance  Patient tolerated treatment well    Behavior During Therapy  [facility] for tasks assessed/performed       Past Medical History:  Diagnosis Date  . Anemia    after jaw surgery was on iron for short period of time  . Arthritis   . Cancer (HCC)    squamous cell  . Difficult intubation    potential for difficult airway due to limited mouth opening  . Dry skin   . GERD (gastroesophageal reflux disease)    takes Aciphex daily  . Headache(784.0)    hx of migraines   . Heart murmur   . History of migraine    last one Aug 2014 and takes Imitrex daily as needed  . History of staph infection   . IBS (irritable bowel syndrome)   . Joint pain   . Joint swelling   . Laryngopharyngeal reflux   . MVP (mitral valve prolapse)   . Neuralgia   . Neuropathy    takes gabapentin daily  . PONV (postoperative nausea and vomiting)    TMJ surgery several times  . Rheumatic fever    as a child  . Shortness of breath    with exertion  . Thyroid disease     Past Surgical History:  Procedure Laterality Date  . ABDOMINAL HYSTERECTOMY  2009  . BREAST DUCTAL SYSTEM EXCISION Right 01/11/2014   Procedure: MAJOR DUCT EXCISION RIGHT BREAST;  Surgeon: Stark Klein, MD;  Location: [facility];  Service: General;  Laterality: Right;  . BREAST EXCISIONAL BIOPSY Right 2015   neg  . BREAST EXCISIONAL BIOPSY Left 2010   benign papilloma removed  . BREAST EXCISIONAL BIOPSY Left  1975   neg  . BREAST SURGERY  1975, 2010   benign breast tumor  . breat excision  1975   benign breast tumor excision  . CATARACT EXTRACTION W/PHACO Right 07/15/2016   Procedure: CATARACT EXTRACTION PHACO AND INTRAOCULAR LENS PLACEMENT (IOC);  Surgeon: Estill Cotta, MD;  Location: [facility];  Service: Ophthalmology;  Laterality: Right;  Lot # [id number] H [place]: 01:38.6 AP%: 25.0 CDE: 46.89  . CATARACT EXTRACTION W/PHACO Left 09/02/2016   Procedure: CATARACT EXTRACTION PHACO AND INTRAOCULAR LENS PLACEMENT (IOC);  Surgeon: Estill Cotta, MD;  Location: [facility];  Service: Ophthalmology;  Laterality: Left;  [place] 2:05.3 AP% 24.9 CDE 51.84 Fluid pack lot # [id number]  . cervial polyps  1999  . [place]  . COLONOSCOPY    . CYSTECTOMY  1962   vaginal cyst removal  . DILATION AND CURETTAGE OF UTERUS  1984  . ESOPHAGOGASTRODUODENOSCOPY    . granuloma  2002   chin granuloma removed  . [place], 2002, 2004   prosthesis, condyles implants, fossa replaced  . SIGMOIDOSCOPY  1993  . SQUAMOUS CELL CARCINOMA EXCISION  2008  left ankle/skin graft  . [place]  . THYROIDECTOMY  2006   left  . TONSILLECTOMY AND ADENOIDECTOMY  1944  . TOTAL HIP ARTHROPLASTY Left 09/12/2013   Procedure: LEFT TOTAL HIP ARTHROPLASTY ANTERIOR APPROACH;  Surgeon: [name], MD;  Location: [facility];  Service: Orthopedics;  Laterality: Left;  . TOTAL HIP ARTHROPLASTY Right 04/18/2019   Procedure: RIGHT TOTAL HIP ARTHROPLASTY ANTERIOR APPROACH;  Surgeon: Melrose Nakayama, MD;  Location: [facility];  Service: Orthopedics;  Laterality: Right;  . TOTAL HIP ARTHROPLASTY Right 08/15/2019   Procedure: TOTAL HIP ARTHROPLASTY ANTERIOR APPROACH;  Surgeon: Melrose Nakayama, MD;  [facility];  Service: Orthopedics;  Laterality: Right;  . TRAPEZIUM RESECTION  2000, 2004   removal right & left hand trapezium  . TUBAL LIGATION    . uterine polyps  2008   . vaginal cyst removed  1962    There were no vitals filed for this visit.  Subjective Assessment - 11/16/19 1127    Subjective  Reports her knee is feeling good and she is able to go down 3 steps at a time now, which she is pleased with. Compliance with HEP.    Pertinent History  Pt is a 84 year old female presenting s/p R THA in September 2020 with revision 08/15/19. She had HHPT for 3 weeks and has weaned off AD completely, is completing all errands and community ambulation, and driving. She reports she has good mobility and has been able to bathe, dress, and complete all ADLs. Patient reports her chief complaint of R knee pain with stiffness. Patient reports she feels her patella popping with every step of walking, with bending/stooping, lifting, and most movement of the knee. She reports this popping is painful and the knee feels unsteady. She has started using voltaran and reports this helps pain. Worst R knee pain 6/10 and 0/10 at rest. Patient has stairs to enter her home, she has stairs inside the house that she does not use, but that these are painful and she has to use a step to gait patter. Pt is anticiating a R TKA this fall. Pt denies N/V, B&B changes, unexplained weight fluctuation, saddle paresthesia, fever, night sweats, or unrelenting night pain at this time.    How long can you sit comfortably?  Not limited by RLE, but about 2 hours d/t back pain    How long can you stand comfortably?  unlimited    How long can you walk comfortably?  unlimited    Diagnostic tests  None of R knee    Patient Stated Goals  Strengthen knee so knee cap doesn't wobble    Pain Onset  1 to 4 weeks ago         Ther-Ex Nustep L3seat setting L9 UE 10 L4 10mns mostly without UEs; L5 with min UE use for 165m -Stair ambulation up/down 4 steps with reciprocal pattern ascent and descent, with min UE support with descent, normalized speed, with increased knee valgus through swing phase with correction at  foot flat, but continued hip ER and ankle eversion; unable to fully correct this session without pain  - Forward step down from 6in step bilat UE supported 2x 12, allowing patient to lift R heel to aid in this with pain and cuing/demo needed at first set, able to complete during 2nd and 3rd with better technique -STS without UE 22in x6 with UE needed for initiation; x6 21in x5 with some push from mat at post LEs  on mat table at initial reps, UEs needed to control lower from 130-90d to sit, but good overall control with sit -SLR with eccentric lower w/ 2# AW x10 3# AW 2x 8 with cuing for eccentric lower with good carry over -RLEOMEGA leg press5# 4x 10 with cuing initially for alignment with good carry over                       PT Education - 11/16/19 1129    Education Details  therex form, motor control    Person(s) Educated  Patient    Methods  Explanation;Demonstration;Verbal cues    Comprehension  Verbalized understanding;Returned demonstration;Verbal cues required       PT Short Term Goals - 11/01/19 1022      PT SHORT TERM GOAL #1   Title  Pt will be independent with HEP in order to improve strength and decrease back pain in order to improve pain-free function at home and work.    Baseline  10/03/19 HEP given    Time  4    Status  Achieved        PT Long Term Goals - 11/01/19 1022      PT LONG TERM GOAL #1   Title  Pt will increase 10MWT to at least 1.2 m/s in order to demonstrate normal  community ambulation to acheive community errancds and decrease fall risk    Baseline  10/03/19 0.76ms 11/01/19 1.164m    Time  8    Period  Weeks    Status  Revised      PT LONG TERM GOAL #2   Title  Pt will decrease 5TSTS by at least 3 seconds in order to demonstrate clinically significant improvement in LE strength    Baseline  10/03/19 15sec 11/01/19 12.6sec    Time  8    Period  Weeks    Status  On-going      PT LONG TERM GOAL #3   Title  Patinet will demonstrate  reciprocal stair neogiation with unilateral handrail, 1 step/second, to demonstrate PLOF and safety negotiating home    Baseline  10/03/19 Step to leading with LLE ascent (0.91steps/sec); Step to leading with RLE descent (0.8steps/sec) 11/01/19 Reciprocal ascent 1sec/stair with light UE support, descent step to with decreased speed    Time  8    Period  Weeks    Status  Partially Met      PT LONG TERM GOAL #4   Title  Patient will increase FOTO score to 65 to demonstrate predicted increase in functional mobility to complete ADLs    Baseline  10/03/19 55    Time  8    Period  Weeks    Status  Deferred            Plan - 11/16/19 1151    Clinical Impression Statement  PT attempted continuation of therex for motor control of functional movements and strengthening with some success. Patient with some decreased ability to progress resistance/demand of therex d/t some loss of motor control pattern over 9 days out of PT d/t other appointments. Patient is motivated throughout session and verbalized understanding on need for massed practice for maintainance with this. PT will continue progression as able.    Personal Factors and Comorbidities  Age;Behavior Pattern;Fitness;Past/Current Experience;Comorbidity 1;Comorbidity 2;Time since onset of injury/illness/exacerbation;Sex    Examination-Activity Limitations  Lift;Stand;Transfers;Carry;Sit;Squat;Stairs    Examination-Participation Restrictions  Church    Stability/Clinical Decision Making  Evolving/Moderate complexity  Clinical Decision Making  Moderate    Rehab Potential  Good    PT Frequency  2x / week    PT Duration  8 weeks    PT Treatment/Interventions  ADLs/Self Care Home Management;Electrical Stimulation;Therapeutic activities;Patient/family education;Joint Manipulations;Spinal Manipulations;Passive range of motion;Dry needling;Manual techniques;Taping;Therapeutic exercise;Iontophoresis 33m/ml Dexamethasone;Gait training;Stair  training;Neuromuscular re-education;Functional mobility training;Ultrasound;Cryotherapy;Traction;Moist Heat    PT Next Visit Plan  motor control, glute activation, balance?    PT Home Exercise Plan  mini squat with band, bridge with band    Consulted and Agree with Plan of Care  Patient       Patient will benefit from skilled therapeutic intervention in order to improve the following deficits and impairments:  Pain, Improper body mechanics, Increased fascial restricitons, Abnormal gait, Decreased coordination, Decreased mobility, Postural dysfunction, Impaired flexibility, Difficulty walking, Decreased balance, Decreased activity tolerance, Decreased endurance, Decreased range of motion, Decreased strength  Visit Diagnosis: Chronic pain of left knee  Chronic pain of right knee  Muscle weakness (generalized)  Other abnormalities of gait and mobility  Difficulty in walking, not elsewhere classified     Problem List Patient Active Problem List   Diagnosis Date Noted  . History of revision of total replacement of right hip joint 08/15/2019  . Primary localized osteoarthritis of right hip 04/18/2019  . Primary osteoarthritis of right hip 04/18/2019  . Pectus excavatum 10/12/2017  . 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 Perimenopausal atrophic vaginitis 09/22/2016  . Menopausal hot flushes 09/22/2016  . Mitral valve prolapse 01/13/2016  . Herniation of nucleus pulposus 05/01/2015  . Hypercholesteremia 05/01/2015  . Osteoarthritis of both knees 05/01/2015  . History of migraine headaches 05/01/2015  . Esophageal reflux 01/10/2015  . S/P total hip arthroplasty 09/12/2013   CShelton SilvasPT, DPT CShelton Silvas4/15/2021, 11:59 AM  [facility] [address] [place] [address]Phone: [phone]  Fax:  [phone] Name: Wendy MAIONEMRN: [id number] of Birth: 03/23/1936-03-10

## 2019-11-16 NOTE — Telephone Encounter (Signed)
Scheduled telephonic AWV for 11/21/19 @ 9:40 AM.

## 2019-11-20 ENCOUNTER — Other Ambulatory Visit: Payer: Self-pay

## 2019-11-20 ENCOUNTER — Ambulatory Visit: Payer: PPO | Admitting: Physical Therapy

## 2019-11-20 ENCOUNTER — Encounter: Payer: Self-pay | Admitting: Physical Therapy

## 2019-11-20 DIAGNOSIS — M3501 Sicca syndrome with keratoconjunctivitis: Secondary | ICD-10-CM | POA: Diagnosis not present

## 2019-11-20 DIAGNOSIS — M25561 Pain in right knee: Secondary | ICD-10-CM

## 2019-11-20 DIAGNOSIS — R2689 Other abnormalities of gait and mobility: Secondary | ICD-10-CM

## 2019-11-20 DIAGNOSIS — M6281 Muscle weakness (generalized): Secondary | ICD-10-CM

## 2019-11-20 DIAGNOSIS — M25562 Pain in left knee: Secondary | ICD-10-CM

## 2019-11-20 DIAGNOSIS — G8929 Other chronic pain: Secondary | ICD-10-CM

## 2019-11-20 DIAGNOSIS — R262 Difficulty in walking, not elsewhere classified: Secondary | ICD-10-CM

## 2019-11-20 NOTE — Therapy (Signed)
[name] [address] [place], [address] Phone: [phone]   Fax:  [phone]  Physical Therapy Treatment  Patient Details  Name: Wendy Patton MRN: [id number] Date of Birth: Sep 11, 1935 No data recorded  Encounter Date: 11/20/2019  PT End of Session - 11/20/19 1127    Visit Number  14    Number of Visits  17    Date for PT Re-Evaluation  11/28/19    PT Start Time  1120    PT Stop Time  1158    PT Time Calculation (min)  38 min    Activity Tolerance  Patient tolerated treatment well    Behavior During Therapy  [facility] for tasks assessed/performed       Past Medical History:  Diagnosis Date  . Anemia    after jaw surgery was on iron for short period of time  . Arthritis   . Cancer (HCC)    squamous cell  . Difficult intubation    potential for difficult airway due to limited mouth opening  . Dry skin   . GERD (gastroesophageal reflux disease)    takes Aciphex daily  . Headache(784.0)    hx of migraines   . Heart murmur   . History of migraine    last one Aug 2014 and takes Imitrex daily as needed  . History of staph infection   . IBS (irritable bowel syndrome)   . Joint pain   . Joint swelling   . Laryngopharyngeal reflux   . MVP (mitral valve prolapse)   . Neuralgia   . Neuropathy    takes gabapentin daily  . PONV (postoperative nausea and vomiting)    TMJ surgery several times  . Rheumatic fever    as a child  . Shortness of breath    with exertion  . Thyroid disease     Past Surgical History:  Procedure Laterality Date  . ABDOMINAL HYSTERECTOMY  2009  . BREAST DUCTAL SYSTEM EXCISION Right 01/11/2014   Procedure: MAJOR DUCT EXCISION RIGHT BREAST;  Surgeon: Stark Klein, MD;  Location: [facility];  Service: General;  Laterality: Right;  . BREAST EXCISIONAL BIOPSY Right 2015   neg  . BREAST EXCISIONAL BIOPSY Left 2010   benign papilloma removed  . BREAST EXCISIONAL BIOPSY Left  1975   neg  . BREAST SURGERY  1975, 2010   benign breast tumor  . breat excision  1975   benign breast tumor excision  . CATARACT EXTRACTION W/PHACO Right 07/15/2016   Procedure: CATARACT EXTRACTION PHACO AND INTRAOCULAR LENS PLACEMENT (IOC);  Surgeon: Estill Cotta, MD;  Location: [facility];  Service: Ophthalmology;  Laterality: Right;  Lot # [id number] [place]: 01:38.6 AP%: 25.0 CDE: 46.89  . CATARACT EXTRACTION W/PHACO Left 09/02/2016   Procedure: CATARACT EXTRACTION PHACO AND INTRAOCULAR LENS PLACEMENT (IOC);  Surgeon: Estill Cotta, MD;  Location: [facility];  Service: Ophthalmology;  Laterality: Left;  [place] 2:05.3 AP% 24.9 CDE 51.84 Fluid pack lot # [id number]  . cervial polyps  1999  . [place]  . COLONOSCOPY    . CYSTECTOMY  1962   vaginal cyst removal  . DILATION AND CURETTAGE OF UTERUS  1984  . ESOPHAGOGASTRODUODENOSCOPY    . granuloma  2002   chin granuloma removed  . [place], 2002, 2004   prosthesis, condyles implants, fossa replaced  . SIGMOIDOSCOPY  1993  . SQUAMOUS CELL CARCINOMA EXCISION  2008  left ankle/skin graft  . [place]  . THYROIDECTOMY  2006   left  . TONSILLECTOMY AND ADENOIDECTOMY  1944  . TOTAL HIP ARTHROPLASTY Left 09/12/2013   Procedure: LEFT TOTAL HIP ARTHROPLASTY ANTERIOR APPROACH;  Surgeon: [name], MD;  Location: [facility];  Service: Orthopedics;  Laterality: Left;  . TOTAL HIP ARTHROPLASTY Right 04/18/2019   Procedure: RIGHT TOTAL HIP ARTHROPLASTY ANTERIOR APPROACH;  Surgeon: Melrose Nakayama, MD;  Location: [facility];  Service: Orthopedics;  Laterality: Right;  . TOTAL HIP ARTHROPLASTY Right 08/15/2019   Procedure: TOTAL HIP ARTHROPLASTY ANTERIOR APPROACH;  Surgeon: Melrose Nakayama, MD;  Location: [facility];  Service: Orthopedics;  Laterality: Right;  . TRAPEZIUM RESECTION  2000, 2004   removal right & left hand trapezium  . TUBAL LIGATION    . uterine polyps  2008   . vaginal cyst removed  1962    There were no vitals filed for this visit.  Subjective Assessment - 11/20/19 1123    Subjective  Patient went to a funeral service this weekend and that she did well with this. Reports she is continuing to do well with steps, which she is happy with. Has been working on STS at home, focusing on going "staight up". Minimal pain today, 3/10    Pertinent History  Pt is a 84 year old female presenting s/p R THA in September 2020 with revision 08/15/19. She had HHPT for 3 weeks and has weaned off AD completely, is completing all errands and community ambulation, and driving. She reports she has good mobility and has been able to bathe, dress, and complete all ADLs. Patient reports her chief complaint of R knee pain with stiffness. Patient reports she feels her patella popping with every step of walking, with bending/stooping, lifting, and most movement of the knee. She reports this popping is painful and the knee feels unsteady. She has started using voltaran and reports this helps pain. Worst R knee pain 6/10 and 0/10 at rest. Patient has stairs to enter her home, she has stairs inside the house that she does not use, but that these are painful and she has to use a step to gait patter. Pt is anticiating a R TKA this fall. Pt denies N/V, B&B changes, unexplained weight fluctuation, saddle paresthesia, fever, night sweats, or unrelenting night pain at this time.    How long can you sit comfortably?  Not limited by RLE, but about 2 hours d/t back pain    How long can you stand comfortably?  unlimited    How long can you walk comfortably?  unlimited    Diagnostic tests  None of R knee    Patient Stated Goals  Strengthen knee so knee cap doesn't wobble    Pain Onset  1 to 4 weeks ago           Ther-Ex Nustep L3seat setting L9 UE 10 L4 3mns mostly without UEs; L5 with continued min use of UE 281m -Stair ambulation up/down 4 steps with reciprocal pattern  ascentanddescent, with min UE support with descent, normalized speed, with increased knee valgus through swing phase with correction at foot flat, but continued hip ER and ankle eversion; unable to fully correct this session without pain  - Forward step downfrom 6in step bilat UE supported 2x 12 with no pain, some cuing to "slow down" to focus on technique with good carry over  -STS without UE 21in 3x 6 with good carry over from previous sessions, good  control with UE use for last 3-4 in of lower - lateral step up <> down 6in step 3x 6/8/8 with min cuing to prevent knee valgus with good carry over and no increased pain -SLR with eccentric lower w/ 3# AW 3x 10 with cuing for eccentric lower with good carry over -RLEOMEGA leg press5#3x 10 with cuing initially for alignment with good carry over                      PT Education - 11/20/19 1127    Education Details  therex form, motor control    Person(s) Educated  Patient    Methods  Explanation;Demonstration;Verbal cues    Comprehension  Verbalized understanding;Returned demonstration;Verbal cues required       PT Short Term Goals - 11/01/19 1022      PT SHORT TERM GOAL #1   Title  Pt will be independent with HEP in order to improve strength and decrease back pain in order to improve pain-free function at home and work.    Baseline  10/03/19 HEP given    Time  4    Status  Achieved        PT Long Term Goals - 11/01/19 1022      PT LONG TERM GOAL #1   Title  Pt will increase 10MWT to at least 1.2 m/s in order to demonstrate normal  community ambulation to acheive community errancds and decrease fall risk    Baseline  10/03/19 0.47ms 11/01/19 1.166m    Time  8    Period  Weeks    Status  Revised      PT LONG TERM GOAL #2   Title  Pt will decrease 5TSTS by at least 3 seconds in order to demonstrate clinically significant improvement in LE strength    Baseline  10/03/19 15sec 11/01/19 12.6sec    Time  8    Period   Weeks    Status  On-going      PT LONG TERM GOAL #3   Title  Patinet will demonstrate reciprocal stair neogiation with unilateral handrail, 1 step/second, to demonstrate PLOF and safety negotiating home    Baseline  10/03/19 Step to leading with LLE ascent (0.91steps/sec); Step to leading with RLE descent (0.8steps/sec) 11/01/19 Reciprocal ascent 1sec/stair with light UE support, descent step to with decreased speed    Time  8    Period  Weeks    Status  Partially Met      PT LONG TERM GOAL #4   Title  Patient will increase FOTO score to 65 to demonstrate predicted increase in functional mobility to complete ADLs    Baseline  10/03/19 55    Time  8    Period  Weeks    Status  Deferred            Plan - 11/20/19 1140    Clinical Impression Statement  PT continued therex progression with good success. Patient with better ability to progress this session than previous session with excellent carry over of all cuing for technique. PT will continue progression as able.    Personal Factors and Comorbidities  Age;Behavior Pattern;Fitness;Past/Current Experience;Comorbidity 1;Comorbidity 2;Time since onset of injury/illness/exacerbation;Sex    Examination-Activity Limitations  Lift;Stand;Transfers;Carry;Sit;Squat;Stairs    Examination-Participation Restrictions  [employer]    Stability/Clinical Decision Making  Evolving/Moderate complexity    Clinical Decision Making  Moderate    Rehab Potential  Good    PT Frequency  2x / week  PT Duration  8 weeks    PT Treatment/Interventions  ADLs/Self Care Home Management;Electrical Stimulation;Therapeutic activities;Patient/family education;Joint Manipulations;Spinal Manipulations;Passive range of motion;Dry needling;Manual techniques;Taping;Therapeutic exercise;Iontophoresis 18m/ml Dexamethasone;Gait training;Stair training;Neuromuscular re-education;Functional mobility training;Ultrasound;Cryotherapy;Traction;Moist Heat    PT Next Visit Plan  motor  control, glute activation, balance?    PT Home Exercise Plan  mini squat with band, bridge with band    Consulted and Agree with Plan of Care  Patient       Patient will benefit from skilled therapeutic intervention in order to improve the following deficits and impairments:  Pain, Improper body mechanics, Increased fascial restricitons, Abnormal gait, Decreased coordination, Decreased mobility, Postural dysfunction, Impaired flexibility, Difficulty walking, Decreased balance, Decreased activity tolerance, Decreased endurance, Decreased range of motion, Decreased strength  Visit Diagnosis: Chronic pain of left knee  Chronic pain of right knee  Muscle weakness (generalized)  Other abnormalities of gait and mobility  Difficulty in walking, not elsewhere classified     Problem List Patient Active Problem List   Diagnosis Date Noted  . History of revision of total replacement of right hip joint 08/15/2019  . Primary localized osteoarthritis of right hip 04/18/2019  . Primary osteoarthritis of right hip 04/18/2019  . Pectus excavatum 10/12/2017  . 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 Perimenopausal atrophic vaginitis 09/22/2016  . Menopausal hot flushes 09/22/2016  . Mitral valve prolapse 01/13/2016  . Herniation of nucleus pulposus 05/01/2015  . Hypercholesteremia 05/01/2015  . Osteoarthritis of both knees 05/01/2015  . History of migraine headaches 05/01/2015  . Esophageal reflux 01/10/2015  . S/P total hip arthroplasty 09/12/2013   CShelton SilvasPT, DPT CShelton Silvas4/19/2021, 12:06 PM  [facility] [address] [place] [address]Phone: [phone]  Fax:  [phone] Name: JJANAIAH VETRANOMRN: [id number] of Birth: [birth year]

## 2019-11-20 NOTE — Progress Notes (Signed)
Subjective:   Wendy Patton is a 84 y.o. female who presents for Medicare Annual (Subsequent) preventive examination.    This visit is being conducted through telemedicine due to the COVID-19 pandemic. This patient has given me verbal consent via doximity to conduct this visit, patient states they are participating from their home address. Some vital signs may be absent or patient reported.    Patient identification: identified by name, DOB, and current address  Review of Systems:  NA  Cardiac Risk Factors include: advanced age (>2men, >13 women)     Objective:     Vitals: There were no vitals taken for this visit.  There is no height or weight on file to calculate BMI. Unable to obtain vitals due to visit being conducted via telephonically.   Advanced Directives 11/21/2019 10/03/2019 08/14/2019 04/23/2019 04/18/2019 04/13/2019 04/04/2019  Does Patient Have a Medical Advance Directive? Yes Yes Yes Yes Yes Yes Yes  Type of Paramedic of [place];Living will [place];Living will Living will;Healthcare Power of [place];Living will [place];Living will [place];Living will [place];Living will  Does patient want to make changes to medical advance directive? - Yes (Inpatient - patient defers changing a medical advance directive and declines information at this time) No - Patient declined - No - Patient declined - No - Patient declined  Copy of [place] in Chart? Yes - validated most recent copy scanned in chart (See row information) No - copy requested No - copy requested No - copy requested No - copy requested - No - copy requested  Would patient like information on creating a medical advance directive? - No - Patient declined - No - Patient declined No - Patient declined - No - Patient declined  Pre-existing out of facility DNR order (yellow  form or pink MOST form) - - - - - - -    Tobacco Social History   Tobacco Use  Smoking Status Never Smoker  Smokeless Tobacco Never Used     Counseling given: Not Answered   Clinical Intake:  Pre-visit preparation completed: Yes  Pain : No/denies pain Pain Score: 0-No pain     Diabetes: No  How often do you need to have someone help you when you read instructions, pamphlets, or other written materials from your doctor or pharmacy?: 1 - Never  Interpreter Needed?: No  Information entered by :: [facility], LPN  Past Medical History:  Diagnosis Date  . Anemia    after jaw surgery was on iron for short period of time  . Arthritis   . Cancer (HCC)    squamous cell  . Difficult intubation    potential for difficult airway due to limited mouth opening  . Dry skin   . GERD (gastroesophageal reflux disease)    takes Aciphex daily  . Headache(784.0)    hx of migraines   . Heart murmur   . History of migraine    last one Aug 2014 and takes Imitrex daily as needed  . History of staph infection   . IBS (irritable bowel syndrome)   . Joint pain   . Joint swelling   . Laryngopharyngeal reflux   . MVP (mitral valve prolapse)   . Neuralgia   . Neuropathy    takes gabapentin daily  . PONV (postoperative nausea and vomiting)    TMJ surgery several times  . Rheumatic fever  as a child  . Shortness of breath    with exertion  . Thyroid disease    Past Surgical History:  Procedure Laterality Date  . ABDOMINAL HYSTERECTOMY  2009  . BREAST DUCTAL SYSTEM EXCISION Right 01/11/2014   Procedure: MAJOR DUCT EXCISION RIGHT BREAST;  Surgeon: Stark Klein, MD;  Location: [facility];  Service: General;  Laterality: Right;  . BREAST EXCISIONAL BIOPSY Right 2015   neg  . BREAST EXCISIONAL BIOPSY Left 2010   benign papilloma removed  . BREAST EXCISIONAL BIOPSY Left 1975   neg  . BREAST SURGERY  1975, 2010   benign breast tumor  . breat excision  1975   benign breast tumor  excision  . CATARACT EXTRACTION W/PHACO Right 07/15/2016   Procedure: CATARACT EXTRACTION PHACO AND INTRAOCULAR LENS PLACEMENT (IOC);  Surgeon: Estill Cotta, MD;  Location: [facility];  Service: Ophthalmology;  Laterality: Right;  Lot # [id number] H [place]: 01:38.6 AP%: 25.0 CDE: 46.89  . CATARACT EXTRACTION W/PHACO Left 09/02/2016   Procedure: CATARACT EXTRACTION PHACO AND INTRAOCULAR LENS PLACEMENT (IOC);  Surgeon: Estill Cotta, MD;  Location: [facility];  Service: Ophthalmology;  Laterality: Left;  [place] 2:05.3 AP% 24.9 CDE 51.84 Fluid pack lot # [id number]  . cervial polyps  1999  . [place]  . COLONOSCOPY    . CYSTECTOMY  1962   vaginal cyst removal  . DILATION AND CURETTAGE OF UTERUS  1984  . ESOPHAGOGASTRODUODENOSCOPY    . granuloma  2002   chin granuloma removed  . [place], 2002, 2004   prosthesis, condyles implants, fossa replaced  . REVISION TOTAL KNEE ARTHROPLASTY    . SIGMOIDOSCOPY  1993  . SQUAMOUS CELL CARCINOMA EXCISION  2008   left ankle/skin graft  . [place]  . THYROIDECTOMY  2006   left  . TONSILLECTOMY AND ADENOIDECTOMY  1944  . TOTAL HIP ARTHROPLASTY Left 09/12/2013   Procedure: LEFT TOTAL HIP ARTHROPLASTY ANTERIOR APPROACH;  Surgeon: [name], MD;  Location: [facility];  Service: Orthopedics;  Laterality: Left;  . TOTAL HIP ARTHROPLASTY Right 04/18/2019   Procedure: RIGHT TOTAL HIP ARTHROPLASTY ANTERIOR APPROACH;  Surgeon: Melrose Nakayama, MD;  [facility];  Service: Orthopedics;  Laterality: Right;  . TOTAL HIP ARTHROPLASTY Right 08/15/2019   Procedure: TOTAL HIP ARTHROPLASTY ANTERIOR APPROACH;  Surgeon: Melrose Nakayama, MD;  [facility];  Service: Orthopedics;  Laterality: Right;  . TRAPEZIUM RESECTION  2000, 2004   removal right & left hand trapezium  . TUBAL LIGATION    . uterine polyps  2008  . vaginal cyst removed  1962   Family History  Problem Relation Age of  Onset  . Heart attack Maternal Grandmother   . Breast cancer Neg Hx    Social History   Socioeconomic History  . Marital status: Married    Spouse name: Not on file  . Number of children: 3  . Years of education: Not on file  . Highest education level: Master's degree (e.g., MA, MS, MEng, MEd, MSW, MBA)  Occupational History  . Occupation: retired  Tobacco Use  . Smoking status: Never Smoker  . Smokeless tobacco: Never Used  Substance and Sexual Activity  . Alcohol use: No    Alcohol/week: 0.0 standard drinks  . Drug use: No  . Sexual activity: Yes    Birth control/protection: Surgical  Other Topics Concern  . Not on file  Social History Narrative  . Not on  file   Social Determinants of Health   Financial Resource Strain: Low Risk   . Difficulty of Paying Living Expenses: Not hard at all  Food Insecurity: No Food Insecurity  . Worried About Charity fundraiser in the Last Year: Never true  . Ran Out of Food in the Last Year: Never true  Transportation Needs: No Transportation Needs  . Lack of Transportation (Medical): No  . Lack of Transportation (Non-Medical): No  Physical Activity: Insufficiently Active  . Days of Exercise per Week: 3 days  . Minutes of Exercise per Session: 10 min  Stress: No Stress Concern Present  . Feeling of Stress : Not at all  Social Connections: Slightly Isolated  . Frequency of Communication with Friends and Family: More than three times a week  . Frequency of Social Gatherings with Friends and Family: Once a week  . Attends Religious Services: More than 4 times per year  . Active Member of Clubs or Organizations: No  . Attends Archivist Meetings: Never  . Marital Status: Married    Outpatient Encounter Medications as of 11/21/2019  Medication Sig  . acetaminophen (TYLENOL) 650 MG CR tablet Take 650 mg by mouth 2 (two) times daily as needed for pain.  . Ammonium Lactate (AMLACTIN EX) Apply 1 application topically 4 (four)  times daily.  . Calcium Carb-Cholecalciferol (CALCIUM 600+D) 600-800 MG-UNIT TABS Take 1 tablet by mouth 2 (two) times daily.  . cholecalciferol (VITAMIN D3) 25 MCG (1000 UNIT) tablet Take 1,000 Units by mouth daily.  . cycloSPORINE (RESTASIS) 0.05 % ophthalmic emulsion Place 1 drop into both eyes 2 (two) times daily.   . diphenhydrAMINE (BENADRYL) 25 mg capsule Take 25 mg by mouth at bedtime as needed.  . Glucosamine HCl 1000 MG TABS Take 1,000 mg by mouth daily.  . Omega-3 1000 MG CAPS Take 1,000 mg by mouth daily.   . pantoprazole (PROTONIX) 40 MG tablet Take 1 tablet (40 mg total) by mouth 2 (two) times daily.  Vladimir Faster Glycol-Propyl Glycol (SYSTANE OP) Place 1 drop into both eyes 2 (two) times daily as needed (dry eyes).   . vitamin B-12 (CYANOCOBALAMIN) 1000 MCG tablet Take 1,000 mcg by mouth daily.  . vitamin E 1000 UNIT capsule Take 1,000 Units by mouth daily.    Marland Kitchen aspirin 81 MG chewable tablet Chew 1 tablet (81 mg total) by mouth 2 (two) times daily. (Patient not taking: Reported on 11/21/2019)  . estradiol (ESTRACE) 1 MG tablet Take 1 tablet (1 mg total) by mouth 4 (four) times a week. (Patient not taking: Reported on 08/15/2019)  . sodium chloride (MURO 128) 5 % ophthalmic solution Place 1 drop into both eyes as needed (severe dry eyes).  . traMADol (ULTRAM) 50 MG tablet Take 1 tablet (50 mg total) by mouth every 6 (six) hours as needed for moderate pain or severe pain. (Patient not taking: Reported on 11/21/2019)   No facility-administered encounter medications on file as of 11/21/2019.    Activities of Daily Living In your present state of health, do you have any difficulty performing the following activities: 11/21/2019 08/14/2019  Hearing? N N  Comment Wears bilateral hearing aids. -  Vision? N N  Difficulty concentrating or making decisions? N N  Walking or climbing stairs? Y Y  Comment Due to hip pains. since september,2020-disslocation hip  Dressing or bathing? N N  Doing  errands, shopping? N N  Preparing Food and eating ? N -  Using the Toilet? N -  In the past six months, have you accidently leaked urine? Y -  Comment Occasionally due to no sensation with urges. -  Do you have problems with loss of bowel control? N -  Managing your Medications? N -  Managing your Finances? N -  Housekeeping or managing your Housekeeping? N -  Some recent data might be hidden    Patient Care Team: Paulene Floor as PCP - General (Physician Assistant) Minna Merritts, MD as PCP - Cardiology (Cardiology) Melrose Nakayama, MD as Consulting Physician (Orthopedic Surgery) Oneta Rack, MD (Dermatology) Merril Abbe, MD as Referring Physician (Dermatology)    Assessment:   This is a routine wellness examination for [place].  Exercise Activities and Dietary recommendations Current Exercise Habits: Home exercise routine;Structured exercise class, Type of exercise: yoga;stretching(Currently has PT coming out to home.), Time (Minutes): 15(to over 1 hour), Frequency (Times/Week): 7, Weekly Exercise (Minutes/Week): 105, Intensity: Mild, Exercise limited by: orthopedic condition(s)  Goals    . Prevent falls     Recommend to remove any items from the home that may cause slips or trips.       Fall Risk: Fall Risk  11/21/2019 12/13/2018 11/16/2017 11/05/2016 01/13/2016  Falls in the past year? 1 1 Yes Yes No  Number falls in past yr: 1 1 2  or more 2 or more -  Injury with Fall? 1 0 - No -  Risk for fall due to : Impaired balance/gait - - - -  Risk for fall due to: Comment Due to previous total hip replacement. - - - -  Follow up Falls prevention discussed - - - -    FALL RISK PREVENTION PERTAINING TO THE HOME:  Any stairs in or around the home? Yes  If so, are there any without handrails? No   Home free of loose throw rugs in walkways, pet beds, electrical cords, etc? Yes  Adequate lighting in your home to reduce risk of falls? Yes   ASSISTIVE DEVICES  UTILIZED TO PREVENT FALLS:  Life alert? No  Use of a cane, walker or w/c? No  Grab bars in the bathroom? Yes  Shower chair or bench in shower? No  Elevated toilet seat or a handicapped toilet? Yes    TIMED UP AND GO:  Was the test performed? No .    Depression Screen PHQ 2/9 Scores 01/02/2019 12/13/2018 11/16/2017 11/05/2016  PHQ - 2 Score 0 0 0 0     Cognitive Function     6CIT Screen 11/21/2019  What Year? 0 points  What month? 0 points  What time? 0 points  Count back from 20 0 points  Months in reverse 2 points  Repeat phrase 0 points  Total Score 2    Immunization History  Administered Date(s) Administered  . Fluad Quad(high Dose 65+) 04/05/2019  . Influenza Split 05/09/2009, 05/19/2011, 05/09/2012  . Influenza, High Dose Seasonal PF 04/20/2014  . Influenza, Seasonal, Injecte, Preservative Fre 05/26/2017  . Influenza,inj,quad, With Preservative 06/01/2018  . Influenza-Unspecified 04/03/2016  . Pneumococcal Conjugate-13 08/20/2014  . Pneumococcal Polysaccharide-23 02/21/1996, 05/09/2009  . Td 01/13/2016  . Tdap 07/02/2005  . Zoster 10/28/2006  . Zoster Recombinat (Shingrix) 04/08/2018, 06/15/2018, 06/28/2018, 07/05/2018    Qualifies for Shingles Vaccine? Completed series  Tdap: Up to date  Flu Vaccine: Up to date  Pneumococcal Vaccine: Completed series  Screening Tests Health Maintenance  Topic Date Due  . COVID-19 Vaccine (1) Never done  . INFLUENZA VACCINE  03/03/2020  . TETANUS/TDAP  01/12/2026  . DEXA SCAN  Completed  . PNA vac Low Risk Adult  Completed    Cancer Screenings:  Colorectal Screening: No longer required.   Mammogram: No longer required.   Bone Density: Completed 10/11/14. Previous DEXA scan was normal. No repeat needed unless advised by a physician.  Lung Cancer Screening: (Low Dose CT Chest recommended if Age 49-80 years, 30 pack-year currently smoking OR have quit w/in 15years.) does not qualify.   Additional  Screening:  Vision Screening: Recommended annual ophthalmology exams for early detection of glaucoma and other disorders of the eye.  Dental Screening: Recommended annual dental exams for proper oral hygiene  Community Resource Referral:  CRR required this visit?  No       Plan:  I have personally reviewed and addressed the Medicare Annual Wellness questionnaire and have noted the following in the patient's chart:  A. Medical and social history B. Use of alcohol, tobacco or illicit drugs  C. Current medications and supplements D. Functional ability and status E.  Nutritional status F.  Physical activity G. Advance directives H. List of other physicians I.  Hospitalizations, surgeries, and ER visits in previous 12 months J.  [place] such as hearing and vision if needed, cognitive and depression L. Referrals and appointments   In addition, I have reviewed and discussed with patient certain preventive protocols, quality metrics, and best practice recommendations. A written personalized care plan for preventive services as well as general preventive health recommendations were provided to patient. Nurse Health Advisor  Signed,    Aniket Paye [place], [place]  [id number] Nurse Health Advisor   Nurse Notes: None.

## 2019-11-21 ENCOUNTER — Ambulatory Visit (INDEPENDENT_AMBULATORY_CARE_PROVIDER_SITE_OTHER): Payer: PPO

## 2019-11-21 DIAGNOSIS — Z0000 Encounter for general adult medical examination without abnormal findings: Secondary | ICD-10-CM

## 2019-11-21 NOTE — Patient Instructions (Signed)
Wendy Patton , Thank you for taking time to come for your Medicare Wellness Visit. I appreciate your ongoing commitment to your health goals. Please review the following plan we discussed and let me know if I can assist you in the future.   Screening recommendations/referrals: Colonoscopy: No longer required.  Mammogram: No longer required.  Bone Density: Up to date. Previous DEXA scan was normal. No repeat needed unless advised by a physician. Recommended yearly ophthalmology/optometry visit for glaucoma screening and checkup Recommended yearly dental visit for hygiene and checkup  Vaccinations: Influenza vaccine: Up to date Pneumococcal vaccine: Completed series Tdap vaccine: Up to date Shingles vaccine: Completed series    Advanced directives: Currently on file.  Conditions/risks identified: Fall risk prevention discussed today.   Next appointment: 12/13/19 @ 9:00 AM with Carles Collet. Declined scheduling an AWV for 2022 at this time.    Preventive Care 21 Years and Older, Female Preventive care refers to lifestyle choices and visits with your health care provider that can promote health and wellness. What does preventive care include?  A yearly physical exam. This is also called an annual well check.  Dental exams once or twice a year.  Routine eye exams. Ask your health care provider how often you should have your eyes checked.  Personal lifestyle choices, including:  Daily care of your teeth and gums.  Regular physical activity.  Eating a healthy diet.  Avoiding tobacco and drug use.  Limiting alcohol use.  Practicing safe sex.  Taking low-dose aspirin every day.  Taking vitamin and mineral supplements as recommended by your health care provider. What happens during an annual well check? The services and screenings done by your health care provider during your annual well check will depend on your age, overall health, lifestyle risk factors, and family history  of disease. Counseling  Your health care provider may ask you questions about your:  Alcohol use.  Tobacco use.  Drug use.  Emotional well-being.  Home and relationship well-being.  Sexual activity.  Eating habits.  History of falls.  Memory and ability to understand (cognition).  Work and work Statistician.  Reproductive health. Screening  You may have the following tests or measurements:  Height, weight, and BMI.  Blood pressure.  Lipid and cholesterol levels. These may be checked every 5 years, or more frequently if you are over 49 years old.  Skin check.  Lung cancer screening. You may have this screening every year starting at age 65 if you have a 30-pack-year history of smoking and currently smoke or have quit within the past 15 years.  Fecal occult blood test (FOBT) of the stool. You may have this test every year starting at age 52.  Flexible sigmoidoscopy or colonoscopy. You may have a sigmoidoscopy every 5 years or a colonoscopy every 10 years starting at age 75.  Hepatitis C blood test.  Hepatitis B blood test.  Sexually transmitted disease (STD) testing.  Diabetes screening. This is done by checking your blood sugar (glucose) after you have not eaten for a while (fasting). You may have this done every 1-3 years.  Bone density scan. This is done to screen for osteoporosis. You may have this done starting at age 61.  Mammogram. This may be done every 1-2 years. Talk to your health care provider about how often you should have regular mammograms. Talk with your health care provider about your test results, treatment options, and if necessary, the need for more tests. Vaccines  Your health care  provider may recommend certain vaccines, such as:  Influenza vaccine. This is recommended every year.  Tetanus, diphtheria, and acellular pertussis (Tdap, Td) vaccine. You may need a Td booster every 10 years.  Zoster vaccine. You may need this after age  8.  Pneumococcal 13-valent conjugate (PCV13) vaccine. One dose is recommended after age 63.  Pneumococcal polysaccharide (PPSV23) vaccine. One dose is recommended after age 31. Talk to your health care provider about which screenings and vaccines you need and how often you need them. This information is not intended to replace advice given to you by your health care provider. Make sure you discuss any questions you have with your health care provider. Document Released: 08/16/2015 Document Revised: 04/08/2016 Document Reviewed: 05/21/2015 Elsevier Interactive Patient Education  2017 [place] Prevention in the Home Falls can cause injuries. They can happen to people of all ages. There are many things you can do to make your home safe and to help prevent falls. What can I do on the outside of my home?  Regularly fix the edges of walkways and driveways and fix any cracks.  Remove anything that might make you trip as you walk through a door, such as a raised step or threshold.  Trim any bushes or trees on the path to your home.  Use bright outdoor lighting.  Clear any walking paths of anything that might make someone trip, such as rocks or tools.  Regularly check to see if handrails are loose or broken. Make sure that both sides of any steps have handrails.  Any raised decks and porches should have guardrails on the edges.  Have any leaves, snow, or ice cleared regularly.  Use sand or salt on walking paths during winter.  Clean up any spills in your garage right away. This includes oil or grease spills. What can I do in the bathroom?  Use night lights.  Install grab bars by the toilet and in the tub and shower. Do not use towel bars as grab bars.  Use non-skid mats or decals in the tub or shower.  If you need to sit down in the shower, use a plastic, non-slip stool.  Keep the floor dry. Clean up any water that spills on the floor as soon as it happens.  Remove  soap buildup in the tub or shower regularly.  Attach bath mats securely with double-sided non-slip rug tape.  Do not have throw rugs and other things on the floor that can make you trip. What can I do in the bedroom?  Use night lights.  Make sure that you have a light by your bed that is easy to reach.  Do not use any sheets or blankets that are too big for your bed. They should not hang down onto the floor.  Have a firm chair that has side arms. You can use this for support while you get dressed.  Do not have throw rugs and other things on the floor that can make you trip. What can I do in the kitchen?  Clean up any spills right away.  Avoid walking on wet floors.  Keep items that you use a lot in easy-to-reach places.  If you need to reach something above you, use a strong step stool that has a grab bar.  Keep electrical cords out of the way.  Do not use floor polish or wax that makes floors slippery. If you must use wax, use non-skid floor wax.  Do not have throw  rugs and other things on the floor that can make you trip. What can I do with my stairs?  Do not leave any items on the stairs.  Make sure that there are handrails on both sides of the stairs and use them. Fix handrails that are broken or loose. Make sure that handrails are as long as the stairways.  Check any carpeting to make sure that it is firmly attached to the stairs. Fix any carpet that is loose or worn.  Avoid having throw rugs at the top or bottom of the stairs. If you do have throw rugs, attach them to the floor with carpet tape.  Make sure that you have a light switch at the top of the stairs and the bottom of the stairs. If you do not have them, ask someone to add them for you. What else can I do to help prevent falls?  Wear shoes that:  Do not have high heels.  Have rubber bottoms.  Are comfortable and fit you well.  Are closed at the toe. Do not wear sandals.  If you use a  stepladder:  Make sure that it is fully opened. Do not climb a closed stepladder.  Make sure that both sides of the stepladder are locked into place.  Ask someone to hold it for you, if possible.  Clearly mark and make sure that you can see:  Any grab bars or handrails.  First and last steps.  Where the edge of each step is.  Use tools that help you move around (mobility aids) if they are needed. These include:  Canes.  Walkers.  Scooters.  Crutches.  Turn on the lights when you go into a dark area. Replace any light bulbs as soon as they burn out.  Set up your furniture so you have a clear path. Avoid moving your furniture around.  If any of your floors are uneven, fix them.  If there are any pets around you, be aware of where they are.  Review your medicines with your doctor. Some medicines can make you feel dizzy. This can increase your chance of falling. Ask your doctor what other things that you can do to help prevent falls. This information is not intended to replace advice given to you by your health care provider. Make sure you discuss any questions you have with your health care provider. Document Released: 05/16/2009 Document Revised: 12/26/2015 Document Reviewed: 08/24/2014 Elsevier Interactive Patient Education  2017 [employer].

## 2019-11-23 ENCOUNTER — Ambulatory Visit: Payer: PPO | Admitting: Physical Therapy

## 2019-11-23 ENCOUNTER — Other Ambulatory Visit: Payer: Self-pay

## 2019-11-23 ENCOUNTER — Encounter: Payer: Self-pay | Admitting: Physical Therapy

## 2019-11-23 DIAGNOSIS — M25562 Pain in left knee: Secondary | ICD-10-CM | POA: Diagnosis not present

## 2019-11-23 DIAGNOSIS — G8929 Other chronic pain: Secondary | ICD-10-CM

## 2019-11-23 DIAGNOSIS — R262 Difficulty in walking, not elsewhere classified: Secondary | ICD-10-CM

## 2019-11-23 DIAGNOSIS — M25561 Pain in right knee: Secondary | ICD-10-CM

## 2019-11-23 DIAGNOSIS — R2689 Other abnormalities of gait and mobility: Secondary | ICD-10-CM

## 2019-11-23 DIAGNOSIS — M6281 Muscle weakness (generalized): Secondary | ICD-10-CM

## 2019-11-23 NOTE — Therapy (Signed)
[facility] [address] [place], [address] Phone: [phone]   Fax:  [phone]  Physical Therapy Treatment  Patient Details  Name: Wendy Patton MRN: [id number] Date of Birth: 02-Jul-1936 No data recorded  Encounter Date: 11/23/2019  PT End of Session - 11/23/19 1136    Visit Number  15    Number of Visits  17    Date for PT Re-Evaluation  11/28/19    PT Start Time  1117    PT Stop Time  1155    PT Time Calculation (min)  38 min    Activity Tolerance  Patient tolerated treatment well    Behavior During Therapy  [facility] for tasks assessed/performed       Past Medical History:  Diagnosis Date  . Anemia    after jaw surgery was on iron for short period of time  . Arthritis   . Cancer (HCC)    squamous cell  . Difficult intubation    potential for difficult airway due to limited mouth opening  . Dry skin   . GERD (gastroesophageal reflux disease)    takes Aciphex daily  . Headache(784.0)    hx of migraines   . Heart murmur   . History of migraine    last one Aug 2014 and takes Imitrex daily as needed  . History of staph infection   . IBS (irritable bowel syndrome)   . Joint pain   . Joint swelling   . Laryngopharyngeal reflux   . MVP (mitral valve prolapse)   . Neuralgia   . Neuropathy    takes gabapentin daily  . PONV (postoperative nausea and vomiting)    TMJ surgery several times  . Rheumatic fever    as a child  . Shortness of breath    with exertion  . Thyroid disease     Past Surgical History:  Procedure Laterality Date  . ABDOMINAL HYSTERECTOMY  2009  . BREAST DUCTAL SYSTEM EXCISION Right 01/11/2014   Procedure: MAJOR DUCT EXCISION RIGHT BREAST;  Surgeon: Stark Klein, MD;  Location: [facility];  Service: General;  Laterality: Right;  . BREAST EXCISIONAL BIOPSY Right 2015   neg  . BREAST EXCISIONAL BIOPSY Left 2010   benign papilloma removed  . BREAST EXCISIONAL BIOPSY Left  1975   neg  . BREAST SURGERY  1975, 2010   benign breast tumor  . breat excision  1975   benign breast tumor excision  . CATARACT EXTRACTION W/PHACO Right 07/15/2016   Procedure: CATARACT EXTRACTION PHACO AND INTRAOCULAR LENS PLACEMENT (IOC);  Surgeon: Estill Cotta, MD;  Location: [facility];  Service: Ophthalmology;  Laterality: Right;  Lot # [id number] H [place]: 01:38.6 AP%: 25.0 CDE: 46.89  . CATARACT EXTRACTION W/PHACO Left 09/02/2016   Procedure: CATARACT EXTRACTION PHACO AND INTRAOCULAR LENS PLACEMENT (IOC);  Surgeon: Estill Cotta, MD;  Location: [facility];  Service: Ophthalmology;  Laterality: Left;  [place] 2:05.3 AP% 24.9 CDE 51.84 Fluid pack lot # [id number]  . cervial polyps  1999  . [place]  . COLONOSCOPY    . CYSTECTOMY  1962   vaginal cyst removal  . DILATION AND CURETTAGE OF UTERUS  1984  . ESOPHAGOGASTRODUODENOSCOPY    . granuloma  2002   chin granuloma removed  . [place], 2002, 2004   prosthesis, condyles implants, fossa replaced  . REVISION TOTAL KNEE ARTHROPLASTY    . SIGMOIDOSCOPY  1993  .  SQUAMOUS CELL CARCINOMA EXCISION  2008   left ankle/skin graft  . [place]  . THYROIDECTOMY  2006   left  . TONSILLECTOMY AND ADENOIDECTOMY  1944  . TOTAL HIP ARTHROPLASTY Left 09/12/2013   Procedure: LEFT TOTAL HIP ARTHROPLASTY ANTERIOR APPROACH;  Surgeon: [name], MD;  Location: [place];  Service: Orthopedics;  Laterality: Left;  . TOTAL HIP ARTHROPLASTY Right 04/18/2019   Procedure: RIGHT TOTAL HIP ARTHROPLASTY ANTERIOR APPROACH;  Surgeon: Melrose Nakayama, MD;  [facility];  Service: Orthopedics;  Laterality: Right;  . TOTAL HIP ARTHROPLASTY Right 08/15/2019   Procedure: TOTAL HIP ARTHROPLASTY ANTERIOR APPROACH;  Surgeon: Melrose Nakayama, MD;  [facility];  Service: Orthopedics;  Laterality: Right;  . TRAPEZIUM RESECTION  2000, 2004   removal right & left hand trapezium  .  TUBAL LIGATION    . uterine polyps  2008  . vaginal cyst removed  1962    There were no vitals filed for this visit.  Subjective Assessment - 11/23/19 1123    Subjective  Patient reports no pain this am, reporting that if she focuses on her knee alignment she feels much better.    Pertinent History  Pt is a 84 year old female presenting s/p R THA in September 2020 with revision 08/15/19. She had HHPT for 3 weeks and has weaned off AD completely, is completing all errands and community ambulation, and driving. She reports she has good mobility and has been able to bathe, dress, and complete all ADLs. Patient reports her chief complaint of R knee pain with stiffness. Patient reports she feels her patella popping with every step of walking, with bending/stooping, lifting, and most movement of the knee. She reports this popping is painful and the knee feels unsteady. She has started using voltaran and reports this helps pain. Worst R knee pain 6/10 and 0/10 at rest. Patient has stairs to enter her home, she has stairs inside the house that she does not use, but that these are painful and she has to use a step to gait patter. Pt is anticiating a R TKA this fall. Pt denies N/V, B&B changes, unexplained weight fluctuation, saddle paresthesia, fever, night sweats, or unrelenting night pain at this time.    How long can you sit comfortably?  Not limited by RLE, but about 2 hours d/t back pain    How long can you stand comfortably?  unlimited    How long can you walk comfortably?  unlimited    Diagnostic tests  None of R knee    Patient Stated Goals  Strengthen knee so knee cap doesn't wobble    Pain Onset  1 to 4 weeks ago       Ther-Ex Nustep L3seat setting L9 UE 10 L4 43mns mostly without UEs; L5 with continued min use of UE 279m -Stair ambulation up/down 4 steps with reciprocal pattern ascentanddescent, with min UE support with descent, much less lateral displacement, some foot eversion and ankle  ER with LLE step down - Forward step downfrom 6in stepbilat UE supported 2x 12 with no pain, some cuing to "slow down" to focus on technique with good carry over  -STS without UE21in 3x 6 with good carry over from previous sessions, good control with UE use for last 3-4 in of lower - Hip flexion from table with starting position 90d 2x 10 assistance for first couple reps, then patient table to complete at minimal height -SLR with eccentric lower w/ 4#  AW 3x 10 with cuing for eccentric lower with good carry over -RLEOMEGA leg press5#x10; 7# x10; 10# x10 with good success                         PT Education - 11/23/19 1133    Education Details  therex form, motor control    Person(s) Educated  Patient    Methods  Explanation;Demonstration;Verbal cues    Comprehension  Verbalized understanding;Returned demonstration;Verbal cues required       PT Short Term Goals - 11/01/19 1022      PT SHORT TERM GOAL #1   Title  Pt will be independent with HEP in order to improve strength and decrease back pain in order to improve pain-free function at home and work.    Baseline  10/03/19 HEP given    Time  4    Status  Achieved        PT Long Term Goals - 11/01/19 1022      PT LONG TERM GOAL #1   Title  Pt will increase 10MWT to at least 1.2 m/s in order to demonstrate normal  community ambulation to acheive community errancds and decrease fall risk    Baseline  10/03/19 0.53ms 11/01/19 1.130m    Time  8    Period  Weeks    Status  Revised      PT LONG TERM GOAL #2   Title  Pt will decrease 5TSTS by at least 3 seconds in order to demonstrate clinically significant improvement in LE strength    Baseline  10/03/19 15sec 11/01/19 12.6sec    Time  8    Period  Weeks    Status  On-going      PT LONG TERM GOAL #3   Title  Patinet will demonstrate reciprocal stair neogiation with unilateral handrail, 1 step/second, to demonstrate PLOF and safety negotiating home    Baseline   10/03/19 Step to leading with LLE ascent (0.91steps/sec); Step to leading with RLE descent (0.8steps/sec) 11/01/19 Reciprocal ascent 1sec/stair with light UE support, descent step to with decreased speed    Time  8    Period  Weeks    Status  Partially Met      PT LONG TERM GOAL #4   Title  Patient will increase FOTO score to 65 to demonstrate predicted increase in functional mobility to complete ADLs    Baseline  10/03/19 55    Time  8    Period  Weeks    Status  Deferred            Plan - 11/23/19 1148    Clinical Impression Statement  Pt continued therex progression for hip strength and RLE alignment/motor control with good success. Patient is able to increase resistance of all therex, and is working toward goal of standing from chair without UEs. PT will continue progression as able.    Personal Factors and Comorbidities  Age;Behavior Pattern;Fitness;Past/Current Experience;Comorbidity 1;Comorbidity 2;Time since onset of injury/illness/exacerbation;Sex    Examination-Activity Limitations  Lift;Stand;Transfers;Carry;Sit;Squat;Stairs    Examination-Participation Restrictions  [employer]    Stability/Clinical Decision Making  Evolving/Moderate complexity    Clinical Decision Making  Moderate    Rehab Potential  Good    PT Frequency  2x / week    PT Duration  8 weeks    PT Treatment/Interventions  ADLs/Self Care Home Management;Electrical Stimulation;Therapeutic activities;Patient/family education;Joint Manipulations;Spinal Manipulations;Passive range of motion;Dry needling;Manual techniques;Taping;Therapeutic exercise;Iontophoresis 26m58ml Dexamethasone;Gait training;Stair training;Neuromuscular re-education;Functional mobility  training;Ultrasound;Cryotherapy;Traction;Moist Heat    PT Next Visit Plan  motor control, glute activation, balance?    PT Home Exercise Plan  mini squat with band, bridge with band    Consulted and Agree with Plan of Care  Patient       Patient will benefit from  skilled therapeutic intervention in order to improve the following deficits and impairments:  Pain, Improper body mechanics, Increased fascial restricitons, Abnormal gait, Decreased coordination, Decreased mobility, Postural dysfunction, Impaired flexibility, Difficulty walking, Decreased balance, Decreased activity tolerance, Decreased endurance, Decreased range of motion, Decreased strength  Visit Diagnosis: Chronic pain of left knee  Chronic pain of right knee  Muscle weakness (generalized)  Other abnormalities of gait and mobility  Difficulty in walking, not elsewhere classified     Problem List Patient Active Problem List   Diagnosis Date Noted  . History of revision of total replacement of right hip joint 08/15/2019  . Primary localized osteoarthritis of right hip 04/18/2019  . Primary osteoarthritis of right hip 04/18/2019  . Pectus excavatum 10/12/2017  . 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 Perimenopausal atrophic vaginitis 09/22/2016  . Menopausal hot flushes 09/22/2016  . Mitral valve prolapse 01/13/2016  . Herniation of nucleus pulposus 05/01/2015  . Hypercholesteremia 05/01/2015  . Osteoarthritis of both knees 05/01/2015  . History of migraine headaches 05/01/2015  . Esophageal reflux 01/10/2015  . S/P total hip arthroplasty 09/12/2013   Shelton Silvas PT, DPT Shelton Silvas 11/23/2019, 12:06 PM  [facility] [address] [place], [address] Phone: [phone]   Fax:  [phone]  Name: Wendy Patton MRN: [id number] Date of Birth: [birth year]

## 2019-11-27 ENCOUNTER — Other Ambulatory Visit: Payer: Self-pay

## 2019-11-27 ENCOUNTER — Encounter: Payer: Self-pay | Admitting: Physical Therapy

## 2019-11-27 ENCOUNTER — Ambulatory Visit: Payer: PPO | Admitting: Physical Therapy

## 2019-11-27 DIAGNOSIS — G8929 Other chronic pain: Secondary | ICD-10-CM

## 2019-11-27 DIAGNOSIS — R262 Difficulty in walking, not elsewhere classified: Secondary | ICD-10-CM

## 2019-11-27 DIAGNOSIS — M25562 Pain in left knee: Secondary | ICD-10-CM | POA: Diagnosis not present

## 2019-11-27 DIAGNOSIS — R2689 Other abnormalities of gait and mobility: Secondary | ICD-10-CM

## 2019-11-27 DIAGNOSIS — M6281 Muscle weakness (generalized): Secondary | ICD-10-CM

## 2019-11-27 DIAGNOSIS — M25561 Pain in right knee: Secondary | ICD-10-CM

## 2019-11-27 NOTE — Therapy (Signed)
[facility] [address], [place], [address] Phone: [phone]   Fax:  [phone]  Physical Therapy Treatment/Progress Report Reporting Period 11/01/19 - 11/27/19  Patient Details  Name: Wendy Patton MRN: [id number] Date of Birth: [birth year] No data recorded  Encounter Date: 11/27/2019    Past Medical History:  Diagnosis Date  . Anemia    after jaw surgery was on iron for short period of time  . Arthritis   . Cancer (HCC)    squamous cell  . Difficult intubation    potential for difficult airway due to limited mouth opening  . Dry skin   . GERD (gastroesophageal reflux disease)    takes Aciphex daily  . Headache(784.0)    hx of migraines   . Heart murmur   . History of migraine    last one Aug 2014 and takes Imitrex daily as needed  . History of staph infection   . IBS (irritable bowel syndrome)   . Joint pain   . Joint swelling   . Laryngopharyngeal reflux   . MVP (mitral valve prolapse)   . Neuralgia   . Neuropathy    takes gabapentin daily  . PONV (postoperative nausea and vomiting)    TMJ surgery several times  . Rheumatic fever    as a child  . Shortness of breath    with exertion  . Thyroid disease     Past Surgical History:  Procedure Laterality Date  . ABDOMINAL HYSTERECTOMY  2009  . BREAST DUCTAL SYSTEM EXCISION Right 01/11/2014   Procedure: MAJOR DUCT EXCISION RIGHT BREAST;  Surgeon: Stark Klein, MD;  [facility];  Service: General;  Laterality: Right;  . BREAST EXCISIONAL BIOPSY Right 2015   neg  . BREAST EXCISIONAL BIOPSY Left 2010   benign papilloma removed  . BREAST EXCISIONAL BIOPSY Left 1975   neg  . BREAST SURGERY  1975, 2010   benign breast tumor  . breat excision  1975   benign breast tumor excision  . CATARACT EXTRACTION W/PHACO Right 07/15/2016   Procedure: CATARACT EXTRACTION PHACO AND INTRAOCULAR LENS PLACEMENT (IOC);  Surgeon: Estill Cotta,  MD;  Location: [facility];  Service: Ophthalmology;  Laterality: Right;  Lot # [id number] H [place]: 01:38.6 AP%: 25.0 CDE: 46.89  . CATARACT EXTRACTION W/PHACO Left 09/02/2016   Procedure: CATARACT EXTRACTION PHACO AND INTRAOCULAR LENS PLACEMENT (IOC);  Surgeon: Estill Cotta, MD;  Location: [facility];  Service: Ophthalmology;  Laterality: Left;  [place] 2:05.3 AP% 24.9 CDE 51.84 Fluid pack lot # [id number] H  . cervial polyps  1999  . [place]  . COLONOSCOPY    . CYSTECTOMY  1962   vaginal cyst removal  . DILATION AND CURETTAGE OF UTERUS  1984  . ESOPHAGOGASTRODUODENOSCOPY    . granuloma  2002   chin granuloma removed  . [place], 2002, 2004   prosthesis, condyles implants, fossa replaced  . REVISION TOTAL KNEE ARTHROPLASTY    . SIGMOIDOSCOPY  1993  . SQUAMOUS CELL CARCINOMA EXCISION  2008   left ankle/skin graft  . [place]  . THYROIDECTOMY  2006   left  . TONSILLECTOMY AND ADENOIDECTOMY  1944  . TOTAL HIP ARTHROPLASTY Left 09/12/2013   Procedure: LEFT TOTAL HIP ARTHROPLASTY ANTERIOR APPROACH;  Surgeon: [name], MD;  Location: [facility];  Service: Orthopedics;  Laterality: Left;  . TOTAL HIP ARTHROPLASTY Right 04/18/2019   Procedure:  RIGHT TOTAL HIP ARTHROPLASTY ANTERIOR APPROACH;  Surgeon: Melrose Nakayama, MD;  [facility];  Service: Orthopedics;  Laterality: Right;  . TOTAL HIP ARTHROPLASTY Right 08/15/2019   Procedure: TOTAL HIP ARTHROPLASTY ANTERIOR APPROACH;  Surgeon: Melrose Nakayama, MD;  [facility];  Service: Orthopedics;  Laterality: Right;  . TRAPEZIUM RESECTION  2000, 2004   removal right & left hand trapezium  . TUBAL LIGATION    . uterine polyps  2008  . vaginal cyst removed  1962    There were no vitals filed for this visit.  Subjective Assessment - 11/27/19 1121    Subjective  Reports her son told her she was standing better this weekend which she was happy with. Compliance with  HEP. REports no pain today.    Pertinent History  Pt is a 84 year old female presenting s/p R THA in September 2020 with revision 08/15/19. She had HHPT for 3 weeks and has weaned off AD completely, is completing all errands and community ambulation, and driving. She reports she has good mobility and has been able to bathe, dress, and complete all ADLs. Patient reports her chief complaint of R knee pain with stiffness. Patient reports she feels her patella popping with every step of walking, with bending/stooping, lifting, and most movement of the knee. She reports this popping is painful and the knee feels unsteady. She has started using voltaran and reports this helps pain. Worst R knee pain 6/10 and 0/10 at rest. Patient has stairs to enter her home, she has stairs inside the house that she does not use, but that these are painful and she has to use a step to gait patter. Pt is anticiating a R TKA this fall. Pt denies N/V, B&B changes, unexplained weight fluctuation, saddle paresthesia, fever, night sweats, or unrelenting night pain at this time.    How long can you sit comfortably?  Not limited by RLE, but about 2 hours d/t back pain    How long can you stand comfortably?  unlimited    How long can you walk comfortably?  unlimited    Diagnostic tests  None of R knee    Patient Stated Goals  Strengthen knee so knee cap doesn't wobble    Pain Onset  1 to 4 weeks ago           Ther-Ex Nustep L3seat setting L9 UE 10 L4 34mns mostly without UEs; L5 with continued min use of UE 22m - 5xSTS x2 trials, best time 11.9sec -Stair ambulation up/down 4 steps with reciprocal pattern ascentanddescent, with min UE support with descent, much less lateral displacement, some foot eversion and ankle ER with LLE step down -STS without UE21inx6; 20in 3x 6 with good carry over from previous sessions, good control with UE use for last 3-4 in of lower - Hip flexion from table with starting position 90d x10  assistance for first couple reps, then patient table to complete at minimal height -SLR with eccentric lower w/4# AWx10; 5# 2x 10 with cuing for eccentric lower with good carry over -RLEOMEGA leg press10# 2 x10; 12# x10                         PT Short Term Goals - 11/01/19 1022      PT SHORT TERM GOAL #1   Title  Pt will be independent with HEP in order to improve strength and decrease back pain in order to improve pain-free function at home  and work.    Baseline  10/03/19 HEP given    Time  4    Status  Achieved        PT Long Term Goals - 11/01/19 1022      PT LONG TERM GOAL #1   Title  Pt will increase 10MWT to at least 1.2 m/s in order to demonstrate normal  community ambulation to acheive community errancds and decrease fall risk    Baseline  10/03/19 0.34ms 11/01/19 1.118m    Time  8    Period  Weeks    Status  Revised      PT LONG TERM GOAL #2   Title  Pt will decrease 5TSTS by at least 3 seconds in order to demonstrate clinically significant improvement in LE strength    Baseline  10/03/19 15sec 11/01/19 12.6sec    Time  8    Period  Weeks    Status  On-going      PT LONG TERM GOAL #3   Title  Patinet will demonstrate reciprocal stair neogiation with unilateral handrail, 1 step/second, to demonstrate PLOF and safety negotiating home    Baseline  10/03/19 Step to leading with LLE ascent (0.91steps/sec); Step to leading with RLE descent (0.8steps/sec) 11/01/19 Reciprocal ascent 1sec/stair with light UE support, descent step to with decreased speed    Time  8    Period  Weeks    Status  Partially Met      PT LONG TERM GOAL #4   Title  Patient will increase FOTO score to 65 to demonstrate predicted increase in functional mobility to complete ADLs    Baseline  10/03/19 55    Time  8    Period  Weeks    Status  Deferred              Patient will benefit from skilled therapeutic intervention in order to improve the following deficits and  impairments:     Visit Diagnosis: No diagnosis found.     Problem List Patient Active Problem List   Diagnosis Date Noted  . History of revision of total replacement of right hip joint 08/15/2019  . Primary localized osteoarthritis of right hip 04/18/2019  . Primary osteoarthritis of right hip 04/18/2019  . Pectus excavatum 10/12/2017  . 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 Perimenopausal atrophic vaginitis 09/22/2016  . Menopausal hot flushes 09/22/2016  . Mitral valve prolapse 01/13/2016  . Herniation of nucleus pulposus 05/01/2015  . Hypercholesteremia 05/01/2015  . Osteoarthritis of both knees 05/01/2015  . History of migraine headaches 05/01/2015  . Esophageal reflux 01/10/2015  . S/P total hip arthroplasty 09/12/2013    ChShelton Silvas/26/2021, 11:22 AM  [facility] [address]: [phone] Fax:  [phone]: Wendy SALMONSRN: [id number] of Birth: 04/05/29/1937

## 2019-11-30 ENCOUNTER — Encounter: Payer: Self-pay | Admitting: Physical Therapy

## 2019-11-30 ENCOUNTER — Other Ambulatory Visit: Payer: Self-pay

## 2019-11-30 ENCOUNTER — Ambulatory Visit: Payer: PPO | Admitting: Physical Therapy

## 2019-11-30 DIAGNOSIS — R2689 Other abnormalities of gait and mobility: Secondary | ICD-10-CM

## 2019-11-30 DIAGNOSIS — M25562 Pain in left knee: Secondary | ICD-10-CM | POA: Diagnosis not present

## 2019-11-30 DIAGNOSIS — M25561 Pain in right knee: Secondary | ICD-10-CM

## 2019-11-30 DIAGNOSIS — M6281 Muscle weakness (generalized): Secondary | ICD-10-CM

## 2019-11-30 DIAGNOSIS — G8929 Other chronic pain: Secondary | ICD-10-CM

## 2019-11-30 NOTE — Therapy (Signed)
[facility] [address] [place], [address] Phone: [phone]   Fax:  [phone]  Physical Therapy Treatment  Patient Details  Name: Wendy Patton MRN: [id number] Date of Birth: 15-Sep-1935 No data recorded  Encounter Date: 11/30/2019  PT End of Session - 11/30/19 1127    Visit Number  17    Number of Visits  25    Date for PT Re-Evaluation  12/29/19    Authorization - Visit Number  7    Authorization - Number of Visits  10    PT Start Time  1120    PT Stop Time  [id number]    PT Time Calculation (min)  38 min    Activity Tolerance  Patient tolerated treatment well    Behavior During Therapy  [facility] for tasks assessed/performed       Past Medical History:  Diagnosis Date  . Anemia    after jaw surgery was on iron for short period of time  . Arthritis   . Cancer (HCC)    squamous cell  . Difficult intubation    potential for difficult airway due to limited mouth opening  . Dry skin   . GERD (gastroesophageal reflux disease)    takes Aciphex daily  . Headache(784.0)    hx of migraines   . Heart murmur   . History of migraine    last one Aug 2014 and takes Imitrex daily as needed  . History of staph infection   . IBS (irritable bowel syndrome)   . Joint pain   . Joint swelling   . Laryngopharyngeal reflux   . MVP (mitral valve prolapse)   . Neuralgia   . Neuropathy    takes gabapentin daily  . PONV (postoperative nausea and vomiting)    TMJ surgery several times  . Rheumatic fever    as a child  . Shortness of breath    with exertion  . Thyroid disease     Past Surgical History:  Procedure Laterality Date  . ABDOMINAL HYSTERECTOMY  2009  . BREAST DUCTAL SYSTEM EXCISION Right 01/11/2014   Procedure: MAJOR DUCT EXCISION RIGHT BREAST;  Surgeon: Stark Klein, MD;  Location: [facility];  Service: General;  Laterality: Right;  . BREAST EXCISIONAL BIOPSY Right 2015   neg  . BREAST EXCISIONAL  BIOPSY Left 2010   benign papilloma removed  . BREAST EXCISIONAL BIOPSY Left 1975   neg  . BREAST SURGERY  1975, 2010   benign breast tumor  . breat excision  1975   benign breast tumor excision  . CATARACT EXTRACTION W/PHACO Right 07/15/2016   Procedure: CATARACT EXTRACTION PHACO AND INTRAOCULAR LENS PLACEMENT (IOC);  Surgeon: Estill Cotta, MD;  Location: [facility];  Service: Ophthalmology;  Laterality: Right;  Lot # [id number] H [place]: 01:38.6 AP%: 25.0 CDE: 46.89  . CATARACT EXTRACTION W/PHACO Left 09/02/2016   Procedure: CATARACT EXTRACTION PHACO AND INTRAOCULAR LENS PLACEMENT (IOC);  Surgeon: Estill Cotta, MD;  Location: [facility];  Service: Ophthalmology;  Laterality: Left;  [place] 2:05.3 AP% 24.9 CDE 51.84 Fluid pack lot # [id number]  . cervial polyps  1999  . [place]  . COLONOSCOPY    . CYSTECTOMY  1962   vaginal cyst removal  . DILATION AND CURETTAGE OF UTERUS  1984  . ESOPHAGOGASTRODUODENOSCOPY    . granuloma  2002   chin granuloma removed  . [place], 2002, 2004  prosthesis, condyles implants, fossa replaced  . REVISION TOTAL KNEE ARTHROPLASTY    . SIGMOIDOSCOPY  1993  . SQUAMOUS CELL CARCINOMA EXCISION  2008   left ankle/skin graft  . [place]  . THYROIDECTOMY  2006   left  . TONSILLECTOMY AND ADENOIDECTOMY  1944  . TOTAL HIP ARTHROPLASTY Left 09/12/2013   Procedure: LEFT TOTAL HIP ARTHROPLASTY ANTERIOR APPROACH;  Surgeon: [name], MD;  Location: [place];  Service: Orthopedics;  Laterality: Left;  . TOTAL HIP ARTHROPLASTY Right 04/18/2019   Procedure: RIGHT TOTAL HIP ARTHROPLASTY ANTERIOR APPROACH;  Surgeon: Melrose Nakayama, MD;  [facility];  Service: Orthopedics;  Laterality: Right;  . TOTAL HIP ARTHROPLASTY Right 08/15/2019   Procedure: TOTAL HIP ARTHROPLASTY ANTERIOR APPROACH;  Surgeon: Melrose Nakayama, MD;  [facility];  Service: Orthopedics;  Laterality: Right;  .  TRAPEZIUM RESECTION  2000, 2004   removal right & left hand trapezium  . TUBAL LIGATION    . uterine polyps  2008  . vaginal cyst removed  1962    There were no vitals filed for this visit.  Subjective Assessment - 11/30/19 1123    Subjective  Patient reports her hip flexor has been getting sore following increasing her HEP with exercises from PT sessions. Reports her pain is 6/10 on L knee and that she does not have any pain with walking.    Pertinent History  Pt is a 84 year old female presenting s/p R THA in September 2020 with revision 08/15/19. She had HHPT for 3 weeks and has weaned off AD completely, is completing all errands and community ambulation, and driving. She reports she has good mobility and has been able to bathe, dress, and complete all ADLs. Patient reports her chief complaint of R knee pain with stiffness. Patient reports she feels her patella popping with every step of walking, with bending/stooping, lifting, and most movement of the knee. She reports this popping is painful and the knee feels unsteady. She has started using voltaran and reports this helps pain. Worst R knee pain 6/10 and 0/10 at rest. Patient has stairs to enter her home, she has stairs inside the house that she does not use, but that these are painful and she has to use a step to gait patter. Pt is anticiating a R TKA this fall. Pt denies N/V, B&B changes, unexplained weight fluctuation, saddle paresthesia, fever, night sweats, or unrelenting night pain at this time.    How long can you sit comfortably?  Not limited by RLE, but about 2 hours d/t back pain    How long can you stand comfortably?  unlimited    How long can you walk comfortably?  unlimited    Diagnostic tests  None of R knee    Patient Stated Goals  Strengthen knee so knee cap doesn't wobble    Pain Onset  1 to 4 weeks ago        Ther-Ex Nustep L3seat setting L9 no UE 54mins with SPM over 80 entire time with gentle strengthening used to  prevent increased soreness -Stair ambulation up/down 4 steps with reciprocal pattern ascentanddescent, with min UE support with descent,much less lateral displacement, some foot eversion and ankle ER with LLE step down - Hip flexion from chair with starting position 90d 2 x10 with small lift -SLR with eccentric lower w/5# 3x 10/9/8 with cuing for eccentric lower with good carry over -RLEOMEGA leg press10# 3x 10  PT Education - 11/30/19 1126    Education Details  therex form, motor control    Person(s) Educated  Patient    Methods  Explanation;Demonstration;Verbal cues    Comprehension  Verbalized understanding;Returned demonstration;Verbal cues required       PT Short Term Goals - 11/01/19 1022      PT SHORT TERM GOAL #1   Title  Pt will be independent with HEP in order to improve strength and decrease back pain in order to improve pain-free function at home and work.    Baseline  10/03/19 HEP given    Time  4    Status  Achieved        PT Long Term Goals - 11/27/19 1123      PT LONG TERM GOAL #1   Title  Pt will increase 10MWT to at least 1.2 m/s in order to demonstrate normal  community ambulation to acheive community errancds and decrease fall risk    Baseline  10/03/19 0.65m/s 11/01/19 1.73m/s 11/27/19 1.55m/s    Time  8    Period  Weeks    Status  Achieved      PT LONG TERM GOAL #2   Title  Pt will decrease 5TSTS by at least 3 seconds in order to demonstrate clinically significant improvement in LE strength    Baseline  10/03/19 15sec 11/01/19 12.6sec 11/27/19 11.9    Time  8    Period  Weeks    Status  Achieved      PT LONG TERM GOAL #3   Title  Patinet will demonstrate reciprocal stair neogiation with unilateral handrail, 1 step/second, to demonstrate PLOF and safety negotiating home    Baseline  10/03/19 Step to leading with LLE ascent (0.91steps/sec); Step to leading with RLE descent (0.8steps/sec) 11/01/19 Reciprocal ascent  1sec/stair with light UE support, descent step to with decreased speed 11/27/19 reciprocal ascent and descent with unilateral handrail >1sec/step, with some rotation needed for L step down    Time  8    Period  Weeks    Status  On-going      PT LONG TERM GOAL #4   Title  Patient will increase FOTO score to 65 to demonstrate predicted increase in functional mobility to complete ADLs    Baseline  10/03/19 55 11/27/19    Time  8    Period  Weeks      PT LONG TERM GOAL #5   Title  Patient will be able to stand from her toilet height 17in without UE use to demonstrate safety with home transfers/in community in leiu of UE support    Baseline  11/27/19 able to stand from 20in with UE needed for initation    Time  [place] - 11/30/19 1144    Clinical Impression Statement  PT continued therex progression with pain as a guide. Patient reports no increased pain throughout session, and is motivated to complete all therex with accuracy. PT advised patient to rest 2days to allow for healing and ice as needed; verbalized understanding. PT will continue progression as able.    Personal Factors and Comorbidities  Age;Behavior Pattern;Fitness;Past/Current Experience;Comorbidity 1;Comorbidity 2;Time since onset of injury/illness/exacerbation;Sex    Examination-Activity Limitations  Lift;Stand;Transfers;Carry;Sit;Squat;Stairs    Examination-Participation Restrictions  Church    Stability/Clinical Decision Making  Evolving/Moderate complexity    Clinical Decision Making  Moderate  Rehab Potential  Good    PT Frequency  2x / week    PT Duration  8 weeks    PT Treatment/Interventions  ADLs/Self Care Home Management;Electrical Stimulation;Therapeutic activities;Patient/family education;Joint Manipulations;Spinal Manipulations;Passive range of motion;Dry needling;Manual techniques;Taping;Therapeutic exercise;Iontophoresis 4mg /ml Dexamethasone;Gait training;Stair  training;Neuromuscular re-education;Functional mobility training;Ultrasound;Cryotherapy;Traction;Moist Heat    PT Next Visit Plan  motor control, glute activation, balance?    PT Home Exercise Plan  mini squat with band, bridge with band    Consulted and Agree with Plan of Care  Patient       Patient will benefit from skilled therapeutic intervention in order to improve the following deficits and impairments:  Pain, Improper body mechanics, Increased fascial restricitons, Abnormal gait, Decreased coordination, Decreased mobility, Postural dysfunction, Impaired flexibility, Difficulty walking, Decreased balance, Decreased activity tolerance, Decreased endurance, Decreased range of motion, Decreased strength  Visit Diagnosis: Chronic pain of left knee  Chronic pain of right knee  Muscle weakness (generalized)  Other abnormalities of gait and mobility     Problem List Patient Active Problem List   Diagnosis Date Noted  . History of revision of total replacement of right hip joint 08/15/2019  . Primary localized osteoarthritis of right hip 04/18/2019  . Primary osteoarthritis of right hip 04/18/2019  . Pectus excavatum 10/12/2017  . 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 Perimenopausal atrophic vaginitis 09/22/2016  . Menopausal hot flushes 09/22/2016  . Mitral valve prolapse 01/13/2016  . Herniation of nucleus pulposus 05/01/2015  . Hypercholesteremia 05/01/2015  . Osteoarthritis of both knees 05/01/2015  . History of migraine headaches 05/01/2015  . Esophageal reflux 01/10/2015  . S/P total hip arthroplasty 09/12/2013   Shelton Silvas PT, DPT Shelton Silvas 11/30/2019, 12:05 PM  [facility] [address] [place], [address] Phone: [phone]   Fax:  [phone]  Name: Wendy Patton MRN: [id number] Date of Birth: 1936-07-11

## 2019-12-04 ENCOUNTER — Ambulatory Visit: Payer: PPO | Admitting: Physical Therapy

## 2019-12-06 ENCOUNTER — Other Ambulatory Visit: Payer: Self-pay

## 2019-12-06 ENCOUNTER — Ambulatory Visit: Payer: PPO | Attending: Orthopaedic Surgery | Admitting: Physical Therapy

## 2019-12-06 ENCOUNTER — Encounter: Payer: Self-pay | Admitting: Physical Therapy

## 2019-12-06 DIAGNOSIS — M25562 Pain in left knee: Secondary | ICD-10-CM

## 2019-12-06 DIAGNOSIS — R2689 Other abnormalities of gait and mobility: Secondary | ICD-10-CM | POA: Insufficient documentation

## 2019-12-06 DIAGNOSIS — M6281 Muscle weakness (generalized): Secondary | ICD-10-CM | POA: Insufficient documentation

## 2019-12-06 DIAGNOSIS — G8929 Other chronic pain: Secondary | ICD-10-CM | POA: Diagnosis not present

## 2019-12-06 DIAGNOSIS — M25561 Pain in right knee: Secondary | ICD-10-CM | POA: Insufficient documentation

## 2019-12-06 DIAGNOSIS — R262 Difficulty in walking, not elsewhere classified: Secondary | ICD-10-CM | POA: Insufficient documentation

## 2019-12-06 NOTE — Therapy (Signed)
[facility] [address], [place], [address] Phone: [phone]   Fax:  [phone]  Physical Therapy Treatment  Patient Details  Name: Wendy Patton MRN: [id number] Date of Birth: 10-26-1935 No data recorded  Encounter Date: 12/06/2019  PT End of Session - 12/06/19 0834    Visit Number  18    Number of Visits  25    Date for PT Re-Evaluation  12/29/19    Authorization - Visit Number  8    Authorization - Number of Visits  10    PT Start Time  0818    PT Stop Time  0856    PT Time Calculation (min)  38 min    Activity Tolerance  Patient tolerated treatment well    Behavior During Therapy  [facility] for tasks assessed/performed       Past Medical History:  Diagnosis Date  . Anemia    after jaw surgery was on iron for short period of time  . Arthritis   . Cancer (HCC)    squamous cell  . Difficult intubation    potential for difficult airway due to limited mouth opening  . Dry skin   . GERD (gastroesophageal reflux disease)    takes Aciphex daily  . Headache(784.0)    hx of migraines   . Heart murmur   . History of migraine    last one Aug 2014 and takes Imitrex daily as needed  . History of staph infection   . IBS (irritable bowel syndrome)   . Joint pain   . Joint swelling   . Laryngopharyngeal reflux   . MVP (mitral valve prolapse)   . Neuralgia   . Neuropathy    takes gabapentin daily  . PONV (postoperative nausea and vomiting)    TMJ surgery several times  . Rheumatic fever    as a child  . Shortness of breath    with exertion  . Thyroid disease     Past Surgical History:  Procedure Laterality Date  . ABDOMINAL HYSTERECTOMY  2009  . BREAST DUCTAL SYSTEM EXCISION Right 01/11/2014   Procedure: MAJOR DUCT EXCISION RIGHT BREAST;  Surgeon: Stark Klein, MD;  [facility]: [facility];  Service: General;  Laterality: Right;  . BREAST EXCISIONAL BIOPSY Right 2015   neg  . BREAST EXCISIONAL  BIOPSY Left 2010   benign papilloma removed  . BREAST EXCISIONAL BIOPSY Left 1975   neg  . BREAST SURGERY  1975, 2010   benign breast tumor  . breat excision  1975   benign breast tumor excision  . CATARACT EXTRACTION W/PHACO Right 07/15/2016   Procedure: CATARACT EXTRACTION PHACO AND INTRAOCULAR LENS PLACEMENT (IOC);  Surgeon: Estill Cotta, MD;  Location: [facility];  Service: Ophthalmology;  Laterality: Right;  Lot # [id number] H [place]: 01:38.6 AP%: 25.0 CDE: 46.89  . CATARACT EXTRACTION W/PHACO Left 09/02/2016   Procedure: CATARACT EXTRACTION PHACO AND INTRAOCULAR LENS PLACEMENT (IOC);  Surgeon: Estill Cotta, MD;  Location: [facility];  Service: Ophthalmology;  Laterality: Left;  [place] 2:05.3 AP% 24.9 CDE 51.84 Fluid pack lot # [id number] H  . cervial polyps  1999  . [place]  . COLONOSCOPY    . CYSTECTOMY  1962   vaginal cyst removal  . DILATION AND CURETTAGE OF UTERUS  1984  . ESOPHAGOGASTRODUODENOSCOPY    . granuloma  2002   chin granuloma removed  . [place], 2002, 2004  prosthesis, condyles implants, fossa replaced  . REVISION TOTAL KNEE ARTHROPLASTY    . SIGMOIDOSCOPY  1993  . SQUAMOUS CELL CARCINOMA EXCISION  2008   left ankle/skin graft  . [place]  . THYROIDECTOMY  2006   left  . TONSILLECTOMY AND ADENOIDECTOMY  1944  . TOTAL HIP ARTHROPLASTY Left 09/12/2013   Procedure: LEFT TOTAL HIP ARTHROPLASTY ANTERIOR APPROACH;  Surgeon: [name], MD;  Location: [facility];  Service: Orthopedics;  Laterality: Left;  . TOTAL HIP ARTHROPLASTY Right 04/18/2019   Procedure: RIGHT TOTAL HIP ARTHROPLASTY ANTERIOR APPROACH;  Surgeon: Melrose Nakayama, MD;  [facility];  Service: Orthopedics;  Laterality: Right;  . TOTAL HIP ARTHROPLASTY Right 08/15/2019   Procedure: TOTAL HIP ARTHROPLASTY ANTERIOR APPROACH;  Surgeon: Melrose Nakayama, MD;  [facility];  Service: Orthopedics;  Laterality: Right;  .  TRAPEZIUM RESECTION  2000, 2004   removal right & left hand trapezium  . TUBAL LIGATION    . uterine polyps  2008  . vaginal cyst removed  1962    There were no vitals filed for this visit.  Subjective Assessment - 12/06/19 0824    Subjective  Pt continues to report good progress with stairs with no increased pain. Has been diligent with HEP.    Pertinent History  Pt is a 84 year old female presenting s/p R THA in September 2020 with revision 08/15/19. She had HHPT for 3 weeks and has weaned off AD completely, is completing all errands and community ambulation, and driving. She reports she has good mobility and has been able to bathe, dress, and complete all ADLs. Patient reports her chief complaint of R knee pain with stiffness. Patient reports she feels her patella popping with every step of walking, with bending/stooping, lifting, and most movement of the knee. She reports this popping is painful and the knee feels unsteady. She has started using voltaran and reports this helps pain. Worst R knee pain 6/10 and 0/10 at rest. Patient has stairs to enter her home, she has stairs inside the house that she does not use, but that these are painful and she has to use a step to gait patter. Pt is anticiating a R TKA this fall. Pt denies N/V, B&B changes, unexplained weight fluctuation, saddle paresthesia, fever, night sweats, or unrelenting night pain at this time.    How long can you sit comfortably?  Not limited by RLE, but about 2 hours d/t back pain    How long can you stand comfortably?  unlimited    How long can you walk comfortably?  unlimited    Diagnostic tests  None of R knee    Patient Stated Goals  Strengthen knee so knee cap doesn't wobble    Pain Onset  1 to 4 weeks ago      Ther-Ex Nustep L3seat setting L9 no UE 55mins; L4 54mins with SPM over 80 entire time with gentle strengthening used to prevent increased soreness -Stair ambulation up/down 4 steps with reciprocal pattern  ascentanddescent, with min UE support with descent,much less lateral displacement, some foot eversion and ankle ER with LLE step down - Forward step down with BUE support to about 50% of step height x12 -STS without UE 20in 3x 8/6; 19.5in x5; 19in x with good carry over from previous sessions, good control with UE use for last 3-4 in of lower -SLR with eccentric lower w/6# 3x 10with good carry over from previous therex -RLEOMEGA leg press12# 3x 10/10/8  PT Education - 12/06/19 0834    Education Details  therex form, motor control    Person(s) Educated  Patient    Methods  Explanation;Demonstration;Verbal cues    Comprehension  Verbalized understanding;Returned demonstration;Verbal cues required       PT Short Term Goals - 11/01/19 1022      PT SHORT TERM GOAL #1   Title  Pt will be independent with HEP in order to improve strength and decrease back pain in order to improve pain-free function at home and work.    Baseline  10/03/19 HEP given    Time  4    Status  Achieved        PT Long Term Goals - 11/27/19 1123      PT LONG TERM GOAL #1   Title  Pt will increase 10MWT to at least 1.2 m/s in order to demonstrate normal  community ambulation to acheive community errancds and decrease fall risk    Baseline  10/03/19 0.72m/s 11/01/19 1.28m/s 11/27/19 1.68m/s    Time  8    Period  Weeks    Status  Achieved      PT LONG TERM GOAL #2   Title  Pt will decrease 5TSTS by at least 3 seconds in order to demonstrate clinically significant improvement in LE strength    Baseline  10/03/19 15sec 11/01/19 12.6sec 11/27/19 11.9    Time  8    Period  Weeks    Status  Achieved      PT LONG TERM GOAL #3   Title  Patinet will demonstrate reciprocal stair neogiation with unilateral handrail, 1 step/second, to demonstrate PLOF and safety negotiating home    Baseline  10/03/19 Step to leading with LLE ascent (0.91steps/sec); Step to leading with RLE  descent (0.8steps/sec) 11/01/19 Reciprocal ascent 1sec/stair with light UE support, descent step to with decreased speed 11/27/19 reciprocal ascent and descent with unilateral handrail >1sec/step, with some rotation needed for L step down    Time  8    Period  Weeks    Status  On-going      PT LONG TERM GOAL #4   Title  Patient will increase FOTO score to 65 to demonstrate predicted increase in functional mobility to complete ADLs    Baseline  10/03/19 55 11/27/19    Time  8    Period  Weeks      PT LONG TERM GOAL #5   Title  Patient will be able to stand from her toilet height 17in without UE use to demonstrate safety with home transfers/in community in leiu of UE support    Baseline  11/27/19 able to stand from 20in with UE needed for initation    Time  [place] - 12/06/19 [id number]    Clinical Impression Statement  PT continued therex progression for increased hip flexor strenghtening and progression of STS transfer with good success. Patient is able to complete all therex progressions with proper technique following cuing and demo. PT will continue progression as able.    Personal Factors and Comorbidities  Age;Behavior Pattern;Fitness;Past/Current Experience;Comorbidity 1;Comorbidity 2;Time since onset of injury/illness/exacerbation;Sex    Examination-Activity Limitations  Lift;Stand;Transfers;Carry;Sit;Squat;Stairs    Examination-Participation Restrictions  Church    Stability/Clinical Decision Making  Evolving/Moderate complexity    Clinical Decision Making  Moderate    Rehab Potential  Good  PT Frequency  2x / week    PT Duration  8 weeks    PT Treatment/Interventions  ADLs/Self Care Home Management;Electrical Stimulation;Therapeutic activities;Patient/family education;Joint Manipulations;Spinal Manipulations;Passive range of motion;Dry needling;Manual techniques;Taping;Therapeutic exercise;Iontophoresis 4mg /ml Dexamethasone;Gait  training;Stair training;Neuromuscular re-education;Functional mobility training;Ultrasound;Cryotherapy;Traction;Moist Heat    PT Next Visit Plan  motor control, glute activation, balance?    PT Home Exercise Plan  mini squat with band, bridge with band    Consulted and Agree with Plan of Care  Patient       Patient will benefit from skilled therapeutic intervention in order to improve the following deficits and impairments:  Pain, Improper body mechanics, Increased fascial restricitons, Abnormal gait, Decreased coordination, Decreased mobility, Postural dysfunction, Impaired flexibility, Difficulty walking, Decreased balance, Decreased activity tolerance, Decreased endurance, Decreased range of motion, Decreased strength  Visit Diagnosis: Chronic pain of left knee  Chronic pain of right knee  Muscle weakness (generalized)     Problem List Patient Active Problem List   Diagnosis Date Noted  . History of revision of total replacement of right hip joint 08/15/2019  . Primary localized osteoarthritis of right hip 04/18/2019  . Primary osteoarthritis of right hip 04/18/2019  . Pectus excavatum 10/12/2017  . 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 Perimenopausal atrophic vaginitis 09/22/2016  . Menopausal hot flushes 09/22/2016  . Mitral valve prolapse 01/13/2016  . Herniation of nucleus pulposus 05/01/2015  . Hypercholesteremia 05/01/2015  . Osteoarthritis of both knees 05/01/2015  . History of migraine headaches 05/01/2015  . Esophageal reflux 01/10/2015  . S/P total hip arthroplasty 09/12/2013   Shelton Silvas PT, DPT Shelton Silvas 12/06/2019, 9:04 AM  [facility] [address], [place], [address] Phone: [phone]   Fax:  [phone]  Name: ADDISON ABBONDANZA MRN: [id number] Date of Birth: [birth year]

## 2019-12-11 ENCOUNTER — Ambulatory Visit: Payer: PPO | Admitting: Physical Therapy

## 2019-12-11 ENCOUNTER — Other Ambulatory Visit: Payer: Self-pay

## 2019-12-11 ENCOUNTER — Encounter: Payer: Self-pay | Admitting: Physical Therapy

## 2019-12-11 DIAGNOSIS — M25562 Pain in left knee: Secondary | ICD-10-CM

## 2019-12-11 DIAGNOSIS — M25561 Pain in right knee: Secondary | ICD-10-CM

## 2019-12-11 DIAGNOSIS — R2689 Other abnormalities of gait and mobility: Secondary | ICD-10-CM

## 2019-12-11 DIAGNOSIS — G8929 Other chronic pain: Secondary | ICD-10-CM

## 2019-12-11 DIAGNOSIS — R262 Difficulty in walking, not elsewhere classified: Secondary | ICD-10-CM

## 2019-12-11 DIAGNOSIS — M6281 Muscle weakness (generalized): Secondary | ICD-10-CM

## 2019-12-11 NOTE — Therapy (Signed)
[facility] [address] [place], [address] Phone: [phone]   Fax:  [phone]  Physical Therapy Treatment  Patient Details  Name: Wendy Patton MRN: [id number] Date of Birth: [birth year] No data recorded  Encounter Date: 12/11/2019  PT End of Session - 12/11/19 0825    Visit Number  19    Number of Visits  25    Date for PT Re-Evaluation  12/29/19    Authorization - Visit Number  9    Authorization - Number of Visits  10    PT Start Time  [id number]    PT Stop Time  0900    PT Time Calculation (min)  41 min    Activity Tolerance  Patient tolerated treatment well    Behavior During Therapy  [facility] for tasks assessed/performed       Past Medical History:  Diagnosis Date  . Anemia    after jaw surgery was on iron for short period of time  . Arthritis   . Cancer (HCC)    squamous cell  . Difficult intubation    potential for difficult airway due to limited mouth opening  . Dry skin   . GERD (gastroesophageal reflux disease)    takes Aciphex daily  . Headache(784.0)    hx of migraines   . Heart murmur   . History of migraine    last one Aug 2014 and takes Imitrex daily as needed  . History of staph infection   . IBS (irritable bowel syndrome)   . Joint pain   . Joint swelling   . Laryngopharyngeal reflux   . MVP (mitral valve prolapse)   . Neuralgia   . Neuropathy    takes gabapentin daily  . PONV (postoperative nausea and vomiting)    TMJ surgery several times  . Rheumatic fever    as a child  . Shortness of breath    with exertion  . Thyroid disease     Past Surgical History:  Procedure Laterality Date  . ABDOMINAL HYSTERECTOMY  2009  . BREAST DUCTAL SYSTEM EXCISION Right 01/11/2014   Procedure: MAJOR DUCT EXCISION RIGHT BREAST;  Surgeon: Stark Klein, MD;  Location: [facility];  Service: General;  Laterality: Right;  . BREAST EXCISIONAL BIOPSY Right 2015   neg  . BREAST EXCISIONAL  BIOPSY Left 2010   benign papilloma removed  . BREAST EXCISIONAL BIOPSY Left 1975   neg  . BREAST SURGERY  1975, 2010   benign breast tumor  . breat excision  1975   benign breast tumor excision  . CATARACT EXTRACTION W/PHACO Right 07/15/2016   Procedure: CATARACT EXTRACTION PHACO AND INTRAOCULAR LENS PLACEMENT (IOC);  Surgeon: Estill Cotta, MD;  Location: [facility];  Service: Ophthalmology;  Laterality: Right;  Lot # [id number] H [place]: 01:38.6 AP%: 25.0 CDE: 46.89  . CATARACT EXTRACTION W/PHACO Left 09/02/2016   Procedure: CATARACT EXTRACTION PHACO AND INTRAOCULAR LENS PLACEMENT (IOC);  Surgeon: Estill Cotta, MD;  Location: [facility];  Service: Ophthalmology;  Laterality: Left;  [place] 2:05.3 AP% 24.9 CDE 51.84 Fluid pack lot # [id number]  . cervial polyps  1999  . [place]  . COLONOSCOPY    . CYSTECTOMY  1962   vaginal cyst removal  . DILATION AND CURETTAGE OF UTERUS  1984  . ESOPHAGOGASTRODUODENOSCOPY    . granuloma  2002   chin granuloma removed  . [place], 2002, 2004  prosthesis, condyles implants, fossa replaced  . REVISION TOTAL KNEE ARTHROPLASTY    . SIGMOIDOSCOPY  1993  . SQUAMOUS CELL CARCINOMA EXCISION  2008   left ankle/skin graft  . [place]  . THYROIDECTOMY  2006   left  . TONSILLECTOMY AND ADENOIDECTOMY  1944  . TOTAL HIP ARTHROPLASTY Left 09/12/2013   Procedure: LEFT TOTAL HIP ARTHROPLASTY ANTERIOR APPROACH;  Surgeon: [name], MD;  Location: [facility];  Service: Orthopedics;  Laterality: Left;  . TOTAL HIP ARTHROPLASTY Right 04/18/2019   Procedure: RIGHT TOTAL HIP ARTHROPLASTY ANTERIOR APPROACH;  Surgeon: Melrose Nakayama, MD;  Location: [facility];  Service: Orthopedics;  Laterality: Right;  . TOTAL HIP ARTHROPLASTY Right 08/15/2019   Procedure: TOTAL HIP ARTHROPLASTY ANTERIOR APPROACH;  Surgeon: Melrose Nakayama, MD;  Location: [facility];  Service: Orthopedics;  Laterality: Right;  .  TRAPEZIUM RESECTION  2000, 2004   removal right & left hand trapezium  . TUBAL LIGATION    . uterine polyps  2008  . vaginal cyst removed  1962    There were no vitals filed for this visit.  Subjective Assessment - 12/11/19 0822    Subjective  Continues to report fairly good progress. Reports yesterday she had some increased pain, but it feels better today. Is still making strong progress with STS and stair ambulation    Pertinent History  Pt is a 84 year old female presenting s/p R THA in September 2020 with revision 08/15/19. She had HHPT for 3 weeks and has weaned off AD completely, is completing all errands and community ambulation, and driving. She reports she has good mobility and has been able to bathe, dress, and complete all ADLs. Patient reports her chief complaint of R knee pain with stiffness. Patient reports she feels her patella popping with every step of walking, with bending/stooping, lifting, and most movement of the knee. She reports this popping is painful and the knee feels unsteady. She has started using voltaran and reports this helps pain. Worst R knee pain 6/10 and 0/10 at rest. Patient has stairs to enter her home, she has stairs inside the house that she does not use, but that these are painful and she has to use a step to gait patter. Pt is anticiating a R TKA this fall. Pt denies N/V, B&B changes, unexplained weight fluctuation, saddle paresthesia, fever, night sweats, or unrelenting night pain at this time.    How long can you sit comfortably?  Not limited by RLE, but about 2 hours d/t back pain    How long can you stand comfortably?  unlimited    How long can you walk comfortably?  unlimited    Diagnostic tests  None of R knee    Patient Stated Goals  Strengthen knee so knee cap doesn't wobble    Pain Onset  1 to 4 weeks ago       Ther-Ex Nustep L3seat setting L9no UE 1.74mins; L4 3.67mins with SPM over 80 entire time with gentle strengthening used to prevent  increased soreness -Stair ambulation up/down 4 steps with reciprocal pattern ascentanddescent, with min UE support with descent,much less lateral displacement, some foot eversion and ankle ER with LLE step down - Forward step down with BUE support to about 50% of step height x12 -STS without UE 20in 2x 12; 19in x12; with min use of UE for initiation and for last few inches of sit with - hip flex from 90d LE on chair x15 with minimal lift,  CGA for safety  -SLR with eccentric lower w/6#3x 10with cuing for eccentric control with good carry over -[facility] leg press12#3x 10/9/9                         PT Education - 12/11/19 0825    Education Details  therex form, motor control    Person(s) Educated  Patient    Methods  Explanation;Demonstration;Tactile cues;Verbal cues    Comprehension  Verbalized understanding;Returned demonstration;Verbal cues required;Tactile cues required       PT Short Term Goals - 11/01/19 1022      PT SHORT TERM GOAL #1   Title  Pt will be independent with HEP in order to improve strength and decrease back pain in order to improve pain-free function at home and work.    Baseline  10/03/19 HEP given    Time  4    Status  Achieved        PT Long Term Goals - 11/27/19 1123      PT LONG TERM GOAL #1   Title  Pt will increase 10MWT to at least 1.2 m/s in order to demonstrate normal  community ambulation to acheive community errancds and decrease fall risk    Baseline  10/03/19 0.58m/s 11/01/19 1.33m/s 11/27/19 1.60m/s    Time  8    Period  Weeks    Status  Achieved      PT LONG TERM GOAL #2   Title  Pt will decrease 5TSTS by at least 3 seconds in order to demonstrate clinically significant improvement in LE strength    Baseline  10/03/19 15sec 11/01/19 12.6sec 11/27/19 11.9    Time  8    Period  Weeks    Status  Achieved      PT LONG TERM GOAL #3   Title  [place] will demonstrate reciprocal stair neogiation with unilateral handrail,  1 step/second, to demonstrate PLOF and safety negotiating home    Baseline  10/03/19 Step to leading with LLE ascent (0.91steps/sec); Step to leading with RLE descent (0.8steps/sec) 11/01/19 Reciprocal ascent 1sec/stair with light UE support, descent step to with decreased speed 11/27/19 reciprocal ascent and descent with unilateral handrail >1sec/step, with some rotation needed for L step down    Time  8    Period  Weeks    Status  On-going      PT LONG TERM GOAL #4   Title  Patient will increase FOTO score to 65 to demonstrate predicted increase in functional mobility to complete ADLs    Baseline  10/03/19 55 11/27/19    Time  8    Period  Weeks      PT LONG TERM GOAL #5   Title  Patient will be able to stand from her toilet height 17in without UE use to demonstrate safety with home transfers/in community in leiu of UE support    Baseline  11/27/19 able to stand from 20in with UE needed for initation    Time  [place] - 12/11/19 [id number]    Clinical Impression Statement  PT continued therex progression for increased motor control/habituation and strengthening with good carry over. Pt is able to comply with all cuing for proper technique of therex, and is motivated throughout session with minimal increased pain. PT iwll continue progression as able.    Personal Factors  and Comorbidities  Age;Behavior Pattern;Fitness;Past/Current Experience;Comorbidity 1;Comorbidity 2;Time since onset of injury/illness/exacerbation;Sex    Examination-Activity Limitations  Lift;Stand;Transfers;Carry;Sit;Squat;Stairs    Examination-Participation Restrictions  Church    Stability/Clinical Decision Making  Evolving/Moderate complexity    Clinical Decision Making  Moderate    Rehab Potential  Good    PT Frequency  2x / week    PT Duration  8 weeks    PT Treatment/Interventions  ADLs/Self Care Home Management;Electrical Stimulation;Therapeutic activities;Patient/family  education;Joint Manipulations;Spinal Manipulations;Passive range of motion;Dry needling;Manual techniques;Taping;Therapeutic exercise;Iontophoresis 4mg /ml Dexamethasone;Gait training;Stair training;Neuromuscular re-education;Functional mobility training;Ultrasound;Cryotherapy;Traction;Moist Heat    PT Next Visit Plan  motor control, glute activation, balance?    PT Home Exercise Plan  mini squat with band, bridge with band    Consulted and Agree with Plan of Care  Patient       Patient will benefit from skilled therapeutic intervention in order to improve the following deficits and impairments:  Pain, Improper body mechanics, Increased fascial restricitons, Abnormal gait, Decreased coordination, Decreased mobility, Postural dysfunction, Impaired flexibility, Difficulty walking, Decreased balance, Decreased activity tolerance, Decreased endurance, Decreased range of motion, Decreased strength  Visit Diagnosis: Chronic pain of left knee  Chronic pain of right knee  Muscle weakness (generalized)  Other abnormalities of gait and mobility  Difficulty in walking, not elsewhere classified     Problem List Patient Active Problem List   Diagnosis Date Noted  . History of revision of total replacement of right hip joint 08/15/2019  . Primary localized osteoarthritis of right hip 04/18/2019  . Primary osteoarthritis of right hip 04/18/2019  . Pectus excavatum 10/12/2017  . 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 Perimenopausal atrophic vaginitis 09/22/2016  . Menopausal hot flushes 09/22/2016  . Mitral valve prolapse 01/13/2016  . Herniation of nucleus pulposus 05/01/2015  . Hypercholesteremia 05/01/2015  . Osteoarthritis of both knees 05/01/2015  . History of migraine headaches 05/01/2015  . Esophageal reflux 01/10/2015  . S/P total hip arthroplasty 09/12/2013   Shelton Silvas PT, DPT  Shelton Silvas 12/11/2019, 9:06 AM  [facility] [address] [place], [address] Phone: [phone]   Fax:  [phone]  Name: Wendy Patton MRN: [id number] Date of Birth: Aug 05, 1935

## 2019-12-12 NOTE — Progress Notes (Signed)
Annual Exam Visit     Patient: Wendy Patton, Female    DOB: 1936-07-06, 84 y.o.   MRN: [id number] Visit Date: 12/13/2019  Today's Provider: Trinna Post, PA-C   Chief Complaint  Patient presents with  . Annual Exam  I,Porsha C McClurkin,acting as a scribe for Trinna Post, PA-C.,have documented all relevant documentation on the behalf of Trinna Post, PA-C,as directed by  Trinna Post, PA-C while in the presence of Trinna Post, PA-C.  Subjective    Wendy Patton is a 84 y.o. female who presents today for her Annual Exam Visit. She reports consuming a low fat and low sodium diet. Gym/ health club routine includes yoga and walking. She generally feels fairly well. She reports sleeping fairly well. She does have additional problems to discuss today. Patient reports fatigue and states she believes it maybe coming from from having hip surgery September,2020 and dislocated it in January,2021. She reports doing therapy daily.   HPI AWE was w. [facility] on 11/21/2019  No longer taking estrogen, she discontinued this after her hip arthroplasty.       Medications: Outpatient Medications Prior to Visit  Medication Sig  . acetaminophen (TYLENOL) 650 MG CR tablet Take 650 mg by mouth 2 (two) times daily as needed for pain.  . Ammonium Lactate (AMLACTIN EX) Apply 1 application topically 4 (four) times daily.  . Calcium Carb-Cholecalciferol (CALCIUM 600+D) 600-800 MG-UNIT TABS Take 1 tablet by mouth 2 (two) times daily.  . cholecalciferol (VITAMIN D3) 25 MCG (1000 UNIT) tablet Take 1,000 Units by mouth daily.  . Glucosamine HCl 1000 MG TABS Take 1,000 mg by mouth daily.  . Omega-3 1000 MG CAPS Take 1,000 mg by mouth daily.   . pantoprazole (PROTONIX) 40 MG tablet Take 1 tablet (40 mg total) by mouth 2 (two) times daily.  Vladimir Faster Glycol-Propyl Glycol (SYSTANE OP) Place 1 drop into both eyes 2 (two) times daily as needed (dry eyes).   . vitamin B-12  (CYANOCOBALAMIN) 1000 MCG tablet Take 1,000 mcg by mouth daily.  . vitamin E 1000 UNIT capsule Take 1,000 Units by mouth daily.    Marland Kitchen aspirin 81 MG chewable tablet Chew 1 tablet (81 mg total) by mouth 2 (two) times daily. (Patient not taking: Reported on 11/21/2019)  . cycloSPORINE (RESTASIS) 0.05 % ophthalmic emulsion Place 1 drop into both eyes 2 (two) times daily.   . diphenhydrAMINE (BENADRYL) 25 mg capsule Take 25 mg by mouth at bedtime as needed.  Marland Kitchen estradiol (ESTRACE) 1 MG tablet Take 1 tablet (1 mg total) by mouth 4 (four) times a week. (Patient not taking: Reported on 08/15/2019)  . sodium chloride (MURO 128) 5 % ophthalmic solution Place 1 drop into both eyes as needed (severe dry eyes).  . traMADol (ULTRAM) 50 MG tablet Take 1 tablet (50 mg total) by mouth every 6 (six) hours as needed for moderate pain or severe pain. (Patient not taking: Reported on 11/21/2019)   No facility-administered medications prior to visit.    Allergies  Allergen Reactions  . Avelox [Moxifloxacin] Other (See Comments)    C.diff colitis   . Neurontin [Gabapentin] Diarrhea    abd cramping   . Nitrofurantoin Monohyd Macro Hives  . Percocet [Oxycodone-Acetaminophen] Hives  . Pulmicort Turbuhaler [Budesonide] Other (See Comments)    Blisters in mouth   . Amoxicillin Rash    Did it involve swelling of the face/tongue/throat, SOB, or low BP? No Did it involve sudden  or severe rash/hives, skin peeling, or any reaction on the inside of your mouth or nose? No Did you need to seek medical attention at a hospital or doctor's office? Unknown When did it last happen?unknown If all above answers are "NO", may proceed with cephalosporin use.   . Cefdinir Rash  . Chlorhexidine Rash  . Ciprofloxacin Hcl Rash  . Clindamycin/Lincomycin Rash  . Doxycycline Rash  . Entex Hives and Rash  . Hydrocodone Rash  . Other Rash    Dial soap   . Tavist-D [Albertsons Dayhist-D] Rash  . Tizanidine Rash  . [place]  Rash    Patient Care Team: Paulene Floor as PCP - General (Physician Assistant) Minna Merritts, MD as PCP - Cardiology (Cardiology) Melrose Nakayama, MD as Consulting Physician (Orthopedic Surgery) Oneta Rack, MD (Dermatology)  Review of Systems  Constitutional: Positive for fatigue.  HENT: Negative.   Eyes: Positive for redness and itching.  Respiratory: Positive for shortness of breath.   Cardiovascular: Negative.   Gastrointestinal: Negative.   Endocrine: Negative.   Genitourinary: Positive for enuresis.  Musculoskeletal: Positive for arthralgias and gait problem.  Skin: Negative.   Allergic/Immunologic: Positive for environmental allergies.  Neurological: Positive for light-headedness.  Hematological: Negative.   Psychiatric/Behavioral: Negative.       Objective    Vitals: BP 132/88 (BP Location: Right Arm, Patient Position: Sitting, Cuff Size: Normal)   Pulse 83   Temp (!) 96.9 F (36.1 C) (Temporal)   Ht 5\' 8"  (1.727 m)   Wt 133 lb 6.4 oz (60.5 kg)   SpO2 95%   BMI 20.28 kg/m    Physical Exam Constitutional:      Appearance: Normal appearance.  HENT:     Right Ear: Tympanic membrane, ear canal and external ear normal.     Left Ear: Tympanic membrane, ear canal and external ear normal.  Cardiovascular:     Rate and Rhythm: Normal rate and regular rhythm.     Pulses: Normal pulses.     Heart sounds: Normal heart sounds.  Pulmonary:     Effort: Pulmonary effort is normal.     Breath sounds: Normal breath sounds.  Chest:     Comments: Middle of chest small scabbing mole Abdominal:     General: Abdomen is flat. Bowel sounds are normal.     Palpations: Abdomen is soft.  Skin:    General: Skin is warm and dry.  Neurological:     General: No focal deficit present.     Mental Status: She is alert and oriented to person, place, and time.  Psychiatric:        Mood and Affect: Mood normal.        Behavior: Behavior normal.      Most  recent functional status assessment: In your present state of health, do you have any difficulty performing the following activities: 11/21/2019  Hearing? N  Comment Wears bilateral hearing aids.  Vision? N  Difficulty concentrating or making decisions? N  Walking or climbing stairs? Y  Comment Due to hip pains.  Dressing or bathing? N  Doing errands, shopping? N  Preparing Food and eating ? N  Using the Toilet? N  In the past six months, have you accidently leaked urine? Y  Comment Occasionally due to no sensation with urges.  Do you have problems with loss of bowel control? N  Managing your Medications? N  Managing your Finances? N  Housekeeping or managing your Housekeeping? N  Some  recent data might be hidden    Most recent fall risk assessment: Fall Risk  11/21/2019  Falls in the past year? 1  Number falls in past yr: 1  Injury with Fall? 1  Risk for fall due to : Impaired balance/gait  Risk for fall due to: Comment Due to previous total hip replacement.  Follow up Falls prevention discussed     Most recent depression screenings: PHQ 2/9 Scores 01/02/2019 12/13/2018  PHQ - 2 Score 0 0    Most recent cognitive screening: 6CIT Screen 11/21/2019  What Year? 0 points  What month? 0 points  What time? 0 points  Count back from 20 0 points  Months in reverse 2 points  Repeat phrase 0 points  Total Score 2    No results found for any visits on 12/13/19.  Assessment & Plan    1. Annual physical exam   2. Fatigue, unspecified type Patient reports fatigue after having hip surgery. She reports having physical therapy daily to help with mobility after surgery. - TSH - Lipid panel - Comprehensive metabolic panel - CBC with Differential/Platelet  Annual wellness visit done today including the all of the following: Reviewed patient's Family Medical History Reviewed and updated list of patient's medical providers Assessment of cognitive impairment was done Assessed  patient's functional ability Established a written schedule for health screening [place] Completed and Reviewed  Exercise Activities and Dietary recommendations Goals    . Prevent falls     Recommend to remove any items from the home that may cause slips or trips.       Immunization History  Administered Date(s) Administered  . Fluad Quad(high Dose 65+) 04/05/2019  . Influenza Split 05/09/2009, 05/19/2011, 05/09/2012  . Influenza, High Dose Seasonal PF 04/20/2014  . Influenza, Seasonal, Injecte, Preservative Fre 05/26/2017  . Influenza,inj,quad, With Preservative 06/01/2018  . Influenza-Unspecified 04/03/2016  . Pneumococcal Conjugate-13 08/20/2014  . Pneumococcal Polysaccharide-23 02/21/1996, 05/09/2009  . Td 01/13/2016  . Tdap 07/02/2005  . Zoster 10/28/2006  . Zoster Recombinat (Shingrix) 04/08/2018, 06/15/2018, 06/28/2018, 07/05/2018    Health Maintenance  Topic Date Due  . COVID-19 Vaccine (1) Never done  . INFLUENZA VACCINE  03/03/2020  . TETANUS/TDAP  01/12/2026  . DEXA SCAN  Completed  . PNA vac Low Risk Adult  Completed     Discussed health benefits of physical activity, and encouraged her to engage in regular exercise appropriate for her age and condition.      Return in about 1 year (around 12/12/2020) for CPE.     ITrinna Post, PA-C, have reviewed all documentation for this visit. The documentation on 12/13/19 for the exam, diagnosis, procedures, and orders are all accurate and complete.    Paulene Floor  [facility] [phone] (phone) [phone] (fax)  [place]

## 2019-12-13 ENCOUNTER — Encounter: Payer: Self-pay | Admitting: Physician Assistant

## 2019-12-13 ENCOUNTER — Other Ambulatory Visit: Payer: Self-pay

## 2019-12-13 ENCOUNTER — Encounter: Payer: Self-pay | Admitting: Physical Therapy

## 2019-12-13 ENCOUNTER — Ambulatory Visit (INDEPENDENT_AMBULATORY_CARE_PROVIDER_SITE_OTHER): Payer: PPO | Admitting: Physician Assistant

## 2019-12-13 ENCOUNTER — Ambulatory Visit: Payer: PPO | Admitting: Physical Therapy

## 2019-12-13 VITALS — BP 132/88 | HR 83 | Temp 96.9°F | Ht 68.0 in | Wt 133.4 lb

## 2019-12-13 DIAGNOSIS — Z0000 Encounter for general adult medical examination without abnormal findings: Secondary | ICD-10-CM | POA: Diagnosis not present

## 2019-12-13 DIAGNOSIS — M25562 Pain in left knee: Secondary | ICD-10-CM | POA: Diagnosis not present

## 2019-12-13 DIAGNOSIS — M25561 Pain in right knee: Secondary | ICD-10-CM

## 2019-12-13 DIAGNOSIS — R5383 Other fatigue: Secondary | ICD-10-CM

## 2019-12-13 DIAGNOSIS — R262 Difficulty in walking, not elsewhere classified: Secondary | ICD-10-CM

## 2019-12-13 DIAGNOSIS — M6281 Muscle weakness (generalized): Secondary | ICD-10-CM

## 2019-12-13 DIAGNOSIS — R2689 Other abnormalities of gait and mobility: Secondary | ICD-10-CM

## 2019-12-13 NOTE — Therapy (Signed)
[facility] [address], [place], [address] Phone: [phone]   Fax:  [phone]  Physical Therapy Treatment/Progress Note Reporting period 11/01/19 - 12/13/19  Patient Details  Name: Wendy Patton MRN: [id number] Date of Birth: [birth year] No data recorded  Encounter Date: 12/13/2019  PT End of Session - 12/13/19 1312    Visit Number  20    Number of Visits  25    Date for PT Re-Evaluation  12/29/19    Authorization - Visit Number  10    Authorization - Number of Visits  10    PT Start Time  0103    PT Stop Time  0145    PT Time Calculation (min)  42 min    Activity Tolerance  Patient tolerated treatment well    Behavior During Therapy  [facility] for tasks assessed/performed       Past Medical History:  Diagnosis Date  . Anemia    after jaw surgery was on iron for short period of time  . Arthritis   . Cancer (HCC)    squamous cell  . Difficult intubation    potential for difficult airway due to limited mouth opening  . Dry skin   . GERD (gastroesophageal reflux disease)    takes Aciphex daily  . Headache(784.0)    hx of migraines   . Heart murmur   . History of migraine    last one Aug 2014 and takes Imitrex daily as needed  . History of staph infection   . IBS (irritable bowel syndrome)   . Joint pain   . Joint swelling   . Laryngopharyngeal reflux   . MVP (mitral valve prolapse)   . Neuralgia   . Neuropathy    takes gabapentin daily  . PONV (postoperative nausea and vomiting)    TMJ surgery several times  . Rheumatic fever    as a child  . Shortness of breath    with exertion  . Thyroid disease     Past Surgical History:  Procedure Laterality Date  . ABDOMINAL HYSTERECTOMY  2009  . BREAST DUCTAL SYSTEM EXCISION Right 01/11/2014   Procedure: MAJOR DUCT EXCISION RIGHT BREAST;  Surgeon: Stark Klein, MD;  Location: [facility];  Service: General;  Laterality: Right;  . BREAST EXCISIONAL  BIOPSY Right 2015   neg  . BREAST EXCISIONAL BIOPSY Left 2010   benign papilloma removed  . BREAST EXCISIONAL BIOPSY Left 1975   neg  . BREAST SURGERY  1975, 2010   benign breast tumor  . breat excision  1975   benign breast tumor excision  . CATARACT EXTRACTION W/PHACO Right 07/15/2016   Procedure: CATARACT EXTRACTION PHACO AND INTRAOCULAR LENS PLACEMENT (IOC);  Surgeon: Estill Cotta, MD;  Location: [facility];  Service: Ophthalmology;  Laterality: Right;  Lot # [id number] H [place]: 01:38.6 AP%: 25.0 CDE: 46.89  . CATARACT EXTRACTION W/PHACO Left 09/02/2016   Procedure: CATARACT EXTRACTION PHACO AND INTRAOCULAR LENS PLACEMENT (IOC);  Surgeon: Estill Cotta, MD;  Location: [facility];  Service: Ophthalmology;  Laterality: Left;  [place] 2:05.3 AP% 24.9 CDE 51.84 Fluid pack lot # [id number]  . cervial polyps  1999  . [place]  . COLONOSCOPY    . CYSTECTOMY  1962   vaginal cyst removal  . DILATION AND CURETTAGE OF UTERUS  1984  . ESOPHAGOGASTRODUODENOSCOPY    . granuloma  2002   chin granuloma removed  . [place]  1980, 1984,  1986, 1993, 2002, 2004   prosthesis, condyles implants, fossa replaced  . REVISION TOTAL KNEE ARTHROPLASTY    . SIGMOIDOSCOPY  1993  . SQUAMOUS CELL CARCINOMA EXCISION  2008   left ankle/skin graft  . [place]  . THYROIDECTOMY  2006   left  . TONSILLECTOMY AND ADENOIDECTOMY  1944  . TOTAL HIP ARTHROPLASTY Left 09/12/2013   Procedure: LEFT TOTAL HIP ARTHROPLASTY ANTERIOR APPROACH;  Surgeon: [name], MD;  Location: [facility];  Service: Orthopedics;  Laterality: Left;  . TOTAL HIP ARTHROPLASTY Right 04/18/2019   Procedure: RIGHT TOTAL HIP ARTHROPLASTY ANTERIOR APPROACH;  Surgeon: Melrose Nakayama, MD;  [facility];  Service: Orthopedics;  Laterality: Right;  . TOTAL HIP ARTHROPLASTY Right 08/15/2019   Procedure: TOTAL HIP ARTHROPLASTY ANTERIOR APPROACH;  Surgeon: Melrose Nakayama, MD;  [facility];   Service: Orthopedics;  Laterality: Right;  . TRAPEZIUM RESECTION  2000, 2004   removal right & left hand trapezium  . TUBAL LIGATION    . uterine polyps  2008  . vaginal cyst removed  1962    There were no vitals filed for this visit.  Subjective Assessment - 12/13/19 1310    Subjective  Patient is pleased to report she walked down all the 30steps at her doctors office with correct gait pattern and no pain. Reports no pain since monday    Pertinent History  Pt is a 84 year old female presenting s/p R THA in September 2020 with revision 08/15/19. She had HHPT for 3 weeks and has weaned off AD completely, is completing all errands and community ambulation, and driving. She reports she has good mobility and has been able to bathe, dress, and complete all ADLs. Patient reports her chief complaint of R knee pain with stiffness. Patient reports she feels her patella popping with every step of walking, with bending/stooping, lifting, and most movement of the knee. She reports this popping is painful and the knee feels unsteady. She has started using voltaran and reports this helps pain. Worst R knee pain 6/10 and 0/10 at rest. Patient has stairs to enter her home, she has stairs inside the house that she does not use, but that these are painful and she has to use a step to gait patter. Pt is anticiating a R TKA this fall. Pt denies N/V, B&B changes, unexplained weight fluctuation, saddle paresthesia, fever, night sweats, or unrelenting night pain at this time.    How long can you sit comfortably?  Not limited by RLE, but about 2 hours d/t back pain    How long can you stand comfortably?  unlimited    How long can you walk comfortably?  unlimited    Diagnostic tests  None of R knee    Patient Stated Goals  Strengthen knee so knee cap doesn't wobble    Pain Onset  1 to 4 weeks ago       Ther-Ex Nustep L3seat setting L9no UE1.93mins; L4 3.74minswith SPM over 80 entire time with gentle strengthening  used to prevent increased soreness -Stair ambulation up/down 4 steps with reciprocal pattern ascentanddescent, with min UE support with descent,much less lateral displacement, some foot eversion and ankle ER with LLE step down -STS 19in 3 x12/10/10; with min use of UE for initiation and for last few inches of sit, some bilat knee pain during that subsides with rest - hip flex from 90d LE on chair 2x 12 with minimal lift, CGA for safety  - Demo  with patient demonstrating good carry over of mini lunge with unilateral UE support -SLR with eccentric lower w/6#3x 10/10/9withcuing for eccentric control with good carry over -[facility] leg press12#3x 10/9/9                          PT Education - 12/13/19 1311    Education Details  therex form, motor control    Person(s) Educated  Patient    Methods  Explanation;Demonstration;Verbal cues    Comprehension  Verbalized understanding;Returned demonstration;Verbal cues required       PT Short Term Goals - 11/01/19 1022      PT SHORT TERM GOAL #1   Title  Pt will be independent with HEP in order to improve strength and decrease back pain in order to improve pain-free function at home and work.    Baseline  10/03/19 HEP given    Time  4    Status  Achieved        PT Long Term Goals - 12/13/19 1339      PT LONG TERM GOAL #1   Title  Pt will increase 10MWT to at least 1.2 m/s in order to demonstrate normal  community ambulation to acheive community errancds and decrease fall risk    Baseline  10/03/19 0.107m/s 11/01/19 1.68m/s 11/27/19 1.55m/s    Time  8    Period  Weeks    Status  Achieved      PT LONG TERM GOAL #2   Title  Pt will decrease 5TSTS by at least 3 seconds in order to demonstrate clinically significant improvement in LE strength    Baseline  10/03/19 15sec 11/01/19 12.6sec 11/27/19 11.9    Time  8    Period  Weeks    Status  Achieved      PT LONG TERM GOAL #3   Title  Patinet will demonstrate reciprocal  stair neogiation with unilateral handrail, 1 step/second, to demonstrate PLOF and safety negotiating home    Baseline  10/03/19 Step to leading with LLE ascent (0.91steps/sec); Step to leading with RLE descent (0.8steps/sec) 11/01/19 Reciprocal ascent 1sec/stair with light UE support, descent step to with decreased speed 11/27/19 reciprocal ascent and descent with unilateral handrail >1sec/step, with some rotation needed for L step down    Time  8    Period  Weeks    Status  Deferred      PT LONG TERM GOAL #4   Title  Patient will increase FOTO score to 65 to demonstrate predicted increase in functional mobility to complete ADLs    Baseline  10/03/19 55 11/27/19    Time  8    Period  Weeks    Status  Deferred      PT LONG TERM GOAL #5   Title  Patient will be able to stand from her toilet height 17in with min UE use to demonstrate safety with home transfers/in community in leiu of UE support    Baseline  11/27/19 able to stand from 20in with UE needed for initation    Time  8    Period  Weeks    Status  Revised            Plan - 12/13/19 1323    Clinical Impression Statement  PT continued therex progression for increased hip flexor strength and functional movement patterns with good carry over of all provided cuing. Patient is demonstrating good progress toward goals, motivation without increased pain. PT will  continue progression as able.    Personal Factors and Comorbidities  Age;Behavior Pattern;Fitness;Past/Current Experience;Comorbidity 1;Comorbidity 2;Time since onset of injury/illness/exacerbation;Sex    Examination-Activity Limitations  Lift;Stand;Transfers;Carry;Sit;Squat;Stairs    Examination-Participation Restrictions  Church    Stability/Clinical Decision Making  Evolving/Moderate complexity    Clinical Decision Making  Moderate    Rehab Potential  Good    PT Frequency  2x / week    PT Duration  8 weeks    PT Treatment/Interventions  ADLs/Self Care Home Management;Electrical  Stimulation;Therapeutic activities;Patient/family education;Joint Manipulations;Spinal Manipulations;Passive range of motion;Dry needling;Manual techniques;Taping;Therapeutic exercise;Iontophoresis 4mg /ml Dexamethasone;Gait training;Stair training;Neuromuscular re-education;Functional mobility training;Ultrasound;Cryotherapy;Traction;Moist Heat    PT Next Visit Plan  motor control, glute activation, balance?    PT Home Exercise Plan  mini squat with band, bridge with band    Consulted and Agree with Plan of Care  Patient       Patient will benefit from skilled therapeutic intervention in order to improve the following deficits and impairments:  Pain, Improper body mechanics, Increased fascial restricitons, Abnormal gait, Decreased coordination, Decreased mobility, Postural dysfunction, Impaired flexibility, Difficulty walking, Decreased balance, Decreased activity tolerance, Decreased endurance, Decreased range of motion, Decreased strength  Visit Diagnosis: Chronic pain of left knee  Chronic pain of right knee  Muscle weakness (generalized)  Other abnormalities of gait and mobility  Difficulty in walking, not elsewhere classified     Problem List Patient Active Problem List   Diagnosis Date Noted  . History of revision of total replacement of right hip joint 08/15/2019  . Primary localized osteoarthritis of right hip 04/18/2019  . Primary osteoarthritis of right hip 04/18/2019  . Pectus excavatum 10/12/2017  . 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 Perimenopausal atrophic vaginitis 09/22/2016  . Menopausal hot flushes 09/22/2016  . Mitral valve prolapse 01/13/2016  . Herniation of nucleus pulposus 05/01/2015  . Hypercholesteremia 05/01/2015  . Osteoarthritis of both knees 05/01/2015  . History of migraine headaches 05/01/2015  . Esophageal reflux 01/10/2015  . S/P total hip arthroplasty 09/12/2013   Shelton Silvas PT, DPT Shelton Silvas 12/13/2019, 1:47 PM  [facility] [address] [place], [address] Phone: [phone]   Fax:  [phone]  Name: Wendy Patton MRN: [id number] Date of Birth: [birth year]

## 2019-12-14 LAB — CBC WITH DIFFERENTIAL/PLATELET
Basophils Absolute: 0.1 10*3/uL (ref 0.0–0.2)
Basos: 1 %
EOS (ABSOLUTE): 0.2 10*3/uL (ref 0.0–0.4)
Eos: 5 %
Hematocrit: 41.4 % (ref 34.0–46.6)
Hemoglobin: 13.6 g/dL (ref 11.1–15.9)
Immature Grans (Abs): 0 10*3/uL (ref 0.0–0.1)
Immature Granulocytes: 0 %
Lymphocytes Absolute: 1.4 10*3/uL (ref 0.7–3.1)
Lymphs: 28 %
MCH: 27.3 pg (ref 26.6–33.0)
MCHC: 32.9 g/dL (ref 31.5–35.7)
MCV: 83 fL (ref 79–97)
Monocytes Absolute: 0.4 10*3/uL (ref 0.1–0.9)
Monocytes: 7 %
Neutrophils Absolute: 2.9 10*3/uL (ref 1.4–7.0)
Neutrophils: 59 %
Platelets: 278 10*3/uL (ref 150–450)
RBC: 4.98 x10E6/uL (ref 3.77–5.28)
RDW: 13.2 % (ref 11.7–15.4)
WBC: 5 10*3/uL (ref 3.4–10.8)

## 2019-12-14 LAB — COMPREHENSIVE METABOLIC PANEL
ALT: 12 IU/L (ref 0–32)
AST: 22 IU/L (ref 0–40)
Albumin/Globulin Ratio: 1.8 (ref 1.2–2.2)
Albumin: 4.5 g/dL (ref 3.6–4.6)
Alkaline Phosphatase: 59 IU/L (ref 39–117)
BUN/Creatinine Ratio: 17 (ref 12–28)
BUN: 18 mg/dL (ref 8–27)
Bilirubin Total: 0.3 mg/dL (ref 0.0–1.2)
CO2: 25 mmol/L (ref 20–29)
Calcium: 10 mg/dL (ref 8.7–10.3)
Chloride: 97 mmol/L (ref 96–106)
Creatinine, Ser: 1.04 mg/dL — ABNORMAL HIGH (ref 0.57–1.00)
GFR calc Af Amer: 58 mL/min/{1.73_m2} — ABNORMAL LOW (ref >59)
GFR calc non Af Amer: 51 mL/min/{1.73_m2} — ABNORMAL LOW (ref >59)
Globulin, Total: 2.5 g/dL (ref 1.5–4.5)
Glucose: 98 mg/dL (ref 65–99)
Potassium: 4.6 mmol/L (ref 3.5–5.2)
Sodium: 136 mmol/L (ref 134–144)
Total Protein: 7 g/dL (ref 6.0–8.5)

## 2019-12-14 LAB — LIPID PANEL
Chol/HDL Ratio: 2.6 ratio (ref 0.0–4.4)
Cholesterol, Total: 255 mg/dL — ABNORMAL HIGH (ref 100–199)
HDL: 99 mg/dL (ref >39)
LDL Chol Calc (NIH): 144 mg/dL — ABNORMAL HIGH (ref 0–99)
Triglycerides: 73 mg/dL (ref 0–149)
VLDL Cholesterol Cal: 12 mg/dL (ref 5–40)

## 2019-12-14 LAB — TSH: TSH: 2.8 u[IU]/mL (ref 0.450–4.500)

## 2019-12-18 ENCOUNTER — Ambulatory Visit: Payer: PPO | Admitting: Physical Therapy

## 2019-12-18 ENCOUNTER — Encounter: Payer: Self-pay | Admitting: Physical Therapy

## 2019-12-18 ENCOUNTER — Other Ambulatory Visit: Payer: Self-pay

## 2019-12-18 DIAGNOSIS — M25562 Pain in left knee: Secondary | ICD-10-CM

## 2019-12-18 DIAGNOSIS — G8929 Other chronic pain: Secondary | ICD-10-CM

## 2019-12-18 DIAGNOSIS — M6281 Muscle weakness (generalized): Secondary | ICD-10-CM

## 2019-12-18 DIAGNOSIS — R2689 Other abnormalities of gait and mobility: Secondary | ICD-10-CM

## 2019-12-18 DIAGNOSIS — R262 Difficulty in walking, not elsewhere classified: Secondary | ICD-10-CM

## 2019-12-18 DIAGNOSIS — M25561 Pain in right knee: Secondary | ICD-10-CM

## 2019-12-18 NOTE — Therapy (Signed)
[facility] [address] [place], [address] Phone: [phone]   Fax:  [phone]  Physical Therapy Treatment  Patient Details  Name: Wendy Patton MRN: [id number] Date of Birth: 10-28-1935 No data recorded  Encounter Date: 12/18/2019  PT End of Session - 12/18/19 1312    Visit Number  21    Number of Visits  25    Date for PT Re-Evaluation  12/29/19    Authorization - Visit Number  1    Authorization - Number of Visits  10    PT Start Time  0106    PT Stop Time  0145    PT Time Calculation (min)  39 min    Activity Tolerance  Patient tolerated treatment well    Behavior During Therapy  [facility] for tasks assessed/performed       Past Medical History:  Diagnosis Date  . Anemia    after jaw surgery was on iron for short period of time  . Arthritis   . Cancer (HCC)    squamous cell  . Difficult intubation    potential for difficult airway due to limited mouth opening  . Dry skin   . GERD (gastroesophageal reflux disease)    takes Aciphex daily  . Headache(784.0)    hx of migraines   . Heart murmur   . History of migraine    last one Aug 2014 and takes Imitrex daily as needed  . History of staph infection   . IBS (irritable bowel syndrome)   . Joint pain   . Joint swelling   . Laryngopharyngeal reflux   . MVP (mitral valve prolapse)   . Neuralgia   . Neuropathy    takes gabapentin daily  . PONV (postoperative nausea and vomiting)    TMJ surgery several times  . Rheumatic fever    as a child  . Shortness of breath    with exertion  . Thyroid disease     Past Surgical History:  Procedure Laterality Date  . ABDOMINAL HYSTERECTOMY  2009  . BREAST DUCTAL SYSTEM EXCISION Right 01/11/2014   Procedure: MAJOR DUCT EXCISION RIGHT BREAST;  Surgeon: Stark Klein, MD;  [facility]: [facility];  Service: General;  Laterality: Right;  . BREAST EXCISIONAL BIOPSY Right 2015   neg  . BREAST EXCISIONAL  BIOPSY Left 2010   benign papilloma removed  . BREAST EXCISIONAL BIOPSY Left 1975   neg  . BREAST SURGERY  1975, 2010   benign breast tumor  . breat excision  1975   benign breast tumor excision  . CATARACT EXTRACTION W/PHACO Right 07/15/2016   Procedure: CATARACT EXTRACTION PHACO AND INTRAOCULAR LENS PLACEMENT (IOC);  Surgeon: Estill Cotta, MD;  Location: [facility];  Service: Ophthalmology;  Laterality: Right;  Lot # [id number] H [place]: 01:38.6 AP%: 25.0 CDE: 46.89  . CATARACT EXTRACTION W/PHACO Left 09/02/2016   Procedure: CATARACT EXTRACTION PHACO AND INTRAOCULAR LENS PLACEMENT (IOC);  Surgeon: Estill Cotta, MD;  Location: [facility];  Service: Ophthalmology;  Laterality: Left;  [place] 2:05.3 AP% 24.9 CDE 51.84 Fluid pack lot # [id number] H  . cervial polyps  1999  . [place]  . COLONOSCOPY    . CYSTECTOMY  1962   vaginal cyst removal  . DILATION AND CURETTAGE OF UTERUS  1984  . ESOPHAGOGASTRODUODENOSCOPY    . granuloma  2002   chin granuloma removed  . [place], 2002, 2004  prosthesis, condyles implants, fossa replaced  . REVISION TOTAL KNEE ARTHROPLASTY    . SIGMOIDOSCOPY  1993  . SQUAMOUS CELL CARCINOMA EXCISION  2008   left ankle/skin graft  . [place]  . THYROIDECTOMY  2006   left  . TONSILLECTOMY AND ADENOIDECTOMY  1944  . TOTAL HIP ARTHROPLASTY Left 09/12/2013   Procedure: LEFT TOTAL HIP ARTHROPLASTY ANTERIOR APPROACH;  Surgeon: [name], MD;  Location: [facility];  Service: Orthopedics;  Laterality: Left;  . TOTAL HIP ARTHROPLASTY Right 04/18/2019   Procedure: RIGHT TOTAL HIP ARTHROPLASTY ANTERIOR APPROACH;  Surgeon: Melrose Nakayama, MD;  [facility];  Service: Orthopedics;  Laterality: Right;  . TOTAL HIP ARTHROPLASTY Right 08/15/2019   Procedure: TOTAL HIP ARTHROPLASTY ANTERIOR APPROACH;  Surgeon: Melrose Nakayama, MD;  [facility];  Service: Orthopedics;  Laterality: Right;  .  TRAPEZIUM RESECTION  2000, 2004   removal right & left hand trapezium  . TUBAL LIGATION    . uterine polyps  2008  . vaginal cyst removed  1962    There were no vitals filed for this visit.  Subjective Assessment - 12/18/19 1310    Subjective  Patient reports minimal pain today, some in the L knee. Is continuing to do well over all.    Pertinent History  Pt is a 84 year old female presenting s/p R THA in September 2020 with revision 08/15/19. She had HHPT for 3 weeks and has weaned off AD completely, is completing all errands and community ambulation, and driving. She reports she has good mobility and has been able to bathe, dress, and complete all ADLs. Patient reports her chief complaint of R knee pain with stiffness. Patient reports she feels her patella popping with every step of walking, with bending/stooping, lifting, and most movement of the knee. She reports this popping is painful and the knee feels unsteady. She has started using voltaran and reports this helps pain. Worst R knee pain 6/10 and 0/10 at rest. Patient has stairs to enter her home, she has stairs inside the house that she does not use, but that these are painful and she has to use a step to gait patter. Pt is anticiating a R TKA this fall. Pt denies N/V, B&B changes, unexplained weight fluctuation, saddle paresthesia, fever, night sweats, or unrelenting night pain at this time.    How long can you sit comfortably?  Not limited by RLE, but about 2 hours d/t back pain    How long can you stand comfortably?  unlimited    How long can you walk comfortably?  unlimited    Diagnostic tests  None of R knee    Patient Stated Goals  Strengthen knee so knee cap doesn't wobble    Pain Onset  1 to 4 weeks ago          Ther-Ex Nustep L4seat setting L9no UE29minswith SPM over 80 entire time with gentle strengthening used to prevent increased soreness -Stair ambulation up/down 4 steps with reciprocal pattern ascentanddescent,  with min UE support with descent,much less lateral displacement, some foot eversion and ankle ER with LLE step down -STS 19in 3 x10; with min use of UE for initiation from seat (not armrests, and for last few inches of sit Seated R hip flex x10; on theraball 2x 10  -SLR with eccentric lower w/7.5#3x 5/7/withcuing for eccentric control with good carry over -RLEOMEGA leg press12#3x 10 standing hip flexor stretch 19min hold  PT Education - 12/18/19 1311    Education Details  therex form, motor control    Person(s) Educated  Patient    Methods  Explanation;Demonstration;Verbal cues    Comprehension  Verbalized understanding;Returned demonstration;Verbal cues required       PT Short Term Goals - 11/01/19 1022      PT SHORT TERM GOAL #1   Title  Pt will be independent with HEP in order to improve strength and decrease back pain in order to improve pain-free function at home and work.    Baseline  10/03/19 HEP given    Time  4    Status  Achieved        PT Long Term Goals - 12/13/19 1339      PT LONG TERM GOAL #1   Title  Pt will increase 10MWT to at least 1.2 m/s in order to demonstrate normal  community ambulation to acheive community errancds and decrease fall risk    Baseline  10/03/19 0.8m/s 11/01/19 1.58m/s 11/27/19 1.9m/s    Time  8    Period  Weeks    Status  Achieved      PT LONG TERM GOAL #2   Title  Pt will decrease 5TSTS by at least 3 seconds in order to demonstrate clinically significant improvement in LE strength    Baseline  10/03/19 15sec 11/01/19 12.6sec 11/27/19 11.9    Time  8    Period  Weeks    Status  Achieved      PT LONG TERM GOAL #3   Title  Patinet will demonstrate reciprocal stair neogiation with unilateral handrail, 1 step/second, to demonstrate PLOF and safety negotiating home    Baseline  10/03/19 Step to leading with LLE ascent (0.91steps/sec); Step to leading with RLE descent (0.8steps/sec) 11/01/19 Reciprocal  ascent 1sec/stair with light UE support, descent step to with decreased speed 11/27/19 reciprocal ascent and descent with unilateral handrail >1sec/step, with some rotation needed for L step down    Time  8    Period  Weeks    Status  Deferred      PT LONG TERM GOAL #4   Title  Patient will increase FOTO score to 65 to demonstrate predicted increase in functional mobility to complete ADLs    Baseline  10/03/19 55 11/27/19    Time  8    Period  Weeks    Status  Deferred      PT LONG TERM GOAL #5   Title  Patient will be able to stand from her toilet height 17in with min UE use to demonstrate safety with home transfers/in community in leiu of UE support    Baseline  11/27/19 able to stand from 20in with UE needed for initation    Time  8    Period  Weeks    Status  Revised            Plan - 12/18/19 1334    Clinical Impression Statement  PT continued therex progression for increased hip flexor strength with carry over to functional movements with success. Patient is able to comply with all cuing, and is motivated throguhout session. Patient does report some increased pain through reps that subsides with rest. PT will determine d/c readiness next session.    Personal Factors and Comorbidities  Age;Behavior Pattern;Fitness;Past/Current Experience;Comorbidity 1;Comorbidity 2;Time since onset of injury/illness/exacerbation;Sex    Examination-Activity Limitations  Lift;Stand;Transfers;Carry;Sit;Squat;Stairs    Stability/Clinical Decision Making  Evolving/Moderate complexity    Clinical Decision Making  Moderate    Rehab Potential  Good    PT Duration  8 weeks    PT Treatment/Interventions  ADLs/Self Care Home Management;Electrical Stimulation;Therapeutic activities;Patient/family education;Joint Manipulations;Spinal Manipulations;Passive range of motion;Dry needling;Manual techniques;Taping;Therapeutic exercise;Iontophoresis 4mg /ml Dexamethasone;Gait training;Stair training;Neuromuscular  re-education;Functional mobility training;Ultrasound;Cryotherapy;Traction;Moist Heat    PT Next Visit Plan  motor control, glute activation, balance?    PT Home Exercise Plan  mini squat with band, bridge with band    Consulted and Agree with Plan of Care  Patient       Patient will benefit from skilled therapeutic intervention in order to improve the following deficits and impairments:  Pain, Improper body mechanics, Increased fascial restricitons, Abnormal gait, Decreased coordination, Decreased mobility, Postural dysfunction, Impaired flexibility, Difficulty walking, Decreased balance, Decreased activity tolerance, Decreased endurance, Decreased range of motion, Decreased strength  Visit Diagnosis: Chronic pain of left knee  Chronic pain of right knee  Muscle weakness (generalized)  Other abnormalities of gait and mobility  Difficulty in walking, not elsewhere classified     Problem List Patient Active Problem List   Diagnosis Date Noted  . History of revision of total replacement of right hip joint 08/15/2019  . Primary localized osteoarthritis of right hip 04/18/2019  . Primary osteoarthritis of right hip 04/18/2019  . Pectus excavatum 10/12/2017  . 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 Perimenopausal atrophic vaginitis 09/22/2016  . Menopausal hot flushes 09/22/2016  . Mitral valve prolapse 01/13/2016  . Herniation of nucleus pulposus 05/01/2015  . Hypercholesteremia 05/01/2015  . Osteoarthritis of both knees 05/01/2015  . History of migraine headaches 05/01/2015  . Esophageal reflux 01/10/2015  . S/P total hip arthroplasty 09/12/2013   Shelton Silvas PT, DPT Shelton Silvas 12/18/2019, 1:39 PM  [facility] [address], [place], [address] Phone: [phone]   Fax:  [phone]  Name: Wendy Patton MRN: [id number] Date of Birth: [birth year]

## 2019-12-22 ENCOUNTER — Ambulatory Visit: Payer: PPO | Admitting: Physical Therapy

## 2019-12-25 ENCOUNTER — Encounter: Payer: PPO | Admitting: Physical Therapy

## 2019-12-26 ENCOUNTER — Encounter: Payer: Self-pay | Admitting: Physical Therapy

## 2019-12-26 ENCOUNTER — Other Ambulatory Visit: Payer: Self-pay

## 2019-12-26 ENCOUNTER — Ambulatory Visit: Payer: PPO | Admitting: Physical Therapy

## 2019-12-26 DIAGNOSIS — M25562 Pain in left knee: Secondary | ICD-10-CM

## 2019-12-26 DIAGNOSIS — G8929 Other chronic pain: Secondary | ICD-10-CM

## 2019-12-26 DIAGNOSIS — M6281 Muscle weakness (generalized): Secondary | ICD-10-CM

## 2019-12-26 NOTE — Therapy (Signed)
[facility] [address] [place], [address] Phone: [phone]   Fax:  [phone]  Physical Therapy Treatment/Discharge Summary Reporting Period 11/27/19 - 12/26/19  Patient Details  Name: Wendy Patton MRN: [id number] Date of Birth: 06-29-1936 No data recorded  Encounter Date: 12/26/2019  PT End of Session - 12/26/19 0951    Visit Number  22    Number of Visits  25    Date for PT Re-Evaluation  12/29/19    Authorization - Visit Number  2    Authorization - Number of Visits  10    PT Start Time  0945    PT Stop Time  1030    PT Time Calculation (min)  45 min    Activity Tolerance  Patient tolerated treatment well    Behavior During Therapy  [facility] for tasks assessed/performed       Past Medical History:  Diagnosis Date  . Anemia    after jaw surgery was on iron for short period of time  . Arthritis   . Cancer (HCC)    squamous cell  . Difficult intubation    potential for difficult airway due to limited mouth opening  . Dry skin   . GERD (gastroesophageal reflux disease)    takes Aciphex daily  . Headache(784.0)    hx of migraines   . Heart murmur   . History of migraine    last one Aug 2014 and takes Imitrex daily as needed  . History of staph infection   . IBS (irritable bowel syndrome)   . Joint pain   . Joint swelling   . Laryngopharyngeal reflux   . MVP (mitral valve prolapse)   . Neuralgia   . Neuropathy    takes gabapentin daily  . PONV (postoperative nausea and vomiting)    TMJ surgery several times  . Rheumatic fever    as a child  . Shortness of breath    with exertion  . Thyroid disease     Past Surgical History:  Procedure Laterality Date  . ABDOMINAL HYSTERECTOMY  2009  . BREAST DUCTAL SYSTEM EXCISION Right 01/11/2014   Procedure: MAJOR DUCT EXCISION RIGHT BREAST;  Surgeon: Stark Klein, MD;  Location: [facility];  Service: General;  Laterality: Right;  . BREAST  EXCISIONAL BIOPSY Right 2015   neg  . BREAST EXCISIONAL BIOPSY Left 2010   benign papilloma removed  . BREAST EXCISIONAL BIOPSY Left 1975   neg  . BREAST SURGERY  1975, 2010   benign breast tumor  . breat excision  1975   benign breast tumor excision  . CATARACT EXTRACTION W/PHACO Right 07/15/2016   Procedure: CATARACT EXTRACTION PHACO AND INTRAOCULAR LENS PLACEMENT (IOC);  Surgeon: Estill Cotta, MD;  Location: [facility];  Service: Ophthalmology;  Laterality: Right;  Lot # [id number] H [place]: 01:38.6 AP%: 25.0 CDE: 46.89  . CATARACT EXTRACTION W/PHACO Left 09/02/2016   Procedure: CATARACT EXTRACTION PHACO AND INTRAOCULAR LENS PLACEMENT (IOC);  Surgeon: Estill Cotta, MD;  Location: [facility];  Service: Ophthalmology;  Laterality: Left;  [place] 2:05.3 AP% 24.9 CDE 51.84 Fluid pack lot # [id number]  . cervial polyps  1999  . [place]  . COLONOSCOPY    . CYSTECTOMY  1962   vaginal cyst removal  . DILATION AND CURETTAGE OF UTERUS  1984  . ESOPHAGOGASTRODUODENOSCOPY    . granuloma  2002   chin granuloma removed  . [place]  1980, 1984,  1986, 1993, 2002, 2004   prosthesis, condyles implants, fossa replaced  . REVISION TOTAL KNEE ARTHROPLASTY    . SIGMOIDOSCOPY  1993  . SQUAMOUS CELL CARCINOMA EXCISION  2008   left ankle/skin graft  . [place]  . THYROIDECTOMY  2006   left  . TONSILLECTOMY AND ADENOIDECTOMY  1944  . TOTAL HIP ARTHROPLASTY Left 09/12/2013   Procedure: LEFT TOTAL HIP ARTHROPLASTY ANTERIOR APPROACH;  Surgeon: [name], MD;  Location: [facility];  Service: Orthopedics;  Laterality: Left;  . TOTAL HIP ARTHROPLASTY Right 04/18/2019   Procedure: RIGHT TOTAL HIP ARTHROPLASTY ANTERIOR APPROACH;  Surgeon: Melrose Nakayama, MD;  Location: [facility];  Service: Orthopedics;  Laterality: Right;  . TOTAL HIP ARTHROPLASTY Right 08/15/2019   Procedure: TOTAL HIP ARTHROPLASTY ANTERIOR APPROACH;  Surgeon: Melrose Nakayama, MD;   Location: [facility];  Service: Orthopedics;  Laterality: Right;  . TRAPEZIUM RESECTION  2000, 2004   removal right & left hand trapezium  . TUBAL LIGATION    . uterine polyps  2008  . vaginal cyst removed  1962    There were no vitals filed for this visit.  Subjective Assessment - 12/26/19 0950    Subjective  Patient had a fall over the weekend, got tangled in her vacuum. She reports some bruising, but no broken bones, and denies hitting her head. Compliance with HEP, and minimal bilat knee pain.    Pertinent History  Pt is a 84 year old female presenting s/p R THA in September 2020 with revision 08/15/19. She had HHPT for 3 weeks and has weaned off AD completely, is completing all errands and community ambulation, and driving. She reports she has good mobility and has been able to bathe, dress, and complete all ADLs. Patient reports her chief complaint of R knee pain with stiffness. Patient reports she feels her patella popping with every step of walking, with bending/stooping, lifting, and most movement of the knee. She reports this popping is painful and the knee feels unsteady. She has started using voltaran and reports this helps pain. Worst R knee pain 6/10 and 0/10 at rest. Patient has stairs to enter her home, she has stairs inside the house that she does not use, but that these are painful and she has to use a step to gait patter. Pt is anticiating a R TKA this fall. Pt denies N/V, B&B changes, unexplained weight fluctuation, saddle paresthesia, fever, night sweats, or unrelenting night pain at this time.    How long can you sit comfortably?  Not limited by RLE, but about 2 hours d/t back pain    How long can you stand comfortably?  unlimited    How long can you walk comfortably?  unlimited    Diagnostic tests  None of R knee    Patient Stated Goals  Strengthen knee so knee cap doesn't wobble    Pain Onset  1 to 4 weeks ago       Ther-Ex Nustep L4seat setting L9no UE29mnswith SPM over  80 entire time with gentle strengthening used to prevent increased soreness PT led patient through the following therex with min cuing needed for technique, good independence overall. Patient verbalizes understanding of all provided education on parameters for maintaining strengthening gains.  Access Code: [email or web address].Sit to Stand without Arm Support - 1 x daily - 2-3 x weekly - 3 sets - 10 reps .Straight Leg Raise with Ankle Weight - 1 x daily - 2-3 x weekly - 3 sets -  10 reps .Step Up - 1 x daily - 2-3 x weekly - 3 sets - 10 reps .Lateral Step Up - 1 x daily - 2-3 x weekly - 3 sets - 10 reps .Supine Bridge with Resistance Band - 1 x daily - 2-3 x weekly - 3 sets - 10 reps .Squat with Chair Touch and Resistance Loop - 1 x daily - 2-3 x weekly - 3 sets - 10 reps .Standing Marching - 1 x daily - 2-3 x weekly - 3 sets - 10 reps .Modified Thomas Stretch - 1 x daily - 7 x weekly - 60sec hold .Standing Hip Flexor Stretch - 1 x daily - 7 x weekly - 60sec hold                           PT Education - 12/26/19 1113    Education Details  therex form, HEP recommendations    Person(s) Educated  Patient    Methods  Explanation;Demonstration;Verbal cues;Handout    Comprehension  Verbalized understanding;Returned demonstration;Verbal cues required       PT Short Term Goals - 11/01/19 1022      PT SHORT TERM GOAL #1   Title  Pt will be independent with HEP in order to improve strength and decrease back pain in order to improve pain-free function at home and work.    Baseline  10/03/19 HEP given    Time  4    Status  Achieved        PT Long Term Goals - 12/26/19 0952      PT LONG TERM GOAL #1   Title  Pt will increase 10MWT to at least 1.2 m/s in order to demonstrate normal  community ambulation to acheive community errancds and decrease fall risk    Baseline  10/03/19 0.58ms 11/01/19 1.140m 11/27/19 1.2253m   Time  8    Period  Weeks    Status  Achieved      PT LONG TERM  GOAL #2   Title  Pt will decrease 5TSTS by at least 3 seconds in order to demonstrate clinically significant improvement in LE strength    Baseline  10/03/19 15sec 11/01/19 12.6sec 11/27/19 11.9    Time  8    Period  Weeks    Status  Achieved      PT LONG TERM GOAL #3   Title  Patinet will demonstrate reciprocal stair neogiation with unilateral handrail, 1 step/second, to demonstrate PLOF and safety negotiating home    Baseline  10/03/19 Step to leading with LLE ascent (0.91steps/sec); Step to leading with RLE descent (0.8steps/sec) 11/01/19 Reciprocal ascent 1sec/stair with light UE support, descent step to with decreased speed 11/27/19 reciprocal ascent and descent with unilateral handrail >1sec/step, with some rotation needed for L step down; 12/26/19 reciprocol pattern with no UE support with some increased lateral displacement 0.6sec/stair    Time  8    Period  Weeks    Status  Achieved      PT LONG TERM GOAL #4   Title  Patient will increase FOTO score to 65 to demonstrate predicted increase in functional mobility to complete ADLs    Baseline  10/03/19 55  12/26/19 72    Time  8    Period  Weeks    Status  Achieved      PT LONG TERM GOAL #5   Title  Patient will be able to stand from her toilet height 17in  with min UE use to demonstrate safety with home transfers/in community in leiu of UE support    Baseline  11/27/19 able to stand from 20in with UE needed for initation; 12/26/19 able to stand from 17in toilet with UE push off from commode modI without external support    Time  8    Period  Weeks    Status  Achieved            Plan - 12/26/19 1132    Clinical Impression Statement  PT reassessed goals, where patient met/exceeded all goals at this time. Patient is able to demonstrate and verbalize understanding of HEP recommendations to maintain gains made throughout PT. Patient given clinic contact infor should any other questions/concerns arise.    Personal Factors and Comorbidities   Age;Behavior Pattern;Fitness;Past/Current Experience;Comorbidity 1;Comorbidity 2;Time since onset of injury/illness/exacerbation;Sex    Examination-Activity Limitations  Lift;Stand;Transfers;Carry;Sit;Squat;Stairs    Examination-Participation Restrictions  Church    Stability/Clinical Decision Making  Evolving/Moderate complexity    Clinical Decision Making  Moderate    Rehab Potential  Good    PT Frequency  2x / week    PT Duration  8 weeks    PT Treatment/Interventions  ADLs/Self Care Home Management;Electrical Stimulation;Therapeutic activities;Patient/family education;Joint Manipulations;Spinal Manipulations;Passive range of motion;Dry needling;Manual techniques;Taping;Therapeutic exercise;Iontophoresis 51m/ml Dexamethasone;Gait training;Stair training;Neuromuscular re-education;Functional mobility training;Ultrasound;Cryotherapy;Traction;Moist Heat    PT Next Visit Plan  motor control, glute activation, balance?    PT Home Exercise Plan  mini squat with band, bridge with band    Consulted and Agree with Plan of Care  Patient       Patient will benefit from skilled therapeutic intervention in order to improve the following deficits and impairments:  Pain, Improper body mechanics, Increased fascial restricitons, Abnormal gait, Decreased coordination, Decreased mobility, Postural dysfunction, Impaired flexibility, Difficulty walking, Decreased balance, Decreased activity tolerance, Decreased endurance, Decreased range of motion, Decreased strength  Visit Diagnosis: Chronic pain of left knee  Chronic pain of right knee  Muscle weakness (generalized)     Problem List Patient Active Problem List   Diagnosis Date Noted  . History of revision of total replacement of right hip joint 08/15/2019  . Primary localized osteoarthritis of right hip 04/18/2019  . Primary osteoarthritis of right hip 04/18/2019  . Pectus excavatum 10/12/2017  . 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 Perimenopausal  atrophic vaginitis 09/22/2016  . Menopausal hot flushes 09/22/2016  . Mitral valve prolapse 01/13/2016  . Herniation of nucleus pulposus 05/01/2015  . Hypercholesteremia 05/01/2015  . Osteoarthritis of both knees 05/01/2015  . History of migraine headaches 05/01/2015  . Esophageal reflux 01/10/2015  . S/P total hip arthroplasty 09/12/2013   CShelton SilvasPT, DPT CShelton Silvas5/25/2021, 11:48 AM  [facility] [address] [place] [address]Phone: [phone]  Fax:  [phone] Name: JESMIRNA RAVANMRN: [id number] of Birth: 911-27-1937

## 2019-12-29 ENCOUNTER — Ambulatory Visit: Payer: PPO | Admitting: Physical Therapy

## 2020-01-03 ENCOUNTER — Ambulatory Visit: Payer: PPO | Admitting: Physical Therapy

## 2020-01-05 ENCOUNTER — Ambulatory Visit: Payer: PPO | Admitting: Physical Therapy

## 2020-01-08 ENCOUNTER — Encounter: Payer: PPO | Admitting: Physical Therapy

## 2020-01-12 ENCOUNTER — Encounter: Payer: PPO | Admitting: Physical Therapy

## 2020-01-15 ENCOUNTER — Encounter: Payer: PPO | Admitting: Physical Therapy

## 2020-01-19 ENCOUNTER — Encounter: Payer: PPO | Admitting: Physical Therapy

## 2020-01-22 ENCOUNTER — Encounter: Payer: PPO | Admitting: Physical Therapy

## 2020-01-26 ENCOUNTER — Encounter: Payer: PPO | Admitting: Physical Therapy

## 2020-01-29 ENCOUNTER — Encounter: Payer: PPO | Admitting: Physical Therapy

## 2020-02-08 DIAGNOSIS — H26492 Other secondary cataract, left eye: Secondary | ICD-10-CM | POA: Diagnosis not present

## 2020-02-08 DIAGNOSIS — H43813 Vitreous degeneration, bilateral: Secondary | ICD-10-CM | POA: Diagnosis not present

## 2020-02-14 DIAGNOSIS — H903 Sensorineural hearing loss, bilateral: Secondary | ICD-10-CM | POA: Diagnosis not present

## 2020-02-14 DIAGNOSIS — H6123 Impacted cerumen, bilateral: Secondary | ICD-10-CM | POA: Diagnosis not present

## 2020-03-11 NOTE — Progress Notes (Signed)
Established patient visit   Patient: Wendy Patton   DOB: 05-13-1936   84 y.o. Female  MRN: [id number] Visit Date: 03/12/2020  Today's healthcare provider: Trinna Post, PA-C   Chief Complaint  Patient presents with  . Urinary Frequency  I,Porsha C McClurkin,acting as a scribe for Trinna Post, PA-C.,have documented all relevant documentation on the behalf of Trinna Post, PA-C,as directed by  Trinna Post, PA-C while in the presence of Trinna Post, PA-C.  Subjective    Urinary Frequency  This is a recurrent problem. The current episode started more than 1 month ago. The problem has been unchanged. The pain is at a severity of 0/10. The patient is experiencing no pain. There has been no fever. Associated symptoms include frequency and urgency. Pertinent negatives include no discharge or hesitancy. She has tried nothing for the symptoms. The treatment provided no relief. Her past medical history is significant for recurrent UTIs.  Patient reports she has incontinence at times.   Patient was historically on hormone replacement surgery until hip repair several months ago. She was discontinued from hormones during peripoerative period and then did not resume them. She did not have resurgence of urinary symptoms until recently. She has a history of congenital ichthyosis and reports allergy to vaginal cream. She does not have a uterus.    Hypertension, follow-up  BP Readings from Last 3 Encounters:  03/12/20 138/84  12/13/19 132/88  08/15/19 (!) 156/75   Wt Readings from Last 3 Encounters:  03/12/20 134 lb 3.2 oz (60.9 kg)  12/13/19 133 lb 6.4 oz (60.5 kg)  08/15/19 131 lb 6.4 oz (59.6 kg)     She was last seen for hypertension 6 months ago.  Patient reports blood pressures have been elevated at home. Her blood pressures are typically elevated in office but until today patient reports they are normal at home. Today, she reports they are now elevated.     Use of agents associated with hypertension: none.   Outside blood pressures are slightly elevated. Symptoms: No chest pain No chest pressure  No palpitations No syncope  No dyspnea No orthopnea  No paroxysmal nocturnal dyspnea No lower extremity edema   Pertinent labs: Lab Results  Component Value Date   CHOL 255 (H) 12/13/2019   HDL 99 12/13/2019   LDLCALC 144 (H) 12/13/2019   TRIG 73 12/13/2019   CHOLHDL 2.6 12/13/2019   Lab Results  Component Value Date   NA 136 12/13/2019   K 4.6 12/13/2019   CREATININE 1.04 (H) 12/13/2019   GFRNONAA 51 (L) 12/13/2019   GFRAA 58 (L) 12/13/2019   GLUCOSE 98 12/13/2019     The ASCVD Risk score (Goff DC Jr., et al., 2013) failed to calculate for the following reasons:   The 2013 ASCVD risk score is only valid for ages 30 to 98   ---------------------------------------------------------------------------------------------------      Medications: Outpatient Medications Prior to Visit  Medication Sig  . Ammonium Lactate (AMLACTIN EX) Apply 1 application topically 4 (four) times daily.  . Calcium Carb-Cholecalciferol (CALCIUM 600+D) 600-800 MG-UNIT TABS Take 1 tablet by mouth 2 (two) times daily.  . cholecalciferol (VITAMIN D3) 25 MCG (1000 UNIT) tablet Take 1,000 Units by mouth daily.  . cycloSPORINE (RESTASIS) 0.05 % ophthalmic emulsion Place 1 drop into both eyes 2 (two) times daily.   . diphenhydrAMINE (BENADRYL) 25 mg capsule Take 25 mg by mouth at bedtime as needed.  . Glucosamine HCl 1000 MG  TABS Take 1,000 mg by mouth daily.  . Omega-3 1000 MG CAPS Take 1,000 mg by mouth daily.   . pantoprazole (PROTONIX) 40 MG tablet Take 1 tablet (40 mg total) by mouth 2 (two) times daily.  Vladimir Faster Glycol-Propyl Glycol (SYSTANE OP) Place 1 drop into both eyes 2 (two) times daily as needed (dry eyes).   . sodium chloride (MURO 128) 5 % ophthalmic solution Place 1 drop into both eyes as needed (severe dry eyes).  . vitamin B-12  (CYANOCOBALAMIN) 1000 MCG tablet Take 1,000 mcg by mouth daily.  . vitamin E 1000 UNIT capsule Take 1,000 Units by mouth daily.    Marland Kitchen acetaminophen (TYLENOL) 650 MG CR tablet Take 650 mg by mouth 2 (two) times daily as needed for pain. (Patient not taking: Reported on 03/12/2020)   No facility-administered medications prior to visit.    Review of Systems  Genitourinary: Positive for frequency and urgency. Negative for hesitancy.      Objective    BP 138/84 (BP Location: Left Arm, Patient Position: Sitting, Cuff Size: Normal)   Pulse 77   Temp 98 F (36.7 C) (Oral)   Wt 134 lb 3.2 oz (60.9 kg)   SpO2 95%   BMI 20.41 kg/m    Physical Exam Constitutional:      Appearance: Normal appearance.  Cardiovascular:     Rate and Rhythm: Normal rate and regular rhythm.     Heart sounds: Normal heart sounds.  Pulmonary:     Effort: Pulmonary effort is normal.     Breath sounds: Normal breath sounds.  Skin:    General: Skin is warm and dry.  Neurological:     Mental Status: She is alert and oriented to person, place, and time. Mental status is at baseline.  Psychiatric:        Mood and Affect: Mood normal.        Behavior: Behavior normal.       No results found for any visits on 03/12/20.  Assessment & Plan    1. Urinary frequency  Counseled on multiple causes of these symptoms including post menopausal vaginal atrophy, bladder prolapse, overactive bladder. Will check urine as below  - POCT urinalysis dipstick - Urine Microscopic - Urine Culture  2. Hypertension, unspecified type  Her BP is actually normal in office today. Prescribed as below as patient reports elevated blood pressure at home.   - amLODipine (NORVASC) 2.5 MG tablet; Take 1 tablet (2.5 mg total) by mouth daily.  Dispense: 90 tablet; Refill: 3  3. Hormone replacement therapy (HRT)  Patient has been counseled extensively on risk of hormone replacement therapy including heart attack, blood clot, stroke,  increased risk of breast cancer which are accentuated by her age and comorbidities. Patient reports inability to take vaginal estrogen due to congenital skin condition and allergies and feels her symptoms are intolerable and greatly detract from her quality of life. She is accepting of these risks and wishes to resume HRT. Can take estrogen 1 mg every other day. Will place mammogram order as well.   - estradiol (ESTRACE) 1 MG tablet; Take 1 tablet (1 mg total) by mouth daily.  Dispense: 90 tablet; Refill: 0    No follow-ups on file.      ITrinna Post, PA-C, have reviewed all documentation for this visit. The documentation on 03/21/20 for the exam, diagnosis, procedures, and orders are all accurate and complete.  The entirety of the information documented in the History of Present  Illness, Review of Systems and Physical Exam were personally obtained by me. Portions of this information were initially documented by Wilburt Finlay, CMA and reviewed by me for thoroughness and accuracy.        Paulene Floor  [facility] [phone] (phone) [phone] (fax)  [place]

## 2020-03-12 ENCOUNTER — Ambulatory Visit (INDEPENDENT_AMBULATORY_CARE_PROVIDER_SITE_OTHER): Payer: PPO | Admitting: Physician Assistant

## 2020-03-12 ENCOUNTER — Other Ambulatory Visit: Payer: Self-pay

## 2020-03-12 ENCOUNTER — Encounter: Payer: Self-pay | Admitting: Physician Assistant

## 2020-03-12 VITALS — BP 138/84 | HR 77 | Temp 98.0°F | Wt 134.2 lb

## 2020-03-12 DIAGNOSIS — Z79890 Hormone replacement therapy (postmenopausal): Secondary | ICD-10-CM | POA: Diagnosis not present

## 2020-03-12 DIAGNOSIS — I10 Essential (primary) hypertension: Secondary | ICD-10-CM

## 2020-03-12 DIAGNOSIS — R350 Frequency of micturition: Secondary | ICD-10-CM | POA: Diagnosis not present

## 2020-03-12 LAB — POCT URINALYSIS DIPSTICK
Bilirubin, UA: NEGATIVE
Glucose, UA: NEGATIVE
Ketones, UA: NEGATIVE
Leukocytes, UA: NEGATIVE
Nitrite, UA: NEGATIVE
Protein, UA: NEGATIVE
Spec Grav, UA: 1.015 (ref 1.010–1.025)
Urobilinogen, UA: 0.2 E.U./dL
pH, UA: 6.5 (ref 5.0–8.0)

## 2020-03-12 MED ORDER — AMLODIPINE BESYLATE 2.5 MG PO TABS: 2.5000 mg | ORAL_TABLET | Freq: Every day | ORAL | 3 refills | Status: DC

## 2020-03-12 MED ORDER — ESTRADIOL 1 MG PO TABS: 1.0000 mg | ORAL_TABLET | Freq: Every day | ORAL | 0 refills | Status: DC

## 2020-03-13 LAB — URINALYSIS, MICROSCOPIC ONLY
Bacteria, UA: NONE SEEN
Casts: NONE SEEN /lpf
RBC, Urine: NONE SEEN /hpf (ref 0–2)
WBC, UA: NONE SEEN /hpf (ref 0–5)

## 2020-03-17 LAB — URINE CULTURE

## 2020-03-21 ENCOUNTER — Encounter: Payer: Self-pay | Admitting: Physician Assistant

## 2020-03-21 ENCOUNTER — Other Ambulatory Visit: Payer: Self-pay | Admitting: Physician Assistant

## 2020-03-21 DIAGNOSIS — Z1231 Encounter for screening mammogram for malignant neoplasm of breast: Secondary | ICD-10-CM

## 2020-03-30 IMAGING — CR DG CHEST 1V
1 series · 1 of 1 positions shown · non-contrast
Comparison: 04/13/2019

CLINICAL DATA: Fall, right hip pain

EXAM:
CHEST  1 VIEW

[dg chest 1 view]
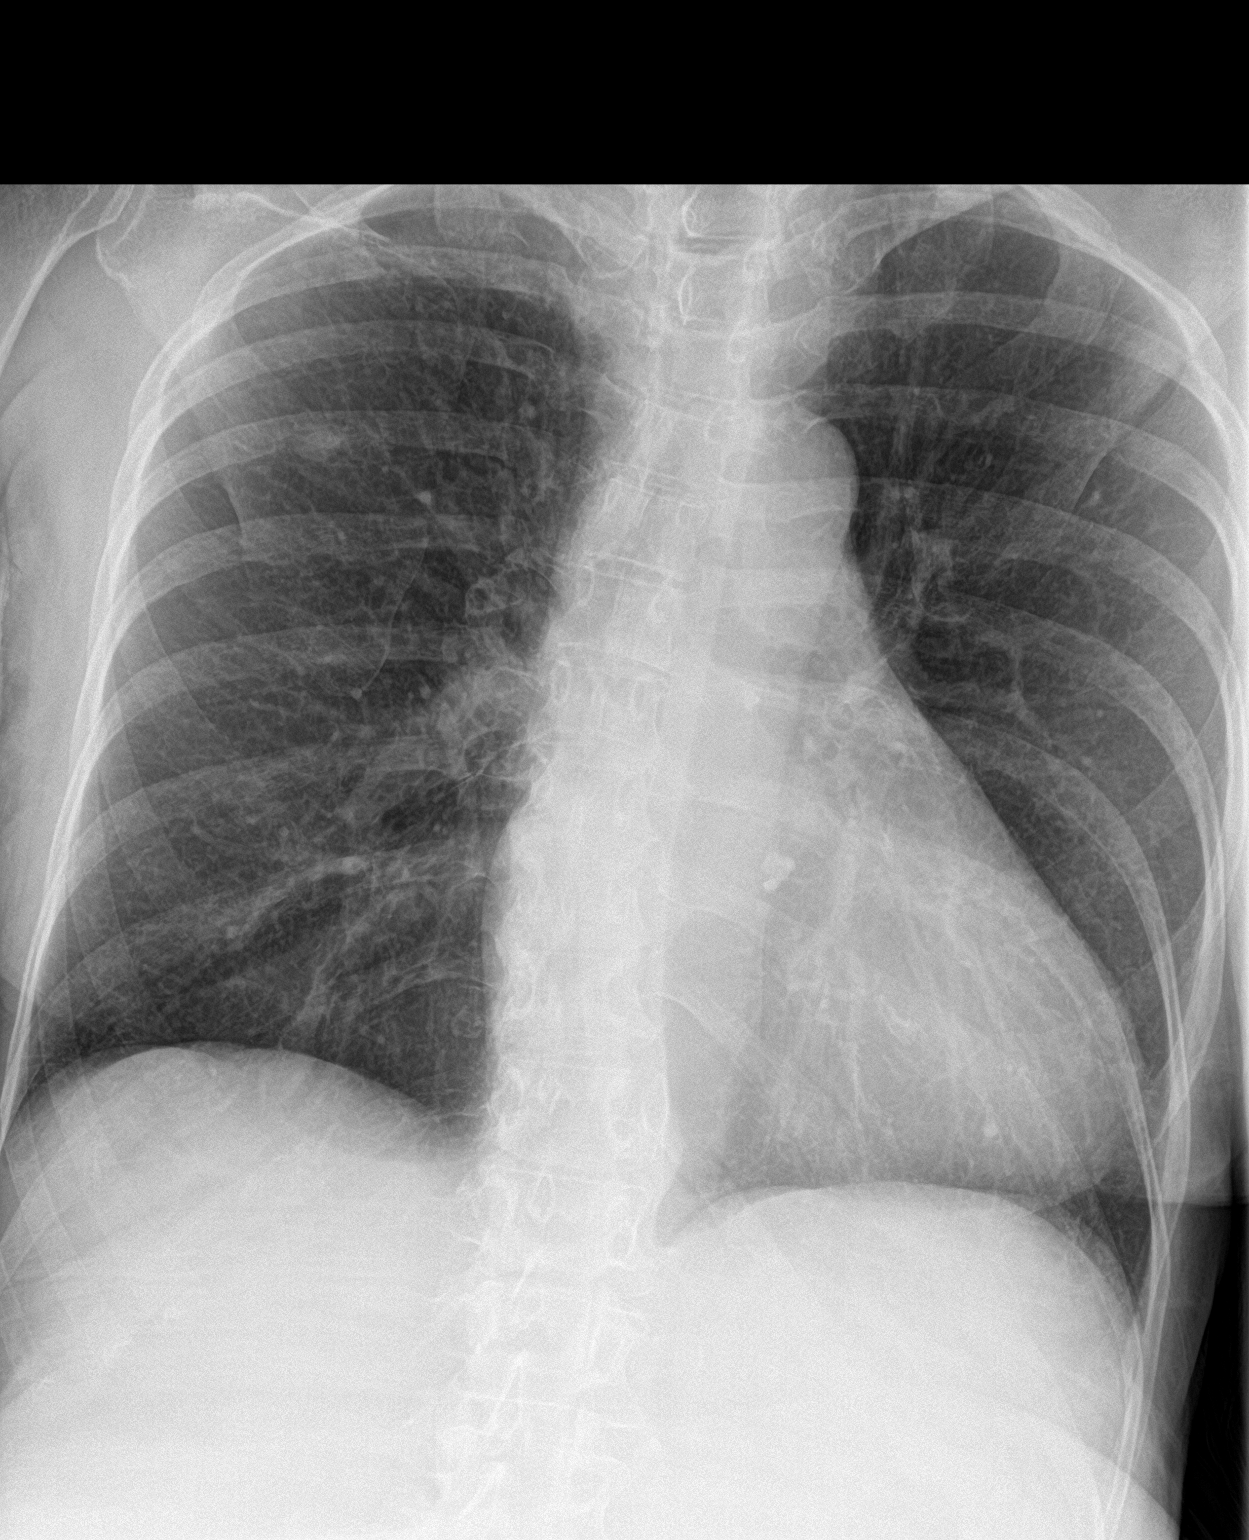

[1 of 1 positions shown; findings below may reference images not displayed]

FINDINGS: Stable right upper lobe nodule, chronic dating back to 2161, benign.
Left lung is clear. No pleural effusion or pneumothorax.

The heart is normal in size.
IMPRESSION: No evidence of acute cardiopulmonary disease.

## 2020-03-30 IMAGING — DX DG PORTABLE PELVIS
1 series · 1 of 1 positions shown · non-contrast
Comparison: None.

CLINICAL DATA: Post reduction RIGHT hip dislocation.

EXAM:
PORTABLE PELVIS 1-2 VIEWS

[pelvis ap]
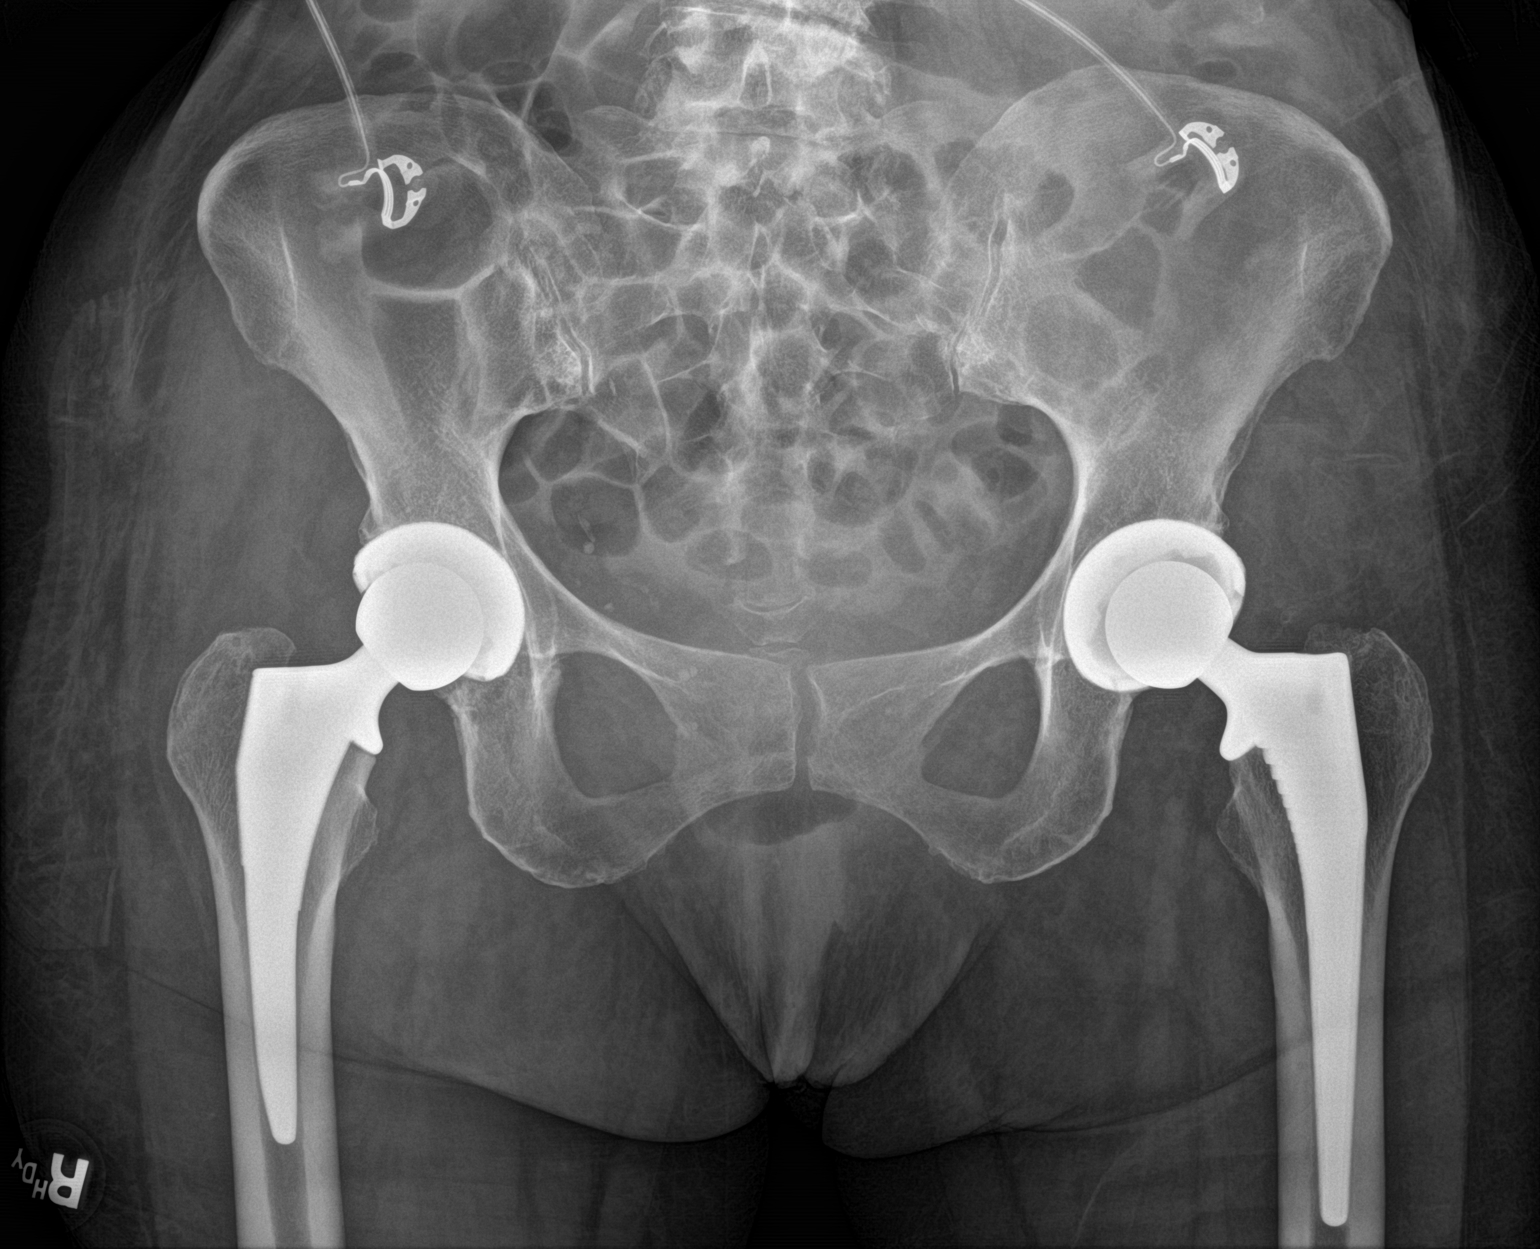

[1 of 1 positions shown; findings below may reference images not displayed]

FINDINGS: Single AP view of the pelvis shows bilateral hip arthroplasties.
Hardware appears appropriately positioned. Osseous alignment is
anatomic.
IMPRESSION: Arthroplasty hardware appears appropriately positioned.

## 2020-04-06 ENCOUNTER — Encounter: Payer: Self-pay | Admitting: Physician Assistant

## 2020-04-09 DIAGNOSIS — R269 Unspecified abnormalities of gait and mobility: Secondary | ICD-10-CM | POA: Diagnosis not present

## 2020-04-10 ENCOUNTER — Encounter: Payer: Self-pay | Admitting: Physician Assistant

## 2020-04-10 ENCOUNTER — Ambulatory Visit
Admission: RE | Admit: 2020-04-10 | Discharge: 2020-04-10 | Disposition: A | Discharge status: Home / Self Care | Payer: PPO | Source: Ambulatory Visit | Attending: Physician Assistant | Admitting: Physician Assistant

## 2020-04-10 ENCOUNTER — Other Ambulatory Visit: Payer: Self-pay

## 2020-04-10 ENCOUNTER — Ambulatory Visit (INDEPENDENT_AMBULATORY_CARE_PROVIDER_SITE_OTHER): Payer: PPO | Admitting: Physician Assistant

## 2020-04-10 VITALS — BP 169/84 | HR 78 | Temp 98.3°F | Wt 134.4 lb

## 2020-04-10 DIAGNOSIS — R2681 Unsteadiness on feet: Secondary | ICD-10-CM

## 2020-04-10 DIAGNOSIS — R42 Dizziness and giddiness: Secondary | ICD-10-CM | POA: Diagnosis not present

## 2020-04-10 DIAGNOSIS — Z1231 Encounter for screening mammogram for malignant neoplasm of breast: Secondary | ICD-10-CM | POA: Insufficient documentation

## 2020-04-10 DIAGNOSIS — Z79890 Hormone replacement therapy (postmenopausal): Secondary | ICD-10-CM | POA: Diagnosis not present

## 2020-04-10 DIAGNOSIS — I10 Essential (primary) hypertension: Secondary | ICD-10-CM | POA: Diagnosis not present

## 2020-04-10 MED ORDER — LOSARTAN POTASSIUM 25 MG PO TABS: 25.0000 mg | ORAL_TABLET | Freq: Every day | ORAL | 0 refills | Status: DC

## 2020-04-10 NOTE — Progress Notes (Signed)
Established patient visit   Patient: Wendy Patton   DOB: 03/08/1936   84 y.o. Female  MRN: [id number] Visit Date: 04/10/2020  Today's healthcare provider: Trinna Post, PA-C   Chief Complaint  Patient presents with  . Dizziness  I,Porsha C McClurkin,acting as a scribe for Trinna Post, PA-C.,have documented all relevant documentation on the behalf of Trinna Post, PA-C,as directed by  Trinna Post, PA-C while in the presence of Trinna Post, PA-C.  Subjective    Dizziness This is a new problem. The current episode started more than 1 month ago. The problem occurs daily. The problem has been unchanged. Associated symptoms include weakness. Pertinent negatives include no chest pain, fatigue, headaches, nausea, numbness, vertigo or visual change. The symptoms are aggravated by standing, twisting and bending. She has tried nothing for the symptoms.  Patient reports she noticed her balance is off and her blood pressure elevated lately.   Hypertension, follow-up  BP Readings from Last 3 Encounters:  04/10/20 (!) 169/84  03/12/20 138/84  12/13/19 132/88   Wt Readings from Last 3 Encounters:  04/10/20 134 lb 6.4 oz (61 kg)  03/12/20 134 lb 3.2 oz (60.9 kg)  12/13/19 133 lb 6.4 oz (60.5 kg)     She was last seen for hypertension 1 months ago.  BP at that visit was elevated. Management since that visit includes starting amlodipine 2.5 mg QD.  She reports poor compliance with treatment. Did not start treatment because she recalls amlodipine historically causing ankle swelling She is following a Regular diet. She is exercising. She does not smoke.  Use of agents associated with hypertension: estrogens.   Outside blood pressures are elevated. Symptoms: No chest pain No chest pressure  No palpitations No syncope  No dyspnea No orthopnea  No paroxysmal nocturnal dyspnea No lower extremity edema   Pertinent labs: Lab Results  Component Value Date    CHOL 255 (H) 12/13/2019   HDL 99 12/13/2019   LDLCALC 144 (H) 12/13/2019   TRIG 73 12/13/2019   CHOLHDL 2.6 12/13/2019   Lab Results  Component Value Date   NA 136 12/13/2019   K 4.6 12/13/2019   CREATININE 1.04 (H) 12/13/2019   GFRNONAA 51 (L) 12/13/2019   GFRAA 58 (L) 12/13/2019   GLUCOSE 98 12/13/2019     The ASCVD Risk score (Goff DC Jr., et al., 2013) failed to calculate for the following reasons:   The 2013 ASCVD risk score is only valid for ages 53 to 67   ---------------------------------------------------------------------------------------------------  She has had a couple of falls in the past month. She feels dizzy when she turns around quickly. One time she fell into a groove in the ground and fell. She does have neuropathy in bilateral feet and does not feel very well. She reports she has to be extra cautious in watching her footing when she is walking.       Medications: Outpatient Medications Prior to Visit  Medication Sig  . amLODipine (NORVASC) 2.5 MG tablet Take 1 tablet (2.5 mg total) by mouth daily.  . Ammonium Lactate (AMLACTIN EX) Apply 1 application topically 4 (four) times daily.  . Calcium Carb-Cholecalciferol (CALCIUM 600+D) 600-800 MG-UNIT TABS Take 1 tablet by mouth 2 (two) times daily.  . cholecalciferol (VITAMIN D3) 25 MCG (1000 UNIT) tablet Take 1,000 Units by mouth daily.  . cycloSPORINE (RESTASIS) 0.05 % ophthalmic emulsion Place 1 drop into both eyes 2 (two) times daily.   . diphenhydrAMINE (  BENADRYL) 25 mg capsule Take 25 mg by mouth at bedtime as needed.  Marland Kitchen estradiol (ESTRACE) 1 MG tablet Take 1 tablet (1 mg total) by mouth daily.  . Glucosamine HCl 1000 MG TABS Take 1,000 mg by mouth daily.  . Omega-3 1000 MG CAPS Take 1,000 mg by mouth daily.   . pantoprazole (PROTONIX) 40 MG tablet Take 1 tablet (40 mg total) by mouth 2 (two) times daily.  Vladimir Faster Glycol-Propyl Glycol (SYSTANE OP) Place 1 drop into both eyes 2 (two) times daily as  needed (dry eyes).   . sodium chloride (MURO 128) 5 % ophthalmic solution Place 1 drop into both eyes as needed (severe dry eyes).  . vitamin B-12 (CYANOCOBALAMIN) 1000 MCG tablet Take 1,000 mcg by mouth daily.  . vitamin E 1000 UNIT capsule Take 1,000 Units by mouth daily.    Marland Kitchen acetaminophen (TYLENOL) 650 MG CR tablet Take 650 mg by mouth 2 (two) times daily as needed for pain. (Patient not taking: Reported on 03/12/2020)   No facility-administered medications prior to visit.    Review of Systems  Constitutional: Negative for fatigue.  Respiratory: Negative for chest tightness.   Cardiovascular: Negative for chest pain.  Gastrointestinal: Negative for nausea.  Neurological: Positive for dizziness, weakness and light-headedness. Negative for vertigo, numbness and headaches.      Objective    BP (!) 173/78 (BP Location: Left Arm, Patient Position: Sitting, Cuff Size: Normal)   Pulse 76   Temp 98.3 F (36.8 C) (Oral)   Wt 134 lb 6.4 oz (61 kg)   SpO2 96%   BMI 20.44 kg/m    Physical Exam Constitutional:      Appearance: Normal appearance.  Cardiovascular:     Rate and Rhythm: Normal rate and regular rhythm.     Heart sounds: Normal heart sounds.  Pulmonary:     Effort: Pulmonary effort is normal.     Breath sounds: Normal breath sounds.  Skin:    General: Skin is warm and dry.  Neurological:     Mental Status: She is alert and oriented to person, place, and time. Mental status is at baseline.  Psychiatric:        Mood and Affect: Mood normal.        Behavior: Behavior normal.       No results found for any visits on 04/10/20.  Assessment & Plan    1. Essential hypertension  Elevated, start as below. We have reviewed compounded risk of HRT in context of CVD. Patient understands risks. Patient has recurrent UTI and bladder symptoms related to estrogen deficiency and she is not able to tolerate any other formulation of estrogen due to chronic skin condition.   -  losartan (COZAAR) 25 MG tablet; Take 1 tablet (25 mg total) by mouth daily.  Dispense: 30 tablet; Refill: 0  2. Dizziness  EKG normal. Discussed contributors to balance including proprioception which can be negatively impacted by neuropathy. Referral to PT>   - EKG 12-Lead - Ambulatory referral to Physical Therapy  3. Unsteady gait   4. Hormone replacement therapy     No follow-ups on file.      ITrinna Post, PA-C, have reviewed all documentation for this visit. The documentation on 04/16/20 for the exam, diagnosis, procedures, and orders are all accurate and complete.  The entirety of the information documented in the History of Present Illness, Review of Systems and Physical Exam were personally obtained by me. Portions of this information were  initially documented by [facility] and reviewed by me for thoroughness and accuracy.     Paulene Floor   [facility] [phone] (phone) [phone] (fax)  [place]

## 2020-04-16 ENCOUNTER — Encounter: Payer: Self-pay | Admitting: Physician Assistant

## 2020-04-17 NOTE — Telephone Encounter (Signed)
Pt states that referral was supposed to go to [facility] [place] on [address] Fax [phone] instead of [facility]. Wants to be rerouted?

## 2020-04-19 ENCOUNTER — Encounter: Payer: Self-pay | Admitting: Physician Assistant

## 2020-04-19 NOTE — Telephone Encounter (Signed)
Can we fax referral orders to [facility] as patient requested in message? Thank you. Let me know if I need to place a new order.

## 2020-05-01 DIAGNOSIS — L538 Other specified erythematous conditions: Secondary | ICD-10-CM | POA: Diagnosis not present

## 2020-05-01 DIAGNOSIS — C44529 Squamous cell carcinoma of skin of other part of trunk: Secondary | ICD-10-CM | POA: Diagnosis not present

## 2020-05-01 DIAGNOSIS — Q808 Other congenital ichthyosis: Secondary | ICD-10-CM | POA: Diagnosis not present

## 2020-05-01 DIAGNOSIS — L570 Actinic keratosis: Secondary | ICD-10-CM | POA: Diagnosis not present

## 2020-05-01 DIAGNOSIS — L820 Inflamed seborrheic keratosis: Secondary | ICD-10-CM | POA: Diagnosis not present

## 2020-05-01 DIAGNOSIS — Z85828 Personal history of other malignant neoplasm of skin: Secondary | ICD-10-CM | POA: Diagnosis not present

## 2020-05-01 DIAGNOSIS — Z08 Encounter for follow-up examination after completed treatment for malignant neoplasm: Secondary | ICD-10-CM | POA: Diagnosis not present

## 2020-05-01 DIAGNOSIS — L858 Other specified epidermal thickening: Secondary | ICD-10-CM | POA: Diagnosis not present

## 2020-05-01 DIAGNOSIS — X32XXXA Exposure to sunlight, initial encounter: Secondary | ICD-10-CM | POA: Diagnosis not present

## 2020-05-01 DIAGNOSIS — L298 Other pruritus: Secondary | ICD-10-CM | POA: Diagnosis not present

## 2020-05-09 DIAGNOSIS — H26491 Other secondary cataract, right eye: Secondary | ICD-10-CM | POA: Diagnosis not present

## 2020-05-10 ENCOUNTER — Ambulatory Visit (INDEPENDENT_AMBULATORY_CARE_PROVIDER_SITE_OTHER): Payer: PPO | Admitting: Physician Assistant

## 2020-05-10 ENCOUNTER — Other Ambulatory Visit: Payer: Self-pay

## 2020-05-10 VITALS — BP 143/54 | HR 78 | Temp 98.2°F | Resp 16 | Ht 69.0 in | Wt 135.0 lb

## 2020-05-10 DIAGNOSIS — R2689 Other abnormalities of gait and mobility: Secondary | ICD-10-CM | POA: Diagnosis not present

## 2020-05-10 DIAGNOSIS — I10 Essential (primary) hypertension: Secondary | ICD-10-CM | POA: Diagnosis not present

## 2020-05-10 NOTE — Patient Instructions (Signed)

## 2020-05-10 NOTE — Progress Notes (Signed)
Established patient visit   Patient: Wendy Patton   DOB: [birth year]   84 y.o. Female  MRN: [id number] Visit Date: 05/10/2020  Today's healthcare provider: Trinna Post, PA-C   Chief Complaint  Patient presents with  . Hypertension   Subjective    HPI  Hypertension, follow-up  BP Readings from Last 3 Encounters:  05/10/20 (!) 143/54  04/10/20 (!) 169/84  03/12/20 138/84   Wt Readings from Last 3 Encounters:  05/10/20 135 lb (61.2 kg)  04/10/20 134 lb 6.4 oz (61 kg)  03/12/20 134 lb 3.2 oz (60.9 kg)     She was last seen for hypertension 1 months ago.  BP at that visit was 169/84. Management since that visit includes starting losartan 25mg  daily.  She reports good compliance with treatment. She is not having side effects.  She is following a Regular diet. She is not exercising. She does not smoke.  Use of agents associated with hypertension: none.   Outside blood pressures are checked daily. She reports that it averages in 110s/70s. Symptoms: No chest pain No chest pressure  No palpitations No syncope  No dyspnea No orthopnea  No paroxysmal nocturnal dyspnea No lower extremity edema   Pertinent labs: Lab Results  Component Value Date   CHOL 255 (H) 12/13/2019   HDL 99 12/13/2019   LDLCALC 144 (H) 12/13/2019   TRIG 73 12/13/2019   CHOLHDL 2.6 12/13/2019   Lab Results  Component Value Date   NA 136 12/13/2019   K 4.6 12/13/2019   CREATININE 1.04 (H) 12/13/2019   GFRNONAA 51 (L) 12/13/2019   GFRAA 58 (L) 12/13/2019   GLUCOSE 98 12/13/2019     The ASCVD Risk score (Goff DC Jr., et al., 2013) failed to calculate for the following reasons:   The 2013 ASCVD risk score is only valid for ages 57 to 54      Medications: Outpatient Medications Prior to Visit  Medication Sig  . acetaminophen (TYLENOL) 650 MG CR tablet Take 650 mg by mouth 2 (two) times daily as needed for pain. (Patient not taking: Reported on 03/12/2020)  . amLODipine (NORVASC)  2.5 MG tablet Take 1 tablet (2.5 mg total) by mouth daily.  . Ammonium Lactate (AMLACTIN EX) Apply 1 application topically 4 (four) times daily.  . Calcium Carb-Cholecalciferol (CALCIUM 600+D) 600-800 MG-UNIT TABS Take 1 tablet by mouth 2 (two) times daily.  . cholecalciferol (VITAMIN D3) 25 MCG (1000 UNIT) tablet Take 1,000 Units by mouth daily.  . cycloSPORINE (RESTASIS) 0.05 % ophthalmic emulsion Place 1 drop into both eyes 2 (two) times daily.   . diphenhydrAMINE (BENADRYL) 25 mg capsule Take 25 mg by mouth at bedtime as needed.  Marland Kitchen estradiol (ESTRACE) 1 MG tablet Take 1 tablet (1 mg total) by mouth daily.  . Glucosamine HCl 1000 MG TABS Take 1,000 mg by mouth daily.  Marland Kitchen losartan (COZAAR) 25 MG tablet Take 1 tablet (25 mg total) by mouth daily.  . Omega-3 1000 MG CAPS Take 1,000 mg by mouth daily.   . pantoprazole (PROTONIX) 40 MG tablet Take 1 tablet (40 mg total) by mouth 2 (two) times daily.  Vladimir Faster Glycol-Propyl Glycol (SYSTANE OP) Place 1 drop into both eyes 2 (two) times daily as needed (dry eyes).   . sodium chloride (MURO 128) 5 % ophthalmic solution Place 1 drop into both eyes as needed (severe dry eyes).  . vitamin B-12 (CYANOCOBALAMIN) 1000 MCG tablet Take 1,000 mcg by mouth daily.  Marland Kitchen  vitamin E 1000 UNIT capsule Take 1,000 Units by mouth daily.     No facility-administered medications prior to visit.    Review of Systems  Constitutional: Negative.   Respiratory: Negative.   Cardiovascular: Negative.   Musculoskeletal: Negative.   Neurological: Negative.       Objective    BP (!) 143/54   Pulse 78   Temp 98.2 F (36.8 C)   Resp 16   Ht 5\' 9"  (1.753 m)   Wt 135 lb (61.2 kg)   BMI 19.94 kg/m    Physical Exam Constitutional:      Appearance: Normal appearance.  Cardiovascular:     Rate and Rhythm: Normal rate and regular rhythm.     Heart sounds: Normal heart sounds.  Pulmonary:     Effort: Pulmonary effort is normal.     Breath sounds: Normal breath  sounds.  Skin:    General: Skin is warm and dry.  Neurological:     Mental Status: She is alert and oriented to person, place, and time. Mental status is at baseline.  Psychiatric:        Mood and Affect: Mood normal.        Behavior: Behavior normal.       No results found for any visits on 05/10/20.  Assessment & Plan    1. Primary hypertension  Improved, continue losartan. Check CMET as below. Follow up Q6 months. Schedule for COVID booster.  - Comprehensive Metabolic Panel (CMET)  2. Imbalance  - Ambulatory referral to Physical Therapy   Return in about 6 months (around 11/08/2020) for CPE and follow up .      ITrinna Post, PA-C, have reviewed all documentation for this visit. The documentation on 05/10/20 for the exam, diagnosis, procedures, and orders are all accurate and complete.  The entirety of the information documented in the History of Present Illness, Review of Systems and Physical Exam were personally obtained by me. Portions of this information were initially documented by Wilburt Finlay, CMA and reviewed by me for thoroughness and accuracy.     Paulene Floor  [facility] [phone] (phone) [phone] (fax)  [place]

## 2020-05-11 LAB — COMPREHENSIVE METABOLIC PANEL
ALT: 8 IU/L (ref 0–32)
AST: 18 IU/L (ref 0–40)
Albumin/Globulin Ratio: 1.7 (ref 1.2–2.2)
Albumin: 4.4 g/dL (ref 3.6–4.6)
Alkaline Phosphatase: 61 IU/L (ref 44–121)
BUN/Creatinine Ratio: 18 (ref 12–28)
BUN: 16 mg/dL (ref 8–27)
Bilirubin Total: 0.3 mg/dL (ref 0.0–1.2)
CO2: 24 mmol/L (ref 20–29)
Calcium: 10.1 mg/dL (ref 8.7–10.3)
Chloride: 94 mmol/L — ABNORMAL LOW (ref 96–106)
Creatinine, Ser: 0.9 mg/dL (ref 0.57–1.00)
GFR calc Af Amer: 69 mL/min/{1.73_m2} (ref >59)
GFR calc non Af Amer: 60 mL/min/{1.73_m2} (ref >59)
Globulin, Total: 2.6 g/dL (ref 1.5–4.5)
Glucose: 121 mg/dL — ABNORMAL HIGH (ref 65–99)
Potassium: 4.4 mmol/L (ref 3.5–5.2)
Sodium: 133 mmol/L — ABNORMAL LOW (ref 134–144)
Total Protein: 7 g/dL (ref 6.0–8.5)

## 2020-05-17 ENCOUNTER — Other Ambulatory Visit: Payer: Self-pay | Admitting: Physician Assistant

## 2020-05-17 ENCOUNTER — Ambulatory Visit (INDEPENDENT_AMBULATORY_CARE_PROVIDER_SITE_OTHER): Payer: PPO

## 2020-05-17 ENCOUNTER — Other Ambulatory Visit: Payer: Self-pay

## 2020-05-17 DIAGNOSIS — Z23 Encounter for immunization: Secondary | ICD-10-CM | POA: Diagnosis not present

## 2020-05-17 DIAGNOSIS — I10 Essential (primary) hypertension: Secondary | ICD-10-CM

## 2020-05-20 ENCOUNTER — Ambulatory Visit: Payer: PPO | Attending: Physician Assistant | Admitting: Physical Therapy

## 2020-05-20 ENCOUNTER — Other Ambulatory Visit: Payer: Self-pay

## 2020-05-20 ENCOUNTER — Encounter: Payer: Self-pay | Admitting: Physical Therapy

## 2020-05-20 DIAGNOSIS — R2689 Other abnormalities of gait and mobility: Secondary | ICD-10-CM | POA: Insufficient documentation

## 2020-05-20 DIAGNOSIS — R296 Repeated falls: Secondary | ICD-10-CM | POA: Insufficient documentation

## 2020-05-20 NOTE — Therapy (Addendum)
[facility] [address] [place], [address] Phone: [phone]   Fax:  [phone]  Physical Therapy Evaluation  Patient Details  Name: Wendy Patton MRN: [id number] Date of Birth: May 24, 1936 No data recorded  Encounter Date: 05/20/2020   PT End of Session - 05/20/20 1609    Visit Number 1    Number of Visits 17    Date for PT Re-Evaluation 07/19/20    PT Start Time 0315    PT Stop Time 0405    PT Time Calculation (min) 50 min    Equipment Utilized During Treatment Gait belt    Activity Tolerance Patient tolerated treatment well    Behavior During Therapy [facility] for tasks assessed/performed           Past Medical History:  Diagnosis Date  . Anemia    after jaw surgery was on iron for short period of time  . Arthritis   . Cancer (HCC)    squamous cell  . Difficult intubation    potential for difficult airway due to limited mouth opening  . Dry skin   . GERD (gastroesophageal reflux disease)    takes Aciphex daily  . Headache(784.0)    hx of migraines   . Heart murmur   . History of migraine    last one Aug 2014 and takes Imitrex daily as needed  . History of staph infection   . IBS (irritable bowel syndrome)   . Joint pain   . Joint swelling   . Laryngopharyngeal reflux   . MVP (mitral valve prolapse)   . Neuralgia   . Neuropathy    takes gabapentin daily  . PONV (postoperative nausea and vomiting)    TMJ surgery several times  . Rheumatic fever    as a child  . Shortness of breath    with exertion  . Thyroid disease     Past Surgical History:  Procedure Laterality Date  . ABDOMINAL HYSTERECTOMY  2009  . BREAST DUCTAL SYSTEM EXCISION Right 01/11/2014   Procedure: MAJOR DUCT EXCISION RIGHT BREAST;  Surgeon: Stark Klein, MD;  Location: [facility];  Service: General;  Laterality: Right;  . BREAST EXCISIONAL BIOPSY Right 2015   neg  . BREAST EXCISIONAL BIOPSY Left 2010   benign  papilloma removed  . BREAST EXCISIONAL BIOPSY Left 1975   neg  . BREAST SURGERY  1975, 2010   benign breast tumor  . breat excision  1975   benign breast tumor excision  . CATARACT EXTRACTION W/PHACO Right 07/15/2016   Procedure: CATARACT EXTRACTION PHACO AND INTRAOCULAR LENS PLACEMENT (IOC);  Surgeon: Estill Cotta, MD;  Location: [facility];  Service: Ophthalmology;  Laterality: Right;  Lot # [id number] H [place]: 01:38.6 AP%: 25.0 CDE: 46.89  . CATARACT EXTRACTION W/PHACO Left 09/02/2016   Procedure: CATARACT EXTRACTION PHACO AND INTRAOCULAR LENS PLACEMENT (IOC);  Surgeon: Estill Cotta, MD;  Location: [facility];  Service: Ophthalmology;  Laterality: Left;  [place] 2:05.3 AP% 24.9 CDE 51.84 Fluid pack lot # [id number]  . cervial polyps  1999  . [place]  . COLONOSCOPY    . CYSTECTOMY  1962   vaginal cyst removal  . DILATION AND CURETTAGE OF UTERUS  1984  . ESOPHAGOGASTRODUODENOSCOPY    . granuloma  2002   chin granuloma removed  . [place], 2002, 2004   prosthesis, condyles implants, fossa replaced  . REVISION TOTAL KNEE ARTHROPLASTY    .  SIGMOIDOSCOPY  1993  . SQUAMOUS CELL CARCINOMA EXCISION  2008   left ankle/skin graft  . [place]  . THYROIDECTOMY  2006   left  . TONSILLECTOMY AND ADENOIDECTOMY  1944  . TOTAL HIP ARTHROPLASTY Left 09/12/2013   Procedure: LEFT TOTAL HIP ARTHROPLASTY ANTERIOR APPROACH;  Surgeon: [name], MD;  Location: [place];  Service: Orthopedics;  Laterality: Left;  . TOTAL HIP ARTHROPLASTY Right 04/18/2019   Procedure: RIGHT TOTAL HIP ARTHROPLASTY ANTERIOR APPROACH;  Surgeon: Melrose Nakayama, MD;  [facility];  Service: Orthopedics;  Laterality: Right;  . TOTAL HIP ARTHROPLASTY Right 08/15/2019   Procedure: TOTAL HIP ARTHROPLASTY ANTERIOR APPROACH;  Surgeon: Melrose Nakayama, MD;  [facility];  Service: Orthopedics;  Laterality: Right;  . TRAPEZIUM RESECTION  2000,  2004   removal right & left hand trapezium  . TUBAL LIGATION    . uterine polyps  2008  . vaginal cyst removed  1962    There were no vitals filed for this visit.    Subjective Assessment - 05/20/20 1519    Pertinent History Pt is a 84 year old female familiar with this clinic, d/c for L knee pain May 2021. Reports she has had 3 falls in past 6 months. All falls occur while walking, feels like her toes drag and get caught. She falls into the floor and reports she is unable to get up without something to hold onto. On July 4 she fell in the grass at a party, and had to have people help her up. When she has fallen at home she has been able to get herself to a chair or couch to get up modI ("very slowly"). PMH of bilat pedal neuropathy. She is completing exercises 4-5/week from previous HEP and yoga. She is retired, enjoys yoga, kniting. Reports she has had L hip pain over the past 6 months, when laying on L side 4/10, but does not bother her any other time. Pt denies N/V, B&B changes, unexplained weight fluctuation, saddle paresthesia, fever, night sweats, or unrelenting night pain at this time.    Limitations Lifting;Walking;House hold activities    How long can you sit comfortably? unlimited    How long can you stand comfortably? unlimited    How long can you walk comfortably? 88mins    Diagnostic tests None to date    Patient Stated Goals Stop tripping over "nothing" and increase balance    Currently in Pain? No/denies                OBJECTIVE  MUSCULOSKELETAL: Tremor: Absent Bulk: Normal Tone: Normal, no clonus  Posture No gross abnormalities noted in standing or seated posture  Gait Decreased heel strike and foot clearance bilat L>R decreased L step length   Strength R/L 4+/4+ Hip flexion 4-/4- Hip external rotation 5/5 Hip internal rotation 4+/4+ Hip extension  5/5 Hip abduction 5/5 Hip adduction 5/5 Knee extension 5/5 Knee flexion 5/5 Ankle Plantarflexion 4/3+  Ankle Dorsiflexion   ROM:  R/L -5/-1 PROM DF 5/-13 AROM DF  All other WNL  NEUROLOGICAL:  Mental Status Patient is oriented to person, place and time.  Recent memory is intact.  Remote memory is intact.  Attention span and concentration are intact.  Expressive speech is intact.  Patient's fund of knowledge is within normal limits for educational level.  Sensation Grossly intact to light touch bilateral UEs/LEs as determined by testing dermatomes C2-T2/L2-S2 respectively Proprioception and hot/cold testing deferred on this date  Coordination/Cerebellar Finger to Nose: WNL Heel to Shin: WNL Rapid alternating movements: WNL Finger Opposition: WNL Pronator Drift: Negative   FUNCTIONAL OUTCOME MEASURES   Results Comments  BERG 49/56 Fall risk, in need of intervention  DGI 19/24   FGA 19/30   5TSTS 15seconds   10 Meter Gait Speed Self-selected: s = 0.9m/s; Fastest: s = 1.84m/s Below normative values for full community ambulation   SLS R 13sec L 1sec  POSTURAL CONTROL TESTS   Modified Clinical Test of Sensory Interaction for Balance    (CTSIB):  CONDITION TIME STRATEGY SWAY  Eyes open, firm surface 30 seconds ankle   Eyes closed, firm surface 30 seconds ankle   Eyes open, foam surface 30 seconds ankle   Eyes closed, foam surface 30 seconds Ankle moderate     Ther-Ex PT reviewed the following HEP with patient with patient able to demonstrate a set of the following with min cuing for correction needed. PT educated patient on parameters of therex (how/when to inc/decrease intensity, frequency, rep/set range, stretch hold time, and purpose of therex) with verbalized understanding.  Access Code: [email or web address] Gastroc Stretch on Wall - 3 x daily - 7 x weekly - 60sec hold Seated Toe Raise - 3 x daily - 7 x weekly - 3 sets - 12-20 reps Standing Single Leg Stance with Counter Support - 1 x daily - 7 x weekly - 3 sets - 30sec hold            [facility] PT Assessment -  05/20/20 1546      Standardized Balance Assessment   Standardized Balance Assessment Dynamic Gait Index      Dynamic Gait Index   Level Surface Normal    Change in Gait Speed Normal    Gait with Horizontal Head Turns Normal    Gait with Vertical Head Turns Mild Impairment    Gait and Pivot Turn Moderate Impairment    Step Over Obstacle Mild Impairment    Step Around Obstacles Mild Impairment    Steps Normal    Total Score 19                      Objective measurements completed on examination: See above findings.               PT Education - 05/20/20 1530    Education Details Patient was educated on diagnosis, anatomy and pathology involved, prognosis, role of PT, and was given an HEP, demonstrating exercise with proper form following verbal and tactile cues, and was given a paper hand out to continue exercise at home. Pt was educated on and agreed to plan of care.    Person(s) Educated Patient    Methods Explanation;Demonstration;Verbal cues;Tactile cues;Handout    Comprehension Verbalized understanding;Returned demonstration;Verbal cues required;Tactile cues required            PT Short Term Goals - 05/21/20 1023      PT SHORT TERM GOAL #1   Title Pt will be independent with HEP in order to improve strength and decrease back pain in order to improve pain-free function at home and work.    Baseline 10/19//21 HEP given    Time 4    Period Weeks    Status New             PT Long Term Goals - 05/21/20 1024      PT LONG TERM GOAL #1   Title Patient will increase FOTO score to 58 to demonstrate  predicted increase in functional mobility to complete ADLs    Baseline 05/21/20 70    Time 8    Period Weeks    Status New      PT LONG TERM GOAL #2   Title Pt will decrease 5TSTS by at least 3 seconds in order to demonstrate clinically significant improvement in LE strength.    Baseline 05/20/20 15sec    Time 8    Period Weeks    Status New       PT LONG TERM GOAL #3   Title Pt will improve FGA by at least 3 points in order to demonstrate clinically significant improvement in balance and decreased risk for falls.    Baseline 05/20/20 19/30    Time 8    Period Weeks    Status New                  Plan - 05/21/20 1009    Clinical Impression Statement Pt is an 84 year old female presenting with balance deficits and frequent falls. Impairments in decreased ankle mobility (DF) and strength, decreased motor control, decreased dynamic and static SLS balance. Activity limitations in ambulation, changes in surface, fuctional squatting, lifting, reaching; inhibiting safe ADL management and community ambulation. Pt will benefit from skilled PT to address impairments to best return to PLOF.    Personal Factors and Comorbidities Comorbidity 1;Comorbidity 2;Comorbidity 3+;Age;Fitness;Past/Current Experience;Sex    Comorbidities bilat neuropathy, arthritis, anemia, history of cancer    Examination-Activity Limitations Lift;Squat;Locomotion Level;Stairs;Reach Overhead;Stand    Examination-Participation Restrictions Meal Prep;Community Activity;Cleaning;Laundry;Yard Work    [employer] officer Evolving/Moderate complexity    Clinical Decision Making Moderate    Rehab Potential Good    PT Frequency 2x / week    PT Duration 8 weeks    PT Treatment/Interventions ADLs/Self Care Home Management;Cryotherapy;Ultrasound;Stair training;Balance training;Dry needling;Moist Heat;Traction;Gait training;Therapeutic exercise;Patient/family education;Manual techniques;Passive range of motion;Electrical Stimulation;Functional mobility training;Therapeutic activities;Neuromuscular re-education;Energy conservation;Joint Manipulations;Spinal Manipulations    PT Next Visit Plan HEP assessment, high level gait training    PT Home Exercise Plan SLS, DF, gastroc stretch    Consulted and Agree with Plan of Care Patient           Patient will  benefit from skilled therapeutic intervention in order to improve the following deficits and impairments:  Abnormal gait, Decreased balance, Decreased endurance, Decreased mobility, Difficulty walking, Decreased range of motion, Improper body mechanics, Pain, Postural dysfunction, Impaired flexibility, Increased fascial restricitons, Decreased strength, Decreased coordination, Decreased activity tolerance  Visit Diagnosis: Other abnormalities of gait and mobility - Plan: PT plan of care cert/re-cert  Repeated falls - Plan: PT plan of care cert/re-cert     Problem List Patient Active Problem List   Diagnosis Date Noted  . History of revision of total replacement of right hip joint 08/15/2019  . Primary localized osteoarthritis of right hip 04/18/2019  . Primary osteoarthritis of right hip 04/18/2019  . Pectus excavatum 10/12/2017  . 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 Perimenopausal atrophic vaginitis 09/22/2016  . Menopausal hot flushes 09/22/2016  . Mitral valve prolapse 01/13/2016  . Herniation of nucleus pulposus 05/01/2015  . Hypercholesteremia 05/01/2015  . Osteoarthritis of both knees 05/01/2015  . History of migraine headaches 05/01/2015  . Esophageal reflux 01/10/2015  . S/P total hip arthroplasty 09/12/2013   Durwin Reges DPT Durwin Reges 05/21/2020, 1:57 PM  [place] [facility] [address], [place], [address] Phone: [phone]   Fax:  [phone]  Name: Wendy Patton MRN: [id number] Date of Birth: [birth year]

## 2020-05-21 NOTE — Addendum Note (Signed)
Addended by: Kelton Pillar on: 05/21/2020 10:32 AM   Modules accepted: Orders

## 2020-05-22 ENCOUNTER — Encounter: Payer: PPO | Admitting: Physical Therapy

## 2020-05-27 ENCOUNTER — Other Ambulatory Visit: Payer: Self-pay

## 2020-05-27 ENCOUNTER — Ambulatory Visit: Payer: PPO | Admitting: Physical Therapy

## 2020-05-27 ENCOUNTER — Other Ambulatory Visit: Payer: Self-pay | Admitting: Physician Assistant

## 2020-05-27 ENCOUNTER — Encounter: Payer: Self-pay | Admitting: Physical Therapy

## 2020-05-27 DIAGNOSIS — R296 Repeated falls: Secondary | ICD-10-CM

## 2020-05-27 DIAGNOSIS — I10 Essential (primary) hypertension: Secondary | ICD-10-CM

## 2020-05-27 DIAGNOSIS — R2689 Other abnormalities of gait and mobility: Secondary | ICD-10-CM | POA: Diagnosis not present

## 2020-05-27 MED ORDER — LOSARTAN POTASSIUM 25 MG PO TABS: 25.0000 mg | ORAL_TABLET | Freq: Every day | ORAL | 0 refills | Status: DC

## 2020-05-27 NOTE — Therapy (Signed)
[facility] [address], [place], [address] Phone: [phone]   Fax:  [phone]  Physical Therapy Treatment  Patient Details  Name: CARLISIA GENO MRN: [id number] Date of Birth: 09-16-1935 No data recorded  Encounter Date: 05/27/2020   PT End of Session - 05/27/20 1631    Visit Number 2    Number of Visits 17    Date for PT Re-Evaluation 07/19/20    PT Start Time 0145    PT Stop Time 0230    PT Time Calculation (min) 45 min    Equipment Utilized During Treatment Gait belt    Activity Tolerance Patient tolerated treatment well    Behavior During Therapy [facility] for tasks assessed/performed           Past Medical History:  Diagnosis Date  . Anemia    after jaw surgery was on iron for short period of time  . Arthritis   . Cancer (HCC)    squamous cell  . Difficult intubation    potential for difficult airway due to limited mouth opening  . Dry skin   . GERD (gastroesophageal reflux disease)    takes Aciphex daily  . Headache(784.0)    hx of migraines   . Heart murmur   . History of migraine    last one Aug 2014 and takes Imitrex daily as needed  . History of staph infection   . IBS (irritable bowel syndrome)   . Joint pain   . Joint swelling   . Laryngopharyngeal reflux   . MVP (mitral valve prolapse)   . Neuralgia   . Neuropathy    takes gabapentin daily  . PONV (postoperative nausea and vomiting)    TMJ surgery several times  . Rheumatic fever    as a child  . Shortness of breath    with exertion  . Thyroid disease     Past Surgical History:  Procedure Laterality Date  . ABDOMINAL HYSTERECTOMY  2009  . BREAST DUCTAL SYSTEM EXCISION Right 01/11/2014   Procedure: MAJOR DUCT EXCISION RIGHT BREAST;  Surgeon: Stark Klein, MD;  [facility]: [facility];  Service: General;  Laterality: Right;  . BREAST EXCISIONAL BIOPSY Right 2015   neg  . BREAST EXCISIONAL BIOPSY Left 2010   benign  papilloma removed  . BREAST EXCISIONAL BIOPSY Left 1975   neg  . BREAST SURGERY  1975, 2010   benign breast tumor  . breat excision  1975   benign breast tumor excision  . CATARACT EXTRACTION W/PHACO Right 07/15/2016   Procedure: CATARACT EXTRACTION PHACO AND INTRAOCULAR LENS PLACEMENT (IOC);  Surgeon: Estill Cotta, MD;  Location: [facility];  Service: Ophthalmology;  Laterality: Right;  Lot # [id number] H [place]: 01:38.6 AP%: 25.0 CDE: 46.89  . CATARACT EXTRACTION W/PHACO Left 09/02/2016   Procedure: CATARACT EXTRACTION PHACO AND INTRAOCULAR LENS PLACEMENT (IOC);  Surgeon: Estill Cotta, MD;  Location: [facility];  Service: Ophthalmology;  Laterality: Left;  [place] 2:05.3 AP% 24.9 CDE 51.84 Fluid pack lot # [id number]  . cervial polyps  1999  . [place]  . COLONOSCOPY    . CYSTECTOMY  1962   vaginal cyst removal  . DILATION AND CURETTAGE OF UTERUS  1984  . ESOPHAGOGASTRODUODENOSCOPY    . granuloma  2002   chin granuloma removed  . [place], 2002, 2004   prosthesis, condyles implants, fossa replaced  . REVISION TOTAL KNEE ARTHROPLASTY    .  SIGMOIDOSCOPY  1993  . SQUAMOUS CELL CARCINOMA EXCISION  2008   left ankle/skin graft  . [place]  . THYROIDECTOMY  2006   left  . TONSILLECTOMY AND ADENOIDECTOMY  1944  . TOTAL HIP ARTHROPLASTY Left 09/12/2013   Procedure: LEFT TOTAL HIP ARTHROPLASTY ANTERIOR APPROACH;  Surgeon: [name], MD;  Location: [place];  Service: Orthopedics;  Laterality: Left;  . TOTAL HIP ARTHROPLASTY Right 04/18/2019   Procedure: RIGHT TOTAL HIP ARTHROPLASTY ANTERIOR APPROACH;  Surgeon: Melrose Nakayama, MD;  [facility];  Service: Orthopedics;  Laterality: Right;  . TOTAL HIP ARTHROPLASTY Right 08/15/2019   Procedure: TOTAL HIP ARTHROPLASTY ANTERIOR APPROACH;  Surgeon: Melrose Nakayama, MD;  [facility];  Service: Orthopedics;  Laterality: Right;  . TRAPEZIUM RESECTION  2000,  2004   removal right & left hand trapezium  . TUBAL LIGATION    . uterine polyps  2008  . vaginal cyst removed  1962    There were no vitals filed for this visit.   Subjective Assessment - 05/27/20 1531    Subjective Pt reports no pain or falls since last visit. Has been completing HEP.    Pertinent History Pt is a 84 year old female familiar with this clinic, d/c for L knee pain May 2021. Reports she has had 3 falls in past 6 months. All falls occur while walking, feels like her toes drag and get caught. She falls into the floor and reports she is unable to get up without something to hold onto. On July 4 she fell in the grass at a party, and had to have people help her up. When she has fallen at home she has been able to get herself to a chair or couch to get up modI ("very slowly"). PMH of bilat pedal neuropathy. She is completing exercises 4-5/week from previous HEP and yoga. She is retired, enjoys yoga, kniting. Reports she has had L hip pain over the past 6 months, when laying on L side 4/10, but does not bother her any other time. Pt denies N/V, B&B changes, unexplained weight fluctuation, saddle paresthesia, fever, night sweats, or unrelenting night pain at this time.    Limitations Lifting;Walking;House hold activities    How long can you sit comfortably? unlimited    How long can you stand comfortably? unlimited    How long can you walk comfortably? 91mins    Diagnostic tests None to date    Patient Stated Goals Stop tripping over "nothing" and increase balance             Ther-Ex Walking up 5% grade 1.49mph for increased foot clearance and DF 18mins Seated DF x12; minimal lift with LLE Completed again following G3 post talocrual mobilization with movement 4x 30sec and able to complete x15 with increased mobility Supine bilat DF with PT overpressure to LLE with hold then release x20 Duck walks 4x 69ft with CGA for safety, minimal ability to maintain with LLE Alt heel taps onto  6in cone on 6in step (12in lift) 2x 12with cuing to prevent "whip compensation" with good carry over SLS LLE 3x 30sec with 2 finger support, able to complete with RLE  Tandem walking FWD 68ft BWD 83ft x2 rounds CGA with min cuing for speed with good carry over Standing on foam narrow BOS with ball toss/catch PT throwing slightly outside BOS with cuing for trunk rotation with decent carry over Standing gastroc stretch 52min bilat  PT Education - 05/27/20 1631    Education Details HEP review, therex technique/form    Person(s) Educated Patient    Methods Explanation;Demonstration;Verbal cues    Comprehension Verbalized understanding;Returned demonstration;Verbal cues required            PT Short Term Goals - 05/21/20 1023      PT SHORT TERM GOAL #1   Title Pt will be independent with HEP in order to improve strength and decrease back pain in order to improve pain-free function at home and work.    Baseline 10/19//21 HEP given    Time 4    Period Weeks    Status New             PT Long Term Goals - 05/21/20 1024      PT LONG TERM GOAL #1   Title Patient will increase FOTO score to 58 to demonstrate predicted increase in functional mobility to complete ADLs    Baseline 05/21/20 70    Time 8    Period Weeks    Status New      PT LONG TERM GOAL #2   Title Pt will decrease 5TSTS by at least 3 seconds in order to demonstrate clinically significant improvement in LE strength.    Baseline 05/20/20 15sec    Time 8    Period Weeks    Status New      PT LONG TERM GOAL #3   Title Pt will improve FGA by at least 3 points in order to demonstrate clinically significant improvement in balance and decreased risk for falls.    Baseline 05/20/20 19/30    Time 8    Period Weeks    Status New                 Plan - 05/27/20 1632    Clinical Impression Statement PT led patient through therex for increased ankle mobility, foot clearance  for decreased falls, and balance strategies with success. Patient is motivated throughout session and is able to comply with all cuing for proper technique of therex. Patient demonstrates decreased AROM>PROM L ankle DF and GT ext. Patient is able to increase ankle DF with manual techniques, and is able to obtain 75% of availible passive motion actively. PT will continue progression as able.    Personal Factors and Comorbidities Comorbidity 1;Comorbidity 2;Comorbidity 3+;Age;Fitness;Past/Current Experience;Sex    Comorbidities bilat neuropathy, arthritis, anemia, history of cancer    Examination-Activity Limitations Lift;Squat;Locomotion Level;Stairs;Reach Overhead;Stand    Examination-Participation Restrictions Meal Prep;Community Activity;Cleaning;Laundry;Yard Work    [employer] officer Evolving/Moderate complexity    Clinical Decision Making Moderate    Rehab Potential Good    PT Frequency 2x / week    PT Duration 8 weeks    PT Treatment/Interventions ADLs/Self Care Home Management;Cryotherapy;Ultrasound;Stair training;Balance training;Dry needling;Moist Heat;Traction;Gait training;Therapeutic exercise;Patient/family education;Manual techniques;Passive range of motion;Electrical Stimulation;Functional mobility training;Therapeutic activities;Neuromuscular re-education;Energy conservation;Joint Manipulations;Spinal Manipulations    PT Next Visit Plan HEP assessment, high level gait training    PT Home Exercise Plan SLS, DF, gastroc stretch    Consulted and Agree with Plan of Care Patient           Patient will benefit from skilled therapeutic intervention in order to improve the following deficits and impairments:  Abnormal gait, Decreased balance, Decreased endurance, Decreased mobility, Difficulty walking, Decreased range of motion, Improper body mechanics, Pain, Postural dysfunction, Impaired flexibility, Increased fascial restricitons, Decreased strength, Decreased  coordination, Decreased activity tolerance  Visit Diagnosis: Other abnormalities of gait  and mobility  Repeated falls     Problem List Patient Active Problem List   Diagnosis Date Noted  . History of revision of total replacement of right hip joint 08/15/2019  . Primary localized osteoarthritis of right hip 04/18/2019  . Primary osteoarthritis of right hip 04/18/2019  . Pectus excavatum 10/12/2017  . 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 Perimenopausal atrophic vaginitis 09/22/2016  . Menopausal hot flushes 09/22/2016  . Mitral valve prolapse 01/13/2016  . Herniation of nucleus pulposus 05/01/2015  . Hypercholesteremia 05/01/2015  . Osteoarthritis of both knees 05/01/2015  . History of migraine headaches 05/01/2015  . Esophageal reflux 01/10/2015  . S/P total hip arthroplasty 09/12/2013   Durwin Reges DPT Durwin Reges 05/27/2020, 4:46 PM  [place] [address] [place], [address] Phone: [phone]   Fax:  [phone]  Name: TARAE WOODEN MRN: [id number] Date of Birth: Oct 12, 1935

## 2020-05-27 NOTE — Telephone Encounter (Signed)
The Rx for losartan (COZAAR) 25 MG tablet  That was sent to pharmacy on 10.15.21 never made it to the pharmacy/ please resend today/ Pt asked for it to be a 90 day supply / please advise

## 2020-05-29 ENCOUNTER — Other Ambulatory Visit: Payer: Self-pay

## 2020-05-29 ENCOUNTER — Ambulatory Visit: Payer: PPO | Admitting: Physical Therapy

## 2020-05-29 ENCOUNTER — Encounter: Payer: Self-pay | Admitting: Physical Therapy

## 2020-05-29 DIAGNOSIS — R2689 Other abnormalities of gait and mobility: Secondary | ICD-10-CM

## 2020-05-29 DIAGNOSIS — R296 Repeated falls: Secondary | ICD-10-CM

## 2020-05-29 NOTE — Therapy (Signed)
[facility] [address], [place], [address] Phone: [phone]   Fax:  [phone]  Physical Therapy Treatment  Patient Details  Name: Wendy Patton MRN: [id number] Date of Birth: [birth year] No data recorded  Encounter Date: 05/29/2020   PT End of Session - 05/29/20 1309    Visit Number 3    Number of Visits 17    Date for PT Re-Evaluation 07/19/20    Authorization - Visit Number 3    Authorization - Number of Visits 10    PT Start Time 0103    PT Stop Time 0145    PT Time Calculation (min) 42 min    Equipment Utilized During Treatment Gait belt    Activity Tolerance Patient tolerated treatment well    Behavior During Therapy [facility] for tasks assessed/performed           Past Medical History:  Diagnosis Date  . Anemia    after jaw surgery was on iron for short period of time  . Arthritis   . Cancer (HCC)    squamous cell  . Difficult intubation    potential for difficult airway due to limited mouth opening  . Dry skin   . GERD (gastroesophageal reflux disease)    takes Aciphex daily  . Headache(784.0)    hx of migraines   . Heart murmur   . History of migraine    last one Aug 2014 and takes Imitrex daily as needed  . History of staph infection   . IBS (irritable bowel syndrome)   . Joint pain   . Joint swelling   . Laryngopharyngeal reflux   . MVP (mitral valve prolapse)   . Neuralgia   . Neuropathy    takes gabapentin daily  . PONV (postoperative nausea and vomiting)    TMJ surgery several times  . Rheumatic fever    as a child  . Shortness of breath    with exertion  . Thyroid disease     Past Surgical History:  Procedure Laterality Date  . ABDOMINAL HYSTERECTOMY  2009  . BREAST DUCTAL SYSTEM EXCISION Right 01/11/2014   Procedure: MAJOR DUCT EXCISION RIGHT BREAST;  Surgeon: Stark Klein, MD;  Location: [facility];  Service: General;  Laterality: Right;  . BREAST EXCISIONAL  BIOPSY Right 2015   neg  . BREAST EXCISIONAL BIOPSY Left 2010   benign papilloma removed  . BREAST EXCISIONAL BIOPSY Left 1975   neg  . BREAST SURGERY  1975, 2010   benign breast tumor  . breat excision  1975   benign breast tumor excision  . CATARACT EXTRACTION W/PHACO Right 07/15/2016   Procedure: CATARACT EXTRACTION PHACO AND INTRAOCULAR LENS PLACEMENT (IOC);  Surgeon: Estill Cotta, MD;  Location: [facility];  Service: Ophthalmology;  Laterality: Right;  Lot # [id number] H [place]: 01:38.6 AP%: 25.0 CDE: 46.89  . CATARACT EXTRACTION W/PHACO Left 09/02/2016   Procedure: CATARACT EXTRACTION PHACO AND INTRAOCULAR LENS PLACEMENT (IOC);  Surgeon: Estill Cotta, MD;  Location: [facility];  Service: Ophthalmology;  Laterality: Left;  [place] 2:05.3 AP% 24.9 CDE 51.84 Fluid pack lot # [id number]  . cervial polyps  1999  . [place]  . COLONOSCOPY    . CYSTECTOMY  1962   vaginal cyst removal  . DILATION AND CURETTAGE OF UTERUS  1984  . ESOPHAGOGASTRODUODENOSCOPY    . granuloma  2002   chin granuloma removed  . [place],  2002, 2004   prosthesis, condyles implants, fossa replaced  . REVISION TOTAL KNEE ARTHROPLASTY    . SIGMOIDOSCOPY  1993  . SQUAMOUS CELL CARCINOMA EXCISION  2008   left ankle/skin graft  . [place]  . THYROIDECTOMY  2006   left  . TONSILLECTOMY AND ADENOIDECTOMY  1944  . TOTAL HIP ARTHROPLASTY Left 09/12/2013   Procedure: LEFT TOTAL HIP ARTHROPLASTY ANTERIOR APPROACH;  Surgeon: [name], MD;  Location: [place];  Service: Orthopedics;  Laterality: Left;  . TOTAL HIP ARTHROPLASTY Right 04/18/2019   Procedure: RIGHT TOTAL HIP ARTHROPLASTY ANTERIOR APPROACH;  Surgeon: Melrose Nakayama, MD;  [facility];  Service: Orthopedics;  Laterality: Right;  . TOTAL HIP ARTHROPLASTY Right 08/15/2019   Procedure: TOTAL HIP ARTHROPLASTY ANTERIOR APPROACH;  Surgeon: Melrose Nakayama, MD;  [facility];   Service: Orthopedics;  Laterality: Right;  . TRAPEZIUM RESECTION  2000, 2004   removal right & left hand trapezium  . TUBAL LIGATION    . uterine polyps  2008  . vaginal cyst removed  1962    There were no vitals filed for this visit.   Subjective Assessment - 05/29/20 1301    Subjective Pt reports no pain or falls since last visit. Has been working on normalized gait. Has been completing HEP.    Pertinent History Pt is a 84 year old female familiar with this clinic, d/c for L knee pain May 2021. Reports she has had 3 falls in past 6 months. All falls occur while walking, feels like her toes drag and get caught. She falls into the floor and reports she is unable to get up without something to hold onto. On July 4 she fell in the grass at a party, and had to have people help her up. When she has fallen at home she has been able to get herself to a chair or couch to get up modI ("very slowly"). PMH of bilat pedal neuropathy. She is completing exercises 4-5/week from previous HEP and yoga. She is retired, enjoys yoga, kniting. Reports she has had L hip pain over the past 6 months, when laying on L side 4/10, but does not bother her any other time. Pt denies N/V, B&B changes, unexplained weight fluctuation, saddle paresthesia, fever, night sweats, or unrelenting night pain at this time.    Limitations Lifting;Walking;House hold activities    How long can you sit comfortably? unlimited    How long can you stand comfortably? unlimited    How long can you walk comfortably? 54mins    Diagnostic tests None to date    Patient Stated Goals Stop tripping over "nothing" and increase balance            Ther-Ex Walking up 5% grade 1.38mph for increased foot clearance and DF 11mins Following G3 post talocrual mobilization with movement 4x 30sec and able to complete supine DF x15 with increased mobility following DF with RTB resistance 3x 10 with cuing for full ROM  Duck walks 4x 5ft with CGA for safety,  minimal ability to maintain with LLE Alt heel taps onto 6in cone on 6in step (12in lift) standing on foam 3x 12 with cuing to prevent "whip compensation" with good carry over Stepping on uneven surface (5 balance stone under mat) straight across 12ft; in zig zag pattern with 180d turn CGA with HHA for safety  SLS LLE 3x 30sec with 2 finger support, able to complete with RLE  Standing on foam narrow BOS with  ball toss/catch x12; in semi tandem/stagger x12  With minA for safety PT throwing slightly outside BOS with cuing for trunk rotation with decent carry over Standing gastroc stretch 70min bilat                             PT Education - 05/29/20 1309    Education Details therex form/technique    Person(s) Educated Patient    Methods Explanation;Demonstration;Verbal cues    Comprehension Verbalized understanding;Returned demonstration;Verbal cues required            PT Short Term Goals - 05/21/20 1023      PT SHORT TERM GOAL #1   Title Pt will be independent with HEP in order to improve strength and decrease back pain in order to improve pain-free function at home and work.    Baseline 10/19//21 HEP given    Time 4    Period Weeks    Status New             PT Long Term Goals - 05/21/20 1024      PT LONG TERM GOAL #1   Title Patient will increase FOTO score to 58 to demonstrate predicted increase in functional mobility to complete ADLs    Baseline 05/21/20 70    Time 8    Period Weeks    Status New      PT LONG TERM GOAL #2   Title Pt will decrease 5TSTS by at least 3 seconds in order to demonstrate clinically significant improvement in LE strength.    Baseline 05/20/20 15sec    Time 8    Period Weeks    Status New      PT LONG TERM GOAL #3   Title Pt will improve FGA by at least 3 points in order to demonstrate clinically significant improvement in balance and decreased risk for falls.    Baseline 05/20/20 19/30    Time 8    Period Weeks     Status New                 Plan - 05/29/20 1329    Clinical Impression Statement PT continued therex progression for increased ankle mobility for safe/normalized gait mechanics and dynamic balance strategies. Pt requires gaurding for safety, and is able to comply with all cuing for proper technique of therex with good motivation and no increase pain. PT will continue progression as able.    Personal Factors and Comorbidities Comorbidity 1;Comorbidity 2;Comorbidity 3+;Age;Fitness;Past/Current Experience;Sex    Comorbidities bilat neuropathy, arthritis, anemia, history of cancer    Examination-Activity Limitations Lift;Squat;Locomotion Level;Stairs;Reach Overhead;Stand    Examination-Participation Restrictions Meal Prep;Community Activity;Cleaning;Laundry;Yard Work    [employer] Evolving/Moderate complexity    Clinical Decision Making Moderate    Rehab Potential Good    PT Frequency 2x / week    PT Duration 8 weeks    PT Treatment/Interventions ADLs/Self Care Home Management;Cryotherapy;Ultrasound;Stair training;Balance training;Dry needling;Moist Heat;Traction;Gait training;Therapeutic exercise;Patient/family education;Manual techniques;Passive range of motion;Electrical Stimulation;Functional mobility training;Therapeutic activities;Neuromuscular re-education;Energy conservation;Joint Manipulations;Spinal Manipulations    PT Next Visit Plan HEP assessment, high level gait training    PT Home Exercise Plan SLS, DF, gastroc stretch    Consulted and Agree with Plan of Care Patient           Patient will benefit from skilled therapeutic intervention in order to improve the following deficits and impairments:  Abnormal gait, Decreased balance, Decreased endurance, Decreased mobility, Difficulty walking,  Decreased range of motion, Improper body mechanics, Pain, Postural dysfunction, Impaired flexibility, Increased fascial restricitons, Decreased strength, Decreased  coordination, Decreased activity tolerance  Visit Diagnosis: Other abnormalities of gait and mobility  Repeated falls     Problem List Patient Active Problem List   Diagnosis Date Noted  . History of revision of total replacement of right hip joint 08/15/2019  . Primary localized osteoarthritis of right hip 04/18/2019  . Primary osteoarthritis of right hip 04/18/2019  . Pectus excavatum 10/12/2017  . 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 Perimenopausal atrophic vaginitis 09/22/2016  . Menopausal hot flushes 09/22/2016  . Mitral valve prolapse 01/13/2016  . Herniation of nucleus pulposus 05/01/2015  . Hypercholesteremia 05/01/2015  . Osteoarthritis of both knees 05/01/2015  . History of migraine headaches 05/01/2015  . Esophageal reflux 01/10/2015  . S/P total hip arthroplasty 09/12/2013   Durwin Reges DPT Durwin Reges 05/29/2020, 1:46 PM  [place] [facility] [address] [place], [address] Phone: [phone]   Fax:  [phone]  Name: Wendy Patton MRN: [id number] Date of Birth: 1935-12-20

## 2020-06-03 ENCOUNTER — Ambulatory Visit: Payer: PPO | Admitting: Physical Therapy

## 2020-06-05 ENCOUNTER — Ambulatory Visit: Payer: PPO | Attending: Physician Assistant | Admitting: Physical Therapy

## 2020-06-05 ENCOUNTER — Encounter: Payer: Self-pay | Admitting: Physical Therapy

## 2020-06-05 ENCOUNTER — Other Ambulatory Visit: Payer: Self-pay

## 2020-06-05 DIAGNOSIS — M25561 Pain in right knee: Secondary | ICD-10-CM | POA: Diagnosis not present

## 2020-06-05 DIAGNOSIS — R296 Repeated falls: Secondary | ICD-10-CM | POA: Diagnosis not present

## 2020-06-05 DIAGNOSIS — R2689 Other abnormalities of gait and mobility: Secondary | ICD-10-CM | POA: Insufficient documentation

## 2020-06-05 DIAGNOSIS — G8929 Other chronic pain: Secondary | ICD-10-CM | POA: Insufficient documentation

## 2020-06-05 DIAGNOSIS — M6281 Muscle weakness (generalized): Secondary | ICD-10-CM | POA: Diagnosis not present

## 2020-06-05 DIAGNOSIS — M25562 Pain in left knee: Secondary | ICD-10-CM | POA: Insufficient documentation

## 2020-06-05 DIAGNOSIS — R262 Difficulty in walking, not elsewhere classified: Secondary | ICD-10-CM | POA: Insufficient documentation

## 2020-06-05 NOTE — Therapy (Signed)
[place] [facility] [address] [place], [place], [address] Phone: [phone]   Fax:  [phone]  Physical Therapy Treatment  Patient Details  Name: Wendy Patton MRN: [id number] Date of Birth: [birth year] No data recorded  Encounter Date: 06/05/2020   PT End of Session - 06/05/20 1027    Visit Number 4    Number of Visits 17    Date for PT Re-Evaluation 07/19/20    Authorization - Visit Number 4    Authorization - Number of Visits 10    PT Start Time [phone]    PT Stop Time 0914    PT Time Calculation (min) 41 min    Equipment Utilized During Treatment Gait belt    Activity Tolerance Patient tolerated treatment well    Behavior During Therapy [employer] for tasks assessed/performed           Past Medical History:  Diagnosis Date   Anemia    after jaw surgery was on iron for short period of time   Arthritis    Cancer ([place])    squamous cell   Difficult intubation    potential for difficult airway due to limited mouth opening   Dry skin    GERD (gastroesophageal reflux disease)    takes Aciphex daily   Headache(784.0)    hx of migraines    Heart murmur    History of migraine    last one Aug 2014 and takes Imitrex daily as needed   History of staph infection    IBS (irritable bowel syndrome)    Joint pain    Joint swelling    Laryngopharyngeal reflux    MVP (mitral valve prolapse)    Neuralgia    Neuropathy    takes gabapentin daily   PONV (postoperative nausea and vomiting)    TMJ surgery several times   Rheumatic fever    as a child   Shortness of breath    with exertion   Thyroid disease     Past Surgical History:  Procedure Laterality Date   ABDOMINAL HYSTERECTOMY  2009   BREAST DUCTAL SYSTEM EXCISION Right 01/11/2014   Procedure: MAJOR DUCT EXCISION RIGHT BREAST;  Surgeon: Stark Klein, MD;  [facility];  Service: General;  Laterality: Right;   BREAST EXCISIONAL  BIOPSY Right 2015   neg   BREAST EXCISIONAL BIOPSY Left 2010   benign papilloma removed   BREAST EXCISIONAL BIOPSY Left 1975   neg   [place], 2010   benign breast tumor   breat excision  1975   benign breast tumor excision   CATARACT EXTRACTION W/PHACO Right 07/15/2016   Procedure: CATARACT EXTRACTION PHACO AND INTRAOCULAR LENS PLACEMENT ([place]);  Surgeon: Estill Cotta, MD;  Location: [facility];  Service: Ophthalmology;  Laterality: Right;  Lot # [id number] H [place]: 01:38.6 AP%: 25.0 CDE: 46.89   CATARACT EXTRACTION W/PHACO Left 09/02/2016   Procedure: CATARACT EXTRACTION PHACO AND INTRAOCULAR LENS PLACEMENT (IOC);  Surgeon: Estill Cotta, MD;  Location: [facility];  Service: Ophthalmology;  Laterality: Left;  [place] 2:05.3 AP% 24.9 CDE 51.84 Fluid pack lot # [id number]   cervial polyps  1999   CESAREAN SECTION  1967   COLONOSCOPY     CYSTECTOMY  1962   vaginal cyst removal   DILATION AND CURETTAGE OF UTERUS  1984   ESOPHAGOGASTRODUODENOSCOPY     granuloma  2002   chin granuloma removed   [place], 1984, [place],  2002, 2004   prosthesis, condyles implants, fossa replaced   [place] CELL CARCINOMA EXCISION  2008   left ankle/skin graft   TEMPOROMANDIBULAR JOINT SURGERY  1984   THYROIDECTOMY  2006   left   TONSILLECTOMY AND ADENOIDECTOMY  1944   TOTAL HIP ARTHROPLASTY Left 09/12/2013   Procedure: LEFT TOTAL HIP ARTHROPLASTY ANTERIOR APPROACH;  Surgeon: [name], MD;  Location: [place];  Service: Orthopedics;  Laterality: Left;   TOTAL HIP ARTHROPLASTY Right 04/18/2019   Procedure: RIGHT TOTAL HIP ARTHROPLASTY ANTERIOR APPROACH;  Surgeon: Melrose Nakayama, MD;  [facility];  Service: Orthopedics;  Laterality: Right;   TOTAL HIP ARTHROPLASTY Right 08/15/2019   Procedure: TOTAL HIP ARTHROPLASTY ANTERIOR APPROACH;  Surgeon: Melrose Nakayama, MD;  [facility];   Service: Orthopedics;  Laterality: Right;   TRAPEZIUM RESECTION  2000, 2004   removal right & left hand trapezium   TUBAL LIGATION     uterine polyps  2008   vaginal cyst removed  1962    There were no vitals filed for this visit.   Subjective Assessment - 06/05/20 0842    Subjective Pt reports no pain or falls since last visit. Is doing well overall, completing HEP and reports she is feeling more steady    Pertinent History Pt is a 84 year old female familiar with this clinic, d/c for L knee pain May 2021. Reports she has had 3 falls in past 6 months. All falls occur while walking, feels like her toes drag and get caught. She falls into the floor and reports she is unable to get up without something to hold onto. On July 4 she fell in the grass at a party, and had to have people help her up. When she has fallen at home she has been able to get herself to a chair or couch to get up modI ("very slowly"). PMH of bilat pedal neuropathy. She is completing exercises 4-5/week from previous HEP and yoga. She is retired, enjoys yoga, kniting. Reports she has had L hip pain over the past 6 months, when laying on L side 4/10, but does not bother her any other time. Pt denies N/V, B&B changes, unexplained weight fluctuation, saddle paresthesia, fever, night sweats, or unrelenting night pain at this time.    Limitations Lifting;Walking;House hold activities    How long can you sit comfortably? unlimited    How long can you stand comfortably? unlimited    How long can you walk comfortably? 61mins    Diagnostic tests None to date    Patient Stated Goals Stop tripping over "nothing" and increase balance            Ther-Ex Walking up 5% grade 1.54mph for 67mins; 10% grade 1.89mph 34mins  for increased foot clearance with good carry over Following G3 post talocrual mobilization with movement 4x 30sec and able to complete supine DF x15 with increased mobility following; with RTB resistance x12 with cuing  for full ROM  Duck walks 4x 56ft with CGA for safety, better balance this session  Foot up on 2nd stair into DF stretch, gastroc stretch of LE on floor x12 bilat 2-3sec hold SLS  2x 30sec with no- 2 finger support more support needed on LLE Standing on foam  in semi tandem/stagger x12; with more narrow semi-tandem x12 With CGA- minA for safety PT throwing slightly outside BOS with cuing for trunk rotation with decent carry  over Dynamic balance with 6in hurdle negotiation over 35ft with cuing for foot clearance without "whip" compensation with good carry over                           PT Education - 06/05/20 1025    Education Details therex form/technique    Person(s) Educated Patient    Methods Explanation;Demonstration;Verbal cues    Comprehension Verbalized understanding;Returned demonstration;Verbal cues required            PT Short Term Goals - 05/21/20 1023      PT SHORT TERM GOAL #1   Title Pt will be independent with HEP in order to improve strength and decrease back pain in order to improve pain-free function at home and work.    Baseline 10/19//21 HEP given    Time 4    Period Weeks    Status New             PT Long Term Goals - 05/21/20 1024      PT LONG TERM GOAL #1   Title Patient will increase FOTO score to 58 to demonstrate predicted increase in functional mobility to complete ADLs    Baseline 05/21/20 70    Time 8    Period Weeks    Status New      PT LONG TERM GOAL #2   Title Pt will decrease 5TSTS by at least 3 seconds in order to demonstrate clinically significant improvement in LE strength.    Baseline 05/20/20 15sec    Time 8    Period Weeks    Status New      PT LONG TERM GOAL #3   Title Pt will improve FGA by at least 3 points in order to demonstrate clinically significant improvement in balance and decreased risk for falls.    Baseline 05/20/20 19/30    Time 8    Period Weeks    Status New                  Plan - 06/05/20 1028    Clinical Impression Statement PT continued therex progression for increased ankle mobility/strength, dynamic balance, with carry over into functional gait. Pt is able to demonstrate good understanding of all cuing for proper technique of therex, with good motivation throughout session. PT will continue progression as able.    Personal Factors and Comorbidities Comorbidity 1;Comorbidity 2;Comorbidity 3+;Age;Fitness;Past/Current Experience;Sex    Comorbidities bilat neuropathy, arthritis, anemia, history of cancer    Examination-Activity Limitations Lift;Squat;Locomotion Level;Stairs;Reach Overhead;Stand    Examination-Participation Restrictions Meal Prep;Community Activity;Cleaning;Laundry;Yard Work    [employer] officer Evolving/Moderate complexity    Clinical Decision Making Moderate    Rehab Potential Good    PT Frequency 2x / week    PT Duration 8 weeks    PT Treatment/Interventions ADLs/Self Care Home Management;Cryotherapy;Ultrasound;Stair training;Balance training;Dry needling;Moist Heat;Traction;Gait training;Therapeutic exercise;Patient/family education;Manual techniques;Passive range of motion;Electrical Stimulation;Functional mobility training;Therapeutic activities;Neuromuscular re-education;Energy conservation;Joint Manipulations;Spinal Manipulations    PT Next Visit Plan high level gait training    PT Home Exercise Plan SLS, DF, gastroc stretch    Consulted and Agree with Plan of Care Patient           Patient will benefit from skilled therapeutic intervention in order to improve the following deficits and impairments:  Abnormal gait, Decreased balance, Decreased endurance, Decreased mobility, Difficulty walking, Decreased range of motion, Improper body mechanics, Pain, Postural dysfunction, Impaired flexibility, Increased fascial restricitons, Decreased strength, Decreased coordination,  Decreased activity tolerance  Visit Diagnosis: Other  abnormalities of gait and mobility  Repeated falls     Problem List Patient Active Problem List   Diagnosis Date Noted   History of revision of total replacement of right hip joint 08/15/2019   Primary localized osteoarthritis of right hip 04/18/2019   Primary osteoarthritis of right hip 04/18/2019   Pectus excavatum 10/12/2017   Congenital ichthyosis 11/05/2016   Perimenopausal atrophic vaginitis 09/22/2016   Menopausal hot flushes 09/22/2016   Mitral valve prolapse 01/13/2016   Herniation of nucleus pulposus 05/01/2015   Hypercholesteremia 05/01/2015   Osteoarthritis of both knees 05/01/2015   History of migraine headaches 05/01/2015   Esophageal reflux 01/10/2015   S/P total hip arthroplasty 09/12/2013   Durwin Reges DPT Durwin Reges 06/05/2020, 10:32 AM  [facility] [address] [place], [address] Phone: [phone]   Fax:  [phone]  Name: RANEY ANTWINE MRN: [id number] Date of Birth: 12-28-1935

## 2020-06-07 DIAGNOSIS — M19012 Primary osteoarthritis, left shoulder: Secondary | ICD-10-CM | POA: Diagnosis not present

## 2020-06-10 ENCOUNTER — Encounter: Payer: Self-pay | Admitting: Physical Therapy

## 2020-06-10 ENCOUNTER — Ambulatory Visit: Payer: PPO | Admitting: Physical Therapy

## 2020-06-10 ENCOUNTER — Other Ambulatory Visit: Payer: Self-pay

## 2020-06-10 DIAGNOSIS — R2689 Other abnormalities of gait and mobility: Secondary | ICD-10-CM | POA: Diagnosis not present

## 2020-06-10 DIAGNOSIS — R262 Difficulty in walking, not elsewhere classified: Secondary | ICD-10-CM

## 2020-06-10 DIAGNOSIS — R296 Repeated falls: Secondary | ICD-10-CM

## 2020-06-10 NOTE — Therapy (Signed)
[facility] [address], [place], [address] Phone: [phone]   Fax:  [phone]  Physical Therapy Treatment  Patient Details  Name: Wendy Patton MRN: [id number] Date of Birth: April 18, 1936 No data recorded  Encounter Date: 06/10/2020   PT End of Session - 06/10/20 0821    Visit Number 5    Number of Visits 17    Date for PT Re-Evaluation 07/19/20    Authorization - Visit Number 5    Authorization - Number of Visits 10    PT Start Time 0734    PT Stop Time 0813    PT Time Calculation (min) 39 min    Equipment Utilized During Treatment Gait belt    Behavior During Therapy [facility] for tasks assessed/performed           Past Medical History:  Diagnosis Date  . Anemia    after jaw surgery was on iron for short period of time  . Arthritis   . Cancer (HCC)    squamous cell  . Difficult intubation    potential for difficult airway due to limited mouth opening  . Dry skin   . GERD (gastroesophageal reflux disease)    takes Aciphex daily  . Headache(784.0)    hx of migraines   . Heart murmur   . History of migraine    last one Aug 2014 and takes Imitrex daily as needed  . History of staph infection   . IBS (irritable bowel syndrome)   . Joint pain   . Joint swelling   . Laryngopharyngeal reflux   . MVP (mitral valve prolapse)   . Neuralgia   . Neuropathy    takes gabapentin daily  . PONV (postoperative nausea and vomiting)    TMJ surgery several times  . Rheumatic fever    as a child  . Shortness of breath    with exertion  . Thyroid disease     Past Surgical History:  Procedure Laterality Date  . ABDOMINAL HYSTERECTOMY  2009  . BREAST DUCTAL SYSTEM EXCISION Right 01/11/2014   Procedure: MAJOR DUCT EXCISION RIGHT BREAST;  Surgeon: Stark Klein, MD;  Location: [facility];  Service: General;  Laterality: Right;  . BREAST EXCISIONAL BIOPSY Right 2015   neg  . BREAST EXCISIONAL BIOPSY Left  2010   benign papilloma removed  . BREAST EXCISIONAL BIOPSY Left 1975   neg  . BREAST SURGERY  1975, 2010   benign breast tumor  . breat excision  1975   benign breast tumor excision  . CATARACT EXTRACTION W/PHACO Right 07/15/2016   Procedure: CATARACT EXTRACTION PHACO AND INTRAOCULAR LENS PLACEMENT (IOC);  Surgeon: Estill Cotta, MD;  Location: [facility];  Service: Ophthalmology;  Laterality: Right;  Lot # [id number] H [place]: 01:38.6 AP%: 25.0 CDE: 46.89  . CATARACT EXTRACTION W/PHACO Left 09/02/2016   Procedure: CATARACT EXTRACTION PHACO AND INTRAOCULAR LENS PLACEMENT (IOC);  Surgeon: Estill Cotta, MD;  Location: [facility];  Service: Ophthalmology;  Laterality: Left;  [place] 2:05.3 AP% 24.9 CDE 51.84 Fluid pack lot # [id number]  . cervial polyps  1999  . [place]  . COLONOSCOPY    . CYSTECTOMY  1962   vaginal cyst removal  . DILATION AND CURETTAGE OF UTERUS  1984  . ESOPHAGOGASTRODUODENOSCOPY    . granuloma  2002   chin granuloma removed  . [place], 2002, 2004   prosthesis, condyles implants, fossa replaced  .  REVISION TOTAL KNEE ARTHROPLASTY    . SIGMOIDOSCOPY  1993  . SQUAMOUS CELL CARCINOMA EXCISION  2008   left ankle/skin graft  . [place]  . THYROIDECTOMY  2006   left  . TONSILLECTOMY AND ADENOIDECTOMY  1944  . TOTAL HIP ARTHROPLASTY Left 09/12/2013   Procedure: LEFT TOTAL HIP ARTHROPLASTY ANTERIOR APPROACH;  Surgeon: [name], MD;  Location: [facility];  Service: Orthopedics;  Laterality: Left;  . TOTAL HIP ARTHROPLASTY Right 04/18/2019   Procedure: RIGHT TOTAL HIP ARTHROPLASTY ANTERIOR APPROACH;  Surgeon: Melrose Nakayama, MD;  [facility];  Service: Orthopedics;  Laterality: Right;  . TOTAL HIP ARTHROPLASTY Right 08/15/2019   Procedure: TOTAL HIP ARTHROPLASTY ANTERIOR APPROACH;  Surgeon: Melrose Nakayama, MD;  [facility];  Service: Orthopedics;  Laterality: Right;  . TRAPEZIUM  RESECTION  2000, 2004   removal right & left hand trapezium  . TUBAL LIGATION    . uterine polyps  2008  . vaginal cyst removed  1962    There were no vitals filed for this visit.   Subjective Assessment - 06/10/20 0737    Subjective Patient reports that she feels that her balance is getting better and that she is progressing and feels more steady. Pt reports she has been doing duck walks and tandem walking at home and it has gotten easier.    Pertinent History Pt is a 84 year old female familiar with this clinic, d/c for L knee pain May 2021. Reports she has had 3 falls in past 6 months. All falls occur while walking, feels like her toes drag and get caught. She falls into the floor and reports she is unable to get up without something to hold onto. On July 4 she fell in the grass at a party, and had to have people help her up. When she has fallen at home she has been able to get herself to a chair or couch to get up modI ("very slowly"). PMH of bilat pedal neuropathy. She is completing exercises 4-5/week from previous HEP and yoga. She is retired, enjoys yoga, kniting. Reports she has had L hip pain over the past 6 months, when laying on L side 4/10, but does not bother her any other time. Pt denies N/V, B&B changes, unexplained weight fluctuation, saddle paresthesia, fever, night sweats, or unrelenting night pain at this time.    Limitations Lifting;Walking;House hold activities    How long can you sit comfortably? unlimited    How long can you stand comfortably? unlimited    How long can you walk comfortably? 43mins    Diagnostic tests None to date    Patient Stated Goals Stop tripping over "nothing" and increase balance    Currently in Pain? No/denies              Ther-Ex Walking up 7% grade 1.40mph for 41mins; 10% grade 1.72mph 31mins for increased foot clearance with good carry over Following G3 post talocrual mobilization with movement 4x 30sec and able to complete supine DF x15 with  increased mobility following; with RTB resistance 2x12 with cuing for full ROM Duck walks 4x 66ft with CGA for safety, better balance this session  Tandem walking  Foot up on 2nd stair into DF stretch, gastroc stretch of LE on floor x12 bilat 2-3sec hold SLS  2x 30sec with no- 2 finger support  SLS on foam RLE 2x30sec, LLE 13sec, 15sec, 22sec Rebounder, standing on foam feet together with yellow ball, 1x12  -  pt reported difficulty catching the ball d/t arthritic shoulder that she recently received a steroid injection on  Stepping over cone with heel tap with cueing for foot clearance 2x12 on LLE                           PT Education - 06/10/20 0820    Education Details therex form/technique    Person(s) Educated Patient    Methods Explanation;Demonstration;Verbal cues    Comprehension Verbalized understanding;Returned demonstration;Verbal cues required            PT Short Term Goals - 05/21/20 1023      PT SHORT TERM GOAL #1   Title Pt will be independent with HEP in order to improve strength and decrease back pain in order to improve pain-free function at home and work.    Baseline 10/19//21 HEP given    Time 4    Period Weeks    Status New             PT Long Term Goals - 05/21/20 1024      PT LONG TERM GOAL #1   Title Patient will increase FOTO score to 58 to demonstrate predicted increase in functional mobility to complete ADLs    Baseline 05/21/20 70    Time 8    Period Weeks    Status New      PT LONG TERM GOAL #2   Title Pt will decrease 5TSTS by at least 3 seconds in order to demonstrate clinically significant improvement in LE strength.    Baseline 05/20/20 15sec    Time 8    Period Weeks    Status New      PT LONG TERM GOAL #3   Title Pt will improve FGA by at least 3 points in order to demonstrate clinically significant improvement in balance and decreased risk for falls.    Baseline 05/20/20 19/30    Time 8    Period Weeks     Status New                 Plan - 06/10/20 9767    Clinical Impression Statement Pt continued to work on therex progression to work on ankle mobility, dynamic and static balance, and gait deviations. Pt is showing good progression with dynamic balance and able to complete duck walks and tandem walking with less guarding. Pt continues to have some difficulty with toe clearance on LLE when walking. PT will continue to progress as able.    Personal Factors and Comorbidities Comorbidity 1;Comorbidity 2;Comorbidity 3+;Age;Fitness;Past/Current Experience;Sex    Examination-Activity Limitations Lift;Squat;Locomotion Level;Stairs;Reach Overhead;Stand    Examination-Participation Restrictions Meal Prep;Community Activity;Cleaning;Laundry;Yard Work    Stability/Clinical Decision Making Evolving/Moderate complexity    Clinical Decision Making Moderate    PT Frequency 2x / week    PT Duration 8 weeks    PT Treatment/Interventions ADLs/Self Care Home Management;Cryotherapy;Ultrasound;Stair training;Balance training;Dry needling;Moist Heat;Traction;Gait training;Therapeutic exercise;Patient/family education;Manual techniques;Passive range of motion;Electrical Stimulation;Functional mobility training;Therapeutic activities;Neuromuscular re-education;Energy conservation;Joint Manipulations;Spinal Manipulations    PT Next Visit Plan high level gait training, great toe extension strengthening    PT Home Exercise Plan SLS, DF, gastroc stretch    Consulted and Agree with Plan of Care Patient           Patient will benefit from skilled therapeutic intervention in order to improve the following deficits and impairments:  Abnormal gait, Decreased balance, Decreased endurance, Decreased mobility, Difficulty walking, Decreased range of motion,  Improper body mechanics, Pain, Postural dysfunction, Impaired flexibility, Increased fascial restricitons, Decreased strength, Decreased coordination, Decreased activity  tolerance  Visit Diagnosis: Other abnormalities of gait and mobility  Difficulty in walking, not elsewhere classified  Repeated falls     Problem List Patient Active Problem List   Diagnosis Date Noted  . History of revision of total replacement of right hip joint 08/15/2019  . Primary localized osteoarthritis of right hip 04/18/2019  . Primary osteoarthritis of right hip 04/18/2019  . Pectus excavatum 10/12/2017  . 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 Perimenopausal atrophic vaginitis 09/22/2016  . Menopausal hot flushes 09/22/2016  . Mitral valve prolapse 01/13/2016  . Herniation of nucleus pulposus 05/01/2015  . Hypercholesteremia 05/01/2015  . Osteoarthritis of both knees 05/01/2015  . History of migraine headaches 05/01/2015  . Esophageal reflux 01/10/2015  . S/P total hip arthroplasty 09/12/2013    Durwin Reges DPT [facility], SPT  Durwin Reges 06/10/2020, 11:38 AM  [facility] [address] [place], [address] Phone: [phone]   Fax:  [phone]  Name: ELLISE KOVACK MRN: [id number] Date of Birth: 11-Dec-1935

## 2020-06-12 ENCOUNTER — Encounter: Payer: Self-pay | Admitting: Physical Therapy

## 2020-06-12 ENCOUNTER — Other Ambulatory Visit: Payer: Self-pay

## 2020-06-12 ENCOUNTER — Ambulatory Visit: Payer: PPO | Admitting: Physical Therapy

## 2020-06-12 DIAGNOSIS — R2689 Other abnormalities of gait and mobility: Secondary | ICD-10-CM

## 2020-06-12 DIAGNOSIS — R262 Difficulty in walking, not elsewhere classified: Secondary | ICD-10-CM

## 2020-06-12 DIAGNOSIS — R296 Repeated falls: Secondary | ICD-10-CM

## 2020-06-12 NOTE — Therapy (Signed)
[facility] [facility] [address], [place], [address] Phone: [phone]   Fax:  [phone]  Physical Therapy Treatment  Patient Details  Name: Wendy Patton MRN: [id number] Date of Birth: 07/13/1936 No data recorded  Encounter Date: 06/12/2020   PT End of Session - 06/12/20 0737    Visit Number 6    Number of Visits 17    Date for PT Re-Evaluation 07/19/20    Authorization - Visit Number 6    Authorization - Number of Visits 10    PT Start Time 0733    PT Stop Time 0814    PT Time Calculation (min) 41 min    Equipment Utilized During Treatment Gait belt    Activity Tolerance Patient tolerated treatment well    Behavior During Therapy [facility] for tasks assessed/performed           Past Medical History:  Diagnosis Date  . Anemia    after jaw surgery was on iron for short period of time  . Arthritis   . Cancer (HCC)    squamous cell  . Difficult intubation    potential for difficult airway due to limited mouth opening  . Dry skin   . GERD (gastroesophageal reflux disease)    takes Aciphex daily  . Headache(784.0)    hx of migraines   . Heart murmur   . History of migraine    last one Aug 2014 and takes Imitrex daily as needed  . History of staph infection   . IBS (irritable bowel syndrome)   . Joint pain   . Joint swelling   . Laryngopharyngeal reflux   . MVP (mitral valve prolapse)   . Neuralgia   . Neuropathy    takes gabapentin daily  . PONV (postoperative nausea and vomiting)    TMJ surgery several times  . Rheumatic fever    as a child  . Shortness of breath    with exertion  . Thyroid disease     Past Surgical History:  Procedure Laterality Date  . ABDOMINAL HYSTERECTOMY  2009  . BREAST DUCTAL SYSTEM EXCISION Right 01/11/2014   Procedure: MAJOR DUCT EXCISION RIGHT BREAST;  Surgeon: Stark Klein, MD;  [facility]: [facility];  Service: General;  Laterality: Right;  . BREAST EXCISIONAL  BIOPSY Right 2015   neg  . BREAST EXCISIONAL BIOPSY Left 2010   benign papilloma removed  . BREAST EXCISIONAL BIOPSY Left 1975   neg  . BREAST SURGERY  1975, 2010   benign breast tumor  . breat excision  1975   benign breast tumor excision  . CATARACT EXTRACTION W/PHACO Right 07/15/2016   Procedure: CATARACT EXTRACTION PHACO AND INTRAOCULAR LENS PLACEMENT (IOC);  Surgeon: Estill Cotta, MD;  Location: [facility];  Service: Ophthalmology;  Laterality: Right;  Lot # [id number] H [place]: 01:38.6 AP%: 25.0 CDE: 46.89  . CATARACT EXTRACTION W/PHACO Left 09/02/2016   Procedure: CATARACT EXTRACTION PHACO AND INTRAOCULAR LENS PLACEMENT (IOC);  Surgeon: Estill Cotta, MD;  Location: [facility];  Service: Ophthalmology;  Laterality: Left;  [place] 2:05.3 AP% 24.9 CDE 51.84 Fluid pack lot # [id number]  . cervial polyps  1999  . [place]  . COLONOSCOPY    . CYSTECTOMY  1962   vaginal cyst removal  . DILATION AND CURETTAGE OF UTERUS  1984  . ESOPHAGOGASTRODUODENOSCOPY    . granuloma  2002   chin granuloma removed  . [place],  2002, 2004   prosthesis, condyles implants, fossa replaced  . REVISION TOTAL KNEE ARTHROPLASTY    . SIGMOIDOSCOPY  1993  . SQUAMOUS CELL CARCINOMA EXCISION  2008   left ankle/skin graft  . [place]  . THYROIDECTOMY  2006   left  . TONSILLECTOMY AND ADENOIDECTOMY  1944  . TOTAL HIP ARTHROPLASTY Left 09/12/2013   Procedure: LEFT TOTAL HIP ARTHROPLASTY ANTERIOR APPROACH;  Surgeon: [name], MD;  Location: [facility];  Service: Orthopedics;  Laterality: Left;  . TOTAL HIP ARTHROPLASTY Right 04/18/2019   Procedure: RIGHT TOTAL HIP ARTHROPLASTY ANTERIOR APPROACH;  Surgeon: Melrose Nakayama, MD;  [facility];  Service: Orthopedics;  Laterality: Right;  . TOTAL HIP ARTHROPLASTY Right 08/15/2019   Procedure: TOTAL HIP ARTHROPLASTY ANTERIOR APPROACH;  Surgeon: Melrose Nakayama, MD;  [facility];   Service: Orthopedics;  Laterality: Right;  . TRAPEZIUM RESECTION  2000, 2004   removal right & left hand trapezium  . TUBAL LIGATION    . uterine polyps  2008  . vaginal cyst removed  1962    There were no vitals filed for this visit.   Subjective Assessment - 06/12/20 0738    Subjective Pt reports she feels much more confedient with walking, without toe drag. She is able to put her pants on standing which is an improvement    Pertinent History Pt is a 84 year old female familiar with this clinic, d/c for L knee pain May 2021. Reports she has had 3 falls in past 6 months. All falls occur while walking, feels like her toes drag and get caught. She falls into the floor and reports she is unable to get up without something to hold onto. On July 4 she fell in the grass at a party, and had to have people help her up. When she has fallen at home she has been able to get herself to a chair or couch to get up modI ("very slowly"). PMH of bilat pedal neuropathy. She is completing exercises 4-5/week from previous HEP and yoga. She is retired, enjoys yoga, kniting. Reports she has had L hip pain over the past 6 months, when laying on L side 4/10, but does not bother her any other time. Pt denies N/V, B&B changes, unexplained weight fluctuation, saddle paresthesia, fever, night sweats, or unrelenting night pain at this time.    Limitations Lifting;Walking;House hold activities    How long can you sit comfortably? unlimited    How long can you stand comfortably? unlimited    How long can you walk comfortably? 58mins    Diagnostic tests None to date    Patient Stated Goals Stop tripping over "nothing" and increase balance           Ther-Ex Walking up 7% grade 1.55mphfor 20mins; 10% grade 1.74mph 25minsfor increased foot clearance with good carry over Following G3 post talocrual mobilization with movement foot up on 2nd stair into DF DF <> PF on bosu hardside in sitting x20  Duck walks 2x 33ft with CGA  for safety,better balance this session  Tandem walking 2x 35ft with CGA for safety   of LE on floor x12 bilat 2-3sec hold SLS 30sec withno support RLE; intermittent 1 finger on LLE SLS on foam x30sec with intermittent 1-31finger support Narrow BOS on foam (attempted SLS with CL toe down unable) drawing alphabet with BUE with encouragement for increased trunk motion for "larger" letters with good carry over Gastroc stretch on slant board x82min  PT Education - 06/12/20 0740    Education Details therex fomr/technique    Person(s) Educated Patient    Methods Explanation;Verbal cues;Demonstration    Comprehension Verbalized understanding;Returned demonstration;Verbal cues required            PT Short Term Goals - 05/21/20 1023      PT SHORT TERM GOAL #1   Title Pt will be independent with HEP in order to improve strength and decrease back pain in order to improve pain-free function at home and work.    Baseline 10/19//21 HEP given    Time 4    Period Weeks    Status New             PT Long Term Goals - 05/21/20 1024      PT LONG TERM GOAL #1   Title Patient will increase FOTO score to 58 to demonstrate predicted increase in functional mobility to complete ADLs    Baseline 05/21/20 70    Time 8    Period Weeks    Status New      PT LONG TERM GOAL #2   Title Pt will decrease 5TSTS by at least 3 seconds in order to demonstrate clinically significant improvement in LE strength.    Baseline 05/20/20 15sec    Time 8    Period Weeks    Status New      PT LONG TERM GOAL #3   Title Pt will improve FGA by at least 3 points in order to demonstrate clinically significant improvement in balance and decreased risk for falls.    Baseline 05/20/20 19/30    Time 8    Period Weeks    Status New                 Plan - 06/12/20 0809    Clinical Impression Statement PT continued therex progression for increased dynamic ankle  mobility, proprioception, and dynamic balance with success. Pt is continuing to demonstrate good carry over of all cuing for proper technique of therex with good motivation throughout session. Pt demonstrates good carry over of therex between sessions as well. PT will continue progression as able.    Personal Factors and Comorbidities Comorbidity 1;Comorbidity 2;Comorbidity 3+;Age;Fitness;Past/Current Experience;Sex    Comorbidities bilat neuropathy, arthritis, anemia, history of cancer    Examination-Activity Limitations Lift;Squat;Locomotion Level;Stairs;Reach Overhead;Stand    Examination-Participation Restrictions Meal Prep;Community Activity;Cleaning;Laundry;Yard Work    [employer] Evolving/Moderate complexity    Clinical Decision Making Moderate    Rehab Potential Good    PT Frequency 2x / week    PT Duration 8 weeks    PT Treatment/Interventions ADLs/Self Care Home Management;Cryotherapy;Ultrasound;Stair training;Balance training;Dry needling;Moist Heat;Traction;Gait training;Therapeutic exercise;Patient/family education;Manual techniques;Passive range of motion;Electrical Stimulation;Functional mobility training;Therapeutic activities;Neuromuscular re-education;Energy conservation;Joint Manipulations;Spinal Manipulations    PT Next Visit Plan high level gait training, great toe extension strengthening    PT Home Exercise Plan SLS, DF, gastroc stretch    Consulted and Agree with Plan of Care Patient           Patient will benefit from skilled therapeutic intervention in order to improve the following deficits and impairments:  Abnormal gait, Decreased balance, Decreased endurance, Decreased mobility, Difficulty walking, Decreased range of motion, Improper body mechanics, Pain, Postural dysfunction, Impaired flexibility, Increased fascial restricitons, Decreased strength, Decreased coordination, Decreased activity tolerance  Visit Diagnosis: Other abnormalities of  gait and mobility  Difficulty in walking, not elsewhere classified  Repeated falls     Problem List Patient Active Problem  List   Diagnosis Date Noted  . History of revision of total replacement of right hip joint 08/15/2019  . Primary localized osteoarthritis of right hip 04/18/2019  . Primary osteoarthritis of right hip 04/18/2019  . Pectus excavatum 10/12/2017  . 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 Perimenopausal atrophic vaginitis 09/22/2016  . Menopausal hot flushes 09/22/2016  . Mitral valve prolapse 01/13/2016  . Herniation of nucleus pulposus 05/01/2015  . Hypercholesteremia 05/01/2015  . Osteoarthritis of both knees 05/01/2015  . History of migraine headaches 05/01/2015  . Esophageal reflux 01/10/2015  . S/P total hip arthroplasty 09/12/2013   Durwin Reges DPT Durwin Reges 06/12/2020, 8:18 AM  [place] [facility] [address], [place], [address] Phone: [phone]   Fax:  [phone]  Name: Wendy Patton MRN: [id number] Date of Birth: 09/21/1935

## 2020-06-17 ENCOUNTER — Ambulatory Visit: Payer: PPO | Admitting: Physical Therapy

## 2020-06-17 ENCOUNTER — Other Ambulatory Visit: Payer: Self-pay

## 2020-06-17 ENCOUNTER — Other Ambulatory Visit: Payer: Self-pay | Admitting: Physician Assistant

## 2020-06-17 ENCOUNTER — Encounter: Payer: Self-pay | Admitting: Physical Therapy

## 2020-06-17 DIAGNOSIS — R262 Difficulty in walking, not elsewhere classified: Secondary | ICD-10-CM

## 2020-06-17 DIAGNOSIS — R2689 Other abnormalities of gait and mobility: Secondary | ICD-10-CM | POA: Diagnosis not present

## 2020-06-17 DIAGNOSIS — Z79890 Hormone replacement therapy (postmenopausal): Secondary | ICD-10-CM

## 2020-06-17 DIAGNOSIS — R296 Repeated falls: Secondary | ICD-10-CM

## 2020-06-17 NOTE — Therapy (Signed)
[facility] [address], [place], [address] Phone: [phone]   Fax:  [phone]  Physical Therapy Treatment  Patient Details  Name: Wendy Patton MRN: [id number] Date of Birth: 1936-02-09 No data recorded  Encounter Date: 06/17/2020   PT End of Session - 06/17/20 0832    Visit Number 7    Number of Visits 17    Date for PT Re-Evaluation 07/19/20    Authorization - Visit Number 7    Authorization - Number of Visits 10    PT Start Time 0825    PT Stop Time 0903    PT Time Calculation (min) 38 min    Equipment Utilized During Treatment Gait belt    Activity Tolerance Patient tolerated treatment well    Behavior During Therapy [facility] for tasks assessed/performed           Past Medical History:  Diagnosis Date  . Anemia    after jaw surgery was on iron for short period of time  . Arthritis   . Cancer (HCC)    squamous cell  . Difficult intubation    potential for difficult airway due to limited mouth opening  . Dry skin   . GERD (gastroesophageal reflux disease)    takes Aciphex daily  . Headache(784.0)    hx of migraines   . Heart murmur   . History of migraine    last one Aug 2014 and takes Imitrex daily as needed  . History of staph infection   . IBS (irritable bowel syndrome)   . Joint pain   . Joint swelling   . Laryngopharyngeal reflux   . MVP (mitral valve prolapse)   . Neuralgia   . Neuropathy    takes gabapentin daily  . PONV (postoperative nausea and vomiting)    TMJ surgery several times  . Rheumatic fever    as a child  . Shortness of breath    with exertion  . Thyroid disease     Past Surgical History:  Procedure Laterality Date  . ABDOMINAL HYSTERECTOMY  2009  . BREAST DUCTAL SYSTEM EXCISION Right 01/11/2014   Procedure: MAJOR DUCT EXCISION RIGHT BREAST;  Surgeon: Stark Klein, MD;  Location: [facility];  Service: General;  Laterality: Right;  . BREAST EXCISIONAL  BIOPSY Right 2015   neg  . BREAST EXCISIONAL BIOPSY Left 2010   benign papilloma removed  . BREAST EXCISIONAL BIOPSY Left 1975   neg  . BREAST SURGERY  1975, 2010   benign breast tumor  . breat excision  1975   benign breast tumor excision  . CATARACT EXTRACTION W/PHACO Right 07/15/2016   Procedure: CATARACT EXTRACTION PHACO AND INTRAOCULAR LENS PLACEMENT (IOC);  Surgeon: Estill Cotta, MD;  Location: [facility];  Service: Ophthalmology;  Laterality: Right;  Lot # [id number] H [place]: 01:38.6 AP%: 25.0 CDE: 46.89  . CATARACT EXTRACTION W/PHACO Left 09/02/2016   Procedure: CATARACT EXTRACTION PHACO AND INTRAOCULAR LENS PLACEMENT (IOC);  Surgeon: Estill Cotta, MD;  Location: [facility];  Service: Ophthalmology;  Laterality: Left;  [place] 2:05.3 AP% 24.9 CDE 51.84 Fluid pack lot # [id number]  . cervial polyps  1999  . [place]  . COLONOSCOPY    . CYSTECTOMY  1962   vaginal cyst removal  . DILATION AND CURETTAGE OF UTERUS  1984  . ESOPHAGOGASTRODUODENOSCOPY    . granuloma  2002   chin granuloma removed  . [place],  2002, 2004   prosthesis, condyles implants, fossa replaced  . REVISION TOTAL KNEE ARTHROPLASTY    . SIGMOIDOSCOPY  1993  . SQUAMOUS CELL CARCINOMA EXCISION  2008   left ankle/skin graft  . [place]  . THYROIDECTOMY  2006   left  . TONSILLECTOMY AND ADENOIDECTOMY  1944  . TOTAL HIP ARTHROPLASTY Left 09/12/2013   Procedure: LEFT TOTAL HIP ARTHROPLASTY ANTERIOR APPROACH;  Surgeon: [name], MD;  Location: [facility];  Service: Orthopedics;  Laterality: Left;  . TOTAL HIP ARTHROPLASTY Right 04/18/2019   Procedure: RIGHT TOTAL HIP ARTHROPLASTY ANTERIOR APPROACH;  Surgeon: Melrose Nakayama, MD;  Location: [facility];  Service: Orthopedics;  Laterality: Right;  . TOTAL HIP ARTHROPLASTY Right 08/15/2019   Procedure: TOTAL HIP ARTHROPLASTY ANTERIOR APPROACH;  Surgeon: Melrose Nakayama, MD;  Location: [facility];   Service: Orthopedics;  Laterality: Right;  . TRAPEZIUM RESECTION  2000, 2004   removal right & left hand trapezium  . TUBAL LIGATION    . uterine polyps  2008  . vaginal cyst removed  1962    There were no vitals filed for this visit.   Subjective Assessment - 06/17/20 0829    Subjective Patient reports feeling about 50% better overall, and no balance issues.    Pertinent History Pt is a 84 year old female familiar with this clinic, d/c for L knee pain May 2021. Reports she has had 3 falls in past 6 months. All falls occur while walking, feels like her toes drag and get caught. She falls into the floor and reports she is unable to get up without something to hold onto. On July 4 she fell in the grass at a party, and had to have people help her up. When she has fallen at home she has been able to get herself to a chair or couch to get up modI ("very slowly"). PMH of bilat pedal neuropathy. She is completing exercises 4-5/week from previous HEP and yoga. She is retired, enjoys yoga, kniting. Reports she has had L hip pain over the past 6 months, when laying on L side 4/10, but does not bother her any other time. Pt denies N/V, B&B changes, unexplained weight fluctuation, saddle paresthesia, fever, night sweats, or unrelenting night pain at this time.    Limitations Lifting;Walking;House hold activities    How long can you sit comfortably? unlimited    How long can you stand comfortably? unlimited    How long can you walk comfortably? 40mins    Diagnostic tests None to date    Patient Stated Goals Stop tripping over "nothing" and increase balance           Ther-Ex Walking up7% grade 1.23mphfor 28mins; 10% grade 1.41mph 91minsfor increased foot clearance with good carry over Following G3 post talocrual mobilization with movement foot up on 2nd stair into DF L; RLE just foot on step moving into lunge x15 2-5sec DF <> PF on bosu hardside in standing x20  Duck walks 2x 67ft with CGA for  safety,better balance this session  Tandem walking 4x 73ft with UEs across chest CGA for safety  Deep squat with TRX 2x 10 with increased ankle DF, some knee pain throughout, m0in cuing to prevent heel lift SLS on foam 2 x30sec with intermittent 1-64finger support Narrow BOS on foam drawing alphabet with BUE with encouragement for increased trunk motion for "larger" letters with good carry over Gastroc stretch on slant board x86min  PT Education - 06/17/20 [phone]    Education Details therex form/technique    Person(s) Educated Patient    Methods Explanation;Demonstration;Verbal cues    Comprehension Verbalized understanding;Returned demonstration;Verbal cues required            PT Short Term Goals - 05/21/20 1023      PT SHORT TERM GOAL #1   Title Pt will be independent with HEP in order to improve strength and decrease back pain in order to improve pain-free function at home and work.    Baseline 10/19//21 HEP given    Time 4    Period Weeks    Status New             PT Long Term Goals - 05/21/20 1024      PT LONG TERM GOAL #1   Title Patient will increase FOTO score to 58 to demonstrate predicted increase in functional mobility to complete ADLs    Baseline 05/21/20 70    Time 8    Period Weeks    Status New      PT LONG TERM GOAL #2   Title Pt will decrease 5TSTS by at least 3 seconds in order to demonstrate clinically significant improvement in LE strength.    Baseline 05/20/20 15sec    Time 8    Period Weeks    Status New      PT LONG TERM GOAL #3   Title Pt will improve FGA by at least 3 points in order to demonstrate clinically significant improvement in balance and decreased risk for falls.    Baseline 05/20/20 19/30    Time 8    Period Weeks    Status New                 Plan - 06/17/20 0851    Clinical Impression Statement PT continued therex progression for increased foot clearance, ankle stability, proprioception to  decrease fall risk. Patient is able to comply with all cuing for proper technique of therex with good carry over and good motivation throughout session, without pain, or LOB. PT will continue progression as able.    Personal Factors and Comorbidities Comorbidity 1;Comorbidity 2;Comorbidity 3+;Age;Fitness;Past/Current Experience;Sex    Comorbidities bilat neuropathy, arthritis, anemia, history of cancer    Examination-Activity Limitations Lift;Squat;Locomotion Level;Stairs;Reach Overhead;Stand    Examination-Participation Restrictions Meal Prep;Community Activity;Cleaning;Laundry;Yard Work    [employer] Evolving/Moderate complexity    Clinical Decision Making Moderate    Rehab Potential Good    PT Frequency 2x / week    PT Duration 8 weeks    PT Treatment/Interventions ADLs/Self Care Home Management;Cryotherapy;Ultrasound;Stair training;Balance training;Dry needling;Moist Heat;Traction;Gait training;Therapeutic exercise;Patient/family education;Manual techniques;Passive range of motion;Electrical Stimulation;Functional mobility training;Therapeutic activities;Neuromuscular re-education;Energy conservation;Joint Manipulations;Spinal Manipulations    PT Next Visit Plan high level gait training, great toe extension strengthening    PT Home Exercise Plan SLS, DF, gastroc stretch    Consulted and Agree with Plan of Care Patient           Patient will benefit from skilled therapeutic intervention in order to improve the following deficits and impairments:  Abnormal gait, Decreased balance, Decreased endurance, Decreased mobility, Difficulty walking, Decreased range of motion, Improper body mechanics, Pain, Postural dysfunction, Impaired flexibility, Increased fascial restricitons, Decreased strength, Decreased coordination, Decreased activity tolerance  Visit Diagnosis: Other abnormalities of gait and mobility  Difficulty in walking, not elsewhere classified  Repeated  falls     Problem List Patient Active Problem List   Diagnosis Date Noted  .  History of revision of total replacement of right hip joint 08/15/2019  . Primary localized osteoarthritis of right hip 04/18/2019  . Primary osteoarthritis of right hip 04/18/2019  . Pectus excavatum 10/12/2017  . 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 Perimenopausal atrophic vaginitis 09/22/2016  . Menopausal hot flushes 09/22/2016  . Mitral valve prolapse 01/13/2016  . Herniation of nucleus pulposus 05/01/2015  . Hypercholesteremia 05/01/2015  . Osteoarthritis of both knees 05/01/2015  . History of migraine headaches 05/01/2015  . Esophageal reflux 01/10/2015  . S/P total hip arthroplasty 09/12/2013   Durwin Reges DPT Durwin Reges 06/17/2020, 9:28 AM  [facility] [address], [place], [address] Phone: [phone]   Fax:  [phone]  Name: Wendy Patton MRN: [id number] Date of Birth: Feb 04, 1936

## 2020-06-17 NOTE — Telephone Encounter (Signed)
Requested medication (s) are due for refill today: yes  Requested medication (s) are on the active medication list: yes  Last refill: 03/12/20  Future visit scheduled: tomorrow  Notes to clinic:  Patient needs mammogram, OV tomorrow. Last office note states that patient to take every other day but ordered daily.    Requested Prescriptions  Pending Prescriptions Disp Refills   estradiol (ESTRACE) 1 MG tablet [Pharmacy Med Name: ESTRADIOL 1 MG TAB] 90 tablet 0    Sig: TAKE ONE TABLET EVERY DAY      OB/GYN:  Estrogens Failed - 06/17/2020 10:31 AM      Failed - Mammogram is up-to-date per Health Maintenance      Failed - Last BP in normal range    BP Readings from Last 1 Encounters:  05/10/20 (!) 143/54          Passed - Valid encounter within last 12 months    Recent Outpatient Visits           1 month ago Primary hypertension   [place], [place], [place]   2 months ago Essential hypertension   [place], [place], [place]   3 months ago Urinary frequency   [facility] [place], [place], [place]   1 year ago Pre-operative clearance   [facility] [place], [place]   1 year ago Left leg swelling   [place], [place], [place]       Future Appointments             Tomorrow Trinna Post, [place], [place]   In 5 months Terrilee Croak, [place], [place], [place]

## 2020-06-18 ENCOUNTER — Encounter: Payer: Self-pay | Admitting: Physician Assistant

## 2020-06-18 ENCOUNTER — Ambulatory Visit (INDEPENDENT_AMBULATORY_CARE_PROVIDER_SITE_OTHER): Payer: PPO | Admitting: Physician Assistant

## 2020-06-18 VITALS — BP 145/59 | HR 77 | Temp 98.6°F | Wt 133.9 lb

## 2020-06-18 DIAGNOSIS — R221 Localized swelling, mass and lump, neck: Secondary | ICD-10-CM | POA: Diagnosis not present

## 2020-06-18 DIAGNOSIS — M2540 Effusion, unspecified joint: Secondary | ICD-10-CM | POA: Diagnosis not present

## 2020-06-18 NOTE — Patient Instructions (Signed)
Arthritis Arthritis means joint pain. It can also mean joint disease. A joint is a place where bones come together. There are more than 100 types of arthritis. What are the causes? This condition may be caused by:  Wear and tear of a joint. This is the most common cause.  A lot of acid in the blood, which leads to pain in the joint (gout).  Pain and swelling (inflammation) in a joint.  Infection of a joint.  Injuries in the joint.  A reaction to medicines (allergy). In some cases, the cause may not be known. What are the signs or symptoms? Symptoms of this condition include:  Redness at a joint.  Swelling at a joint.  Stiffness at a joint.  Warmth coming from the joint.  A fever.  A feeling of being sick. How is this treated? This condition may be treated with:  Treating the cause, if it is known.  Rest.  Raising (elevating) the joint.  Putting cold or hot packs on the joint.  Medicines to treat symptoms and reduce pain and swelling.  Shots of medicines (cortisone) into the joint. You may also be told to make changes in your life, such as doing exercises and losing weight. Follow these instructions at home: Medicines  Take over-the-counter and prescription medicines only as told by your doctor.  Do not take aspirin for pain if your doctor says that you may have gout. Activity  Rest your joint if your doctor tells you to.  Avoid activities that make the pain worse.  Exercise your joint regularly as told by your doctor. Try doing exercises like: ? Swimming. ? Water aerobics. ? Biking. ? Walking. Managing pain, stiffness, and swelling      If told, put ice on the affected area. ? Put ice in a plastic bag. ? Place a towel between your skin and the bag. ? Leave the ice on for 20 minutes, 2-3 times per day.  If your joint is swollen, raise (elevate) it above the level of your heart if told by your doctor.  If your joint feels stiff in the morning,  try taking a warm shower.  If told, put heat on the affected area. Do this as often as told by your doctor. Use the heat source that your doctor recommends, such as a moist heat pack or a heating pad. If you have diabetes, do not apply heat without asking your doctor. To apply heat: ? Place a towel between your skin and the heat source. ? Leave the heat on for 20-30 minutes. ? Remove the heat if your skin turns bright red. This is very important if you are unable to feel pain, heat, or cold. You may have a greater risk of getting burned. General instructions  Do not use any products that contain nicotine or tobacco, such as cigarettes, e-cigarettes, and chewing tobacco. If you need help quitting, ask your doctor.  Keep all follow-up visits as told by your doctor. This is important. Contact a doctor if:  The pain gets worse.  You have a fever. Get help right away if:  You have very bad pain in your joint.  You have swelling in your joint.  Your joint is red.  Many joints become painful and swollen.  You have very bad back pain.  Your leg is very weak.  You cannot control your pee (urine) or poop (stool). Summary  Arthritis means joint pain. It can also mean joint disease. A joint is a place   where bones come together.  The most common cause of this condition is wear and tear of a joint.  Symptoms of this condition include redness, swelling, or stiffness of the joint.  This condition is treated with rest, raising the joint, medicines, and putting cold or hot packs on the joint.  Follow your doctor's instructions about medicines, activity, exercises, and other home care treatments. This information is not intended to replace advice given to you by your health care provider. Make sure you discuss any questions you have with your health care provider. Document Revised: 06/27/2018 Document Reviewed: 06/27/2018 Elsevier Patient Education  2020 Elsevier Inc.  

## 2020-06-18 NOTE — Progress Notes (Signed)
Established patient visit   Patient: Wendy Patton   DOB: February 27, 1936   84 y.o. Female  MRN: [id number] Visit Date: 06/18/2020  Today's healthcare provider: Trinna Post, PA-C   Chief Complaint  Patient presents with  . Ear Drainage  I,Mayci Haning M Obed Samek,acting as a scribe for Trinna Post, PA-C.,have documented all relevant documentation on the behalf of Trinna Post, PA-C,as directed by  Trinna Post, PA-C while in the presence of Trinna Post, PA-C.  Subjective    Ear Drainage  There is pain in the left ear. This is a recurrent problem. The current episode started in the past 7 days. The problem has been gradually improving. There has been no fever. The pain is at a severity of 0/10. The patient is experiencing no pain. Associated symptoms include ear discharge. Pertinent negatives include no coughing, diarrhea or neck pain. She has tried nothing for the symptoms. The treatment provided no relief.   She has a history of an inclusion cyst in her left ear. She sees Dr. Tami Ribas every 6 months due to history of jaw and ear reconstructive surgery. The past few days she has had some difficulty hearing out of her left ear. She had noticed a mass under her left jaw that was tender to touch. She reports in the past day or so there has been some green drainage from her ear and slowly her hearing has improved. The mass under hew jaw has improved as well. She denies fevers, chills, nausea, vomiting.   She is doing better with her balance with physical therapy and is pleased with the results.   She has significant arthritic changes in her hands. She has been tested for RA previously, the results of which have been indeterminate. She is interested in checking this again.        Medications: Outpatient Medications Prior to Visit  Medication Sig  . amLODipine (NORVASC) 2.5 MG tablet Take 1 tablet (2.5 mg total) by mouth daily.  . Ammonium Lactate (AMLACTIN EX) Apply 1  application topically 4 (four) times daily.  . Calcium Carb-Cholecalciferol (CALCIUM 600+D) 600-800 MG-UNIT TABS Take 1 tablet by mouth 2 (two) times daily.  . cholecalciferol (VITAMIN D3) 25 MCG (1000 UNIT) tablet Take 1,000 Units by mouth daily.  . cycloSPORINE (RESTASIS) 0.05 % ophthalmic emulsion Place 1 drop into both eyes 2 (two) times daily.   Marland Kitchen estradiol (ESTRACE) 1 MG tablet Take 1 mg by mouth every other day.  . Glucosamine HCl 1000 MG TABS Take 1,000 mg by mouth daily.  Marland Kitchen losartan (COZAAR) 25 MG tablet Take 1 tablet (25 mg total) by mouth daily.  . Omega-3 1000 MG CAPS Take 1,000 mg by mouth daily.   . pantoprazole (PROTONIX) 40 MG tablet Take 1 tablet (40 mg total) by mouth 2 (two) times daily.  Vladimir Faster Glycol-Propyl Glycol (SYSTANE OP) Place 1 drop into both eyes 2 (two) times daily as needed (dry eyes).   . vitamin B-12 (CYANOCOBALAMIN) 1000 MCG tablet Take 1,000 mcg by mouth daily.  . vitamin E 1000 UNIT capsule Take 1,000 Units by mouth daily.    Marland Kitchen acetaminophen (TYLENOL) 650 MG CR tablet Take 650 mg by mouth 2 (two) times daily as needed for pain.  (Patient not taking: Reported on 06/18/2020)  . diphenhydrAMINE (BENADRYL) 25 mg capsule Take 25 mg by mouth at bedtime as needed. (Patient not taking: Reported on 06/18/2020)  . sodium chloride (MURO 128) 5 % ophthalmic solution  Place 1 drop into both eyes as needed (severe dry eyes). (Patient not taking: Reported on 06/18/2020)   No facility-administered medications prior to visit.    Review of Systems  HENT: Positive for ear discharge.   Respiratory: Negative for cough.   Gastrointestinal: Negative for diarrhea.  Musculoskeletal: Negative for neck pain.       Objective    BP (!) 145/59 (BP Location: Left Arm, Patient Position: Sitting, Cuff Size: Large)   Pulse 77   Temp 98.6 F (37 C) (Oral)   Wt 133 lb 14.4 oz (60.7 kg)   SpO2 98%   BMI 19.77 kg/m     Physical Exam Constitutional:      Appearance: Normal  appearance. She is normal weight.  HENT:     Right Ear: Tympanic membrane and ear canal normal.     Left Ear: Tympanic membrane and ear canal normal.     Ears:     Comments: There is some green drainage in the left ear canal however the canal itself is not swollen or erythematous. The TM also appears normal.  Musculoskeletal:     Cervical back: Full passive range of motion without pain.  Lymphadenopathy:     Cervical: No cervical adenopathy.     Right cervical: No superficial, deep or posterior cervical adenopathy.    Left cervical: No superficial, deep or posterior cervical adenopathy.  Skin:    General: Skin is warm and dry.  Neurological:     General: No focal deficit present.     Mental Status: She is alert and oriented to person, place, and time.  Psychiatric:        Mood and Affect: Mood normal.        Behavior: Behavior normal.       Results for orders placed or performed in visit on 06/18/20  Rheumatoid Factor  Result Value Ref Range   Rhuematoid fact SerPl-aCnc <10.0 0.0 - 13.9 IU/mL  ANA  Result Value Ref Range   ANA Titer 1 WILL FOLLOW    Please Note: Comment   Sed Rate (ESR)  Result Value Ref Range   Sed Rate 12 0 - 40 mm/hr  C-reactive protein  Result Value Ref Range   CRP <1 0 - 10 mg/L  Anti-CCP Ab, IgG + IgA (RDL)  Result Value Ref Range   Anti-CCP Ab, IgG + IgA (RDL) WILL FOLLOW     Assessment & Plan    1. Neck mass  Suspect infected inclusion cyst of her left ear with consequent reactionary lymph node. No lymphadenopathy appreciated today. I think she can observe. She sees Dr. Tami Ribas in 08/2019 for her normal f/u.   2. Joint swelling  Check labs.   - Rheumatoid Factor - ANA - Sed Rate (ESR) - C-reactive protein - Anti-CCP Ab, IgG + IgA (RDL)   Return if symptoms worsen or fail to improve.      ITrinna Post, PA-C, have reviewed all documentation for this visit. The documentation on 06/19/20 for the exam, diagnosis, procedures, and  orders are all accurate and complete.  The entirety of the information documented in the History of Present Illness, Review of Systems and Physical Exam were personally obtained by me. Portions of this information were initially documented by [facility] and reviewed by me for thoroughness and accuracy.   I spent 20 minutes dedicated to the care of this patient on the date of this encounter to include pre-visit review of records, face-to-face time with  the patient discussing lymphadenopathy, RA and post visit ordering of testing.    Paulene Floor  [facility] [phone] (phone) [phone] (fax)  [place]

## 2020-06-19 ENCOUNTER — Ambulatory Visit: Payer: PPO | Admitting: Physical Therapy

## 2020-06-21 ENCOUNTER — Encounter: Payer: Self-pay | Admitting: Physician Assistant

## 2020-06-24 ENCOUNTER — Encounter: Payer: Self-pay | Admitting: Physical Therapy

## 2020-06-24 ENCOUNTER — Other Ambulatory Visit: Payer: Self-pay

## 2020-06-24 ENCOUNTER — Ambulatory Visit: Payer: PPO | Admitting: Physical Therapy

## 2020-06-24 DIAGNOSIS — R262 Difficulty in walking, not elsewhere classified: Secondary | ICD-10-CM

## 2020-06-24 DIAGNOSIS — R2689 Other abnormalities of gait and mobility: Secondary | ICD-10-CM

## 2020-06-24 DIAGNOSIS — G8929 Other chronic pain: Secondary | ICD-10-CM

## 2020-06-24 DIAGNOSIS — M6281 Muscle weakness (generalized): Secondary | ICD-10-CM

## 2020-06-24 DIAGNOSIS — R296 Repeated falls: Secondary | ICD-10-CM

## 2020-06-24 DIAGNOSIS — M25561 Pain in right knee: Secondary | ICD-10-CM

## 2020-06-24 NOTE — Therapy (Signed)
[facility] [address], [place], [address] Phone: [phone]   Fax:  [phone]  Physical Therapy Treatment  Patient Details  Name: Wendy Patton MRN: [id number] Date of Birth: [birth year] No data recorded  Encounter Date: 06/24/2020   PT End of Session - 06/24/20 0948    Visit Number 8    Number of Visits 17    Date for PT Re-Evaluation 07/19/20    Authorization - Visit Number 8    Authorization - Number of Visits 10    PT Start Time 0820    PT Stop Time 2263    PT Time Calculation (min) 38 min    Activity Tolerance Patient tolerated treatment well    Behavior During Therapy [facility] for tasks assessed/performed           Past Medical History:  Diagnosis Date  . Anemia    after jaw surgery was on iron for short period of time  . Arthritis   . Cancer (HCC)    squamous cell  . Difficult intubation    potential for difficult airway due to limited mouth opening  . Dry skin   . GERD (gastroesophageal reflux disease)    takes Aciphex daily  . Headache(784.0)    hx of migraines   . Heart murmur   . History of migraine    last one Aug 2014 and takes Imitrex daily as needed  . History of staph infection   . IBS (irritable bowel syndrome)   . Joint pain   . Joint swelling   . Laryngopharyngeal reflux   . MVP (mitral valve prolapse)   . Neuralgia   . Neuropathy    takes gabapentin daily  . PONV (postoperative nausea and vomiting)    TMJ surgery several times  . Rheumatic fever    as a child  . Shortness of breath    with exertion  . Thyroid disease     Past Surgical History:  Procedure Laterality Date  . ABDOMINAL HYSTERECTOMY  2009  . BREAST DUCTAL SYSTEM EXCISION Right 01/11/2014   Procedure: MAJOR DUCT EXCISION RIGHT BREAST;  Surgeon: Stark Klein, MD;  Location: [facility];  Service: General;  Laterality: Right;  . BREAST EXCISIONAL BIOPSY Right 2015   neg  . BREAST EXCISIONAL BIOPSY  Left 2010   benign papilloma removed  . BREAST EXCISIONAL BIOPSY Left 1975   neg  . BREAST SURGERY  1975, 2010   benign breast tumor  . breat excision  1975   benign breast tumor excision  . CATARACT EXTRACTION W/PHACO Right 07/15/2016   Procedure: CATARACT EXTRACTION PHACO AND INTRAOCULAR LENS PLACEMENT (IOC);  Surgeon: Estill Cotta, MD;  Location: [facility];  Service: Ophthalmology;  Laterality: Right;  Lot # [id number] H [place]: 01:38.6 AP%: 25.0 CDE: 46.89  . CATARACT EXTRACTION W/PHACO Left 09/02/2016   Procedure: CATARACT EXTRACTION PHACO AND INTRAOCULAR LENS PLACEMENT (IOC);  Surgeon: Estill Cotta, MD;  Location: [facility];  Service: Ophthalmology;  Laterality: Left;  [place] 2:05.3 AP% 24.9 CDE 51.84 Fluid pack lot # [id number]  . cervial polyps  1999  . [place]  . COLONOSCOPY    . CYSTECTOMY  1962   vaginal cyst removal  . DILATION AND CURETTAGE OF UTERUS  1984  . ESOPHAGOGASTRODUODENOSCOPY    . granuloma  2002   chin granuloma removed  . [place], 2002, 2004   prosthesis, condyles implants, fossa replaced  .  REVISION TOTAL KNEE ARTHROPLASTY    . SIGMOIDOSCOPY  1993  . SQUAMOUS CELL CARCINOMA EXCISION  2008   left ankle/skin graft  . [place]  . THYROIDECTOMY  2006   left  . TONSILLECTOMY AND ADENOIDECTOMY  1944  . TOTAL HIP ARTHROPLASTY Left 09/12/2013   Procedure: LEFT TOTAL HIP ARTHROPLASTY ANTERIOR APPROACH;  Surgeon: [name], MD;  Location: [place];  Service: Orthopedics;  Laterality: Left;  . TOTAL HIP ARTHROPLASTY Right 04/18/2019   Procedure: RIGHT TOTAL HIP ARTHROPLASTY ANTERIOR APPROACH;  Surgeon: Melrose Nakayama, MD;  [facility];  Service: Orthopedics;  Laterality: Right;  . TOTAL HIP ARTHROPLASTY Right 08/15/2019   Procedure: TOTAL HIP ARTHROPLASTY ANTERIOR APPROACH;  Surgeon: Melrose Nakayama, MD;  [facility];  Service: Orthopedics;  Laterality: Right;  .  TRAPEZIUM RESECTION  2000, 2004   removal right & left hand trapezium  . TUBAL LIGATION    . uterine polyps  2008  . vaginal cyst removed  1962    There were no vitals filed for this visit.   Subjective Assessment - 06/24/20 0821    Subjective Pt reports she is so much more confident and was able to walk across her yard for the first time in 6 months.Pt states getting out of a chair is no longer painful and climbing stairs has gotten a lot easier. Pt states she feels 75% better.    Pertinent History Pt is a 84 year old female familiar with this clinic, d/c for L knee pain May 2021. Reports she has had 3 falls in past 6 months. All falls occur while walking, feels like her toes drag and get caught. She falls into the floor and reports she is unable to get up without something to hold onto. On July 4 she fell in the grass at a party, and had to have people help her up. When she has fallen at home she has been able to get herself to a chair or couch to get up modI ("very slowly"). PMH of bilat pedal neuropathy. She is completing exercises 4-5/week from previous HEP and yoga. She is retired, enjoys yoga, kniting. Reports she has had L hip pain over the past 6 months, when laying on L side 4/10, but does not bother her any other time. Pt denies N/V, B&B changes, unexplained weight fluctuation, saddle paresthesia, fever, night sweats, or unrelenting night pain at this time.    Limitations Lifting;Walking;House hold activities    How long can you sit comfortably? unlimited    How long can you stand comfortably? unlimited    How long can you walk comfortably? 51mins    Diagnostic tests None to date    Patient Stated Goals Stop tripping over "nothing" and increase balance    Currently in Pain? No/denies              Ther-Ex Walking up 7% grade 1.68mph for 27mins; 10% grade 1.52mph 4mins for increased foot clearance with good carry over  Following G3 post talocrual mobilization with movement foot up  on 2nd stair into DF L; RLE just foot on step moving into lunge x15 2-5sec  Heel rockers on bosu hard side DF/PF  in standing x20   Step up on BOSU soft side with feet together 2x12 bilat, pt had mild R knee pain   Duck walks 2x 83ft with CGA for safety, better balance this session   Tandem walking 2x35ft with UEs across chest CGA for safety  SLS on foam 2 x30sec with intermittent 1-66finger support  Deep squat with TRX 2x 10 with increased ankle DF, some knee pain throughout, min cuing to prevent heel lift  Gastroc stretch on slant board x25min                           PT Education - 06/24/20 0952    Education Details therex form/technique    Person(s) Educated Patient    Methods Explanation;Demonstration;Verbal cues    Comprehension Verbalized understanding;Returned demonstration;Verbal cues required            PT Short Term Goals - 05/21/20 1023      PT SHORT TERM GOAL #1   Title Pt will be independent with HEP in order to improve strength and decrease back pain in order to improve pain-free function at home and work.    Baseline 10/19//21 HEP given    Time 4    Period Weeks    Status New             PT Long Term Goals - 05/21/20 1024      PT LONG TERM GOAL #1   Title Patient will increase FOTO score to 58 to demonstrate predicted increase in functional mobility to complete ADLs    Baseline 05/21/20 70    Time 8    Period Weeks    Status New      PT LONG TERM GOAL #2   Title Pt will decrease 5TSTS by at least 3 seconds in order to demonstrate clinically significant improvement in LE strength.    Baseline 05/20/20 15sec    Time 8    Period Weeks    Status New      PT LONG TERM GOAL #3   Title Pt will improve FGA by at least 3 points in order to demonstrate clinically significant improvement in balance and decreased risk for falls.    Baseline 05/20/20 19/30    Time 8    Period Weeks    Status New                 Plan -  06/24/20 0856    Clinical Impression Statement Pt is showing great progress with ankle stability, proprioception, and balance. Pt is able to demonstrate better static and dynamic balance and using good stepping strategies when needed. Pt reports she is less fearful of falling and continues to be able to complete exercises with decreased loss of balance. PT will continue to progress.    Personal Factors and Comorbidities Comorbidity 1;Comorbidity 2;Comorbidity 3+;Age;Fitness;Past/Current Experience;Sex    Comorbidities bilat neuropathy, arthritis, anemia, history of cancer    Examination-Activity Limitations Lift;Squat;Locomotion Level;Stairs;Reach Overhead;Stand    Examination-Participation Restrictions Meal Prep;Community Activity;Cleaning;Laundry;Yard Work    [employer] officer Evolving/Moderate complexity    Clinical Decision Making Moderate    Rehab Potential Good    PT Frequency 2x / week    PT Duration 8 weeks    PT Treatment/Interventions ADLs/Self Care Home Management;Cryotherapy;Ultrasound;Stair training;Balance training;Dry needling;Moist Heat;Traction;Gait training;Therapeutic exercise;Patient/family education;Manual techniques;Passive range of motion;Electrical Stimulation;Functional mobility training;Therapeutic activities;Neuromuscular re-education;Energy conservation;Joint Manipulations;Spinal Manipulations    PT Next Visit Plan high level gait training, great toe extension strengthening    PT Home Exercise Plan SLS, DF, gastroc stretch    Consulted and Agree with Plan of Care Patient           Patient will benefit from skilled therapeutic intervention in order to improve the following deficits  and impairments:  Abnormal gait, Decreased balance, Decreased endurance, Decreased mobility, Difficulty walking, Decreased range of motion, Improper body mechanics, Pain, Postural dysfunction, Impaired flexibility, Increased fascial restricitons, Decreased strength, Decreased  coordination, Decreased activity tolerance  Visit Diagnosis: Other abnormalities of gait and mobility  Difficulty in walking, not elsewhere classified  Chronic pain of right knee  Muscle weakness (generalized)  Repeated falls     Problem List Patient Active Problem List   Diagnosis Date Noted  . History of revision of total replacement of right hip joint 08/15/2019  . Primary localized osteoarthritis of right hip 04/18/2019  . Primary osteoarthritis of right hip 04/18/2019  . Pectus excavatum 10/12/2017  . 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 Perimenopausal atrophic vaginitis 09/22/2016  . Menopausal hot flushes 09/22/2016  . Mitral valve prolapse 01/13/2016  . Herniation of nucleus pulposus 05/01/2015  . Hypercholesteremia 05/01/2015  . Osteoarthritis of both knees 05/01/2015  . History of migraine headaches 05/01/2015  . Esophageal reflux 01/10/2015  . S/P total hip arthroplasty 09/12/2013    Durwin Reges DPT [facility], SPT Durwin Reges 06/24/2020, 10:41 AM  [facility] [address] [place], [address] Phone: [phone]   Fax:  [phone]  Name: KALEAH HAGEMEISTER MRN: [id number] Date of Birth: 03/26/1936

## 2020-06-25 DIAGNOSIS — H6121 Impacted cerumen, right ear: Secondary | ICD-10-CM | POA: Diagnosis not present

## 2020-06-25 DIAGNOSIS — H60332 Swimmer's ear, left ear: Secondary | ICD-10-CM | POA: Diagnosis not present

## 2020-06-25 DIAGNOSIS — L720 Epidermal cyst: Secondary | ICD-10-CM | POA: Diagnosis not present

## 2020-06-25 DIAGNOSIS — H60339 Swimmer's ear, unspecified ear: Secondary | ICD-10-CM | POA: Diagnosis not present

## 2020-06-26 LAB — C-REACTIVE PROTEIN: CRP: <1 mg/L (ref 0–10)

## 2020-06-26 LAB — ANTI-CCP AB, IGG + IGA (RDL): Anti-CCP Ab, IgG + IgA (RDL): <20 Units (ref <20)

## 2020-06-26 LAB — ANA: ANA Titer 1: NEGATIVE

## 2020-06-26 LAB — RHEUMATOID FACTOR: Rheumatoid fact SerPl-aCnc: <10 IU/mL (ref 0.0–13.9)

## 2020-06-26 LAB — SEDIMENTATION RATE: Sed Rate: 12 mm/hr (ref 0–40)

## 2020-06-27 ENCOUNTER — Encounter: Payer: Self-pay | Admitting: Physician Assistant

## 2020-07-02 ENCOUNTER — Encounter: Payer: Self-pay | Admitting: Physical Therapy

## 2020-07-02 ENCOUNTER — Ambulatory Visit: Payer: PPO | Admitting: Physical Therapy

## 2020-07-02 ENCOUNTER — Other Ambulatory Visit: Payer: Self-pay

## 2020-07-02 DIAGNOSIS — R296 Repeated falls: Secondary | ICD-10-CM

## 2020-07-02 DIAGNOSIS — R2689 Other abnormalities of gait and mobility: Secondary | ICD-10-CM | POA: Diagnosis not present

## 2020-07-02 DIAGNOSIS — R262 Difficulty in walking, not elsewhere classified: Secondary | ICD-10-CM

## 2020-07-02 DIAGNOSIS — M6281 Muscle weakness (generalized): Secondary | ICD-10-CM

## 2020-07-02 DIAGNOSIS — G8929 Other chronic pain: Secondary | ICD-10-CM

## 2020-07-02 DIAGNOSIS — M25562 Pain in left knee: Secondary | ICD-10-CM

## 2020-07-02 NOTE — Therapy (Signed)
[place] [facility] [address] [place], [address] Phone: [phone]   Fax:  [phone]  Physical Therapy Treatment  Patient Details  Name: Wendy Patton MRN: [id number] Date of Birth: 06-10-1936 No data recorded  Encounter Date: 07/02/2020   PT End of Session - 07/02/20 1223    Visit Number 9    Number of Visits 17    Date for PT Re-Evaluation 07/19/20    Authorization - Visit Number 9    Authorization - Number of Visits 10    PT Start Time 1030    PT Stop Time 1110    PT Time Calculation (min) 40 min    Equipment Utilized During Treatment Gait belt    Activity Tolerance Patient tolerated treatment well    Behavior During Therapy [facility] for tasks assessed/performed           Past Medical History:  Diagnosis Date  . Anemia    after jaw surgery was on iron for short period of time  . Arthritis   . Cancer (HCC)    squamous cell  . Difficult intubation    potential for difficult airway due to limited mouth opening  . Dry skin   . GERD (gastroesophageal reflux disease)    takes Aciphex daily  . Headache(784.0)    hx of migraines   . Heart murmur   . History of migraine    last one Aug 2014 and takes Imitrex daily as needed  . History of staph infection   . IBS (irritable bowel syndrome)   . Joint pain   . Joint swelling   . Laryngopharyngeal reflux   . MVP (mitral valve prolapse)   . Neuralgia   . Neuropathy    takes gabapentin daily  . PONV (postoperative nausea and vomiting)    TMJ surgery several times  . Rheumatic fever    as a child  . Shortness of breath    with exertion  . Thyroid disease     Past Surgical History:  Procedure Laterality Date  . ABDOMINAL HYSTERECTOMY  2009  . BREAST DUCTAL SYSTEM EXCISION Right 01/11/2014   Procedure: MAJOR DUCT EXCISION RIGHT BREAST;  Surgeon: Stark Klein, MD;  [facility]: [facility];  Service: General;  Laterality: Right;  . BREAST EXCISIONAL  BIOPSY Right 2015   neg  . BREAST EXCISIONAL BIOPSY Left 2010   benign papilloma removed  . BREAST EXCISIONAL BIOPSY Left 1975   neg  . BREAST SURGERY  1975, 2010   benign breast tumor  . breat excision  1975   benign breast tumor excision  . CATARACT EXTRACTION W/PHACO Right 07/15/2016   Procedure: CATARACT EXTRACTION PHACO AND INTRAOCULAR LENS PLACEMENT (IOC);  Surgeon: Estill Cotta, MD;  Location: [facility];  Service: Ophthalmology;  Laterality: Right;  Lot # [id number] H [place]: 01:38.6 AP%: 25.0 CDE: 46.89  . CATARACT EXTRACTION W/PHACO Left 09/02/2016   Procedure: CATARACT EXTRACTION PHACO AND INTRAOCULAR LENS PLACEMENT (IOC);  Surgeon: Estill Cotta, MD;  Location: [facility];  Service: Ophthalmology;  Laterality: Left;  [place] 2:05.3 AP% 24.9 CDE 51.84 Fluid pack lot # [id number]  . cervial polyps  1999  . [place]  . COLONOSCOPY    . CYSTECTOMY  1962   vaginal cyst removal  . DILATION AND CURETTAGE OF UTERUS  1984  . ESOPHAGOGASTRODUODENOSCOPY    . granuloma  2002   chin granuloma removed  . [place],  2002, 2004   prosthesis, condyles implants, fossa replaced  . REVISION TOTAL KNEE ARTHROPLASTY    . SIGMOIDOSCOPY  1993  . SQUAMOUS CELL CARCINOMA EXCISION  2008   left ankle/skin graft  . [place]  . THYROIDECTOMY  2006   left  . TONSILLECTOMY AND ADENOIDECTOMY  1944  . TOTAL HIP ARTHROPLASTY Left 09/12/2013   Procedure: LEFT TOTAL HIP ARTHROPLASTY ANTERIOR APPROACH;  Surgeon: [name], MD;  Location: [place];  Service: Orthopedics;  Laterality: Left;  . TOTAL HIP ARTHROPLASTY Right 04/18/2019   Procedure: RIGHT TOTAL HIP ARTHROPLASTY ANTERIOR APPROACH;  Surgeon: Melrose Nakayama, MD;  [facility];  Service: Orthopedics;  Laterality: Right;  . TOTAL HIP ARTHROPLASTY Right 08/15/2019   Procedure: TOTAL HIP ARTHROPLASTY ANTERIOR APPROACH;  Surgeon: Melrose Nakayama, MD;  [facility];   Service: Orthopedics;  Laterality: Right;  . TRAPEZIUM RESECTION  2000, 2004   removal right & left hand trapezium  . TUBAL LIGATION    . uterine polyps  2008  . vaginal cyst removed  1962    There were no vitals filed for this visit.   Subjective Assessment - 07/02/20 1032    Subjective Pt reports pain in knee following last visit but they feel better now. Pt reports she had an ear infection and her balance is not as good as its been previously.    Pertinent History Pt is a 84 year old female familiar with this clinic, d/c for L knee pain May 2021. Reports she has had 3 falls in past 6 months. All falls occur while walking, feels like her toes drag and get caught. She falls into the floor and reports she is unable to get up without something to hold onto. On July 4 she fell in the grass at a party, and had to have people help her up. When she has fallen at home she has been able to get herself to a chair or couch to get up modI ("very slowly"). PMH of bilat pedal neuropathy. She is completing exercises 4-5/week from previous HEP and yoga. She is retired, enjoys yoga, kniting. Reports she has had L hip pain over the past 6 months, when laying on L side 4/10, but does not bother her any other time. Pt denies N/V, B&B changes, unexplained weight fluctuation, saddle paresthesia, fever, night sweats, or unrelenting night pain at this time.    Limitations Lifting;Walking;House hold activities    How long can you sit comfortably? unlimited    How long can you stand comfortably? unlimited    How long can you walk comfortably? 78mins    Diagnostic tests None to date    Patient Stated Goals Stop tripping over "nothing" and increase balance    Currently in Pain? No/denies           Ther-Ex Following G3 post talocrual mobilization with movement foot up on 2nd stair into DF L; RLE just foot on step moving into lunge x15 2-5sec    Heel rockers on bosu hard side DF/PF in standing x20    Duck walks  2x 1ft with CGA for safety, better balance this session    Tandem walking 2x44ft with UEs across chest CGA for safety   Toe walking 2x60ft CGA for safety    Lateral step over object to SLS and 3sec hold 2x12 harder w/SLS on L leg    SLS on foam 2 x30sec with intermittent 1-56finger support   Gastroc stretch on slant board  x16min                          PT Education - 07/02/20 1223    Education Details therex form/technique    Person(s) Educated Patient    Methods Explanation;Demonstration;Verbal cues    Comprehension Returned demonstration;Verbal cues required;Verbalized understanding            PT Short Term Goals - 05/21/20 1023      PT SHORT TERM GOAL #1   Title Pt will be independent with HEP in order to improve strength and decrease back pain in order to improve pain-free function at home and work.    Baseline 10/19//21 HEP given    Time 4    Period Weeks    Status New             PT Long Term Goals - 05/21/20 1024      PT LONG TERM GOAL #1   Title Patient will increase FOTO score to 58 to demonstrate predicted increase in functional mobility to complete ADLs    Baseline 05/21/20 70    Time 8    Period Weeks    Status New      PT LONG TERM GOAL #2   Title Pt will decrease 5TSTS by at least 3 seconds in order to demonstrate clinically significant improvement in LE strength.    Baseline 05/20/20 15sec    Time 8    Period Weeks    Status New      PT LONG TERM GOAL #3   Title Pt will improve FGA by at least 3 points in order to demonstrate clinically significant improvement in balance and decreased risk for falls.    Baseline 05/20/20 19/30    Time 8    Period Weeks    Status New                 Plan - 07/02/20 1224    Clinical Impression Statement Pt continues to have mild balance deficits and able to complete all therex with min CGA for safety. Pt is able to demonstrate better balance throughout session and able to use  proper ankle stepping strategies when appropriate. Pt has increased balance and strength deficits on LLE compared to RLE and states she has been working on improving her LLE. Pt is pleased with her progress and will be ready to be discharged upon next visit.    Personal Factors and Comorbidities Comorbidity 1;Comorbidity 2;Comorbidity 3+;Age;Fitness;Past/Current Experience;Sex    Comorbidities bilat neuropathy, arthritis, anemia, history of cancer    Examination-Activity Limitations Lift;Squat;Locomotion Level;Stairs;Reach Overhead;Stand    Examination-Participation Restrictions Meal Prep;Community Activity;Cleaning;Laundry;Yard Work    [employer] officer Evolving/Moderate complexity    Clinical Decision Making Moderate    Rehab Potential Good    PT Frequency 2x / week    PT Duration 8 weeks    PT Treatment/Interventions ADLs/Self Care Home Management;Cryotherapy;Ultrasound;Stair training;Balance training;Dry needling;Moist Heat;Traction;Gait training;Therapeutic exercise;Patient/family education;Manual techniques;Passive range of motion;Electrical Stimulation;Functional mobility training;Therapeutic activities;Neuromuscular re-education;Energy conservation;Joint Manipulations;Spinal Manipulations    PT Next Visit Plan high level gait training, great toe extension strengthening    PT Home Exercise Plan SLS, DF, gastroc stretch    Consulted and Agree with Plan of Care Patient           Patient will benefit from skilled therapeutic intervention in order to improve the following deficits and impairments:  Abnormal gait, Decreased balance, Decreased endurance, Decreased mobility, Difficulty walking, Decreased range of  motion, Improper body mechanics, Pain, Postural dysfunction, Impaired flexibility, Increased fascial restricitons, Decreased strength, Decreased coordination, Decreased activity tolerance  Visit Diagnosis: Other abnormalities of gait and mobility  Difficulty in  walking, not elsewhere classified  Muscle weakness (generalized)  Chronic pain of left knee  Chronic pain of right knee  Repeated falls     Problem List Patient Active Problem List   Diagnosis Date Noted  . History of revision of total replacement of right hip joint 08/15/2019  . Primary localized osteoarthritis of right hip 04/18/2019  . Primary osteoarthritis of right hip 04/18/2019  . Pectus excavatum 10/12/2017  . 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 Perimenopausal atrophic vaginitis 09/22/2016  . Menopausal hot flushes 09/22/2016  . Mitral valve prolapse 01/13/2016  . Herniation of nucleus pulposus 05/01/2015  . Hypercholesteremia 05/01/2015  . Osteoarthritis of both knees 05/01/2015  . History of migraine headaches 05/01/2015  . Esophageal reflux 01/10/2015  . S/P total hip arthroplasty 09/12/2013     Durwin Reges DPT Andris Baumann, [place] 07/02/2020, 12:27 PM  [place] [facility] [address], [place], [address] Phone: [phone]   Fax:  [phone]  Name: Wendy Patton MRN: [id number] Date of Birth: [birth year]

## 2020-07-04 ENCOUNTER — Encounter: Payer: Self-pay | Admitting: Physical Therapy

## 2020-07-04 ENCOUNTER — Ambulatory Visit: Payer: PPO | Attending: Physician Assistant | Admitting: Physical Therapy

## 2020-07-04 ENCOUNTER — Other Ambulatory Visit: Payer: Self-pay

## 2020-07-04 DIAGNOSIS — R296 Repeated falls: Secondary | ICD-10-CM | POA: Diagnosis not present

## 2020-07-04 DIAGNOSIS — M25562 Pain in left knee: Secondary | ICD-10-CM | POA: Diagnosis not present

## 2020-07-04 DIAGNOSIS — M6281 Muscle weakness (generalized): Secondary | ICD-10-CM | POA: Insufficient documentation

## 2020-07-04 DIAGNOSIS — G8929 Other chronic pain: Secondary | ICD-10-CM | POA: Insufficient documentation

## 2020-07-04 DIAGNOSIS — R2689 Other abnormalities of gait and mobility: Secondary | ICD-10-CM | POA: Diagnosis not present

## 2020-07-04 DIAGNOSIS — R262 Difficulty in walking, not elsewhere classified: Secondary | ICD-10-CM | POA: Insufficient documentation

## 2020-07-04 DIAGNOSIS — M25561 Pain in right knee: Secondary | ICD-10-CM | POA: Diagnosis not present

## 2020-07-04 NOTE — Therapy (Signed)
[facility] [address], [place], [address] Phone: [phone]   Fax:  [phone]  Physical Therapy Treatment  Patient Details  Name: Wendy Patton MRN: [id number] Date of Birth: 07-Jul-1936 No data recorded  Encounter Date: 07/04/2020   PT End of Session - 07/04/20 1600    Visit Number 10    Number of Visits 17    Date for PT Re-Evaluation 07/19/20    Authorization - Visit Number 10    Authorization - Number of Visits 10    PT Start Time 0230    PT Stop Time 0308    PT Time Calculation (min) 38 min    Equipment Utilized During Treatment Gait belt    Activity Tolerance Patient tolerated treatment well    Behavior During Therapy [facility] for tasks assessed/performed           Past Medical History:  Diagnosis Date  . Anemia    after jaw surgery was on iron for short period of time  . Arthritis   . Cancer (HCC)    squamous cell  . Difficult intubation    potential for difficult airway due to limited mouth opening  . Dry skin   . GERD (gastroesophageal reflux disease)    takes Aciphex daily  . Headache(784.0)    hx of migraines   . Heart murmur   . History of migraine    last one Aug 2014 and takes Imitrex daily as needed  . History of staph infection   . IBS (irritable bowel syndrome)   . Joint pain   . Joint swelling   . Laryngopharyngeal reflux   . MVP (mitral valve prolapse)   . Neuralgia   . Neuropathy    takes gabapentin daily  . PONV (postoperative nausea and vomiting)    TMJ surgery several times  . Rheumatic fever    as a child  . Shortness of breath    with exertion  . Thyroid disease     Past Surgical History:  Procedure Laterality Date  . ABDOMINAL HYSTERECTOMY  2009  . BREAST DUCTAL SYSTEM EXCISION Right 01/11/2014   Procedure: MAJOR DUCT EXCISION RIGHT BREAST;  Surgeon: Stark Klein, MD;  Location: [facility];  Service: General;  Laterality: Right;  . BREAST EXCISIONAL  BIOPSY Right 2015   neg  . BREAST EXCISIONAL BIOPSY Left 2010   benign papilloma removed  . BREAST EXCISIONAL BIOPSY Left 1975   neg  . BREAST SURGERY  1975, 2010   benign breast tumor  . breat excision  1975   benign breast tumor excision  . CATARACT EXTRACTION W/PHACO Right 07/15/2016   Procedure: CATARACT EXTRACTION PHACO AND INTRAOCULAR LENS PLACEMENT (IOC);  Surgeon: Estill Cotta, MD;  Location: [facility];  Service: Ophthalmology;  Laterality: Right;  Lot # [id number] H [place]: 01:38.6 AP%: 25.0 CDE: 46.89  . CATARACT EXTRACTION W/PHACO Left 09/02/2016   Procedure: CATARACT EXTRACTION PHACO AND INTRAOCULAR LENS PLACEMENT (IOC);  Surgeon: Estill Cotta, MD;  Location: [facility];  Service: Ophthalmology;  Laterality: Left;  [place] 2:05.3 AP% 24.9 CDE 51.84 Fluid pack lot # [id number]  . cervial polyps  1999  . [place]  . COLONOSCOPY    . CYSTECTOMY  1962   vaginal cyst removal  . DILATION AND CURETTAGE OF UTERUS  1984  . ESOPHAGOGASTRODUODENOSCOPY    . granuloma  2002   chin granuloma removed  . [place],  2002, 2004   prosthesis, condyles implants, fossa replaced  . REVISION TOTAL KNEE ARTHROPLASTY    . SIGMOIDOSCOPY  1993  . SQUAMOUS CELL CARCINOMA EXCISION  2008   left ankle/skin graft  . [place]  . THYROIDECTOMY  2006   left  . TONSILLECTOMY AND ADENOIDECTOMY  1944  . TOTAL HIP ARTHROPLASTY Left 09/12/2013   Procedure: LEFT TOTAL HIP ARTHROPLASTY ANTERIOR APPROACH;  Surgeon: [name], MD;  Location: [facility];  Service: Orthopedics;  Laterality: Left;  . TOTAL HIP ARTHROPLASTY Right 04/18/2019   Procedure: RIGHT TOTAL HIP ARTHROPLASTY ANTERIOR APPROACH;  Surgeon: Melrose Nakayama, MD;  [facility];  Service: Orthopedics;  Laterality: Right;  . TOTAL HIP ARTHROPLASTY Right 08/15/2019   Procedure: TOTAL HIP ARTHROPLASTY ANTERIOR APPROACH;  Surgeon: Melrose Nakayama, MD;  [facility];   Service: Orthopedics;  Laterality: Right;  . TRAPEZIUM RESECTION  2000, 2004   removal right & left hand trapezium  . TUBAL LIGATION    . uterine polyps  2008  . vaginal cyst removed  1962    There were no vitals filed for this visit.   Subjective Assessment - 07/04/20 1435    Subjective Patient reports she had a recent fall 2 days ago while getting up from a nap and tripping over an object. Pt reports she still has an ear infection has been effecting her balance.    Pertinent History Pt is a 84 year old female familiar with this clinic, d/c for L knee pain May 2021. Reports she has had 3 falls in past 6 months. All falls occur while walking, feels like her toes drag and get caught. She falls into the floor and reports she is unable to get up without something to hold onto. On July 4 she fell in the grass at a party, and had to have people help her up. When she has fallen at home she has been able to get herself to a chair or couch to get up modI ("very slowly"). PMH of bilat pedal neuropathy. She is completing exercises 4-5/week from previous HEP and yoga. She is retired, enjoys yoga, kniting. Reports she has had L hip pain over the past 6 months, when laying on L side 4/10, but does not bother her any other time. Pt denies N/V, B&B changes, unexplained weight fluctuation, saddle paresthesia, fever, night sweats, or unrelenting night pain at this time.    Limitations Lifting;Walking;House hold activities    How long can you sit comfortably? unlimited    How long can you stand comfortably? unlimited    How long can you walk comfortably? 61mns    Diagnostic tests None to date    Patient Stated Goals Stop tripping over "nothing" and increase balance    Currently in Pain? No/denies          Therex 5TSTS- unable to do without UE support 13sec   FGA 23  Tandem walking 2x271fDuck walks 2x2065fSLS on foam bilat 2x30sec  Lateral step overs with SLS balance 2 sec holds 2x12   PT  reviewed the following HEP with patient with patient able to demonstrate a set of the following with min cuing for correction needed. PT educated patient on parameters of therex (how/when to inc/decrease intensity, frequency, rep/set range, stretch hold time, and purpose of therex) with verbalized understanding.                       PT Education -  07/04/20 1600    Education Details therex form/technique    Person(s) Educated Patient    Methods Explanation;Demonstration;Verbal cues    Comprehension Verbalized understanding;Returned demonstration;Verbal cues required            PT Short Term Goals - 07/04/20 1608      PT SHORT TERM GOAL #1   Title Pt will be independent with HEP in order to improve strength and decrease back pain in order to improve pain-free function at home and work.    Baseline 10/19//21 HEP given 07/04/20 updated HEP    Time 4    Period Weeks    Status Achieved    Target Date 11/02/18             PT Long Term Goals - 07/04/20 1609      PT LONG TERM GOAL #1   Title Patient will increase FOTO score to 58 to demonstrate predicted increase in functional mobility to complete ADLs    Baseline 05/21/20 70 07/04/20 79    Time 8    Period Weeks    Status Achieved      PT LONG TERM GOAL #2   Title Pt will decrease 5TSTS by at least 3 seconds in order to demonstrate clinically significant improvement in LE strength.    Baseline 05/20/20 15sec 07/04/20 13sec w/UE support    Time 8    Period Weeks    Status Not Met      PT LONG TERM GOAL #3   Title Pt will improve FGA by at least 3 points in order to demonstrate clinically significant improvement in balance and decreased risk for falls.    Baseline 05/20/20 19/30 07/04/20 23    Time 8    Period Weeks    Status Achieved                 Plan - 07/04/20 1601    Clinical Impression Statement PT session focused on HEP updates to continue working on balance and gait deficits. Pt was able to  show good improvements with FGA and single leg balance and was able to demonstrate advanced balance exercises with min CGA. Pt continues to have minimal difficulty with transfers due to chronic knee pain but is able to show continued progression with balance. Pt was educated of frequency and duration of balance exercises in conjuction with the knee and hip exercises she has been doing post THA earlier this year. D/C from PT.    Personal Factors and Comorbidities Comorbidity 1;Comorbidity 2;Comorbidity 3+;Age;Fitness;Past/Current Experience;Sex    Comorbidities bilat neuropathy, arthritis, anemia, history of cancer    Examination-Activity Limitations Lift;Squat;Locomotion Level;Stairs;Reach Overhead;Stand    Examination-Participation Restrictions Meal Prep;Community Activity;Cleaning;Laundry;Yard Work    [employer] officer Evolving/Moderate complexity    Clinical Decision Making Moderate    Rehab Potential Good    PT Frequency 2x / week    PT Duration 8 weeks    PT Treatment/Interventions ADLs/Self Care Home Management;Cryotherapy;Ultrasound;Stair training;Balance training;Dry needling;Moist Heat;Traction;Gait training;Therapeutic exercise;Patient/family education;Manual techniques;Passive range of motion;Electrical Stimulation;Functional mobility training;Therapeutic activities;Neuromuscular re-education;Energy conservation;Joint Manipulations;Spinal Manipulations    PT Next Visit Plan high level gait training, great toe extension strengthening    PT Home Exercise Plan SLS, DF, gastroc stretch    Consulted and Agree with Plan of Care Patient           Patient will benefit from skilled therapeutic intervention in order to improve the following deficits and impairments:  Abnormal gait, Decreased balance, Decreased endurance, Decreased mobility, Difficulty  walking, Decreased range of motion, Improper body mechanics, Pain, Postural dysfunction, Impaired flexibility, Increased fascial  restricitons, Decreased strength, Decreased coordination, Decreased activity tolerance  Visit Diagnosis: Other abnormalities of gait and mobility  Difficulty in walking, not elsewhere classified  Repeated falls  Muscle weakness (generalized)  Chronic pain of left knee  Chronic pain of right knee     Problem List Patient Active Problem List   Diagnosis Date Noted  . History of revision of total replacement of right hip joint 08/15/2019  . Primary localized osteoarthritis of right hip 04/18/2019  . Primary osteoarthritis of right hip 04/18/2019  . Pectus excavatum 10/12/2017  . 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 Perimenopausal atrophic vaginitis 09/22/2016  . Menopausal hot flushes 09/22/2016  . Mitral valve prolapse 01/13/2016  . Herniation of nucleus pulposus 05/01/2015  . Hypercholesteremia 05/01/2015  . Osteoarthritis of both knees 05/01/2015  . History of migraine headaches 05/01/2015  . Esophageal reflux 01/10/2015  . S/P total hip arthroplasty 09/12/2013    Durwin Reges DPT [facility], SPT Durwin Reges 07/04/2020, 5:19 PM  [facility] [address] [place], [address] Phone: [phone]   Fax:  [phone]  Name: Wendy Patton MRN: [id number] Date of Birth: 1935/10/10

## 2020-07-08 ENCOUNTER — Encounter: Payer: Self-pay | Admitting: Physician Assistant

## 2020-07-08 DIAGNOSIS — M2550 Pain in unspecified joint: Secondary | ICD-10-CM

## 2020-07-09 DIAGNOSIS — H903 Sensorineural hearing loss, bilateral: Secondary | ICD-10-CM | POA: Diagnosis not present

## 2020-07-09 DIAGNOSIS — L723 Sebaceous cyst: Secondary | ICD-10-CM | POA: Diagnosis not present

## 2020-08-26 ENCOUNTER — Other Ambulatory Visit: Payer: Self-pay | Admitting: Physician Assistant

## 2020-08-26 DIAGNOSIS — I10 Essential (primary) hypertension: Secondary | ICD-10-CM

## 2020-09-10 NOTE — Progress Notes (Signed)
Office Visit Note  Patient: Wendy Patton             Date of Birth: 03-Oct-1935           MRN: [id number]             PCP: Trinna Post, PA-C Referring: Trinna Post, PA-C Visit Date: 09/11/2020 Occupation: Retired Scientific laboratory technician  Subjective:  New Patient (Initial Visit) (Patient complains of bilateral hand, left shoulder, and bilateral knee pain and stiffness. Patient also notices bilateral hand swelling. Patient has been visiting PT for the last 18 months working with bilateral knees, bilateral hips, and improving balance. Patient has noticed some improvement since being in PT. )   History of Present Illness: Wendy Patton is a 85 y.o. female with a history of migraines, MVP, GERD, HLD here for evaluation of chronic joint pain in multiple sites. She also has a history of bilateral hip replacements and has been working with PT for this for about 18 months with a fair amount of improvement. Currently her hands are painful and has some swelling in fingers on the right. Also her left shoulder pain has been bothersome without much benefit so far. She cannot tolerated oral or topical NSAIDs so not on any specific treatment for current joint problems.  Labs reviewed 06/2020 ANA neg RF neg CCP neg ESR wnl CRP wnl    Activities of Daily Living:  Patient reports morning stiffness for 30 minutes.   Patient Reports nocturnal pain.  Difficulty dressing/grooming: Denies Difficulty climbing stairs: Reports Difficulty getting out of chair: Reports Difficulty using hands for taps, buttons, cutlery, and/or writing: Reports  Review of Systems  Constitutional: Negative for fatigue.  HENT: Negative for mouth sores, mouth dryness and nose dryness.   Eyes: Positive for pain, itching and dryness. Negative for visual disturbance.  Respiratory: Negative for cough, hemoptysis, shortness of breath and difficulty breathing.   Cardiovascular: Negative for chest pain,  palpitations and swelling in legs/feet.  Gastrointestinal: Negative for abdominal pain, blood in stool, constipation and diarrhea.  Endocrine: Negative for increased urination.  Genitourinary: Negative for painful urination.  Musculoskeletal: Positive for arthralgias, joint pain, joint swelling and morning stiffness. Negative for myalgias, muscle weakness, muscle tenderness and myalgias.  Skin: Positive for redness. Negative for color change and rash.  Allergic/Immunologic: Negative for susceptible to infections.  Neurological: Positive for numbness. Negative for dizziness, headaches, memory loss and weakness.  Hematological: Negative for swollen glands.  Psychiatric/Behavioral: Negative for confusion and sleep disturbance.    PMFS History:  Patient Active Problem List   Diagnosis Date Noted  . Erosive osteoarthritis of both hands 09/11/2020  . Primary osteoarthritis, left shoulder 09/11/2020  . History of revision of total replacement of right hip joint 08/15/2019  . Primary localized osteoarthritis of right hip 04/18/2019  . Primary osteoarthritis of right hip 04/18/2019  . Pectus excavatum 10/12/2017  . 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 Perimenopausal atrophic vaginitis 09/22/2016  . Menopausal hot flushes 09/22/2016  . Mitral valve prolapse 01/13/2016  . Herniation of nucleus pulposus 05/01/2015  . Hypercholesteremia 05/01/2015  . Osteoarthritis of both knees 05/01/2015  . History of migraine headaches 05/01/2015  . Esophageal reflux 01/10/2015  . S/P total hip arthroplasty 09/12/2013    Past Medical History:  Diagnosis Date  . Anemia    after jaw surgery was on iron for short period of time  . Arthritis   . Cancer (HCC)    squamous cell  . Difficult  intubation    potential for difficult airway due to limited mouth opening  . Dry skin   . GERD (gastroesophageal reflux disease)    takes Aciphex daily  . Headache(784.0)    hx of migraines   . Heart murmur   .  History of migraine    last one Aug 2014 and takes Imitrex daily as needed  . History of staph infection   . IBS (irritable bowel syndrome)   . Joint pain   . Joint swelling   . Laryngopharyngeal reflux   . MVP (mitral valve prolapse)   . Neuralgia   . Neuropathy    takes gabapentin daily  . PONV (postoperative nausea and vomiting)    TMJ surgery several times  . Rheumatic fever    as a child  . Shortness of breath    with exertion  . Thyroid disease     Family History  Problem Relation Age of Onset  . Mental illness Mother        Committed suicide  . Arthritis Mother   . Heart attack Maternal Grandmother   . Breast cancer Neg Hx    Past Surgical History:  Procedure Laterality Date  . ABDOMINAL HYSTERECTOMY  2009  . BREAST DUCTAL SYSTEM EXCISION Right 01/11/2014   Procedure: MAJOR DUCT EXCISION RIGHT BREAST;  Surgeon: Stark Klein, MD;  [facility];  Service: General;  Laterality: Right;  . BREAST EXCISIONAL BIOPSY Right 2015   neg  . BREAST EXCISIONAL BIOPSY Left 2010   benign papilloma removed  . BREAST EXCISIONAL BIOPSY Left 1975   neg  . BREAST SURGERY  1975, 2010   benign breast tumor  . breat excision  1975   benign breast tumor excision  . CATARACT EXTRACTION W/PHACO Right 07/15/2016   Procedure: CATARACT EXTRACTION PHACO AND INTRAOCULAR LENS PLACEMENT (IOC);  Surgeon: Estill Cotta, MD;  Location: [facility];  Service: Ophthalmology;  Laterality: Right;  Lot # [id number] H [place]: 01:38.6 AP%: 25.0 CDE: 46.89  . CATARACT EXTRACTION W/PHACO Left 09/02/2016   Procedure: CATARACT EXTRACTION PHACO AND INTRAOCULAR LENS PLACEMENT (IOC);  Surgeon: Estill Cotta, MD;  Location: [facility];  Service: Ophthalmology;  Laterality: Left;  [place] 2:05.3 AP% 24.9 CDE 51.84 Fluid pack lot # [id number]  . cervial polyps  1999  . [place]  . COLONOSCOPY    . CYSTECTOMY  1962   vaginal cyst removal  . DILATION AND CURETTAGE OF UTERUS  1984  .  ESOPHAGOGASTRODUODENOSCOPY    . granuloma  2002   chin granuloma removed  . [place], 2002, 2004   prosthesis, condyles implants, fossa replaced  . REVISION TOTAL KNEE ARTHROPLASTY    . SIGMOIDOSCOPY  1993  . SQUAMOUS CELL CARCINOMA EXCISION  2008   left ankle/skin graft  . [place]  . THYROIDECTOMY  2006   left  . TONSILLECTOMY AND ADENOIDECTOMY  1944  . TOTAL HIP ARTHROPLASTY Left 09/12/2013   Procedure: LEFT TOTAL HIP ARTHROPLASTY ANTERIOR APPROACH;  Surgeon: [name], MD;  Location: [facility];  Service: Orthopedics;  Laterality: Left;  . TOTAL HIP ARTHROPLASTY Right 04/18/2019   Procedure: RIGHT TOTAL HIP ARTHROPLASTY ANTERIOR APPROACH;  Surgeon: Melrose Nakayama, MD;  [facility];  Service: Orthopedics;  Laterality: Right;  . TOTAL HIP ARTHROPLASTY Right 08/15/2019   Procedure: TOTAL HIP ARTHROPLASTY ANTERIOR APPROACH;  Surgeon: Melrose Nakayama, MD;  [facility];  Service: Orthopedics;  Laterality: Right;  .  TRAPEZIUM RESECTION  2000, 2004   removal right & left hand trapezium  . TUBAL LIGATION    . uterine polyps  2008  . vaginal cyst removed  1962   Social History   Social History Narrative  . Not on file   Immunization History  Administered Date(s) Administered  . Fluad Quad(high Dose 65+) 04/05/2019  . Influenza Split 05/09/2009, 05/19/2011, 05/09/2012  . Influenza, High Dose Seasonal PF 04/20/2014, 04/26/2020  . Influenza, Seasonal, Injecte, Preservative Fre 05/26/2017  . Influenza,inj,quad, With Preservative 06/01/2018  . Influenza-Unspecified 04/03/2016  . PFIZER(Purple Top)SARS-COV-2 Vaccination 08/28/2019, 09/18/2019, 05/17/2020  . Pneumococcal Conjugate-13 08/20/2014  . Pneumococcal Polysaccharide-23 02/21/1996, 05/09/2009  . Td 01/13/2016  . Tdap 07/02/2005  . Zoster 10/28/2006  . Zoster Recombinat (Shingrix) 04/08/2018, 06/15/2018, 06/28/2018, 07/05/2018      Objective: Vital Signs: BP (!) 169/83 (BP Location: Right Arm, Patient Position: Sitting, Cuff Size: Normal)   Pulse 74   Ht '5\' 6"'  (1.676 m)   Wt 135 lb 12.8 oz (61.6 kg)   BMI 21.92 kg/m    Physical Exam HENT:     Right Ear: External ear normal.     Left Ear: External ear normal.     Mouth/Throat:     Mouth: Mucous membranes are moist.     Pharynx: Oropharynx is clear.  Eyes:     Conjunctiva/sclera: Conjunctivae normal.     Comments: Limited range of jaw opening  Cardiovascular:     Rate and Rhythm: Normal rate and regular rhythm.  Pulmonary:     Effort: Pulmonary effort is normal.     Breath sounds: Normal breath sounds.  Skin:    General: Skin is warm and dry.     Capillary Refill: Capillary refill takes less than 2 seconds.     Comments: Numerous seborrheic keratosis throughout scalp, neck, trunk Raised, skin colored, well circumscribed nodules on both hands  Dry skin, scaling, flaking on distal legs and feet b/l, few scattered petechiae no erythema and no pitting edema  Neurological:     General: No focal deficit present.     Mental Status: She is alert.     Deep Tendon Reflexes: Reflexes normal.  Psychiatric:        Mood and Affect: Mood normal.     Musculoskeletal Exam:  Neck full ROM no tenderness Shoulders left reduced abduction and external rotation RO with tenderness, right full ROM  Elbows full ROM no tenderness or swelling Wrists full ROM no tenderness or swelling Fingers bilateral 1st CMC joint postsurgical changes, extensive bony nodules and joint deviations in 2-5th PIPs and DIPs b/l, active swelling in right 5th PIP Knees full ROM patellofemoral crepitus present no swelling Ankles full ROM no tenderness   Investigation: No additional findings.  Imaging: XR Hand 2 View Left  Result Date: 09/11/2020 X-ray left hand 2 views Radiocarpal joint space appears normal.  Some probable calcifications in TFC.  Extensive cystic changes in carpal bones and  absent trapezium.  MCP joints appear normal.  Advanced degenerative arthritis with several central joint erosions and partial joint fusion in PIP joints and DIP joints except second digit.  Generalized osteopenia.  No soft tissue swelling seen. Impression Erosive osteoarthritis, postsurgical for CMC changes, generalized osteopenia  XR Hand 2 View Right  Result Date: 09/11/2020 X-ray right hand 2 views Radiocarpal joint space intact.  Extensive cystic degenerative changes in carpal bones and status post surgical removal of trapezium.  MCP joints appear normal.  Severe osteoarthritis with multiple central  joint erosions and partial fusion of fifth PIP.  Generalized osteopenia.  No soft tissue swelling seen. Impression Erosive osteoarthritis, postsurgical changes of 1st CMC, and generalized osteopenia   Recent Labs: Lab Results  Component Value Date   WBC 5.0 12/13/2019   HGB 13.6 12/13/2019   PLT 278 12/13/2019   NA 133 (L) 05/10/2020   K 4.4 05/10/2020   CL 94 (L) 05/10/2020   CO2 24 05/10/2020   GLUCOSE 121 (H) 05/10/2020   BUN 16 05/10/2020   CREATININE 0.90 05/10/2020   BILITOT 0.3 05/10/2020   ALKPHOS 61 05/10/2020   AST 18 05/10/2020   ALT 8 05/10/2020   PROT 7.0 05/10/2020   ALBUMIN 4.4 05/10/2020   CALCIUM 10.1 05/10/2020   GFRAA 69 05/10/2020    Speciality Comments: No specialty comments available.  Procedures:  No procedures performed Allergies: Avelox [moxifloxacin], Neurontin [gabapentin], Nitrofurantoin monohyd macro, Percocet [oxycodone-acetaminophen], Pulmicort turbuhaler [budesonide], Amoxicillin, Bactrim [sulfamethoxazole-trimethoprim], Cefdinir, Chlorhexidine, Ciprofloxacin hcl, Clindamycin/lincomycin, Doxycycline, Entex, Hydrocodone, Other, Tavist-d [albertsons dayhist-d], Tizanidine, and Witch hazel   Assessment / Plan:     Visit Diagnoses: Primary osteoarthritis of both hands - Plan: XR Hand 2 View Right, XR Hand 2 View Left, Ambulatory referral to Occupational  Therapy  Hand changes on exam and xrays reviewed in clinic show erosive osteoarthritis. Discussed this condition unfortunately no effective DMARD treatment is available at this time. Symptom management discussed and will refer to OT for evaluate and treat and whether any gloves or device recommendations. Discussed option for intraarticular steroid injection for joints that become actively inflamed, not interested at this time.  Primary osteoarthritis, left shoulder - Plan: Ambulatory referral to Physical Therapy  Left shoulder pain and review of xray there is definite glenohumeral joint OA. Referral to PT for this and as she has no interest in another surgery would also f/u PRN if interested in steroid injection for symptom worsening.  Orders: Orders Placed This Encounter  Procedures  . XR Hand 2 View Right  . XR Hand 2 View Left  . Ambulatory referral to Physical Therapy  . Ambulatory referral to Occupational Therapy   No orders of the defined types were placed in this encounter.   Follow-Up Instructions: Return if symptoms worsen or fail to improve.   Collier Salina, MD  Note - This record has been created using [employer].  Chart creation errors have been sought, but may not always  have been located. Such creation errors do not reflect on  the standard of medical care.

## 2020-09-11 ENCOUNTER — Other Ambulatory Visit: Payer: Self-pay

## 2020-09-11 ENCOUNTER — Ambulatory Visit (INDEPENDENT_AMBULATORY_CARE_PROVIDER_SITE_OTHER): Payer: PPO | Admitting: Internal Medicine

## 2020-09-11 ENCOUNTER — Ambulatory Visit: Payer: Self-pay

## 2020-09-11 ENCOUNTER — Encounter: Payer: Self-pay | Admitting: Internal Medicine

## 2020-09-11 VITALS — BP 169/83 | HR 74 | Ht 66.0 in | Wt 135.8 lb

## 2020-09-11 DIAGNOSIS — M19042 Primary osteoarthritis, left hand: Secondary | ICD-10-CM | POA: Diagnosis not present

## 2020-09-11 DIAGNOSIS — M19012 Primary osteoarthritis, left shoulder: Secondary | ICD-10-CM | POA: Insufficient documentation

## 2020-09-11 DIAGNOSIS — M154 Erosive (osteo)arthritis: Secondary | ICD-10-CM | POA: Insufficient documentation

## 2020-09-11 DIAGNOSIS — M19041 Primary osteoarthritis, right hand: Secondary | ICD-10-CM | POA: Diagnosis not present

## 2020-09-11 NOTE — Patient Instructions (Signed)
Your current hand swelling and changes are consistent with erosive osteoarthritis.  I am placing a referral to therapy for this problem. There is no specific medication for this disease, it typically involves supportive or symptomatic management. Local joint injections can help if specific sites are very painful and swollen.

## 2020-09-16 ENCOUNTER — Encounter: Payer: Self-pay | Admitting: Physician Assistant

## 2020-09-19 ENCOUNTER — Telehealth: Payer: Self-pay

## 2020-09-19 NOTE — Telephone Encounter (Signed)
Patient is requesting her physical therapy referral be faxed to [phone].  Patient states they received the referral for occupational therapy, but not physical therapy.

## 2020-09-19 NOTE — Telephone Encounter (Signed)
Referral has been faxed.

## 2020-09-30 ENCOUNTER — Ambulatory Visit: Payer: PPO | Attending: Physician Assistant | Admitting: Occupational Therapy

## 2020-09-30 ENCOUNTER — Other Ambulatory Visit: Payer: Self-pay

## 2020-09-30 ENCOUNTER — Encounter: Payer: Self-pay | Admitting: Occupational Therapy

## 2020-09-30 DIAGNOSIS — M79642 Pain in left hand: Secondary | ICD-10-CM | POA: Insufficient documentation

## 2020-09-30 DIAGNOSIS — M25642 Stiffness of left hand, not elsewhere classified: Secondary | ICD-10-CM | POA: Insufficient documentation

## 2020-09-30 DIAGNOSIS — M79641 Pain in right hand: Secondary | ICD-10-CM | POA: Diagnosis not present

## 2020-09-30 DIAGNOSIS — M25641 Stiffness of right hand, not elsewhere classified: Secondary | ICD-10-CM | POA: Diagnosis not present

## 2020-09-30 NOTE — Patient Instructions (Signed)
Moist heat am and pm Tendon glides -AROM after heat - not force - gentle 10 reps Opposition - oval - 8 reps pain free  Joint protection and AE trng done

## 2020-09-30 NOTE — Therapy (Signed)
[facility] [address] [place], [address] Phone: [phone]   Fax:  [phone]  Occupational Therapy Evaluation  Patient Details  Name: Wendy Patton MRN: [id number] Date of Birth: 1935/09/20 Referring Provider (OT): DR Vernelle Emerald   Encounter Date: 09/30/2020   OT End of Session - 09/30/20 1157    Visit Number 1    Number of Visits 3    Date for OT Re-Evaluation 10/28/20    OT Start Time 1034    OT Stop Time 1132    OT Time Calculation (min) 58 min    Activity Tolerance Patient tolerated treatment well    Behavior During Therapy [facility] for tasks assessed/performed           Past Medical History:  Diagnosis Date  . Anemia    after jaw surgery was on iron for short period of time  . Arthritis   . Cancer (HCC)    squamous cell  . Difficult intubation    potential for difficult airway due to limited mouth opening  . Dry skin   . GERD (gastroesophageal reflux disease)    takes Aciphex daily  . Headache(784.0)    hx of migraines   . Heart murmur   . History of migraine    last one Aug 2014 and takes Imitrex daily as needed  . History of staph infection   . IBS (irritable bowel syndrome)   . Joint pain   . Joint swelling   . Laryngopharyngeal reflux   . MVP (mitral valve prolapse)   . Neuralgia   . Neuropathy    takes gabapentin daily  . PONV (postoperative nausea and vomiting)    TMJ surgery several times  . Rheumatic fever    as a child  . Shortness of breath    with exertion  . Thyroid disease     Past Surgical History:  Procedure Laterality Date  . ABDOMINAL HYSTERECTOMY  2009  . BREAST DUCTAL SYSTEM EXCISION Right 01/11/2014   Procedure: MAJOR DUCT EXCISION RIGHT BREAST;  Surgeon: Stark Klein, MD;  Location: [facility];  Service: General;  Laterality: Right;  . BREAST EXCISIONAL BIOPSY Right 2015   neg  . BREAST EXCISIONAL BIOPSY Left 2010   benign papilloma removed  .  BREAST EXCISIONAL BIOPSY Left 1975   neg  . BREAST SURGERY  1975, 2010   benign breast tumor  . breat excision  1975   benign breast tumor excision  . CATARACT EXTRACTION W/PHACO Right 07/15/2016   Procedure: CATARACT EXTRACTION PHACO AND INTRAOCULAR LENS PLACEMENT (IOC);  Surgeon: Estill Cotta, MD;  Location: [facility];  Service: Ophthalmology;  Laterality: Right;  Lot # [id number] H [place]: 01:38.6 AP%: 25.0 CDE: 46.89  . CATARACT EXTRACTION W/PHACO Left 09/02/2016   Procedure: CATARACT EXTRACTION PHACO AND INTRAOCULAR LENS PLACEMENT (IOC);  Surgeon: Estill Cotta, MD;  Location: [facility];  Service: Ophthalmology;  Laterality: Left;  [place] 2:05.3 AP% 24.9 CDE 51.84 Fluid pack lot # [id number]  . cervial polyps  1999  . [place]  . COLONOSCOPY    . CYSTECTOMY  1962   vaginal cyst removal  . DILATION AND CURETTAGE OF UTERUS  1984  . ESOPHAGOGASTRODUODENOSCOPY    . granuloma  2002   chin granuloma removed  . [place], 2002, 2004   prosthesis, condyles implants, fossa replaced  . REVISION TOTAL KNEE ARTHROPLASTY    . SIGMOIDOSCOPY  1993  .  SQUAMOUS CELL CARCINOMA EXCISION  2008   left ankle/skin graft  . [place]  . THYROIDECTOMY  2006   left  . TONSILLECTOMY AND ADENOIDECTOMY  1944  . TOTAL HIP ARTHROPLASTY Left 09/12/2013   Procedure: LEFT TOTAL HIP ARTHROPLASTY ANTERIOR APPROACH;  Surgeon: [name], MD;  Location: [facility];  Service: Orthopedics;  Laterality: Left;  . TOTAL HIP ARTHROPLASTY Right 04/18/2019   Procedure: RIGHT TOTAL HIP ARTHROPLASTY ANTERIOR APPROACH;  Surgeon: Melrose Nakayama, MD;  Location: [facility];  Service: Orthopedics;  Laterality: Right;  . TOTAL HIP ARTHROPLASTY Right 08/15/2019   Procedure: TOTAL HIP ARTHROPLASTY ANTERIOR APPROACH;  Surgeon: Melrose Nakayama, MD;  Location: [facility];  Service: Orthopedics;  Laterality: Right;  . TRAPEZIUM RESECTION  2000, 2004   removal right  & left hand trapezium  . TUBAL LIGATION    . uterine polyps  2008  . vaginal cyst removed  1962    There were no vitals filed for this visit.   Subjective Assessment - 09/30/20 1143    Subjective  My hands had been bothering me for while - about the same and use both hads evenly - R pinkie and L index and ring  finger the worse - rainy weather worse and mornings very stiff    Limitations Pt seen by Rheumathologist 09/11/20 and xray showed erosive OA in bilateral hands - pain with use and making fist - refer to OT for pain control, splinting , HEP and joint protection    Patient Stated Goals Want to improve pain and prevent my hands from getting worse    Currently in Pain? Yes    Pain Score 5     Pain Location Hand    Pain Orientation Right;Left    Pain Descriptors / Indicators Aching;Tightness;Tender    Pain Type Acute pain;Chronic pain    Pain Onset More than a month ago    Pain Frequency Intermittent    Aggravating Factors  making fist and  using them             [facility] OT Assessment - 09/30/20 0001      Assessment   Medical Diagnosis Erosive OA    Referring Provider (OT) DR Vernelle Emerald    Onset Date/Surgical Date --   several years - see rheumathologist 09/11/20   Hand Dominance Right    Prior Therapy --   PT for balance     Home  Environment   Lives With Spouse   has dementia and use cane     Prior Function   Vocation Retired    Insurance underwriter , house work, read books /tablets, Firefighter , small yard, Theatre manager   Right Hand Grip (lbs) 45   pain   Right Hand Lateral Pinch 8 lbs    Right Hand 3 Point Pinch 5 lbs   pain 3rd digit   Left Hand Grip (lbs) 40   pain   Left Hand Lateral Pinch 8 lbs    Left Hand 3 Point Pinch 10 lbs      Right Hand AROM   R Index  MCP 0-90 95 Degrees    R Index PIP 0-100 100 Degrees    R Long  MCP 0-90 100 Degrees    R Long PIP 0-100 110 Degrees    R Ring  MCP 0-90 100 Degrees    R Ring PIP 0-100 110  Degrees    R  Little  MCP 0-90 110 Degrees    R Little PIP 0-100 80 Degrees   pain     Left Hand AROM   L Index  MCP 0-90 110 Degrees    L Index PIP 0-100 85 Degrees    L Long  MCP 0-90 100 Degrees    L Long PIP 0-100 100 Degrees   pain   L Ring  MCP 0-90 100 Degrees    L Ring PIP 0-100 95 Degrees   pain   L Little  MCP 0-90 100 Degrees    L Little PIP 0-100 90 Degrees                    OT Treatments/Exercises (OP) - 09/30/20 0001      RUE Paraffin   Number Minutes Paraffin 8 Minutes    RUE Paraffin Location Hand    Comments prior to AROM      LUE Paraffin   Number Minutes Paraffin 8 Minutes    LUE Paraffin Location Hand    Comments prior to AROM           HEP review with pt  Moist heat am and pm Tendon glides -AROM after heat - not force - gentle 10 reps Opposition - oval - 8 reps pain free  Joint protection and AE trng done - review and hand out provided        OT Education - 09/30/20 1157    Education Details findings of eval and HEP    Person(s) Educated Patient    Methods Explanation;Demonstration;Tactile cues;Verbal cues;Handout    Comprehension Returned demonstration;Verbalized understanding;Verbal cues required               OT Long Term Goals - 09/30/20 1202      OT LONG TERM GOAL #1   Title Pt to be independent in HEP to decrease pain and maintain/increase AROM and prehension in R hand    Baseline pain in R 5th , 3rd with 3 point poinch , 5 lbs R , L 10 lbs ; Lat grip R and L 8 lbs 3rd and 4th digit pain ; pain on PRWHE 21/50    Time 2    Period Weeks    Status New    Target Date 10/14/20      OT LONG TERM GOAL #2   Title Pt to verbalize and use 3 joint protection and AE to decreaes pain in hands and increase ease of use of hands in ADL's and IADL's    Baseline pain 5/10 with fisting and use - no knowledge on Joint protection or AE    Time 4    Period Weeks    Status New    Target Date 10/28/20                 Plan  - 09/30/20 1158    Clinical Impression Statement Pt refer OT with diagnosis of Erosive OA in hands/ history in 2000/2004 had thumb arthroplasty - pt with increase pain with fisting , and use of hands - bilateral hands and some fingers and joints worse than other times- pt report stiffness in the am and hyper mobility in MC's and arthritis deformities in bilateral hands - decrease prehension - lower range for her age - pt can benefit from OT services to decrease pain, decrease stiffness , increase prehension and joint protection/AE trng    OT Occupational Profile and History Problem Focused Assessment - Including review of records  relating to presenting problem    Occupational performance deficits (Please refer to evaluation for details): ADL's;IADL's;Play;Leisure;Social Participation    Body Structure / Function / Physical Skills ADL;Coordination;IADL;Pain;Strength;Dexterity;FMC;Flexibility;ROM;UE functional use    Rehab Potential Fair    Clinical Decision Making Limited treatment options, no task modification necessary    Comorbidities Affecting Occupational Performance: May have comorbidities impacting occupational performance   Erosive OA   Modification or Assistance to Complete Evaluation  No modification of tasks or assist necessary to complete eval    OT Frequency 1x / week   1 x wk, then 1 x 3 wks   OT Duration 4 weeks    OT Treatment/Interventions Self-care/ADL training;Contrast Bath;Paraffin;Therapeutic exercise;Manual Therapy;DME and/or AE instruction    Consulted and Agree with Plan of Care Patient           Patient will benefit from skilled therapeutic intervention in order to improve the following deficits and impairments:   Body Structure / Function / Physical Skills: ADL,Coordination,IADL,Pain,Strength,Dexterity,FMC,Flexibility,ROM,UE functional use       Visit Diagnosis: Pain in both hands - Plan: Ot plan of care cert/re-cert  Stiffness of left hand, not elsewhere  classified - Plan: Ot plan of care cert/re-cert  Stiffness of right hand, not elsewhere classified - Plan: Ot plan of care cert/re-cert    Problem List Patient Active Problem List   Diagnosis Date Noted  . Erosive osteoarthritis of both hands 09/11/2020  . Primary osteoarthritis, left shoulder 09/11/2020  . History of revision of total replacement of right hip joint 08/15/2019  . Primary localized osteoarthritis of right hip 04/18/2019  . Primary osteoarthritis of right hip 04/18/2019  . Pectus excavatum 10/12/2017  . 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 Perimenopausal atrophic vaginitis 09/22/2016  . Menopausal hot flushes 09/22/2016  . Mitral valve prolapse 01/13/2016  . Herniation of nucleus pulposus 05/01/2015  . Hypercholesteremia 05/01/2015  . Osteoarthritis of both knees 05/01/2015  . History of migraine headaches 05/01/2015  . Esophageal reflux 01/10/2015  . S/P total hip arthroplasty 09/12/2013    Rosalyn Gess OTR/l,CLT 09/30/2020, 12:08 PM  [facility] [address] [place], [address] Phone: [phone]   Fax:  [phone]  Name: Wendy Patton MRN: [id number] Date of Birth: October 12, 1935

## 2020-10-03 DIAGNOSIS — L308 Other specified dermatitis: Secondary | ICD-10-CM | POA: Diagnosis not present

## 2020-10-08 ENCOUNTER — Other Ambulatory Visit: Payer: Self-pay

## 2020-10-08 ENCOUNTER — Encounter: Payer: Self-pay | Admitting: Physical Therapy

## 2020-10-08 ENCOUNTER — Ambulatory Visit: Payer: PPO | Admitting: Occupational Therapy

## 2020-10-08 ENCOUNTER — Ambulatory Visit: Payer: PPO | Attending: Physician Assistant | Admitting: Physical Therapy

## 2020-10-08 DIAGNOSIS — M6281 Muscle weakness (generalized): Secondary | ICD-10-CM

## 2020-10-08 DIAGNOSIS — M25612 Stiffness of left shoulder, not elsewhere classified: Secondary | ICD-10-CM | POA: Diagnosis not present

## 2020-10-08 DIAGNOSIS — M79641 Pain in right hand: Secondary | ICD-10-CM | POA: Diagnosis not present

## 2020-10-08 DIAGNOSIS — M79642 Pain in left hand: Secondary | ICD-10-CM | POA: Diagnosis not present

## 2020-10-08 DIAGNOSIS — M25512 Pain in left shoulder: Secondary | ICD-10-CM | POA: Insufficient documentation

## 2020-10-08 DIAGNOSIS — M25641 Stiffness of right hand, not elsewhere classified: Secondary | ICD-10-CM | POA: Insufficient documentation

## 2020-10-08 DIAGNOSIS — R2689 Other abnormalities of gait and mobility: Secondary | ICD-10-CM | POA: Diagnosis not present

## 2020-10-08 DIAGNOSIS — M25642 Stiffness of left hand, not elsewhere classified: Secondary | ICD-10-CM | POA: Diagnosis not present

## 2020-10-08 DIAGNOSIS — G8929 Other chronic pain: Secondary | ICD-10-CM | POA: Insufficient documentation

## 2020-10-08 NOTE — Addendum Note (Signed)
Addended by: Kelton Pillar on: 10/08/2020 03:46 PM   Modules accepted: Orders

## 2020-10-08 NOTE — Therapy (Addendum)
[facility] [address] [place], [address] Phone: [phone]   Fax:  [phone]  Physical Therapy Evaluation  Patient Details  Name: Wendy Patton MRN: [id number] Date of Birth: May 23, 1936 No data recorded  Encounter Date: 10/08/2020   PT End of Session - 10/08/20 1436    Visit Number 1    Number of Visits 17    Date for PT Re-Evaluation 12/06/20    Authorization - Visit Number 1    Authorization - Number of Visits 10    PT Start Time 0145    PT Stop Time 0230    PT Time Calculation (min) 45 min    Activity Tolerance Patient tolerated treatment well    Behavior During Therapy [facility] for tasks assessed/performed           Past Medical History:  Diagnosis Date  . Anemia    after jaw surgery was on iron for short period of time  . Arthritis   . Cancer (HCC)    squamous cell  . Difficult intubation    potential for difficult airway due to limited mouth opening  . Dry skin   . GERD (gastroesophageal reflux disease)    takes Aciphex daily  . Headache(784.0)    hx of migraines   . Heart murmur   . History of migraine    last one Aug 2014 and takes Imitrex daily as needed  . History of staph infection   . IBS (irritable bowel syndrome)   . Joint pain   . Joint swelling   . Laryngopharyngeal reflux   . MVP (mitral valve prolapse)   . Neuralgia   . Neuropathy    takes gabapentin daily  . PONV (postoperative nausea and vomiting)    TMJ surgery several times  . Rheumatic fever    as a child  . Shortness of breath    with exertion  . Thyroid disease     Past Surgical History:  Procedure Laterality Date  . ABDOMINAL HYSTERECTOMY  2009  . BREAST DUCTAL SYSTEM EXCISION Right 01/11/2014   Procedure: MAJOR DUCT EXCISION RIGHT BREAST;  Surgeon: Stark Klein, MD;  Location: [facility];  Service: General;  Laterality: Right;  . BREAST EXCISIONAL BIOPSY Right 2015   neg  . BREAST EXCISIONAL BIOPSY  Left 2010   benign papilloma removed  . BREAST EXCISIONAL BIOPSY Left 1975   neg  . BREAST SURGERY  1975, 2010   benign breast tumor  . breat excision  1975   benign breast tumor excision  . CATARACT EXTRACTION W/PHACO Right 07/15/2016   Procedure: CATARACT EXTRACTION PHACO AND INTRAOCULAR LENS PLACEMENT (IOC);  Surgeon: Estill Cotta, MD;  Location: [facility];  Service: Ophthalmology;  Laterality: Right;  Lot # [id number] H [place]: 01:38.6 AP%: 25.0 CDE: 46.89  . CATARACT EXTRACTION W/PHACO Left 09/02/2016   Procedure: CATARACT EXTRACTION PHACO AND INTRAOCULAR LENS PLACEMENT (IOC);  Surgeon: Estill Cotta, MD;  Location: [facility];  Service: Ophthalmology;  Laterality: Left;  [place] 2:05.3 AP% 24.9 CDE 51.84 Fluid pack lot # [id number]  . cervial polyps  1999  . [place]  . COLONOSCOPY    . CYSTECTOMY  1962   vaginal cyst removal  . DILATION AND CURETTAGE OF UTERUS  1984  . ESOPHAGOGASTRODUODENOSCOPY    . granuloma  2002   chin granuloma removed  . [place], 2002, 2004   prosthesis, condyles implants, fossa replaced  .  REVISION TOTAL KNEE ARTHROPLASTY    . SIGMOIDOSCOPY  1993  . SQUAMOUS CELL CARCINOMA EXCISION  2008   left ankle/skin graft  . [place]  . THYROIDECTOMY  2006   left  . TONSILLECTOMY AND ADENOIDECTOMY  1944  . TOTAL HIP ARTHROPLASTY Left 09/12/2013   Procedure: LEFT TOTAL HIP ARTHROPLASTY ANTERIOR APPROACH;  Surgeon: [name], MD;  Location: [place];  Service: Orthopedics;  Laterality: Left;  . TOTAL HIP ARTHROPLASTY Right 04/18/2019   Procedure: RIGHT TOTAL HIP ARTHROPLASTY ANTERIOR APPROACH;  Surgeon: Melrose Nakayama, MD;  Location: [facility];  Service: Orthopedics;  Laterality: Right;  . TOTAL HIP ARTHROPLASTY Right 08/15/2019   Procedure: TOTAL HIP ARTHROPLASTY ANTERIOR APPROACH;  Surgeon: Melrose Nakayama, MD;  Location: [facility];  Service: Orthopedics;  Laterality: Right;  .  TRAPEZIUM RESECTION  2000, 2004   removal right & left hand trapezium  . TUBAL LIGATION    . uterine polyps  2008  . vaginal cyst removed  1962    There were no vitals filed for this visit.   Subjective Assessment - 10/08/20 1349    Pertinent History Pt is a 85 year old female familiar with this clinic, d/c for L knee pain May 2021. Reports she has had L shoulder pain over the past couple years, but it has worsened over the past couple months. Has had multiple cortisone shots in her L shoulder, last one was 2 months ago with pain relief for a few weeks.Pain is sharp in nature, at posterior shoulder at lateral scapula border. Does not radiate to arm or neck, denies numbness tingling/pins and needles. Worst pain over past week 8/10; best 1/10. Pain with reaching overhead, across the body, and behind the back. Pain with any overhead lifting or activities, and has pain following pushing/pulling activities such as mopping, pushing WC for husband (occassionally), vacuming; and following sleeping on her R side when her L shoulder is IR'd.  Pt is retired, enjoys doing yoga and kniting. She lives at home with husband, drive, complete all community errands, ind with all ADLs. Pt denies N/V, B&B changes, unexplained weight fluctuation, saddle paresthesia, fever, night sweats, or unrelenting night pain at this time. No falls in past 6 months.    Limitations Lifting;Walking;House hold activities    How long can you sit comfortably? unlimited    How long can you stand comfortably? unlimited    How long can you walk comfortably? unlimited    Diagnostic tests Xrays last year reports only OA seen    Patient Stated Goals Reduce pain    Currently in Pain? Yes    Pain Score 1     Pain Location Shoulder    Pain Orientation Left;Posterior    Pain Descriptors / Indicators Sharp    Pain Type Chronic pain    Pain Radiating Towards none    Pain Onset More than a month ago    Pain Frequency Intermittent     Aggravating Factors  pushing, pulling, overhead, lifting/reaching, reaching across body or behind the back    Pain Relieving Factors ice, temporarily cortisone, tylenol    Effect of Pain on Daily Activities unable to complete household ADLs            Dominant hand: Right  OBJECTIVE  MUSCULOSKELETAL: Tremor: Normal Bulk: Normal Tone: Normal  Cervical Screen AROM: WFL and painless with overpressure in all planes; minimal limitation in rotation Rotation R 70 L 63 Spurlings A (ipsilateral lateral flexion/axial compression): Negative  Spurlings B (ipsilateral lateral flexion/contralateral rotation/axial compression): Negative Repeated movement: No centralization or peripheralization with protraction or retraction  Elbow Screen Elbow AROM: WNL bilat  Palpation TTP with withdrawal and grimace to palpation at posterior GHJ, secondary TTP at superior humeral head (supraspinatus insertion)  Strength R/L 5/2+ (4+ in available range) Shoulder flexion (anterior deltoid/pec major/coracobrachialis, axillary n. (C5-6) and musculocutaneous n. (C5-7)) 5/2+ (4+ in available range) Shoulder abduction (deltoid/supraspinatus, axillary/suprascapular n, C5) 5/4- Shoulder external rotation (infraspinatus/teres minor) 5/3+ Shoulder internal rotation (subcapularis/lats/pec major) 5/5 Shoulder extension (posterior deltoid, lats, teres major, axillary/thoracodorsal n.) 5/4- Shoulder horizontal abduction 5/5 Elbow flexion (biceps brachii, brachialis, brachioradialis, musculoskeletal n, C5-6) 5/5 Elbow extension (triceps, radial n, C7) 4/2- Y lower trap 5/3- T scapular retractors   AROM R/L 180/114 Shoulder flexion 180/93 Shoulder abduction C8/C7* Shoulder external rotation T10/T12* Shoulder internal rotation 60/60 Shoulder extension *Indicates pain, overpressure performed unless otherwise indicated  PROM R/L 180/128 Shoulder flexion 180/103 Shoulder abduction 90/55 Shoulder external  rotation 70/80 Shoulder internal rotation 60/60 Shoulder extension *Indicates pain, overpressure performed unless otherwise indicated  Accessory Motions/Glides Glenohumeral: Posterior: Normal joint mobility; guarded  Inferior: Normal joint mobility; guarded Anterior:Normal joint mobility; guarded  Acromioclavicular:  Posterior:normal  Anterior: normal  Sternoclavicular: Posterior: normal Anterior: normal Superior:normal Inferior: normal  Scapulothoracic: All passive motions WNL, difficulty with active retraction   Muscle Length Testing Pectoralis Major: normal Pectoralis Minor: normal Biceps: normal  NEUROLOGICAL:  Mental Status Patient is oriented to person, place and time.  Recent memory is intact.  Remote memory is intact.  Attention span and concentration are intact.  Expressive speech is intact.  Patient's fund of knowledge is within normal limits for educational level.  Cranial Nerves Visual acuity and visual fields are intact  Extraocular muscles are intact  Facial sensation is intact bilaterally  Facial strength is intact bilaterally  Hearing is normal as tested by gross conversation Palate elevates midline, normal phonation  Shoulder shrug strength is intact  Tongue protrudes midline   Sensation Grossly intact to light touch bilateral UE as determined by testing dermatomes C2-T2 Proprioception and hot/cold testing deferred on this date   SPECIAL TESTS  Rotator Cuff  Drop Arm Test: Negative  Painful Arc (Pain from 60 to 120 degrees scaption): Positive Infraspinatus Muscle Test: Negative  Subacromial Impingement Hawkins-Kennedy: Positive Neer (Block scapula, PROM flexion): Negative Painful Arc (Pain from 60 to 120 degrees scaption): Positive Empty Can: Negative External Rotation Resistance: Negative Horizontal Adduction: Postive Scapular Assist:  Negative  Labral Tear Biceps Load II (120 elevation, full ER, 90 elbow flexion, full  supination, resisted elbow flexion): Negative  Crank (160 scaption, axial load with IR/ER):positive Active Compression Test: Negative  Bicep Tendon Pathology Speed (shoulder flexion to 90, external rotation, full elbow extension, and forearm supination with resistance:Negative  Yergason's (resisted shoulder ER and supination/biceps tendon pathology): Negative  Shoulder Instability Sulcus Sign: Negative Anterior Apprehension: Negative  Ther-Ex PT reviewed the following HEP with patient with patient able to demonstrate a set of the following with min cuing for correction needed. PT educated patient on parameters of therex (how/when to inc/decrease intensity, frequency, rep/set range, stretch hold time, and purpose of therex) with verbalized understanding.  Access Code: [email or web address] Seated Shoulder Flexion AAROM with Pulley Behind - 2-3 x daily - 7 x weekly - 15 reps - 3-5sec hold Seated Shoulder Abduction AAROM with Pulley Behind - 2-3 x daily - 7 x weekly - 15 reps - 3-5sec hold Standing Shoulder Internal Rotation Stretch with Towel - 2-3 x  daily - 7 x weekly - 15 reps - 3-5sec hold Seated Scapular Retraction - 8 x daily - 7 x weekly - 12 reps - 3-5sec hold                                   PT Short Term Goals - 10/08/20 1437      PT SHORT TERM GOAL #1   Title Pt will be independent with HEP in order to improve strength and decrease pain in order to improve pain-free function at home and work.    Baseline 10/08/20 HEP given    Time 4    Period Weeks    Status New             PT Long Term Goals - 10/08/20 1437      PT LONG TERM GOAL #1   Title Patient will increase FOTO score to 64 to demonstrate predicted increase in functional mobility to complete ADLs    Baseline 10/08/20 51      PT LONG TERM GOAL #2   Title Pt will decrease worst pain as reported on NPRS by at least 3 points in order to demonstrate clinically significant reduction in pain.     Baseline 10/09/19 8/10    Time 8    Period Weeks    Status New      PT LONG TERM GOAL #3   Title Patient will demonstrate at least 4/5 shoulder and periscapular strength in order to complete heavy household ADLs    Baseline 10/08/20 shoulder flex: 2+; abd 2+; ER 4- ; IR 3+ ; Y 2- ; T 3-    Time 8    Period Weeks    Status New      PT LONG TERM GOAL #4   Title Patient will demonstrate full, pain free active shoulder ROM in order to complete overhead reaching for ADLs, and ind bathing and dressing    Baseline 10/08/20 flex: 114; abd 93; IR T12 with pain    Time 8    Period Weeks    Status New                 Plan - 10/08/20 1512    Clinical Impression Statement Pt presents to PT with L shoulder pain consistent with OA and subacromial impingement. Impairments in decreased L shoulder P/AROM, shoulder and periscapular strength, pain, postural impairments, and decreased activity tolerance. Activity limitations in pushing, pulling, overhead, across the body, and behind the back reaching; inhibiting participation in household ADLs and iADLS. Patient will benefit from skilled PT to address impairments to return patient to optimal PLOF.    Personal Factors and Comorbidities Comorbidity 1;Comorbidity 2;Comorbidity 3+;Age;Fitness;Past/Current Experience;Sex    Comorbidities bilat neuropathy, arthritis, anemia, history of cancer    Examination-Activity Limitations Lift;Reach Overhead;Carry;Bathing;Dressing;Hygiene/Grooming;Sleep    Examination-Participation Restrictions Meal Prep;Community Activity;Cleaning;Laundry;Yard Work;Driving    Stability/Clinical Decision Making Evolving/Moderate complexity    Clinical Decision Making Moderate    Rehab Potential Good    PT Frequency 2x / week    PT Duration 8 weeks    PT Treatment/Interventions ADLs/Self Care Home Management;Cryotherapy;Ultrasound;Stair training;Balance training;Dry needling;Moist Heat;Traction;Gait training;Therapeutic  exercise;Patient/family education;Manual techniques;Passive range of motion;Electrical Stimulation;Functional mobility training;Therapeutic activities;Neuromuscular re-education;Energy conservation;Joint Manipulations;Spinal Manipulations    PT Next Visit Plan HEP review    PT Home Exercise Plan pulleys abd/flex, towel IR AAROM, scapular retractions    Consulted and Agree with Plan of Care  Patient           Patient will benefit from skilled therapeutic intervention in order to improve the following deficits and impairments:  Decreased endurance,Decreased mobility,Decreased range of motion,Improper body mechanics,Pain,Postural dysfunction,Impaired flexibility,Increased fascial restricitons,Decreased strength,Decreased coordination,Decreased activity tolerance,Increased muscle spasms  Visit Diagnosis: Chronic left shoulder pain  Stiffness of left shoulder, not elsewhere classified     Problem List Patient Active Problem List   Diagnosis Date Noted  . Erosive osteoarthritis of both hands 09/11/2020  . Primary osteoarthritis, left shoulder 09/11/2020  . History of revision of total replacement of right hip joint 08/15/2019  . Primary localized osteoarthritis of right hip 04/18/2019  . Primary osteoarthritis of right hip 04/18/2019  . Pectus excavatum 10/12/2017  . 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 Perimenopausal atrophic vaginitis 09/22/2016  . Menopausal hot flushes 09/22/2016  . Mitral valve prolapse 01/13/2016  . Herniation of nucleus pulposus 05/01/2015  . Hypercholesteremia 05/01/2015  . Osteoarthritis of both knees 05/01/2015  . History of migraine headaches 05/01/2015  . Esophageal reflux 01/10/2015  . S/P total hip arthroplasty 09/12/2013   Durwin Reges DPT Durwin Reges 10/08/2020, 3:44 PM  [facility] [address] [place], [address] Phone: [phone]   Fax:  [phone]  Name:  CHAZLYN CUDE MRN: [id number] Date of Birth: 1936-05-19

## 2020-10-08 NOTE — Therapy (Signed)
[place] [facility] [address] [place], [address] Phone: [phone]   Fax:  [phone]  Occupational Therapy Treatment  Patient Details  Name: Wendy Patton MRN: [id number] Date of Birth: 06-Jul-1936 Referring Provider (OT): DR Vernelle Emerald   Encounter Date: 10/08/2020   OT End of Session - 10/08/20 1304    Visit Number 2    Number of Visits 3    Date for OT Re-Evaluation 10/28/20    OT Start Time 1230    OT Stop Time 1304    OT Time Calculation (min) 34 min    Activity Tolerance Patient tolerated treatment well    Behavior During Therapy [facility] for tasks assessed/performed           Past Medical History:  Diagnosis Date  . Anemia    after jaw surgery was on iron for short period of time  . Arthritis   . Cancer (HCC)    squamous cell  . Difficult intubation    potential for difficult airway due to limited mouth opening  . Dry skin   . GERD (gastroesophageal reflux disease)    takes Aciphex daily  . Headache(784.0)    hx of migraines   . Heart murmur   . History of migraine    last one Aug 2014 and takes Imitrex daily as needed  . History of staph infection   . IBS (irritable bowel syndrome)   . Joint pain   . Joint swelling   . Laryngopharyngeal reflux   . MVP (mitral valve prolapse)   . Neuralgia   . Neuropathy    takes gabapentin daily  . PONV (postoperative nausea and vomiting)    TMJ surgery several times  . Rheumatic fever    as a child  . Shortness of breath    with exertion  . Thyroid disease     Past Surgical History:  Procedure Laterality Date  . ABDOMINAL HYSTERECTOMY  2009  . BREAST DUCTAL SYSTEM EXCISION Right 01/11/2014   Procedure: MAJOR DUCT EXCISION RIGHT BREAST;  Surgeon: Stark Klein, MD;  Location: [facility];  Service: General;  Laterality: Right;  . BREAST EXCISIONAL BIOPSY Right 2015   neg  . BREAST EXCISIONAL BIOPSY Left 2010   benign papilloma removed  .  BREAST EXCISIONAL BIOPSY Left 1975   neg  . BREAST SURGERY  1975, 2010   benign breast tumor  . breat excision  1975   benign breast tumor excision  . CATARACT EXTRACTION W/PHACO Right 07/15/2016   Procedure: CATARACT EXTRACTION PHACO AND INTRAOCULAR LENS PLACEMENT (IOC);  Surgeon: Estill Cotta, MD;  Location: [facility];  Service: Ophthalmology;  Laterality: Right;  Lot # [id number] H [place]: 01:38.6 AP%: 25.0 CDE: 46.89  . CATARACT EXTRACTION W/PHACO Left 09/02/2016   Procedure: CATARACT EXTRACTION PHACO AND INTRAOCULAR LENS PLACEMENT (IOC);  Surgeon: Estill Cotta, MD;  Location: [facility];  Service: Ophthalmology;  Laterality: Left;  [place] 2:05.3 AP% 24.9 CDE 51.84 Fluid pack lot # [id number]  . cervial polyps  1999  . [place]  . COLONOSCOPY    . CYSTECTOMY  1962   vaginal cyst removal  . DILATION AND CURETTAGE OF UTERUS  1984  . ESOPHAGOGASTRODUODENOSCOPY    . granuloma  2002   chin granuloma removed  . [place], 2002, 2004   prosthesis, condyles implants, fossa replaced  . REVISION TOTAL KNEE ARTHROPLASTY    . SIGMOIDOSCOPY  1993  .  SQUAMOUS CELL CARCINOMA EXCISION  2008   left ankle/skin graft  . [place]  . THYROIDECTOMY  2006   left  . TONSILLECTOMY AND ADENOIDECTOMY  1944  . TOTAL HIP ARTHROPLASTY Left 09/12/2013   Procedure: LEFT TOTAL HIP ARTHROPLASTY ANTERIOR APPROACH;  Surgeon: [name], MD;  Location: [place];  Service: Orthopedics;  Laterality: Left;  . TOTAL HIP ARTHROPLASTY Right 04/18/2019   Procedure: RIGHT TOTAL HIP ARTHROPLASTY ANTERIOR APPROACH;  Surgeon: Melrose Nakayama, MD;  [facility];  Service: Orthopedics;  Laterality: Right;  . TOTAL HIP ARTHROPLASTY Right 08/15/2019   Procedure: TOTAL HIP ARTHROPLASTY ANTERIOR APPROACH;  Surgeon: Melrose Nakayama, MD;  [facility];  Service: Orthopedics;  Laterality: Right;  . TRAPEZIUM RESECTION  2000, 2004   removal right  & left hand trapezium  . TUBAL LIGATION    . uterine polyps  2008  . vaginal cyst removed  1962    There were no vitals filed for this visit.   Subjective Assessment - 10/08/20 1237    Subjective  DId get the paraffin bath but you need to tell me how to use, did get spring loaded scissors, love it. Where can I get the compression sleeves for fingers , and how many times to do the exericses    Limitations Pt seen by Rheumathologist 09/11/20 and xray showed erosive OA in bilateral hands - pain with use and making fist - refer to OT for pain control, splinting , HEP and joint protection    Patient Stated Goals Want to improve pain and prevent my hands from getting worse    Currently in Pain? Yes    Pain Score 2     Pain Location Hand    Pain Orientation Right;Left    Pain Descriptors / Indicators Aching;Tender;Tightness    Pain Type Chronic pain;Acute pain    Pain Onset More than a month ago    Pain Frequency Intermittent              [facility] OT Assessment - 10/08/20 0001      Strength   Right Hand Grip (lbs) 45    Right Hand Lateral Pinch 8 lbs    Right Hand 3 Point Pinch 7 lbs    Left Hand Grip (lbs) 46    Left Hand Lateral Pinch 8 lbs    Left Hand 3 Point Pinch 10 lbs      Right Hand AROM   R Long  MCP 0-90 100 Degrees    R Long PIP 0-100 110 Degrees    R Ring  MCP 0-90 100 Degrees    R Ring PIP 0-100 110 Degrees    R Little  MCP 0-90 110 Degrees    R Little PIP 0-100 85 Degrees   pain     Left Hand AROM   L Index  MCP 0-90 110 Degrees    L Index PIP 0-100 85 Degrees    L Long  MCP 0-90 100 Degrees    L Long PIP 0-100 100 Degrees    L Ring  MCP 0-90 100 Degrees    L Ring PIP 0-100 95 Degrees    L Little  MCP 0-90 100 Degrees    L Little PIP 0-100 95 Degrees          measurement taken - pt maintain her AROM in bilateral hands - decrease pain in L hand and increase 5th PIP flexion  Pain still in R 5th PIP -and  decrease flexion - increase MC flexion compensating   Grip increase in R hand -but L same- because of cont pain in R 5th  L 3 point grip increase too   Pt ed on setting up her paraffin and use of paraffin bath - am and pm  Review with her    Tendon glides -AROM after heat - not force - REINFORCE again - gentle 10 reps Opposition - oval - 8 reps pain free  Joint protection and AE trng  review and hand out provided last time  pt report using larger joints to lift , carry and hold object Did get spring loaded scissors  And larger knitting pens - using  Provided info on compression sleeve - silicon to wear on R 5th PIP- can order from [employer]  Pt want to follow up in month - to see if she is doing well with HEP -and progressing [place]               OT Education - 10/08/20 1246    Education Details progress and HEP review    Person(s) Educated Patient    Methods Explanation;Demonstration;Tactile cues;Verbal cues;Handout    Comprehension Returned demonstration;Verbalized understanding;Verbal cues required               OT Long Term Goals - 09/30/20 1202      OT LONG TERM GOAL #1   Title Pt to be independent in HEP to decrease pain and maintain/increase AROM and prehension in R hand    Baseline pain in R 5th , 3rd with 3 point poinch , 5 lbs R , L 10 lbs ; Lat grip R and L 8 lbs 3rd and 4th digit pain ; pain on PRWHE 21/50    Time 2    Period Weeks    Status New    Target Date 10/14/20      OT LONG TERM GOAL #2   Title Pt to verbalize and use 3 joint protection and AE to decreaes pain in hands and increase ease of use of hands in ADL's and IADL's    Baseline pain 5/10 with fisting and use - no knowledge on Joint protection or AE    Time 4    Period Weeks    Status New    Target Date 10/28/20                 Plan - 10/08/20 1304    Clinical Impression Statement Pt with diagnosis of Erosive OA in bilateral hands / s/p bilateral thumb arthroplasty 2000/2004 - pt show decrease pain in L hand with increase  grip strenthg and 3 point grip - R 5h digit PIP cont to be 2/10 pain - pt fitted with compression sleeve to wear on R 5th PIP -and ed on use of her parafin bath at home- pt will return in month to assess if able to improve and maintain progress in bilateral hands    OT Occupational Profile and History Problem Focused Assessment - Including review of records relating to presenting problem    Occupational performance deficits (Please refer to evaluation for details): ADL's;IADL's;Play;Leisure;Social Participation    Body Structure / Function / Physical Skills ADL;Coordination;IADL;Pain;Strength;Dexterity;FMC;Flexibility;ROM;UE functional use    Rehab Potential Fair    Clinical Decision Making Limited treatment options, no task modification necessary    Comorbidities Affecting Occupational Performance: May have comorbidities impacting occupational performance    Modification or Assistance to Complete Evaluation  No modification of tasks or assist necessary  to complete eval    OT Frequency Monthly    OT Duration 4 weeks    OT Treatment/Interventions Self-care/ADL training;Contrast Bath;Paraffin;Therapeutic exercise;Manual Therapy;DME and/or AE instruction    Consulted and Agree with Plan of Care Patient           Patient will benefit from skilled therapeutic intervention in order to improve the following deficits and impairments:   Body Structure / Function / Physical Skills: ADL,Coordination,IADL,Pain,Strength,Dexterity,FMC,Flexibility,ROM,UE functional use       Visit Diagnosis: Pain in both hands  Stiffness of left hand, not elsewhere classified  Stiffness of right hand, not elsewhere classified  Muscle weakness (generalized)    Problem List Patient Active Problem List   Diagnosis Date Noted  . Erosive osteoarthritis of both hands 09/11/2020  . Primary osteoarthritis, left shoulder 09/11/2020  . History of revision of total replacement of right hip joint 08/15/2019  . Primary  localized osteoarthritis of right hip 04/18/2019  . Primary osteoarthritis of right hip 04/18/2019  . Pectus excavatum 10/12/2017  . 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 Perimenopausal atrophic vaginitis 09/22/2016  . Menopausal hot flushes 09/22/2016  . Mitral valve prolapse 01/13/2016  . Herniation of nucleus pulposus 05/01/2015  . Hypercholesteremia 05/01/2015  . Osteoarthritis of both knees 05/01/2015  . History of migraine headaches 05/01/2015  . Esophageal reflux 01/10/2015  . S/P total hip arthroplasty 09/12/2013    Rosalyn Gess OTR/L,CLT 10/08/2020, 1:08 PM  [place] [facility] [address], [place], [address] Phone: [phone]   Fax:  [phone]  Name: Wendy Patton MRN: [id number] Date of Birth: [birth year]

## 2020-10-15 ENCOUNTER — Ambulatory Visit: Payer: PPO | Admitting: Physical Therapy

## 2020-10-15 ENCOUNTER — Encounter: Payer: Self-pay | Admitting: Physical Therapy

## 2020-10-15 ENCOUNTER — Other Ambulatory Visit: Payer: Self-pay

## 2020-10-15 DIAGNOSIS — M25512 Pain in left shoulder: Secondary | ICD-10-CM

## 2020-10-15 DIAGNOSIS — M25612 Stiffness of left shoulder, not elsewhere classified: Secondary | ICD-10-CM

## 2020-10-15 DIAGNOSIS — G8929 Other chronic pain: Secondary | ICD-10-CM

## 2020-10-15 DIAGNOSIS — R2689 Other abnormalities of gait and mobility: Secondary | ICD-10-CM

## 2020-10-15 NOTE — Therapy (Signed)
[place] [facility] [address] [place], [address] Phone: [phone]   Fax:  [phone]  Physical Therapy Treatment  Patient Details  Name: STEPHANI JANAK MRN: [id number] Date of Birth: [birth year] No data recorded  Encounter Date: 10/15/2020   PT End of Session - 10/15/20 1502    Visit Number 2    Number of Visits 17    Date for PT Re-Evaluation 12/06/20    Authorization - Visit Number 2    Authorization - Number of Visits 10    PT Start Time 0230    PT Stop Time 0310    PT Time Calculation (min) 40 min    Equipment Utilized During Treatment Gait belt    Activity Tolerance Patient tolerated treatment well    Behavior During Therapy [facility] for tasks assessed/performed           Past Medical History:  Diagnosis Date  . Anemia    after jaw surgery was on iron for short period of time  . Arthritis   . Cancer (HCC)    squamous cell  . Difficult intubation    potential for difficult airway due to limited mouth opening  . Dry skin   . GERD (gastroesophageal reflux disease)    takes Aciphex daily  . Headache(784.0)    hx of migraines   . Heart murmur   . History of migraine    last one Aug 2014 and takes Imitrex daily as needed  . History of staph infection   . IBS (irritable bowel syndrome)   . Joint pain   . Joint swelling   . Laryngopharyngeal reflux   . MVP (mitral valve prolapse)   . Neuralgia   . Neuropathy    takes gabapentin daily  . PONV (postoperative nausea and vomiting)    TMJ surgery several times  . Rheumatic fever    as a child  . Shortness of breath    with exertion  . Thyroid disease     Past Surgical History:  Procedure Laterality Date  . ABDOMINAL HYSTERECTOMY  2009  . BREAST DUCTAL SYSTEM EXCISION Right 01/11/2014   Procedure: MAJOR DUCT EXCISION RIGHT BREAST;  Surgeon: Stark Klein, MD;  [facility];  Service: General;  Laterality: Right;  . BREAST EXCISIONAL  BIOPSY Right 2015   neg  . BREAST EXCISIONAL BIOPSY Left 2010   benign papilloma removed  . BREAST EXCISIONAL BIOPSY Left 1975   neg  . BREAST SURGERY  1975, 2010   benign breast tumor  . breat excision  1975   benign breast tumor excision  . CATARACT EXTRACTION W/PHACO Right 07/15/2016   Procedure: CATARACT EXTRACTION PHACO AND INTRAOCULAR LENS PLACEMENT (IOC);  Surgeon: Estill Cotta, MD;  Location: [facility];  Service: Ophthalmology;  Laterality: Right;  Lot # [id number] H [place]: 01:38.6 AP%: 25.0 CDE: 46.89  . CATARACT EXTRACTION W/PHACO Left 09/02/2016   Procedure: CATARACT EXTRACTION PHACO AND INTRAOCULAR LENS PLACEMENT (IOC);  Surgeon: Estill Cotta, MD;  Location: [facility];  Service: Ophthalmology;  Laterality: Left;  [place] 2:05.3 AP% 24.9 CDE 51.84 Fluid pack lot # [id number]  . cervial polyps  1999  . [place]  . COLONOSCOPY    . CYSTECTOMY  1962   vaginal cyst removal  . DILATION AND CURETTAGE OF UTERUS  1984  . ESOPHAGOGASTRODUODENOSCOPY    . granuloma  2002   chin granuloma removed  . [place],  2002, 2004   prosthesis, condyles implants, fossa replaced  . REVISION TOTAL KNEE ARTHROPLASTY    . SIGMOIDOSCOPY  1993  . SQUAMOUS CELL CARCINOMA EXCISION  2008   left ankle/skin graft  . [place]  . THYROIDECTOMY  2006   left  . TONSILLECTOMY AND ADENOIDECTOMY  1944  . TOTAL HIP ARTHROPLASTY Left 09/12/2013   Procedure: LEFT TOTAL HIP ARTHROPLASTY ANTERIOR APPROACH;  Surgeon: [name], MD;  Location: [place] [facility];  Service: Orthopedics;  Laterality: Left;  . TOTAL HIP ARTHROPLASTY Right 04/18/2019   Procedure: RIGHT TOTAL HIP ARTHROPLASTY ANTERIOR APPROACH;  Surgeon: Melrose Nakayama, MD;  [facility];  Service: Orthopedics;  Laterality: Right;  . TOTAL HIP ARTHROPLASTY Right 08/15/2019   Procedure: TOTAL HIP ARTHROPLASTY ANTERIOR APPROACH;  Surgeon: Melrose Nakayama, MD;  [facility];   Service: Orthopedics;  Laterality: Right;  . TRAPEZIUM RESECTION  2000, 2004   removal right & left hand trapezium  . TUBAL LIGATION    . uterine polyps  2008  . vaginal cyst removed  1962    There were no vitals filed for this visit.   Subjective Assessment - 10/15/20 1437    Subjective Patient thinks she overdid the abd pulleys over the weekend. Reports 5/10 pain today.    Pertinent History Pt is a 85 year old female familiar with this clinic, d/c for L knee pain May 2021. Reports she has had L shoulder pain over the past couple years, but it has worsened over the past couple months. Has had multiple cortisone shots in her L shoulder, last one was 2 months ago with pain relief for a few weeks.Pain is sharp in nature, at posterior shoulder at lateral scapula border. Does not radiate to arm or neck, denies numbness tingling/pins and needles. Worst pain over past week 8/10; best 1/10. Pain with reaching overhead, across the body, and behind the back. Pain with any overhead lifting or activities, and has pain following pushing/pulling activities such as mopping, pushing WC for husband (occassionally), vacuming; and following sleeping on her R side when her L shoulder is IR'd.  Pt is retired, enjoys doing yoga and kniting. She lives at home with husband, drive, complete all community errands, ind with all ADLs. Pt denies N/V, B&B changes, unexplained weight fluctuation, saddle paresthesia, fever, night sweats, or unrelenting night pain at this time. No falls in past 6 months.    Limitations Lifting;Walking;House hold activities    How long can you sit comfortably? unlimited    How long can you stand comfortably? unlimited    How long can you walk comfortably? unlimited    Diagnostic tests Xrays last year reports only OA seen    Patient Stated Goals Reduce pain    Pain Onset More than a month ago           Ther-Ex Seated Shoulder Flexion AAROM with pulley x15 3sec Seated Shoulder Abduction  AAROM with pulley x15 3sec hold Standing shoulder IR AAROM towel x15 3sec hold with good motion  Supine scapular punches x12; w/ 2# DB 2x 10 with good carry over following demo for set up Seated scaption x12 with good carry over of cuing for scapulohumeral rhythm; w/ 1# 2x 7/8 ER and IR with RTB 2x 10 bilat with cuing for set up with good carry over Seated Scapular Retraction   Manual G1 posterior GHJ mobilization in 90d abd 4x 30sec for pain reduction; G3 post mobilization with movement into abd/flex/ER/IR for increased  mobility  PROM all directions                       PT Education - 10/15/20 1438    Education Details therex form/technique    Person(s) Educated Patient    Methods Explanation;Demonstration;Verbal cues    Comprehension Verbalized understanding;Returned demonstration;Verbal cues required            PT Short Term Goals - 10/08/20 1437      PT SHORT TERM GOAL #1   Title Pt will be independent with HEP in order to improve strength and decrease pain in order to improve pain-free function at home and work.    Baseline 10/08/20 HEP given    Time 4    Period Weeks    Status New             PT Long Term Goals - 10/08/20 1437      PT LONG TERM GOAL #1   Title Patient will increase FOTO score to 64 to demonstrate predicted increase in functional mobility to complete ADLs    Baseline 10/08/20 51      PT LONG TERM GOAL #2   Title Pt will decrease worst pain as reported on NPRS by at least 3 points in order to demonstrate clinically significant reduction in pain.    Baseline 10/09/19 8/10    Time 8    Period Weeks    Status New      PT LONG TERM GOAL #3   Title Patient will demonstrate at least 4/5 shoulder and periscapular strength in order to complete heavy household ADLs    Baseline 10/08/20 shoulder flex: 2+; abd 2+; ER 4- ; IR 3+ ; Y 2- ; T 3-    Time 8    Period Weeks    Status New      PT LONG TERM GOAL #4   Title Patient will demonstrate  full, pain free active shoulder ROM in order to complete overhead reaching for ADLs, and ind bathing and dressing    Baseline 10/08/20 flex: 114; abd 93; IR T12 with pain    Time 8    Period Weeks    Status New                 Plan - 10/15/20 1509    Clinical Impression Statement PT initiated therex progression for increased mobility, and shoulder/periscapular strength with success. PT utilized manual techniques for pain reduction and increased mobility as well. Pt is able to comply with all cuing for proper technique of therex with good motivation throughout session, minimal increase in pain. PT will continue progression as able.    Personal Factors and Comorbidities Comorbidity 1;Comorbidity 2;Comorbidity 3+;Age;Fitness;Past/Current Experience;Sex    Comorbidities bilat neuropathy, arthritis, anemia, history of cancer    Examination-Activity Limitations Lift;Reach Overhead;Carry;Bathing;Dressing;Hygiene/Grooming;Sleep    Examination-Participation Restrictions Meal Prep;Community Activity;Cleaning;Laundry;Yard Work;Driving    Stability/Clinical Decision Making Evolving/Moderate complexity    Clinical Decision Making Moderate    Rehab Potential Good    PT Frequency 2x / week    PT Duration 8 weeks    PT Treatment/Interventions ADLs/Self Care Home Management;Cryotherapy;Ultrasound;Stair training;Balance training;Dry needling;Moist Heat;Traction;Gait training;Therapeutic exercise;Patient/family education;Manual techniques;Passive range of motion;Electrical Stimulation;Functional mobility training;Therapeutic activities;Neuromuscular re-education;Energy conservation;Joint Manipulations;Spinal Manipulations    PT Next Visit Plan HEP review    PT Home Exercise Plan pulleys abd/flex, towel IR AAROM, scapular retractions    Consulted and Agree with Plan of Care Patient  Patient will benefit from skilled therapeutic intervention in order to improve the following deficits and  impairments:  Decreased endurance,Decreased mobility,Decreased range of motion,Improper body mechanics,Pain,Postural dysfunction,Impaired flexibility,Increased fascial restricitons,Decreased strength,Decreased coordination,Decreased activity tolerance,Increased muscle spasms  Visit Diagnosis: No diagnosis found.     Problem List Patient Active Problem List   Diagnosis Date Noted  . Erosive osteoarthritis of both hands 09/11/2020  . Primary osteoarthritis, left shoulder 09/11/2020  . History of revision of total replacement of right hip joint 08/15/2019  . Primary localized osteoarthritis of right hip 04/18/2019  . Primary osteoarthritis of right hip 04/18/2019  . Pectus excavatum 10/12/2017  . 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 Perimenopausal atrophic vaginitis 09/22/2016  . Menopausal hot flushes 09/22/2016  . Mitral valve prolapse 01/13/2016  . Herniation of nucleus pulposus 05/01/2015  . Hypercholesteremia 05/01/2015  . Osteoarthritis of both knees 05/01/2015  . History of migraine headaches 05/01/2015  . Esophageal reflux 01/10/2015  . S/P total hip arthroplasty 09/12/2013   Durwin Reges DPT Durwin Reges 10/15/2020, 3:31 PM  [place] [facility] [address], [place], [address] Phone: [phone]   Fax:  [phone]  Name: LINDZEY ZENT MRN: [id number] Date of Birth: [birth year]

## 2020-10-17 ENCOUNTER — Encounter: Payer: PPO | Admitting: Physical Therapy

## 2020-10-22 ENCOUNTER — Ambulatory Visit: Payer: PPO | Admitting: Physical Therapy

## 2020-10-24 ENCOUNTER — Encounter: Payer: PPO | Admitting: Physical Therapy

## 2020-10-29 ENCOUNTER — Encounter: Payer: PPO | Admitting: Physical Therapy

## 2020-10-31 ENCOUNTER — Encounter: Payer: PPO | Admitting: Physical Therapy

## 2020-11-07 ENCOUNTER — Encounter: Payer: PPO | Admitting: Occupational Therapy

## 2020-11-28 NOTE — Patient Instructions (Signed)

## 2020-11-28 NOTE — Progress Notes (Signed)
I,April Miller,acting as a Education administrator for [employer], PA-C.,have documented all relevant documentation on the behalf of Vernie Murders, PA-C,as directed by  [employer], PA-C while in the presence of [employer], PA-C.   Established patient visit   Patient: Wendy Patton   DOB: 12/28/1935   85 y.o. Female  MRN: [id number] Visit Date: 11/29/2020  Today's healthcare provider: Vernie Murders, PA-C   Chief Complaint  Patient presents with  . Follow-up  . Hypertension   Subjective    HPI  Hypertension, follow-up  BP Readings from Last 3 Encounters:  11/29/20 120/71  09/11/20 (!) 169/83  06/18/20 (!) 145/59   Wt Readings from Last 3 Encounters:  11/29/20 129 lb 6.4 oz (58.7 kg)  09/11/20 135 lb 12.8 oz (61.6 kg)  06/18/20 133 lb 14.4 oz (60.7 kg)     She was last seen for hypertension 6 months ago.  BP at that visit was 143/54. Management since that visit includes; on losartan. She reports good compliance with treatment. She is not having side effects. none She is exercising. She is adherent to low salt diet.   Outside blood pressures are 120/70.  She does not smoke.  Use of agents associated with hypertension: none.   -------------------------------------------------------------------- Depression, Follow-up  She  was last seen for this 6 months ago. Changes made at last visit include; no changes.   She reports good compliance with treatment. She is not having side effects. none  She reports good tolerance of treatment. Current symptoms include: depressed mood, difficulty concentrating, fatigue, hopelessness and insomnia She feels she is Worse since last visit.  Depression screen [facility] 2/9 11/29/2020 06/18/2020 03/20/2020  Decreased Interest 3 0 0  Down, Depressed, Hopeless 3 0 0  PHQ - 2 Score 6 0 0  Altered sleeping 2 1 -  Tired, decreased energy 2 0 -  Change in appetite 2 0 -  Feeling bad or failure about yourself  3 0 -  Trouble  concentrating 3 0 -  Moving slowly or fidgety/restless 2 0 -  Suicidal thoughts 0 0 -  PHQ-9 Score 20 1 -  Difficult doing work/chores Extremely dIfficult Not difficult at all -  Some recent data might be hidden    --------------------------------------------------------------------   Patient Active Problem List   Diagnosis Date Noted  . Erosive osteoarthritis of both hands 09/11/2020  . Primary osteoarthritis, left shoulder 09/11/2020  . History of revision of total replacement of right hip joint 08/15/2019  . Primary localized osteoarthritis of right hip 04/18/2019  . Primary osteoarthritis of right hip 04/18/2019  . Pectus excavatum 10/12/2017  . 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 Perimenopausal atrophic vaginitis 09/22/2016  . Menopausal hot flushes 09/22/2016  . Mitral valve prolapse 01/13/2016  . Herniation of nucleus pulposus 05/01/2015  . Hypercholesteremia 05/01/2015  . Osteoarthritis of both knees 05/01/2015  . History of migraine headaches 05/01/2015  . Esophageal reflux 01/10/2015  . S/P total hip arthroplasty 09/12/2013   Social History   Tobacco Use  . Smoking status: Never Smoker  . Smokeless tobacco: Never Used  Vaping Use  . Vaping Use: Never used  Substance Use Topics  . Alcohol use: No    Alcohol/week: 0.0 standard drinks  . Drug use: No   Allergies  Allergen Reactions  . Avelox [Moxifloxacin] Other (See Comments)    C.diff colitis   . Neurontin [Gabapentin] Diarrhea    abd cramping   . Nitrofurantoin Monohyd Macro Hives  . Percocet [Oxycodone-Acetaminophen] Hives  .  Pulmicort Turbuhaler [Budesonide] Other (See Comments)    Blisters in mouth   . Amoxicillin Rash    Did it involve swelling of the face/tongue/throat, SOB, or low BP? No Did it involve sudden or severe rash/hives, skin peeling, or any reaction on the inside of your mouth or nose? No Did you need to seek medical attention at a hospital or doctor's office? Unknown When did it  last happen?unknown If all above answers are "NO", may proceed with cephalosporin use.   . Bactrim [Sulfamethoxazole-Trimethoprim] Rash  . Cefdinir Rash  . Chlorhexidine Rash  . Ciprofloxacin Hcl Rash  . Clindamycin/Lincomycin Rash  . Doxycycline Rash  . Entex Hives and Rash  . Hydrocodone Rash  . Other Rash    Dial soap   . Tavist-D [Albertsons Dayhist-D] Rash  . Tizanidine Rash  . Witch Hazel Rash       Medications: Outpatient Medications Prior to Visit  Medication Sig  . acetaminophen (TYLENOL) 650 MG CR tablet Take 650 mg by mouth as needed for pain.  . Ammonium Lactate (AMLACTIN EX) Apply 1 application topically 4 (four) times daily.  . Calcium Carb-Cholecalciferol (CALCIUM 600+D) 600-800 MG-UNIT TABS Take 1 tablet by mouth 2 (two) times daily.  . cholecalciferol (VITAMIN D3) 25 MCG (1000 UNIT) tablet Take 2,000 Units by mouth daily.  . cycloSPORINE (RESTASIS) 0.05 % ophthalmic emulsion Place 1 drop into both eyes 2 (two) times daily.   Marland Kitchen estradiol (ESTRACE) 1 MG tablet Take 1 mg by mouth every other day.  . Glucosamine HCl 1000 MG TABS Take 1,000 mg by mouth daily.  Marland Kitchen losartan (COZAAR) 25 MG tablet TAKE ONE TABLET BY MOUTH EVERY DAY  . Omega-3 1000 MG CAPS Take 1,000 mg by mouth daily.   . pantoprazole (PROTONIX) 40 MG tablet Take 1 tablet (40 mg total) by mouth 2 (two) times daily.  Vladimir Faster Glycol-Propyl Glycol (SYSTANE OP) Place 1 drop into both eyes 2 (two) times daily as needed (dry eyes).   . sodium chloride (MURO 128) 5 % ophthalmic solution Place 1 drop into both eyes as needed (severe dry eyes).  . vitamin B-12 (CYANOCOBALAMIN) 1000 MCG tablet Take 1,000 mcg by mouth daily.  . vitamin E 1000 UNIT capsule Take 1,000 Units by mouth daily.  Marland Kitchen amLODipine (NORVASC) 2.5 MG tablet Take 1 tablet (2.5 mg total) by mouth daily. (Patient not taking: No sig reported)  . diphenhydrAMINE (BENADRYL) 25 mg capsule Take 25 mg by mouth at bedtime as needed. (Patient not  taking: No sig reported)   No facility-administered medications prior to visit.    Review of Systems  Last thyroid functions Lab Results  Component Value Date   TSH 2.800 12/13/2019        Objective    BP 120/71 (BP Location: Right Arm, Patient Position: Sitting, Cuff Size: Normal)   Pulse 69   Temp 97.9 F (36.6 C) (Oral)   Resp 16   Ht 5\' 6"  (1.676 m)   Wt 129 lb 6.4 oz (58.7 kg)   SpO2 98%   BMI 20.89 kg/m  BP Readings from Last 3 Encounters:  11/29/20 120/71  09/11/20 (!) 169/83  06/18/20 (!) 145/59   Wt Readings from Last 3 Encounters:  11/29/20 129 lb 6.4 oz (58.7 kg)  09/11/20 135 lb 12.8 oz (61.6 kg)  06/18/20 133 lb 14.4 oz (60.7 kg)      Physical Exam Constitutional:      General: She is not in acute distress.  Appearance: She is well-developed.  HENT:     Head: Normocephalic and atraumatic.     Right Ear: Hearing and tympanic membrane normal.     Left Ear: Hearing and tympanic membrane normal.     Ears:     Comments: Bilateral hearing aids.    Nose: Nose normal.  Eyes:     General: Lids are normal. No scleral icterus.       Right eye: No discharge.        Left eye: No discharge.     Conjunctiva/sclera: Conjunctivae normal.  Neck:     Vascular: No carotid bruit.  Cardiovascular:     Rate and Rhythm: Normal rate and regular rhythm.  Pulmonary:     Effort: Pulmonary effort is normal. No respiratory distress.  Abdominal:     General: Bowel sounds are normal.  Musculoskeletal:        General: Normal range of motion.     Cervical back: Neck supple.  Lymphadenopathy:     Cervical: No cervical adenopathy.  Skin:    Findings: No lesion or rash.  Neurological:     Mental Status: She is alert and oriented to person, place, and time.  Psychiatric:        Speech: Speech normal.        Behavior: Behavior normal.        Thought Content: Thought content normal.       No results found for any visits on 11/29/20.  Assessment & Plan     1.  Essential hypertension Well controlled on Losartan. Continue regular exercise and get routine labs. - CBC with Differential/Platelet - Comprehensive metabolic panel - TSH  2. Depression, major, single episode, moderate ([place]) Having frequent crying spells spontaneously. Similar to past depressive episodes. Worse trying to care for husband who has dementia. Moving to a new home close to her children. Will give Fluoxetine she had used in the past with good success. Check routine labs for metabolic disorders. Recheck pending reports. - FLUoxetine (PROZAC) 20 MG tablet; Take 1 tablet (20 mg total) by mouth daily.  Dispense: 30 tablet; Refill: 3 - CBC with Differential/Platelet - Comprehensive metabolic panel - TSH   No follow-ups on file.      I, Alia Parsley, PA-C, have reviewed all documentation for this visit. The documentation on 11/29/20 for the exam, diagnosis, procedures, and orders are all accurate and complete.    Vernie Murders, PA-C  [employer] [phone] (phone) [phone] (fax)  [place]

## 2020-11-29 ENCOUNTER — Ambulatory Visit (INDEPENDENT_AMBULATORY_CARE_PROVIDER_SITE_OTHER): Payer: PPO | Admitting: Family Medicine

## 2020-11-29 ENCOUNTER — Encounter: Payer: Self-pay | Admitting: Family Medicine

## 2020-11-29 ENCOUNTER — Other Ambulatory Visit: Payer: Self-pay

## 2020-11-29 VITALS — BP 120/71 | HR 69 | Temp 97.9°F | Resp 16 | Ht 66.0 in | Wt 129.4 lb

## 2020-11-29 DIAGNOSIS — F321 Major depressive disorder, single episode, moderate: Secondary | ICD-10-CM

## 2020-11-29 DIAGNOSIS — I10 Essential (primary) hypertension: Secondary | ICD-10-CM

## 2020-11-29 MED ORDER — FLUOXETINE HCL 20 MG PO TABS: 20.0000 mg | ORAL_TABLET | Freq: Every day | ORAL | 3 refills | Status: AC

## 2020-12-02 DIAGNOSIS — I10 Essential (primary) hypertension: Secondary | ICD-10-CM | POA: Diagnosis not present

## 2020-12-02 DIAGNOSIS — F321 Major depressive disorder, single episode, moderate: Secondary | ICD-10-CM | POA: Diagnosis not present

## 2020-12-03 ENCOUNTER — Encounter: Payer: Self-pay | Admitting: Family Medicine

## 2020-12-03 LAB — COMPREHENSIVE METABOLIC PANEL
ALT: 12 IU/L (ref 0–32)
AST: 17 IU/L (ref 0–40)
Albumin/Globulin Ratio: 1.9 (ref 1.2–2.2)
Albumin: 4.4 g/dL (ref 3.6–4.6)
Alkaline Phosphatase: 45 IU/L (ref 44–121)
BUN/Creatinine Ratio: 15 (ref 12–28)
BUN: 14 mg/dL (ref 8–27)
Bilirubin Total: 0.4 mg/dL (ref 0.0–1.2)
CO2: 23 mmol/L (ref 20–29)
Calcium: 9.6 mg/dL (ref 8.7–10.3)
Chloride: 93 mmol/L — ABNORMAL LOW (ref 96–106)
Creatinine, Ser: 0.91 mg/dL (ref 0.57–1.00)
Globulin, Total: 2.3 g/dL (ref 1.5–4.5)
Glucose: 82 mg/dL (ref 65–99)
Potassium: 4.6 mmol/L (ref 3.5–5.2)
Sodium: 128 mmol/L — ABNORMAL LOW (ref 134–144)
Total Protein: 6.7 g/dL (ref 6.0–8.5)
eGFR: 63 mL/min/{1.73_m2} (ref >59)

## 2020-12-03 LAB — CBC WITH DIFFERENTIAL/PLATELET
Basophils Absolute: 0.1 10*3/uL (ref 0.0–0.2)
Basos: 1 %
EOS (ABSOLUTE): 0.1 10*3/uL (ref 0.0–0.4)
Eos: 2 %
Hematocrit: 39.3 % (ref 34.0–46.6)
Hemoglobin: 12.9 g/dL (ref 11.1–15.9)
Immature Grans (Abs): 0 10*3/uL (ref 0.0–0.1)
Immature Granulocytes: 0 %
Lymphocytes Absolute: 1.4 10*3/uL (ref 0.7–3.1)
Lymphs: 21 %
MCH: 28.4 pg (ref 26.6–33.0)
MCHC: 32.8 g/dL (ref 31.5–35.7)
MCV: 86 fL (ref 79–97)
Monocytes Absolute: 0.4 10*3/uL (ref 0.1–0.9)
Monocytes: 6 %
Neutrophils Absolute: 4.6 10*3/uL (ref 1.4–7.0)
Neutrophils: 70 %
Platelets: 258 10*3/uL (ref 150–450)
RBC: 4.55 x10E6/uL (ref 3.77–5.28)
RDW: 13.1 % (ref 11.7–15.4)
WBC: 6.5 10*3/uL (ref 3.4–10.8)

## 2020-12-03 LAB — TSH: TSH: 2.52 u[IU]/mL (ref 0.450–4.500)

## 2020-12-05 ENCOUNTER — Encounter: Payer: Self-pay | Admitting: Physical Therapy

## 2020-12-05 ENCOUNTER — Ambulatory Visit: Payer: PPO | Attending: Physician Assistant | Admitting: Physical Therapy

## 2020-12-05 ENCOUNTER — Other Ambulatory Visit: Payer: Self-pay

## 2020-12-05 DIAGNOSIS — R2689 Other abnormalities of gait and mobility: Secondary | ICD-10-CM | POA: Insufficient documentation

## 2020-12-05 DIAGNOSIS — M25612 Stiffness of left shoulder, not elsewhere classified: Secondary | ICD-10-CM | POA: Insufficient documentation

## 2020-12-05 NOTE — Therapy (Signed)
[facility] [address], [place], [address] Phone: [phone]   Fax:  [phone]  Physical Therapy Re-Evaluation  Patient Details  Name: Wendy Patton MRN: [id number] Date of Birth: 05-04-1936 No data recorded  Encounter Date: 12/05/2020   PT End of Session - 12/05/20 1036    Visit Number 3    Number of Visits 17    Date for PT Re-Evaluation 12/06/20    Authorization - Visit Number 3    Authorization - Number of Visits 10    PT Start Time [phone]    PT Stop Time 1027    PT Time Calculation (min) 39 min    Activity Tolerance Patient tolerated treatment well    Behavior During Therapy [facility] for tasks assessed/performed           Past Medical History:  Diagnosis Date  . Anemia    after jaw surgery was on iron for short period of time  . Arthritis   . Cancer (HCC)    squamous cell  . Difficult intubation    potential for difficult airway due to limited mouth opening  . Dry skin   . GERD (gastroesophageal reflux disease)    takes Aciphex daily  . Headache(784.0)    hx of migraines   . Heart murmur   . History of migraine    last one Aug 2014 and takes Imitrex daily as needed  . History of staph infection   . IBS (irritable bowel syndrome)   . Joint pain   . Joint swelling   . Laryngopharyngeal reflux   . MVP (mitral valve prolapse)   . Neuralgia   . Neuropathy    takes gabapentin daily  . PONV (postoperative nausea and vomiting)    TMJ surgery several times  . Rheumatic fever    as a child  . Shortness of breath    with exertion  . Thyroid disease     Past Surgical History:  Procedure Laterality Date  . ABDOMINAL HYSTERECTOMY  2009  . BREAST DUCTAL SYSTEM EXCISION Right 01/11/2014   Procedure: MAJOR DUCT EXCISION RIGHT BREAST;  Surgeon: Stark Klein, MD;  Location: [facility];  Service: General;  Laterality: Right;  . BREAST EXCISIONAL BIOPSY Right 2015   neg  . BREAST EXCISIONAL  BIOPSY Left 2010   benign papilloma removed  . BREAST EXCISIONAL BIOPSY Left 1975   neg  . BREAST SURGERY  1975, 2010   benign breast tumor  . breat excision  1975   benign breast tumor excision  . CATARACT EXTRACTION W/PHACO Right 07/15/2016   Procedure: CATARACT EXTRACTION PHACO AND INTRAOCULAR LENS PLACEMENT (IOC);  Surgeon: Estill Cotta, MD;  Location: [facility] OR[facility];  Service: Ophthalmology;  Laterality: Right;  Lot # [id number] H [place]: 01:38.6 AP%: 25.0 CDE: 46.89  . CATARACT EXTRACTION W/PHACO Left 09/02/2016   Procedure: CATARACT EXTRACTION PHACO AND INTRAOCULAR LENS PLACEMENT (IOC);  Surgeon: Estill Cotta, MD;  Location: [facility] ORS;  Service: Ophthalmology;  Laterality: Left;  [place] 2:05.3 AP% 24.9 CDE 51.84 Fluid pack lot # [id number]  . cervial polyps  1999  . [place]  . COLONOSCOPY    . CYSTECTOMY  1962   vaginal cyst removal  . DILATION AND CURETTAGE OF UTERUS  1984  . ESOPHAGOGASTRODUODENOSCOPY    . granuloma  2002   chin granuloma removed  . [place], 2002, 2004   prosthesis, condyles implants, fossa replaced  .  REVISION TOTAL KNEE ARTHROPLASTY    . SIGMOIDOSCOPY  1993  . SQUAMOUS CELL CARCINOMA EXCISION  2008   left ankle/skin graft  . TEMPOROMANDIBULAR JOINT SURGERY  1984  . THYROIDECTOMY  2006   left  . TONSILLECTOMY AND ADENOIDECTOMY  1944  . TOTAL HIP ARTHROPLASTY Left 09/12/2013   Procedure: LEFT TOTAL HIP ARTHROPLASTY ANTERIOR APPROACH;  Surgeon: Velna Ochs, MD;  Location: [facility];  Service: Orthopedics;  Laterality: Left;  . TOTAL HIP ARTHROPLASTY Right 04/18/2019   Procedure: RIGHT TOTAL HIP ARTHROPLASTY ANTERIOR APPROACH;  Surgeon: Marcene Corning, MD;  Location: [facility];  Service: Orthopedics;  Laterality: Right;  . TOTAL HIP ARTHROPLASTY Right 08/15/2019   Procedure: TOTAL HIP ARTHROPLASTY ANTERIOR APPROACH;  Surgeon: Marcene Corning, MD;  Location: [facility];  Service: Orthopedics;  Laterality: Right;  .  TRAPEZIUM RESECTION  2000, 2004   removal right & left hand trapezium  . TUBAL LIGATION    . uterine polyps  2008  . vaginal cyst removed  1962    There were no vitals filed for this visit.   Subjective Assessment - 12/05/20 0948    Subjective Patient report no pain currently until when she moves it to end range she reports her pain to be about 2/10. Patient reports the pain does wake her up at night but if she changes positions she is able to go back to sleep. Patient reports she has been unable to do her exercises due to busy schedule and caretaking for her husband but she reports she is moving better. She reports she has been lifting and moving heavy boxes to prepare to move and she notices pain when she overdoes it.    Pertinent History Pt is a 85 year old female familiar with this clinic, d/c for L knee pain May 2021. Reports she has had L shoulder pain over the past couple years, but it has worsened over the past couple months. Has had multiple cortisone shots in her L shoulder, last one was 2 months ago with pain relief for a few weeks.Pain is sharp in nature, at posterior shoulder at lateral scapula border. Does not radiate to arm or neck, denies numbness tingling/pins and needles. Worst pain over past week 8/10; best 1/10. Pain with reaching overhead, across the body, and behind the back. Pain with any overhead lifting or activities, and has pain following pushing/pulling activities such as mopping, pushing WC for husband (occassionally), vacuming; and following sleeping on her R side when her L shoulder is IR'd.  Pt is retired, enjoys doing yoga and kniting. She lives at home with husband, drive, complete all community errands, ind with all ADLs. Pt denies N/V, B&B changes, unexplained weight fluctuation, saddle paresthesia, fever, night sweats, or unrelenting night pain at this time. No falls in past 6 months.    Limitations Lifting;Walking;House hold activities    How long can you sit  comfortably? unlimited    How long can you stand comfortably? unlimited    How long can you walk comfortably? unlimited    Diagnostic tests Xrays last year reports only OA seen    Patient Stated Goals Reduce pain    Currently in Pain? No/denies    Pain Score 0-No pain    Pain Location Shoulder    Pain Orientation Right;Anterior    Pain Descriptors / Indicators Sharp    Pain Type Chronic pain    Pain Onset More than a month ago    Pain Frequency Intermittent    Aggravating  Factors  pushing, pulling, overhead, lifting/reaching, reaching across body or behind the back    Pain Relieving Factors ice, temporarily cortisone, tylenol          Dominant hand: Right  OBJECTIVE  MUSCULOSKELETAL: Tremor: Normal Bulk: Normal Tone: Normal  Cervical Screen AROM: WFL and painless with overpressure in all planes; minimal limitation in rotation Rotation R 70 L 63 Spurlings A (ipsilateral lateral flexion/axial compression): Negative Spurlings B (ipsilateral lateral flexion/contralateral rotation/axial compression): Negative Repeated movement: No centralization or peripheralization with protraction or retraction  Elbow Screen Elbow AROM: WNL bilat  Palpation TTP with withdrawal and grimace to palpation at posterior GHJ, secondary TTP at superior humeral head (supraspinatus insertion)  Strength R/L 5/4- Shoulder flexion (anterior deltoid/pec major/coracobrachialis, axillary n. (C5-6) and musculocutaneous n. (C5-7)) 5/3-  Shoulder abduction (deltoid/supraspinatus, axillary/suprascapular n, C5) 5/4- Shoulder external rotation (infraspinatus/teres minor) 5/4- Shoulder internal rotation (subcapularis/lats/pec major) 5/5 Shoulder extension (posterior deltoid, lats, teres major, axillary/thoracodorsal n.) 5/5 Shoulder horizontal abduction 5/5 Elbow flexion (biceps brachii, brachialis, brachioradialis, musculoskeletal n, C5-6) 5/5 Elbow extension (triceps, radial n, C7) 4-/2- Y lower trap  - painful on the LUE 5/3- T scapular retractors - painful on the LUE   AROM R/L 180/135 Shoulder flexion 180/128* Shoulder abduction C8/C7 Shoulder external rotation T10/T10* Shoulder internal rotation 60/60 Shoulder extension *Indicates pain, overpressure performed unless otherwise indicated  PROM R/L 180/155 Shoulder flexion 180/110* Shoulder abduction 90/50* Shoulder external rotation 70/90 Shoulder internal rotation 60/60 Shoulder extension *Indicates pain, overpressure performed unless otherwise indicated    NEUROLOGICAL:  Mental Status Patient is oriented to person, place and time.  Recent memory is intact.  Remote memory is intact.  Attention span and concentration are intact.  Expressive speech is intact.  Patient's fund of knowledge is within normal limits for educational level.  Cranial Nerves Visual acuity and visual fields are intact  Extraocular muscles are intact  Facial sensation is intact bilaterally  Facial strength is intact bilaterally  Hearing is normal as tested by gross conversation Palate elevates midline, normal phonation  Shoulder shrug strength is intact  Tongue protrudes midline   Sensation Grossly intact to light touch bilateral UE as determined by testing dermatomes C2-T2 Proprioception and hot/cold testing deferred on this date   SPECIAL TESTS  Rotator Cuff  Drop Arm Test: Negative  Painful Arc (Pain from 60 to 120 degrees scaption): Positive around 90 degrees  Infraspinatus Muscle Test: Negative  Subacromial Impingement Hawkins-Kennedy: Negative Neer (Block scapula, PROM flexion): Positive  Painful Arc (Pain from 60 to 120 degrees scaption): Positive around 90 degrees  Empty Can: Negative External Rotation Resistance: Negative Horizontal Adduction: Postive Scapular Assist:  Negative  Labral Tear Biceps Load II (120 elevation, full ER, 90 elbow flexion, full supination, resisted elbow flexion): Negative  Crank  (160 scaption, axial load with IR/ER):positive Active Compression Test: Negative  Bicep Tendon Pathology Speed (shoulder flexion to 90, external rotation, full elbow extension, and forearm supination with resistance:Negative  Yergason's (resisted shoulder ER and supination/biceps tendon pathology): Negative  Shoulder Instability Sulcus Sign: Negative Anterior Apprehension: Negative  Ther-Ex PT reviewed the following HEP with patient with patient able to demonstrate a set of the following with min cuing for correction needed. PT educated patient on parameters of therex (how/when to inc/decrease intensity, frequency, rep/set range, stretch hold time, and purpose of therex) with verbalized understanding.  Access Code: [email or web address] Seated Shoulder Flexion AAROM with Pulley Behind - 2-3 x daily - 7 x weekly - 15 reps - 3-5sec hold Seated Shoulder  Abduction AAROM with Pulley Behind - 2-3 x daily - 7 x weekly - 15 reps - 3-5sec hold Standing Shoulder Internal Rotation Stretch with Towel - 2-3 x daily - 7 x weekly - 15 reps - 3-5sec hold Seated Scapular Retraction - 8 x daily - 7 x weekly - 12 reps - 3-5sec hold Standing ER with RTB anchored in door 3x10, 2-3x/week  Standing IR with RTB anchored in door 3x10, 2-3x/week      PT Education - 12/05/20 1035    Education Details HEP reviewed    Person(s) Educated Patient    Methods Explanation;Demonstration;Verbal cues    Comprehension Verbalized understanding;Returned demonstration;Verbal cues required            PT Short Term Goals - 12/06/20 0953      PT SHORT TERM GOAL #1   Title Pt will be independent with HEP in order to improve strength and decrease pain in order to improve pain-free function at home and work.    Baseline 10/08/20 HEP given; 12/05/20 HEP update    Time 4    Period Weeks    Status Revised             PT Long Term Goals - 12/06/20 0954      PT LONG TERM GOAL #1   Title Patient will increase FOTO score to 64 to  demonstrate predicted increase in functional mobility to complete ADLs    Baseline 10/08/20 51    Time 8    Period Weeks    Status Revised      PT LONG TERM GOAL #2   Title Pt will decrease worst pain as reported on NPRS by at least 3 points in order to demonstrate clinically significant reduction in pain.    Baseline 10/09/19 8/10; 12/06/20 6/10    Time 8    Period Weeks    Status Revised      PT LONG TERM GOAL #3   Title Patient will demonstrate at least 4/5 shoulder and periscapular strength in order to complete heavy household ADLs    Baseline 10/08/20 shoulder flex: 2+; abd 2+; ER 4- ; IR 3+ ; Y 2- ; T 3-; 12/06/20 shoulder flex: 4-; abd 3-; ER 4- ; IR 4- ; Y 2- ; T 3-    Time 8    Period Weeks    Status Revised      PT LONG TERM GOAL #4   Title Patient will demonstrate full, pain free active shoulder ROM in order to complete overhead reaching for ADLs, and ind bathing and dressing    Baseline 10/08/20 flex: 114; abd 93; IR T12 with pain; 12/06/20 flex: 135; abd 128; IR T10 with pain    Time 8    Period Weeks    Status Revised                 Plan - 12/05/20 1048    Clinical Impression Statement Patient returns to PT after several weeks due to health complications with her husband. Patient continues to present with  L shoulder pain consistent with OA and subacromial impingement.  PT reassessed patient's shoulder ROM and strength in her LUE with MMT's and concluded that the patient is demonstrating increased mobility but continues to have ROM and strength deficits in her LUE and is limited by pain. 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 PT  will continue with POC to improve LUE mobility and strength.    Personal Factors and Comorbidities Comorbidity 1;Comorbidity 2;Comorbidity 3+;Age;Fitness;Past/Current Experience;Sex    Comorbidities bilat  neuropathy, arthritis, anemia, history of cancer    Examination-Activity Limitations Lift;Reach Overhead;Carry;Bathing;Dressing;Hygiene/Grooming;Sleep    Examination-Participation Restrictions Meal Prep;Community Activity;Cleaning;Laundry;Yard Work;Driving    Stability/Clinical Decision Making Evolving/Moderate complexity    Clinical Decision Making Moderate    Rehab Potential Good    PT Frequency 2x / week    PT Duration 8 weeks    PT Treatment/Interventions ADLs/Self Care Home Management;Cryotherapy;Ultrasound;Stair training;Balance training;Dry needling;Moist Heat;Traction;Gait training;Therapeutic exercise;Patient/family education;Manual techniques;Passive range of motion;Electrical Stimulation;Functional mobility training;Therapeutic activities;Neuromuscular re-education;Energy conservation;Joint Manipulations;Spinal Manipulations    PT Next Visit Plan HEP review    PT Home Exercise Plan pulleys abd/flex, towel IR AAROM, scapular retractions, standing ER with RTB anchored, standing IR with RTB anchored    Consulted and Agree with Plan of Care Patient           Patient will benefit from skilled therapeutic intervention in order to improve the following deficits and impairments:  Decreased endurance,Decreased mobility,Decreased range of motion,Improper body mechanics,Pain,Postural dysfunction,Impaired flexibility,Increased fascial restricitons,Decreased strength,Decreased coordination,Decreased activity tolerance,Increased muscle spasms  Visit Diagnosis: Stiffness of left shoulder, not elsewhere classified     Problem List Patient Active Problem List   Diagnosis Date Noted  . Erosive osteoarthritis of both hands 09/11/2020  . Primary osteoarthritis, left shoulder 09/11/2020  . History of revision of total replacement of right hip joint 08/15/2019  . Primary localized osteoarthritis of right hip 04/18/2019  . Primary osteoarthritis of right hip 04/18/2019  . Pectus excavatum  10/12/2017  . 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 Perimenopausal atrophic vaginitis 09/22/2016  . Menopausal hot flushes 09/22/2016  . Mitral valve prolapse 01/13/2016  . Herniation of nucleus pulposus 05/01/2015  . Hypercholesteremia 05/01/2015  . Osteoarthritis of both knees 05/01/2015  . History of migraine headaches 05/01/2015  . Esophageal reflux 01/10/2015  . S/P total hip arthroplasty 09/12/2013     Durwin Reges DPT [address] [place]PT  Durwin Reges 12/06/2020, 10:00 AM  [facility] [address] [place], [address] Phone: [phone]   Fax:  [phone]  Name: Wendy Patton MRN: [id number] Date of Birth: Mar 17, 1936

## 2020-12-10 ENCOUNTER — Other Ambulatory Visit: Payer: Self-pay

## 2020-12-10 ENCOUNTER — Encounter: Payer: Self-pay | Admitting: Physical Therapy

## 2020-12-10 ENCOUNTER — Ambulatory Visit: Payer: PPO | Admitting: Physical Therapy

## 2020-12-10 DIAGNOSIS — M25612 Stiffness of left shoulder, not elsewhere classified: Secondary | ICD-10-CM | POA: Diagnosis not present

## 2020-12-10 DIAGNOSIS — R2689 Other abnormalities of gait and mobility: Secondary | ICD-10-CM

## 2020-12-10 NOTE — Therapy (Signed)
[facility] [address] [place], [address] Phone: [phone]   Fax:  [phone]  Physical Therapy Treatment  Patient Details  Name: Wendy Patton MRN: [id number] Date of Birth: [birth year] No data recorded  Encounter Date: 12/10/2020   PT End of Session - 12/10/20 0954    Visit Number 4    Number of Visits 17    Date for PT Re-Evaluation 12/06/20    Authorization - Visit Number 4    Authorization - Number of Visits 10    PT Start Time 0945    PT Stop Time 1025    PT Time Calculation (min) 40 min    Activity Tolerance Patient tolerated treatment well    Behavior During Therapy [facility] for tasks assessed/performed           Past Medical History:  Diagnosis Date  . Anemia    after jaw surgery was on iron for short period of time  . Arthritis   . Cancer (HCC)    squamous cell  . Difficult intubation    potential for difficult airway due to limited mouth opening  . Dry skin   . GERD (gastroesophageal reflux disease)    takes Aciphex daily  . Headache(784.0)    hx of migraines   . Heart murmur   . History of migraine    last one Aug 2014 and takes Imitrex daily as needed  . History of staph infection   . IBS (irritable bowel syndrome)   . Joint pain   . Joint swelling   . Laryngopharyngeal reflux   . MVP (mitral valve prolapse)   . Neuralgia   . Neuropathy    takes gabapentin daily  . PONV (postoperative nausea and vomiting)    TMJ surgery several times  . Rheumatic fever    as a child  . Shortness of breath    with exertion  . Thyroid disease     Past Surgical History:  Procedure Laterality Date  . ABDOMINAL HYSTERECTOMY  2009  . BREAST DUCTAL SYSTEM EXCISION Right 01/11/2014   Procedure: MAJOR DUCT EXCISION RIGHT BREAST;  Surgeon: Stark Klein, MD;  Location: [facility];  Service: General;  Laterality: Right;  . BREAST EXCISIONAL BIOPSY Right 2015   neg  . BREAST EXCISIONAL BIOPSY  Left 2010   benign papilloma removed  . BREAST EXCISIONAL BIOPSY Left 1975   neg  . BREAST SURGERY  1975, 2010   benign breast tumor  . breat excision  1975   benign breast tumor excision  . CATARACT EXTRACTION W/PHACO Right 07/15/2016   Procedure: CATARACT EXTRACTION PHACO AND INTRAOCULAR LENS PLACEMENT (IOC);  Surgeon: Estill Cotta, MD;  Location: [facility];  Service: Ophthalmology;  Laterality: Right;  Lot # [id number] H [place]: 01:38.6 AP%: 25.0 CDE: 46.89  . CATARACT EXTRACTION W/PHACO Left 09/02/2016   Procedure: CATARACT EXTRACTION PHACO AND INTRAOCULAR LENS PLACEMENT (IOC);  Surgeon: Estill Cotta, MD;  Location: [facility];  Service: Ophthalmology;  Laterality: Left;  [place] 2:05.3 AP% 24.9 CDE 51.84 Fluid pack lot # [id number]  . cervial polyps  1999  . [place]  . COLONOSCOPY    . CYSTECTOMY  1962   vaginal cyst removal  . DILATION AND CURETTAGE OF UTERUS  1984  . ESOPHAGOGASTRODUODENOSCOPY    . granuloma  2002   chin granuloma removed  . [place], 2002, 2004   prosthesis, condyles implants, fossa replaced  .  REVISION TOTAL KNEE ARTHROPLASTY    . SIGMOIDOSCOPY  1993  . SQUAMOUS CELL CARCINOMA EXCISION  2008   left ankle/skin graft  . [place]  . THYROIDECTOMY  2006   left  . TONSILLECTOMY AND ADENOIDECTOMY  1944  . TOTAL HIP ARTHROPLASTY Left 09/12/2013   Procedure: LEFT TOTAL HIP ARTHROPLASTY ANTERIOR APPROACH;  Surgeon: [name], MD;  Location: [place];  Service: Orthopedics;  Laterality: Left;  . TOTAL HIP ARTHROPLASTY Right 04/18/2019   Procedure: RIGHT TOTAL HIP ARTHROPLASTY ANTERIOR APPROACH;  Surgeon: Melrose Nakayama, MD;  [facility];  Service: Orthopedics;  Laterality: Right;  . TOTAL HIP ARTHROPLASTY Right 08/15/2019   Procedure: TOTAL HIP ARTHROPLASTY ANTERIOR APPROACH;  Surgeon: Melrose Nakayama, MD;  [facility];  Service: Orthopedics;  Laterality: Right;  .  TRAPEZIUM RESECTION  2000, 2004   removal right & left hand trapezium  . TUBAL LIGATION    . uterine polyps  2008  . vaginal cyst removed  1962    There were no vitals filed for this visit.   Subjective Assessment - 12/10/20 0951    Subjective Patient reports 3/10 pain this am. Reports pain is less severe overall, and does not wake her up in the night anymore. She is completing HEP with no questions or concerns.    Pertinent History Pt is a 85 year old female familiar with this clinic, d/c for L knee pain May 2021. Reports she has had L shoulder pain over the past couple years, but it has worsened over the past couple months. Has had multiple cortisone shots in her L shoulder, last one was 2 months ago with pain relief for a few weeks.Pain is sharp in nature, at posterior shoulder at lateral scapula border. Does not radiate to arm or neck, denies numbness tingling/pins and needles. Worst pain over past week 8/10; best 1/10. Pain with reaching overhead, across the body, and behind the back. Pain with any overhead lifting or activities, and has pain following pushing/pulling activities such as mopping, pushing WC for husband (occassionally), vacuming; and following sleeping on her R side when her L shoulder is IR'd.  Pt is retired, enjoys doing yoga and kniting. She lives at home with husband, drive, complete all community errands, ind with all ADLs. Pt denies N/V, B&B changes, unexplained weight fluctuation, saddle paresthesia, fever, night sweats, or unrelenting night pain at this time. No falls in past 6 months.    Limitations Lifting;Walking;House hold activities    How long can you sit comfortably? unlimited    How long can you stand comfortably? unlimited    How long can you walk comfortably? unlimited    Diagnostic tests Xrays last year reports only OA seen    Pain Onset More than a month ago               Ther-Ex  Seated Shoulder Flexion AAROM with Pulley x15 3sec hold Seated  Shoulder Abduction AAROM with Pulley x15 3sec hold Standing Shoulder Internal Rotation Stretch with Towel x12 2-3sec Seated Scapular Retraction - 8 x daily - 7 x weekly - 12 reps - 3-5sec hold Standing ER and IR with RTB 2x 10  With min cuing for eccentric control and towel at elbow with good carry over Supine aarom flex with dowel x12 with cuing for scapulohumeral rhythm with good carry over with TC of mat Supine dowel BUE flex with 5# AW 3x 10 with cuing for set up without thoracic ext Attempted  band pull aparts RTB unable without pain Standing Y with Rtband x8; without band x8 with less pain but still painful;  with back against wall for TC of scapular motion with good carry over Seated rows RTB 2x 10 with cuing for scapular motion with good carry over                   PT Education - 12/10/20 0953    Education Details therex form/technique,HEP review    Person(s) Educated Patient    Methods Explanation;Demonstration;Verbal cues    Comprehension Verbalized understanding;Returned demonstration;Verbal cues required            PT Short Term Goals - 12/06/20 0953      PT SHORT TERM GOAL #1   Title Pt will be independent with HEP in order to improve strength and decrease pain in order to improve pain-free function at home and work.    Baseline 10/08/20 HEP given; 12/05/20 HEP update    Time 4    Period Weeks    Status Revised             PT Long Term Goals - 12/06/20 0954      PT LONG TERM GOAL #1   Title Patient will increase FOTO score to 64 to demonstrate predicted increase in functional mobility to complete ADLs    Baseline 10/08/20 51    Time 8    Period Weeks    Status Revised      PT LONG TERM GOAL #2   Title Pt will decrease worst pain as reported on NPRS by at least 3 points in order to demonstrate clinically significant reduction in pain.    Baseline 10/09/19 8/10; 12/06/20 6/10    Time 8    Period Weeks    Status Revised      PT LONG TERM GOAL #3    Title Patient will demonstrate at least 4/5 shoulder and periscapular strength in order to complete heavy household ADLs    Baseline 10/08/20 shoulder flex: 2+; abd 2+; ER 4- ; IR 3+ ; Y 2- ; T 3-; 12/06/20 shoulder flex: 4-; abd 3-; ER 4- ; IR 4- ; Y 2- ; T 3-    Time 8    Period Weeks    Status Revised      PT LONG TERM GOAL #4   Title Patient will demonstrate full, pain free active shoulder ROM in order to complete overhead reaching for ADLs, and ind bathing and dressing    Baseline 10/08/20 flex: 114; abd 93; IR T12 with pain; 12/06/20 flex: 135; abd 128; IR T10 with pain    Time 8    Period Weeks    Status Revised                 Plan - 12/10/20 1011    Clinical Impression Statement PT continued therex progression for increased shoulder mobility, scapulohumeral rhythm, and shoulder/periscapular strength with success. Patient with diffiuclty with final degrees of overhead flexion, with good mobility of abd/ER/IR this session. Patient is able to comply with all cuing for proper technique of all therex with eventual good carry over of scapulohumeral rhythm. Patient with some pain following overhead exercises, modified for this. PT will continue progression as able.    Personal Factors and Comorbidities Comorbidity 1;Comorbidity 2;Comorbidity 3+;Age;Fitness;Past/Current Experience;Sex    Comorbidities bilat neuropathy, arthritis, anemia, history of cancer    Examination-Activity Limitations Lift;Reach Overhead;Carry;Bathing;Dressing;Hygiene/Grooming;Sleep    Examination-Participation Restrictions Meal Prep;Community Activity;Cleaning;Laundry;Yard Work;Driving  Stability/Clinical Decision Making Evolving/Moderate complexity    Clinical Decision Making Moderate    Rehab Potential Good    PT Frequency 2x / week    PT Duration 8 weeks    PT Treatment/Interventions ADLs/Self Care Home Management;Cryotherapy;Ultrasound;Stair training;Balance training;Dry needling;Moist Heat;Traction;Gait  training;Therapeutic exercise;Patient/family education;Manual techniques;Passive range of motion;Electrical Stimulation;Functional mobility training;Therapeutic activities;Neuromuscular re-education;Energy conservation;Joint Manipulations;Spinal Manipulations    PT Next Visit Plan HEP review    PT Home Exercise Plan pulleys abd/flex, towel IR AAROM, scapular retractions, standing ER with RTB anchored, standing IR with RTB anchored    Consulted and Agree with Plan of Care Patient           Patient will benefit from skilled therapeutic intervention in order to improve the following deficits and impairments:  Decreased endurance,Decreased mobility,Decreased range of motion,Improper body mechanics,Pain,Postural dysfunction,Impaired flexibility,Increased fascial restricitons,Decreased strength,Decreased coordination,Decreased activity tolerance,Increased muscle spasms  Visit Diagnosis: Stiffness of left shoulder, not elsewhere classified  Other abnormalities of gait and mobility     Problem List Patient Active Problem List   Diagnosis Date Noted  . Erosive osteoarthritis of both hands 09/11/2020  . Primary osteoarthritis, left shoulder 09/11/2020  . History of revision of total replacement of right hip joint 08/15/2019  . Primary localized osteoarthritis of right hip 04/18/2019  . Primary osteoarthritis of right hip 04/18/2019  . Pectus excavatum 10/12/2017  . 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 Perimenopausal atrophic vaginitis 09/22/2016  . Menopausal hot flushes 09/22/2016  . Mitral valve prolapse 01/13/2016  . Herniation of nucleus pulposus 05/01/2015  . Hypercholesteremia 05/01/2015  . Osteoarthritis of both knees 05/01/2015  . History of migraine headaches 05/01/2015  . Esophageal reflux 01/10/2015  . S/P total hip arthroplasty 09/12/2013   Durwin Reges DPT Durwin Reges 12/10/2020, 10:30 AM  [place] [address], [place], [address] Phone: [phone]   Fax:  [phone]  Name: KALEB LINQUIST MRN: [id number] Date of Birth: [birth year]

## 2020-12-12 ENCOUNTER — Encounter: Payer: PPO | Admitting: Physical Therapy

## 2020-12-13 ENCOUNTER — Encounter: Payer: Self-pay | Admitting: Physician Assistant

## 2020-12-17 ENCOUNTER — Encounter: Payer: Self-pay | Admitting: Physical Therapy

## 2020-12-17 ENCOUNTER — Ambulatory Visit: Payer: PPO | Admitting: Physical Therapy

## 2020-12-17 ENCOUNTER — Other Ambulatory Visit: Payer: Self-pay

## 2020-12-17 ENCOUNTER — Ambulatory Visit: Payer: Self-pay | Admitting: Family Medicine

## 2020-12-17 DIAGNOSIS — M25612 Stiffness of left shoulder, not elsewhere classified: Secondary | ICD-10-CM | POA: Diagnosis not present

## 2020-12-17 DIAGNOSIS — R2689 Other abnormalities of gait and mobility: Secondary | ICD-10-CM

## 2020-12-17 NOTE — Therapy (Signed)
[place] [facility] [address] [place], [address] Phone: [phone]   Fax:  [phone]  Physical Therapy Treatment  Patient Details  Name: Wendy Patton MRN: [id number] Date of Birth: [birth year] No data recorded  Encounter Date: 12/17/2020   PT End of Session - 12/17/20 1402    Visit Number 5    Number of Visits 17    Authorization - Visit Number 5    Authorization - Number of Visits 10    PT Start Time 5573    PT Stop Time 1425    PT Time Calculation (min) 40 min    Activity Tolerance Patient tolerated treatment well    Behavior During Therapy [facility] for tasks assessed/performed           Past Medical History:  Diagnosis Date  . Anemia    after jaw surgery was on iron for short period of time  . Arthritis   . Cancer (HCC)    squamous cell  . Difficult intubation    potential for difficult airway due to limited mouth opening  . Dry skin   . GERD (gastroesophageal reflux disease)    takes Aciphex daily  . Headache(784.0)    hx of migraines   . Heart murmur   . History of migraine    last one Aug 2014 and takes Imitrex daily as needed  . History of staph infection   . IBS (irritable bowel syndrome)   . Joint pain   . Joint swelling   . Laryngopharyngeal reflux   . MVP (mitral valve prolapse)   . Neuralgia   . Neuropathy    takes gabapentin daily  . PONV (postoperative nausea and vomiting)    TMJ surgery several times  . Rheumatic fever    as a child  . Shortness of breath    with exertion  . Thyroid disease     Past Surgical History:  Procedure Laterality Date  . ABDOMINAL HYSTERECTOMY  2009  . BREAST DUCTAL SYSTEM EXCISION Right 01/11/2014   Procedure: MAJOR DUCT EXCISION RIGHT BREAST;  Surgeon: Stark Klein, MD;  [facility];  Service: General;  Laterality: Right;  . BREAST EXCISIONAL BIOPSY Right 2015   neg  . BREAST EXCISIONAL BIOPSY Left 2010   benign papilloma removed   . BREAST EXCISIONAL BIOPSY Left 1975   neg  . BREAST SURGERY  1975, 2010   benign breast tumor  . breat excision  1975   benign breast tumor excision  . CATARACT EXTRACTION W/PHACO Right 07/15/2016   Procedure: CATARACT EXTRACTION PHACO AND INTRAOCULAR LENS PLACEMENT (IOC);  Surgeon: Estill Cotta, MD;  Location: [facility];  Service: Ophthalmology;  Laterality: Right;  Lot # [id number] H [place]: 01:38.6 AP%: 25.0 CDE: 46.89  . CATARACT EXTRACTION W/PHACO Left 09/02/2016   Procedure: CATARACT EXTRACTION PHACO AND INTRAOCULAR LENS PLACEMENT (IOC);  Surgeon: Estill Cotta, MD;  Location: [facility];  Service: Ophthalmology;  Laterality: Left;  [place] 2:05.3 AP% 24.9 CDE 51.84 Fluid pack lot # [id number]  . cervial polyps  1999  . [place]  . COLONOSCOPY    . CYSTECTOMY  1962   vaginal cyst removal  . DILATION AND CURETTAGE OF UTERUS  1984  . ESOPHAGOGASTRODUODENOSCOPY    . granuloma  2002   chin granuloma removed  . [place], 2002, 2004   prosthesis, condyles implants, fossa replaced  . REVISION TOTAL KNEE ARTHROPLASTY    .  SIGMOIDOSCOPY  1993  . SQUAMOUS CELL CARCINOMA EXCISION  2008   left ankle/skin graft  . [place]  . THYROIDECTOMY  2006   left  . TONSILLECTOMY AND ADENOIDECTOMY  1944  . TOTAL HIP ARTHROPLASTY Left 09/12/2013   Procedure: LEFT TOTAL HIP ARTHROPLASTY ANTERIOR APPROACH;  Surgeon: [name], MD;  Location: [facility];  Service: Orthopedics;  Laterality: Left;  . TOTAL HIP ARTHROPLASTY Right 04/18/2019   Procedure: RIGHT TOTAL HIP ARTHROPLASTY ANTERIOR APPROACH;  Surgeon: Melrose Nakayama, MD;  [facility];  Service: Orthopedics;  Laterality: Right;  . TOTAL HIP ARTHROPLASTY Right 08/15/2019   Procedure: TOTAL HIP ARTHROPLASTY ANTERIOR APPROACH;  Surgeon: Melrose Nakayama, MD;  [facility];  Service: Orthopedics;  Laterality: Right;  . TRAPEZIUM RESECTION  2000, 2004   removal  right & left hand trapezium  . TUBAL LIGATION    . uterine polyps  2008  . vaginal cyst removed  1962    There were no vitals filed for this visit.   Subjective Assessment - 12/17/20 1351    Subjective Pt reports her shoulder is doing much better. Reports only 1/10 pain today. Reports compliance with HEP, with good carry over of strengthening therex.    Pertinent History Pt is a 85 year old female familiar with this clinic, d/c for L knee pain May 2021. Reports she has had L shoulder pain over the past couple years, but it has worsened over the past couple months. Has had multiple cortisone shots in her L shoulder, last one was 2 months ago with pain relief for a few weeks.Pain is sharp in nature, at posterior shoulder at lateral scapula border. Does not radiate to arm or neck, denies numbness tingling/pins and needles. Worst pain over past week 8/10; best 1/10. Pain with reaching overhead, across the body, and behind the back. Pain with any overhead lifting or activities, and has pain following pushing/pulling activities such as mopping, pushing WC for husband (occassionally), vacuming; and following sleeping on her R side when her L shoulder is IR'd.  Pt is retired, enjoys doing yoga and kniting. She lives at home with husband, drive, complete all community errands, ind with all ADLs. Pt denies N/V, B&B changes, unexplained weight fluctuation, saddle paresthesia, fever, night sweats, or unrelenting night pain at this time. No falls in past 6 months.    Limitations Lifting;Walking;House hold activities    How long can you sit comfortably? unlimited    How long can you stand comfortably? unlimited    How long can you walk comfortably? unlimited    Diagnostic tests Xrays last year reports only OA seen    Patient Stated Goals Reduce pain             Ther-Ex  Seated Shoulder Flexion AAROM with Pulley x15 3sec hold Seated Shoulder Abduction AAROM with Pulley x15 3sec hold Standing ER and IR  with RTB 2x 10  With min cuing for eccentric control and towel at elbow with good carry over Overhead flex 2# 3x 10/9/8 with min cuing for scapulohumeral rhythm [employer] 2# 3x 8 with chair for TC of scapular retraction, video to demo technique without L protraction with decent carry over (video demo used to help with understanding for this Lat pulldowns 25# x6 20# 2x 8 with min cuing for proper technique initially with good carry over  PT Education - 12/17/20 1400    Education Details therex form/technique    Person(s) Educated Patient    Methods Explanation;Demonstration;Verbal cues    Comprehension Verbalized understanding;Returned demonstration;Verbal cues required            PT Short Term Goals - 12/06/20 0953      PT SHORT TERM GOAL #1   Title Pt will be independent with HEP in order to improve strength and decrease pain in order to improve pain-free function at home and work.    Baseline 10/08/20 HEP given; 12/05/20 HEP update    Time 4    Period Weeks    Status Revised             PT Long Term Goals - 12/06/20 0954      PT LONG TERM GOAL #1   Title Patient will increase FOTO score to 64 to demonstrate predicted increase in functional mobility to complete ADLs    Baseline 10/08/20 51    Time 8    Period Weeks    Status Revised      PT LONG TERM GOAL #2   Title Pt will decrease worst pain as reported on NPRS by at least 3 points in order to demonstrate clinically significant reduction in pain.    Baseline 10/09/19 8/10; 12/06/20 6/10    Time 8    Period Weeks    Status Revised      PT LONG TERM GOAL #3   Title Patient will demonstrate at least 4/5 shoulder and periscapular strength in order to complete heavy household ADLs    Baseline 10/08/20 shoulder flex: 2+; abd 2+; ER 4- ; IR 3+ ; Y 2- ; T 3-; 12/06/20 shoulder flex: 4-; abd 3-; ER 4- ; IR 4- ; Y 2- ; T 3-    Time 8    Period Weeks    Status Revised      PT  LONG TERM GOAL #4   Title Patient will demonstrate full, pain free active shoulder ROM in order to complete overhead reaching for ADLs, and ind bathing and dressing    Baseline 10/08/20 flex: 114; abd 93; IR T12 with pain; 12/06/20 flex: 135; abd 128; IR T10 with pain    Time 8    Period Weeks    Status Revised                 Plan - 12/17/20 1408    Clinical Impression Statement PT continued therex progression for increased scapulohumeral strength and rhythm with success. Patient demonstrated excellent gains in AROM and AAROM this session allowing for increased strengthening progression. Paitent is motivated throughout session and is able to comply with all cuing for proper technique of therex. Pt with better carry over of efficient scapulohumeral rhythm following video demonstration. PT will continue progression as able.    Personal Factors and Comorbidities Comorbidity 1;Comorbidity 2;Comorbidity 3+;Age;Fitness;Past/Current Experience;Sex    Comorbidities bilat neuropathy, arthritis, anemia, history of cancer    Examination-Activity Limitations Lift;Reach Overhead;Carry;Bathing;Dressing;Hygiene/Grooming;Sleep    Examination-Participation Restrictions Meal Prep;Community Activity;Cleaning;Laundry;Yard Work;Driving    Stability/Clinical Decision Making Evolving/Moderate complexity    Clinical Decision Making Moderate    Rehab Potential Good    PT Frequency 2x / week    PT Duration 8 weeks    PT Treatment/Interventions ADLs/Self Care Home Management;Cryotherapy;Ultrasound;Stair training;Balance training;Dry needling;Moist Heat;Traction;Gait training;Therapeutic exercise;Patient/family education;Manual techniques;Passive range of motion;Electrical Stimulation;Functional mobility training;Therapeutic activities;Neuromuscular re-education;Energy conservation;Joint Manipulations;Spinal Manipulations    PT Next Visit Plan HEP review  PT Home Exercise Plan pulleys abd/flex, towel IR AAROM,  scapular retractions, standing ER with RTB anchored, standing IR with RTB anchored    Consulted and Agree with Plan of Care Patient           Patient will benefit from skilled therapeutic intervention in order to improve the following deficits and impairments:  Decreased endurance,Decreased mobility,Decreased range of motion,Improper body mechanics,Pain,Postural dysfunction,Impaired flexibility,Increased fascial restricitons,Decreased strength,Decreased coordination,Decreased activity tolerance,Increased muscle spasms  Visit Diagnosis: Stiffness of left shoulder, not elsewhere classified  Other abnormalities of gait and mobility     Problem List Patient Active Problem List   Diagnosis Date Noted  . Erosive osteoarthritis of both hands 09/11/2020  . Primary osteoarthritis, left shoulder 09/11/2020  . History of revision of total replacement of right hip joint 08/15/2019  . Primary localized osteoarthritis of right hip 04/18/2019  . Primary osteoarthritis of right hip 04/18/2019  . Pectus excavatum 10/12/2017  . 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 Perimenopausal atrophic vaginitis 09/22/2016  . Menopausal hot flushes 09/22/2016  . Mitral valve prolapse 01/13/2016  . Herniation of nucleus pulposus 05/01/2015  . Hypercholesteremia 05/01/2015  . Osteoarthritis of both knees 05/01/2015  . History of migraine headaches 05/01/2015  . Esophageal reflux 01/10/2015  . S/P total hip arthroplasty 09/12/2013   Durwin Reges DPT Durwin Reges 12/17/2020, 2:28 PM  [facility] [address], [place], [address] Phone: [phone]   Fax:  [phone]  Name: Wendy Patton MRN: [id number] Date of Birth: [birth year]

## 2020-12-19 ENCOUNTER — Other Ambulatory Visit: Payer: Self-pay

## 2020-12-19 ENCOUNTER — Ambulatory Visit: Payer: PPO | Admitting: Physical Therapy

## 2020-12-19 ENCOUNTER — Encounter: Payer: Self-pay | Admitting: Physical Therapy

## 2020-12-19 DIAGNOSIS — H6123 Impacted cerumen, bilateral: Secondary | ICD-10-CM | POA: Diagnosis not present

## 2020-12-19 DIAGNOSIS — M25612 Stiffness of left shoulder, not elsewhere classified: Secondary | ICD-10-CM | POA: Diagnosis not present

## 2020-12-19 DIAGNOSIS — R2689 Other abnormalities of gait and mobility: Secondary | ICD-10-CM

## 2020-12-19 DIAGNOSIS — H902 Conductive hearing loss, unspecified: Secondary | ICD-10-CM | POA: Diagnosis not present

## 2020-12-19 NOTE — Therapy (Signed)
[facility] [address] [place], [address] Phone: [phone]   Fax:  [phone]  Physical Therapy Treatment  Patient Details  Name: Wendy Patton MRN: [id number] Date of Birth: 1935/12/18 No data recorded  Encounter Date: 12/19/2020   PT End of Session - 12/19/20 1659    Visit Number 6    Number of Visits 17    Date for PT Re-Evaluation 12/06/20    Authorization - Visit Number 6    Authorization - Number of Visits 10    PT Start Time 1761    PT Stop Time 1730    PT Time Calculation (min) 45 min    Activity Tolerance Patient tolerated treatment well    Behavior During Therapy [facility] for tasks assessed/performed           Past Medical History:  Diagnosis Date  . Anemia    after jaw surgery was on iron for short period of time  . Arthritis   . Cancer (HCC)    squamous cell  . Difficult intubation    potential for difficult airway due to limited mouth opening  . Dry skin   . GERD (gastroesophageal reflux disease)    takes Aciphex daily  . Headache(784.0)    hx of migraines   . Heart murmur   . History of migraine    last one Aug 2014 and takes Imitrex daily as needed  . History of staph infection   . IBS (irritable bowel syndrome)   . Joint pain   . Joint swelling   . Laryngopharyngeal reflux   . MVP (mitral valve prolapse)   . Neuralgia   . Neuropathy    takes gabapentin daily  . PONV (postoperative nausea and vomiting)    TMJ surgery several times  . Rheumatic fever    as a child  . Shortness of breath    with exertion  . Thyroid disease     Past Surgical History:  Procedure Laterality Date  . ABDOMINAL HYSTERECTOMY  2009  . BREAST DUCTAL SYSTEM EXCISION Right 01/11/2014   Procedure: MAJOR DUCT EXCISION RIGHT BREAST;  Surgeon: Stark Klein, MD;  Location: [facility];  Service: General;  Laterality: Right;  . BREAST EXCISIONAL BIOPSY Right 2015   neg  . BREAST EXCISIONAL BIOPSY  Left 2010   benign papilloma removed  . BREAST EXCISIONAL BIOPSY Left 1975   neg  . BREAST SURGERY  1975, 2010   benign breast tumor  . breat excision  1975   benign breast tumor excision  . CATARACT EXTRACTION W/PHACO Right 07/15/2016   Procedure: CATARACT EXTRACTION PHACO AND INTRAOCULAR LENS PLACEMENT (IOC);  Surgeon: Estill Cotta, MD;  Location: [facility];  Service: Ophthalmology;  Laterality: Right;  Lot # [id number] H [place]: 01:38.6 AP%: 25.0 CDE: 46.89  . CATARACT EXTRACTION W/PHACO Left 09/02/2016   Procedure: CATARACT EXTRACTION PHACO AND INTRAOCULAR LENS PLACEMENT (IOC);  Surgeon: Estill Cotta, MD;  Location: [facility];  Service: Ophthalmology;  Laterality: Left;  [place] 2:05.3 AP% 24.9 CDE 51.84 Fluid pack lot # [id number]  . cervial polyps  1999  . [place]  . COLONOSCOPY    . CYSTECTOMY  1962   vaginal cyst removal  . DILATION AND CURETTAGE OF UTERUS  1984  . ESOPHAGOGASTRODUODENOSCOPY    . granuloma  2002   chin granuloma removed  . [place], 2002, 2004   prosthesis, condyles implants, fossa replaced  .  REVISION TOTAL KNEE ARTHROPLASTY    . SIGMOIDOSCOPY  1993  . SQUAMOUS CELL CARCINOMA EXCISION  2008   left ankle/skin graft  . [place]  . THYROIDECTOMY  2006   left  . TONSILLECTOMY AND ADENOIDECTOMY  1944  . TOTAL HIP ARTHROPLASTY Left 09/12/2013   Procedure: LEFT TOTAL HIP ARTHROPLASTY ANTERIOR APPROACH;  Surgeon: [name], MD;  Location: [facility];  Service: Orthopedics;  Laterality: Left;  . TOTAL HIP ARTHROPLASTY Right 04/18/2019   Procedure: RIGHT TOTAL HIP ARTHROPLASTY ANTERIOR APPROACH;  Surgeon: Melrose Nakayama, MD;  Location: [facility];  Service: Orthopedics;  Laterality: Right;  . TOTAL HIP ARTHROPLASTY Right 08/15/2019   Procedure: TOTAL HIP ARTHROPLASTY ANTERIOR APPROACH;  Surgeon: Melrose Nakayama, MD;  Location: [facility];  Service: Orthopedics;  Laterality: Right;  .  TRAPEZIUM RESECTION  2000, 2004   removal right & left hand trapezium  . TUBAL LIGATION    . uterine polyps  2008  . vaginal cyst removed  1962    There were no vitals filed for this visit.   Subjective Assessment - 12/19/20 1657    Subjective Pt reports she is doing very well, thinks she can d/c PT to robust HEP for continued strengthening. Just 1/10 pain today.    Pertinent History Pt is a 85 year old female familiar with this clinic, d/c for L knee pain May 2021. Reports she has had L shoulder pain over the past couple years, but it has worsened over the past couple months. Has had multiple cortisone shots in her L shoulder, last one was 2 months ago with pain relief for a few weeks.Pain is sharp in nature, at posterior shoulder at lateral scapula border. Does not radiate to arm or neck, denies numbness tingling/pins and needles. Worst pain over past week 8/10; best 1/10. Pain with reaching overhead, across the body, and behind the back. Pain with any overhead lifting or activities, and has pain following pushing/pulling activities such as mopping, pushing WC for husband (occassionally), vacuming; and following sleeping on her R side when her L shoulder is IR'd.  Pt is retired, enjoys doing yoga and kniting. She lives at home with husband, drive, complete all community errands, ind with all ADLs. Pt denies N/V, B&B changes, unexplained weight fluctuation, saddle paresthesia, fever, night sweats, or unrelenting night pain at this time. No falls in past 6 months.    Limitations Lifting;Walking;House hold activities    How long can you sit comfortably? unlimited    How long can you stand comfortably? unlimited    How long can you walk comfortably? unlimited    Diagnostic tests Xrays last year reports only OA seen    Patient Stated Goals Reduce pain    Pain Onset More than a month ago             Ther-Ex Seated Shoulder Flexion AAROM with Pulleyx15 3sec hold Seated Shoulder Abduction AAROM  with Pulleyx15 3sec hold  PT reviewed the following HEP with patient with patient able to demonstrate a set of the following with min cuing for correction needed. PT educated patient on parameters of therex (how/when to inc/decrease intensity, frequency, rep/set range, stretch hold time, and purpose of therex) with verbalized understanding.  Access Code: [email or web address] Shoulder External Rotation with Anchored Resistance - 1 x daily - 2-3 x weekly - 3 sets - 10 reps Standing Shoulder Internal Rotation with Anchored Resistance - 1 x daily - 2-3 x weekly - 3 sets - 10  reps Seated Overhead Press with Dumbbells - 1 x daily - 2-3 x weekly - 3 sets - 6-10 reps Seated Shoulder Flexion with Dumbbells - 1 x daily - 2-3 x weekly - 3 sets - 6-10 reps Seated Shoulder Scaption - 1 x daily - 2-3 x weekly - 3 sets - 10 reps                       PT Education - 12/19/20 1658    Education Details therex form/technique    Person(s) Educated Patient    Methods Explanation;Demonstration;Verbal cues    Comprehension Verbalized understanding;Returned demonstration;Verbal cues required            PT Short Term Goals - 12/06/20 0953      PT SHORT TERM GOAL #1   Title Pt will be independent with HEP in order to improve strength and decrease pain in order to improve pain-free function at home and work.    Baseline 10/08/20 HEP given; 12/05/20 HEP update    Time 4    Period Weeks    Status Revised             PT Long Term Goals - 12/19/20 1651      PT LONG TERM GOAL #1   Title Patient will increase FOTO score to 64 to demonstrate predicted increase in functional mobility to complete ADLs    Baseline 10/08/20 51; 12/19/20 70    Time 8    Period Weeks    Status Achieved      PT LONG TERM GOAL #2   Title Pt will decrease worst pain as reported on NPRS by at least 3 points in order to demonstrate clinically significant reduction in pain.    Baseline 10/09/19 8/10; 12/06/20 6/10; 12/19/20 1/10     Time 8    Period Weeks    Status Achieved      PT LONG TERM GOAL #3   Title Patient will demonstrate at least 4/5 shoulder and periscapular strength in order to complete heavy household ADLs    Baseline 10/08/20 shoulder flex: 2+; abd 2+; ER 4- ; IR 3+ ; Y 2- ; T 3-; 12/06/20 shoulder flex: 4-; abd 3-; ER 4- ; IR 4- ; Y 2- ; T 3-; 12/19/20 shoulder flex: 4+; abd 5; ER 4+ ; IR 5 ; Y 3+ ; T 4+;    Time 8    Period Weeks    Status Partially Met      PT LONG TERM GOAL #4   Title Patient will demonstrate full, pain free active shoulder ROM in order to complete overhead reaching for ADLs, and ind bathing and dressing    Baseline 10/08/20 flex: 114; abd 93; IR T12 with pain; 12/06/20 flex: 135; abd 128; IR T10 with pain; 12/19/20 all motions WNL    Time 8    Period Weeks    Status Achieved                 Plan - 12/19/20 1718    Clinical Impression Statement PT reassessed goals this session where patient has met all goals to safely d/c PT. Pt is able to demonstrate and verbalize understanding of all HEP recommendations with good carry over. Pt given printout of all education provided and clinic contact info should any further questions or concerns arise. Pt to d/c PT.    Personal Factors and Comorbidities Comorbidity 1;Comorbidity 2;Comorbidity 3+;Age;Fitness;Past/Current Experience;Sex    Comorbidities bilat neuropathy, arthritis,  anemia, history of cancer    Examination-Activity Limitations Lift;Reach Overhead;Carry;Bathing;Dressing;Hygiene/Grooming;Sleep    Examination-Participation Restrictions Meal Prep;Community Activity;Cleaning;Laundry;Yard Work;Driving    Stability/Clinical Decision Making Evolving/Moderate complexity    Clinical Decision Making Moderate    Rehab Potential Good    PT Frequency 2x / week    PT Duration 8 weeks    PT Treatment/Interventions ADLs/Self Care Home Management;Cryotherapy;Ultrasound;Stair training;Balance training;Dry needling;Moist Heat;Traction;Gait  training;Therapeutic exercise;Patient/family education;Manual techniques;Passive range of motion;Electrical Stimulation;Functional mobility training;Therapeutic activities;Neuromuscular re-education;Energy conservation;Joint Manipulations;Spinal Manipulations    PT Next Visit Plan HEP review    PT Home Exercise Plan pulleys abd/flex, towel IR AAROM, scapular retractions, standing ER with RTB anchored, standing IR with RTB anchored    Consulted and Agree with Plan of Care Patient           Patient will benefit from skilled therapeutic intervention in order to improve the following deficits and impairments:  Decreased endurance,Decreased mobility,Decreased range of motion,Improper body mechanics,Pain,Postural dysfunction,Impaired flexibility,Increased fascial restricitons,Decreased strength,Decreased coordination,Decreased activity tolerance,Increased muscle spasms  Visit Diagnosis: Stiffness of left shoulder, not elsewhere classified  Other abnormalities of gait and mobility     Problem List Patient Active Problem List   Diagnosis Date Noted  . Erosive osteoarthritis of both hands 09/11/2020  . Primary osteoarthritis, left shoulder 09/11/2020  . History of revision of total replacement of right hip joint 08/15/2019  . Primary localized osteoarthritis of right hip 04/18/2019  . Primary osteoarthritis of right hip 04/18/2019  . Pectus excavatum 10/12/2017  . 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 Perimenopausal atrophic vaginitis 09/22/2016  . Menopausal hot flushes 09/22/2016  . Mitral valve prolapse 01/13/2016  . Herniation of nucleus pulposus 05/01/2015  . Hypercholesteremia 05/01/2015  . Osteoarthritis of both knees 05/01/2015  . History of migraine headaches 05/01/2015  . Esophageal reflux 01/10/2015  . S/P total hip arthroplasty 09/12/2013   Durwin Reges DPT Durwin Reges 12/19/2020, 5:21 PM  [facility] [address] [place], [address] Phone: [phone]   Fax:  [phone]  Name: Wendy Patton MRN: [id number] Date of Birth: 02-06-1936

## 2020-12-24 ENCOUNTER — Ambulatory Visit: Payer: PPO | Admitting: Physical Therapy

## 2020-12-26 ENCOUNTER — Ambulatory Visit: Payer: PPO | Admitting: Physical Therapy

## 2020-12-31 ENCOUNTER — Telehealth: Payer: Self-pay

## 2020-12-31 DIAGNOSIS — E871 Hypo-osmolality and hyponatremia: Secondary | ICD-10-CM

## 2020-12-31 NOTE — Telephone Encounter (Signed)
Order CBC and BMP to recheck hyponatremia.

## 2020-12-31 NOTE — Telephone Encounter (Signed)
Copied from [place] [id number]. Topic: General - Inquiry >> Dec 31, 2020 12:52 PM Valere Dross wrote: Reason for CRM: Patient called in and stated she wanted to get follow up orders to check her sodium and chloride. Please advise

## 2020-12-31 NOTE — Telephone Encounter (Signed)
Please review. Ok to order?  

## 2021-01-01 ENCOUNTER — Ambulatory Visit: Payer: PPO | Admitting: Physical Therapy

## 2021-01-01 NOTE — Telephone Encounter (Signed)
Ordered as below. Left message advising that lab order was printed and at the front desk.

## 2021-01-02 DIAGNOSIS — E871 Hypo-osmolality and hyponatremia: Secondary | ICD-10-CM | POA: Diagnosis not present

## 2021-01-03 ENCOUNTER — Ambulatory Visit: Payer: PPO | Admitting: Physical Therapy

## 2021-01-03 LAB — BASIC METABOLIC PANEL
BUN/Creatinine Ratio: 18 (ref 12–28)
BUN: 18 mg/dL (ref 8–27)
CO2: 25 mmol/L (ref 20–29)
Calcium: 9.4 mg/dL (ref 8.7–10.3)
Chloride: 96 mmol/L (ref 96–106)
Creatinine, Ser: 1.02 mg/dL — ABNORMAL HIGH (ref 0.57–1.00)
Glucose: 92 mg/dL (ref 65–99)
Potassium: 4.9 mmol/L (ref 3.5–5.2)
Sodium: 132 mmol/L — ABNORMAL LOW (ref 134–144)
eGFR: 55 mL/min/{1.73_m2} — ABNORMAL LOW (ref >59)

## 2021-01-03 LAB — CBC WITH DIFFERENTIAL/PLATELET
Basophils Absolute: 0.1 10*3/uL (ref 0.0–0.2)
Basos: 1 %
EOS (ABSOLUTE): 0.1 10*3/uL (ref 0.0–0.4)
Eos: 3 %
Hematocrit: 36.3 % (ref 34.0–46.6)
Hemoglobin: 11.9 g/dL (ref 11.1–15.9)
Immature Grans (Abs): 0 10*3/uL (ref 0.0–0.1)
Immature Granulocytes: 0 %
Lymphocytes Absolute: 1.4 10*3/uL (ref 0.7–3.1)
Lymphs: 25 %
MCH: 28.1 pg (ref 26.6–33.0)
MCHC: 32.8 g/dL (ref 31.5–35.7)
MCV: 86 fL (ref 79–97)
Monocytes Absolute: 0.6 10*3/uL (ref 0.1–0.9)
Monocytes: 10 %
Neutrophils Absolute: 3.5 10*3/uL (ref 1.4–7.0)
Neutrophils: 61 %
Platelets: 255 10*3/uL (ref 150–450)
RBC: 4.24 x10E6/uL (ref 3.77–5.28)
RDW: 12.8 % (ref 11.7–15.4)
WBC: 5.7 10*3/uL (ref 3.4–10.8)

## 2021-01-09 ENCOUNTER — Telehealth: Payer: Self-pay

## 2021-01-09 DIAGNOSIS — I10 Essential (primary) hypertension: Secondary | ICD-10-CM

## 2021-01-09 MED ORDER — LOSARTAN POTASSIUM 25 MG PO TABS: 25.0000 mg | ORAL_TABLET | Freq: Every day | ORAL | 3 refills | Status: AC

## 2021-01-09 NOTE — Telephone Encounter (Signed)
[facility] is requesting refills on Losartan Potassium 25 mg. #90

## 2021-01-09 NOTE — Telephone Encounter (Signed)
Patient was last seen in office 11/29/2020 will refill for one year. KW

## 2021-01-20 NOTE — Addendum Note (Signed)
Addended by: Kelton Pillar on: 01/20/2021 01:44 PM   Modules accepted: Orders

## 2021-08-11 ENCOUNTER — Telehealth: Payer: Self-pay | Admitting: Family Medicine

## 2021-08-11 NOTE — Telephone Encounter (Signed)
Left message for patient to call back and schedule Medicare Annual Wellness Visit (AWV) in office.   If not able to come in office, please offer to do virtually or by telephone.   Last AWV:11/21/2019  Please schedule at anytime with [facility] Health Advisor.  If any questions, please contact me at [phone]

## 2021-11-11 ENCOUNTER — Telehealth: Payer: Self-pay | Admitting: Family Medicine

## 2021-11-11 NOTE — Telephone Encounter (Signed)
Copied from [place] [id number]. Topic: Medicare AWV ?>> Nov 11, 2021  3:08 PM Cher Nakai R wrote: ?Reason for CRM:  ?Left message for patient to call back and schedule Medicare Annual Wellness Visit (AWV) in office.  ? ?If unable to come into the office for AWV,  please offer to do virtually or by telephone. ? ?Last AWV:   11/21/2019 ? ?Please schedule at anytime with [place]. ? ?30 minute appointment for Virtual or phone ?45 minute appointment for in office or Initial virtual/phone ? ?Any questions, please contact me at [phone] ?

## 2021-12-04 ENCOUNTER — Telehealth: Payer: Self-pay | Admitting: Family Medicine

## 2021-12-04 NOTE — Telephone Encounter (Signed)
Copied from [place] [id number]. Topic: Medicare AWV ?>> Dec 04, 2021  8:40 AM Cher Nakai R wrote: ?Reason for CRM:  ?Left message for patient to call back and schedule Medicare Annual Wellness Visit (AWV) in office.  ? ?If unable to come into the office for AWV,  please offer to do virtually or by telephone. ? ?Last AWV:  11/21/2019 ? ?Please schedule at anytime with [facility] Health Advisor. ? ?30 minute appointment for Virtual or phone ?45 minute appointment for in office or Initial virtual/phone ? ?Any questions, please contact me at [phone] ?
